# Patient Record
Sex: Male | Born: 1980
Health system: Southern US, Community
[De-identification: ages and names within clinical notes are randomized; demographics above are authoritative.]

## PROBLEM LIST (undated history)

## (undated) DIAGNOSIS — F1910 Other psychoactive substance abuse, uncomplicated: Secondary | ICD-10-CM

## (undated) DIAGNOSIS — F1020 Alcohol dependence, uncomplicated: Secondary | ICD-10-CM

## (undated) DIAGNOSIS — I10 Essential (primary) hypertension: Secondary | ICD-10-CM

## (undated) HISTORY — PX: DENTAL SURGERY: SHX609

---

## 2000-10-13 ENCOUNTER — Emergency Department (HOSPITAL_COMMUNITY): Admission: EM | Admit: 2000-10-13 | Discharge: 2000-10-13 | Payer: Self-pay | Admitting: Emergency Medicine

## 2008-11-02 ENCOUNTER — Emergency Department (HOSPITAL_COMMUNITY): Admission: EM | Admit: 2008-11-02 | Discharge: 2008-11-03 | Payer: Self-pay | Admitting: Emergency Medicine

## 2011-10-31 ENCOUNTER — Emergency Department (HOSPITAL_COMMUNITY)
Admission: EM | Admit: 2011-10-31 | Discharge: 2011-10-31 | Disposition: A | Discharge status: Home / Self Care | Payer: Self-pay | Attending: Emergency Medicine | Admitting: Emergency Medicine

## 2011-10-31 ENCOUNTER — Encounter (HOSPITAL_COMMUNITY): Payer: Self-pay | Admitting: Emergency Medicine

## 2011-10-31 DIAGNOSIS — Z23 Encounter for immunization: Secondary | ICD-10-CM | POA: Insufficient documentation

## 2011-10-31 DIAGNOSIS — Y93H2 Activity, gardening and landscaping: Secondary | ICD-10-CM | POA: Insufficient documentation

## 2011-10-31 DIAGNOSIS — S51809A Unspecified open wound of unspecified forearm, initial encounter: Secondary | ICD-10-CM | POA: Insufficient documentation

## 2011-10-31 DIAGNOSIS — W278XXA Contact with other nonpowered hand tool, initial encounter: Secondary | ICD-10-CM | POA: Insufficient documentation

## 2011-10-31 DIAGNOSIS — S51811A Laceration without foreign body of right forearm, initial encounter: Secondary | ICD-10-CM

## 2011-10-31 HISTORY — DX: Other psychoactive substance abuse, uncomplicated: F19.10

## 2011-10-31 MED ORDER — TETANUS-DIPHTH-ACELL PERTUSSIS 5-2.5-18.5 LF-MCG/0.5 IM SUSP
0.5000 mL | Freq: Once | INTRAMUSCULAR | Status: AC
  Administered 2011-10-31: 0.5 mL via INTRAMUSCULAR
  Filled 2011-10-31: qty 0.5

## 2011-10-31 NOTE — ED Provider Notes (Signed)
History     CSN: [id number]  Arrival date & time 10/31/11  1259   First MD Initiated Contact with Patient 10/31/11 1313      Chief Complaint  Patient presents with  . Extremity Laceration    (Consider location/radiation/quality/duration/timing/severity/associated sxs/prior treatment) HPI Comments: Patient presents with an extremity laceration of right forearm. 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 The sheers slipped out of is hand and cut his right forearm. The patient reports about 100cc of blood loss and mild pain to the area. He denies numbness/tingling, weakness or right arm. He denies lightheadedness, SOB, chest pain. The bleeding has stopped at this point and a pressure dressing was applied after the injury.    Past Medical History  Diagnosis Date  . Substance abuse     History reviewed. No pertinent past surgical history.  No family history on file.  History  Substance Use Topics  . Smoking status: Current Everyday Smoker  . Smokeless tobacco: Not on file  . Alcohol Use: No      Review of Systems  Skin: Positive for wound.  All other systems reviewed and are negative.    Allergies  Review of patient's allergies indicates no known allergies.  Home Medications  No current outpatient prescriptions on file.  BP 130/71  Pulse 81  Temp 99.4 F (37.4 C) (Oral)  Resp 16  Ht 5\' 10"  (1.778 m)  Wt 155 lb (70.308 kg)  BMI 22.24 kg/m2  SpO2 95%  Physical Exam  Nursing note and vitals reviewed. Constitutional: He is oriented to person, place, and time. He appears well-developed and well-nourished. No distress.  HENT:  Head: Normocephalic and atraumatic.  Eyes: Conjunctivae are normal. No scleral icterus.  Neck: Normal range of motion. Neck supple.  Cardiovascular: Normal rate and regular rhythm.   Pulmonary/Chest: Effort normal and breath sounds normal.  Musculoskeletal: Normal range of motion.  Neurological: He is alert and  oriented to person, place, and time. Coordination normal.       Upper extremity sensation and strength equal and intact bilaterally.   Skin: Skin is warm and dry. He is not diaphoretic.       1cm laceration of right volar aspect of forearm just distal to antecubital area.   Psychiatric: He has a normal mood and affect. His behavior is normal.    ED Course  Procedures (including critical care time)  LACERATION REPAIR Performed by: Emilia Beck Authorized by: Emilia Beck Consent: Verbal consent obtained. Risks and benefits: risks, benefits and alternatives were discussed Consent given by: patient Patient identity confirmed: provided demographic data Prepped and Draped in normal sterile fashion Wound explored  Laceration Location: right volar aspect of forearm, just distal to antecubital fossae  Laceration Length: 1.0 cm  No Foreign Bodies seen or palpated  Anesthesia: local infiltration  Local anesthetic: lidocaine 2% with epinephrine  Anesthetic total: 3.0 ml  Irrigation method: syringe Amount of cleaning: standard  Skin closure: 4.0 prolene  Number of sutures: 2  Technique: simple  Patient tolerance: Patient tolerated the procedure well with no immediate complications.   Labs Reviewed - No data to display No results found.   No diagnosis found.    MDM  3:17 PM Patient sutured without complications. He can be discharged after a tdap shot. He should return in 7 days for suture removal or sooner if signs of infection. No further evaluation needed at this time.         Emilia Beck, PA-C 11/02/11  2322 

## 2011-10-31 NOTE — ED Notes (Signed)
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  EMS reports a 2" lac on right forearm. Pt arrived with pressure dressing in place by EMS. EMS reports pt lost 100cc of blood.  Bleeding is controlled.  Pt denies numbness and tingling.  Pt reports pain 2/10.

## 2011-11-03 NOTE — ED Provider Notes (Signed)
Medical screening examination/treatment/procedure(s) were performed by non-physician practitioner and as supervising physician I was immediately available for consultation/collaboration. Devoria Albe, MD, Armando Gang   Ward Givens, MD 11/03/11 1501

## 2015-01-11 ENCOUNTER — Emergency Department (HOSPITAL_COMMUNITY)
Admission: EM | Admit: 2015-01-11 | Discharge: 2015-01-11 | Disposition: A | Discharge status: Home / Self Care | Payer: Self-pay | Attending: Emergency Medicine | Admitting: Emergency Medicine

## 2015-01-11 ENCOUNTER — Encounter (HOSPITAL_COMMUNITY): Payer: Self-pay

## 2015-01-11 DIAGNOSIS — F17210 Nicotine dependence, cigarettes, uncomplicated: Secondary | ICD-10-CM | POA: Insufficient documentation

## 2015-01-11 DIAGNOSIS — G5601 Carpal tunnel syndrome, right upper limb: Secondary | ICD-10-CM | POA: Insufficient documentation

## 2015-01-11 MED ORDER — IBUPROFEN 600 MG PO TABS: 600.0000 mg | ORAL_TABLET | Freq: Four times a day (QID) | ORAL | Status: DC | PRN

## 2015-01-11 NOTE — Discharge Instructions (Signed)

## 2015-01-11 NOTE — ED Provider Notes (Addendum)
CSN: [id number]     Arrival date & time 01/11/15  1735 History   First MD Initiated Contact with Patient 01/11/15 2037     Chief Complaint  Patient presents with  . Numbness     (Consider location/radiation/quality/duration/timing/severity/associated sxs/prior Treatment) The history is provided by the patient.   patient presents with pain and tingling in his right palm. 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 Over the last week she's been having more the numbness. Is on the palms hand and also some first and second finger. Feels like he slept on it. Worse in the morning. Has been getting worse last couple days. No other trauma. No neck pain. No weakness.  Past Medical History  Diagnosis Date  . Substance abuse    History reviewed. No pertinent past surgical history. History reviewed. No pertinent family history. Social History  Substance Use Topics  . Smoking status: Current Every Day Smoker -- 0.50 packs/day    Types: Cigarettes  . Smokeless tobacco: None  . Alcohol Use: Yes     Comment: occ    Review of Systems  Constitutional: Negative for fever.  Musculoskeletal: Negative for back pain and neck pain.  Neurological: Positive for numbness. Negative for weakness and light-headedness.      Allergies  Review of patient's allergies indicates no known allergies.  Home Medications   Prior to Admission medications   Medication Sig Start Date End Date Taking? Authorizing Provider  ibuprofen (ADVIL,MOTRIN) 600 MG tablet Take 1 tablet (600 mg total) by mouth every 6 (six) hours as needed. 01/11/15   Benjiman CoreNathan Zhyon Antenucci, MD   BP 180/110 mmHg  Pulse 92  Temp(Src) 98.7 F (37.1 C) (Oral)  Resp 16  SpO2 98% Physical Exam  Constitutional: He appears well-developed.  Musculoskeletal:   Thumb second and third finger. Worse with flexion at the wrist. Good strength. Otherwise strength intact on radial median and ulnar nerve distribution. No tenderness or elbow. No tenderness over neck.   Neurological: He is alert.  Skin: Skin is warm.    ED Course  Procedures (including critical care time) Labs Review Labs Reviewed - No data to display  Imaging Review No results found. I have personally reviewed and evaluated these images and lab results as part of my medical decision-making.   EKG Interpretation None      MDM   Final diagnoses:  Carpal tunnel syndrome on right    Patient with numbness in the hand. Likely carpal tunnel. We'll give splint and anti-inflammatory. Will have follow-up with Dr. Melvyn Novasrtmann or Dr. Asencion GowdaGramig  Lunna Vogelgesang, MD 01/11/15 2121  Patient now also has hypertension. No history of this. Will follow-up with primary care doctor. Doubt end organ damage.  Benjiman CoreNathan Sequoia Mincey, MD 01/11/15 2138

## 2015-01-11 NOTE — ED Notes (Signed)
Pt c/o R thumb, index, and middle finger numbness x 3 days.  Pain score 5/10.  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

## 2016-10-10 ENCOUNTER — Emergency Department (HOSPITAL_COMMUNITY): Payer: Self-pay

## 2016-10-10 ENCOUNTER — Encounter (HOSPITAL_COMMUNITY): Payer: Self-pay

## 2016-10-10 ENCOUNTER — Emergency Department (HOSPITAL_COMMUNITY)
Admission: EM | Admit: 2016-10-10 | Discharge: 2016-10-11 | Disposition: A | Discharge status: Home / Self Care | Payer: Self-pay | Attending: Emergency Medicine | Admitting: Emergency Medicine

## 2016-10-10 DIAGNOSIS — Z791 Long term (current) use of non-steroidal anti-inflammatories (NSAID): Secondary | ICD-10-CM | POA: Insufficient documentation

## 2016-10-10 DIAGNOSIS — Z79899 Other long term (current) drug therapy: Secondary | ICD-10-CM | POA: Insufficient documentation

## 2016-10-10 DIAGNOSIS — F17210 Nicotine dependence, cigarettes, uncomplicated: Secondary | ICD-10-CM | POA: Insufficient documentation

## 2016-10-10 DIAGNOSIS — I10 Essential (primary) hypertension: Secondary | ICD-10-CM | POA: Insufficient documentation

## 2016-10-10 DIAGNOSIS — R1013 Epigastric pain: Secondary | ICD-10-CM | POA: Insufficient documentation

## 2016-10-10 HISTORY — DX: Alcohol dependence, uncomplicated: F10.20

## 2016-10-10 LAB — BASIC METABOLIC PANEL
Anion gap: 14 (ref 5–15)
BUN: 5 mg/dL — ABNORMAL LOW (ref 6–20)
CO2: 25 mmol/L (ref 22–32)
Calcium: 9.1 mg/dL (ref 8.9–10.3)
Chloride: 97 mmol/L — ABNORMAL LOW (ref 101–111)
Creatinine, Ser: 0.93 mg/dL (ref 0.61–1.24)
GFR calc Af Amer: >60 mL/min (ref >60)
GFR calc non Af Amer: >60 mL/min (ref >60)
Glucose, Bld: 149 mg/dL — ABNORMAL HIGH (ref 65–99)
Potassium: 3.4 mmol/L — ABNORMAL LOW (ref 3.5–5.1)
Sodium: 136 mmol/L (ref 135–145)

## 2016-10-10 LAB — CBC
HCT: 45.2 % (ref 39.0–52.0)
Hemoglobin: 15.6 g/dL (ref 13.0–17.0)
MCH: 32.6 pg (ref 26.0–34.0)
MCHC: 34.5 g/dL (ref 30.0–36.0)
MCV: 94.4 fL (ref 78.0–100.0)
Platelets: 265 10*3/uL (ref 150–400)
RBC: 4.79 MIL/uL (ref 4.22–5.81)
RDW: 13.7 % (ref 11.5–15.5)
WBC: 14.8 10*3/uL — ABNORMAL HIGH (ref 4.0–10.5)

## 2016-10-10 LAB — LIPASE, BLOOD: Lipase: 46 U/L (ref 11–51)

## 2016-10-10 LAB — URINALYSIS, ROUTINE W REFLEX MICROSCOPIC
BACTERIA UA: NONE SEEN
BILIRUBIN URINE: NEGATIVE
Glucose, UA: NEGATIVE mg/dL
HGB URINE DIPSTICK: NEGATIVE
Ketones, ur: NEGATIVE mg/dL
NITRITE: NEGATIVE
Protein, ur: NEGATIVE mg/dL
SPECIFIC GRAVITY, URINE: 1.011 (ref 1.005–1.030)
pH: 6 (ref 5.0–8.0)

## 2016-10-10 LAB — I-STAT TROPONIN, ED: Troponin i, poc: 0 ng/mL (ref 0.00–0.08)

## 2016-10-10 LAB — HEPATIC FUNCTION PANEL
ALT: 89 U/L — ABNORMAL HIGH (ref 17–63)
AST: 124 U/L — ABNORMAL HIGH (ref 15–41)
Albumin: 4 g/dL (ref 3.5–5.0)
Alkaline Phosphatase: 74 U/L (ref 38–126)
BILIRUBIN DIRECT: 0.3 mg/dL (ref 0.1–0.5)
BILIRUBIN INDIRECT: 0.8 mg/dL (ref 0.3–0.9)
Total Bilirubin: 1.1 mg/dL (ref 0.3–1.2)
Total Protein: 7.8 g/dL (ref 6.5–8.1)

## 2016-10-10 LAB — ETHANOL

## 2016-10-10 LAB — RAPID URINE DRUG SCREEN, HOSP PERFORMED
Amphetamines: NOT DETECTED
Barbiturates: NOT DETECTED
Benzodiazepines: NOT DETECTED
COCAINE: NOT DETECTED
OPIATES: NOT DETECTED
TETRAHYDROCANNABINOL: NOT DETECTED

## 2016-10-10 IMAGING — DX DG CHEST 2V
2 series · 2 of 2 positions shown · non-contrast
Comparison: None.

CLINICAL DATA: Chest pain

EXAM:
CHEST  2 VIEW

[w chest pa]
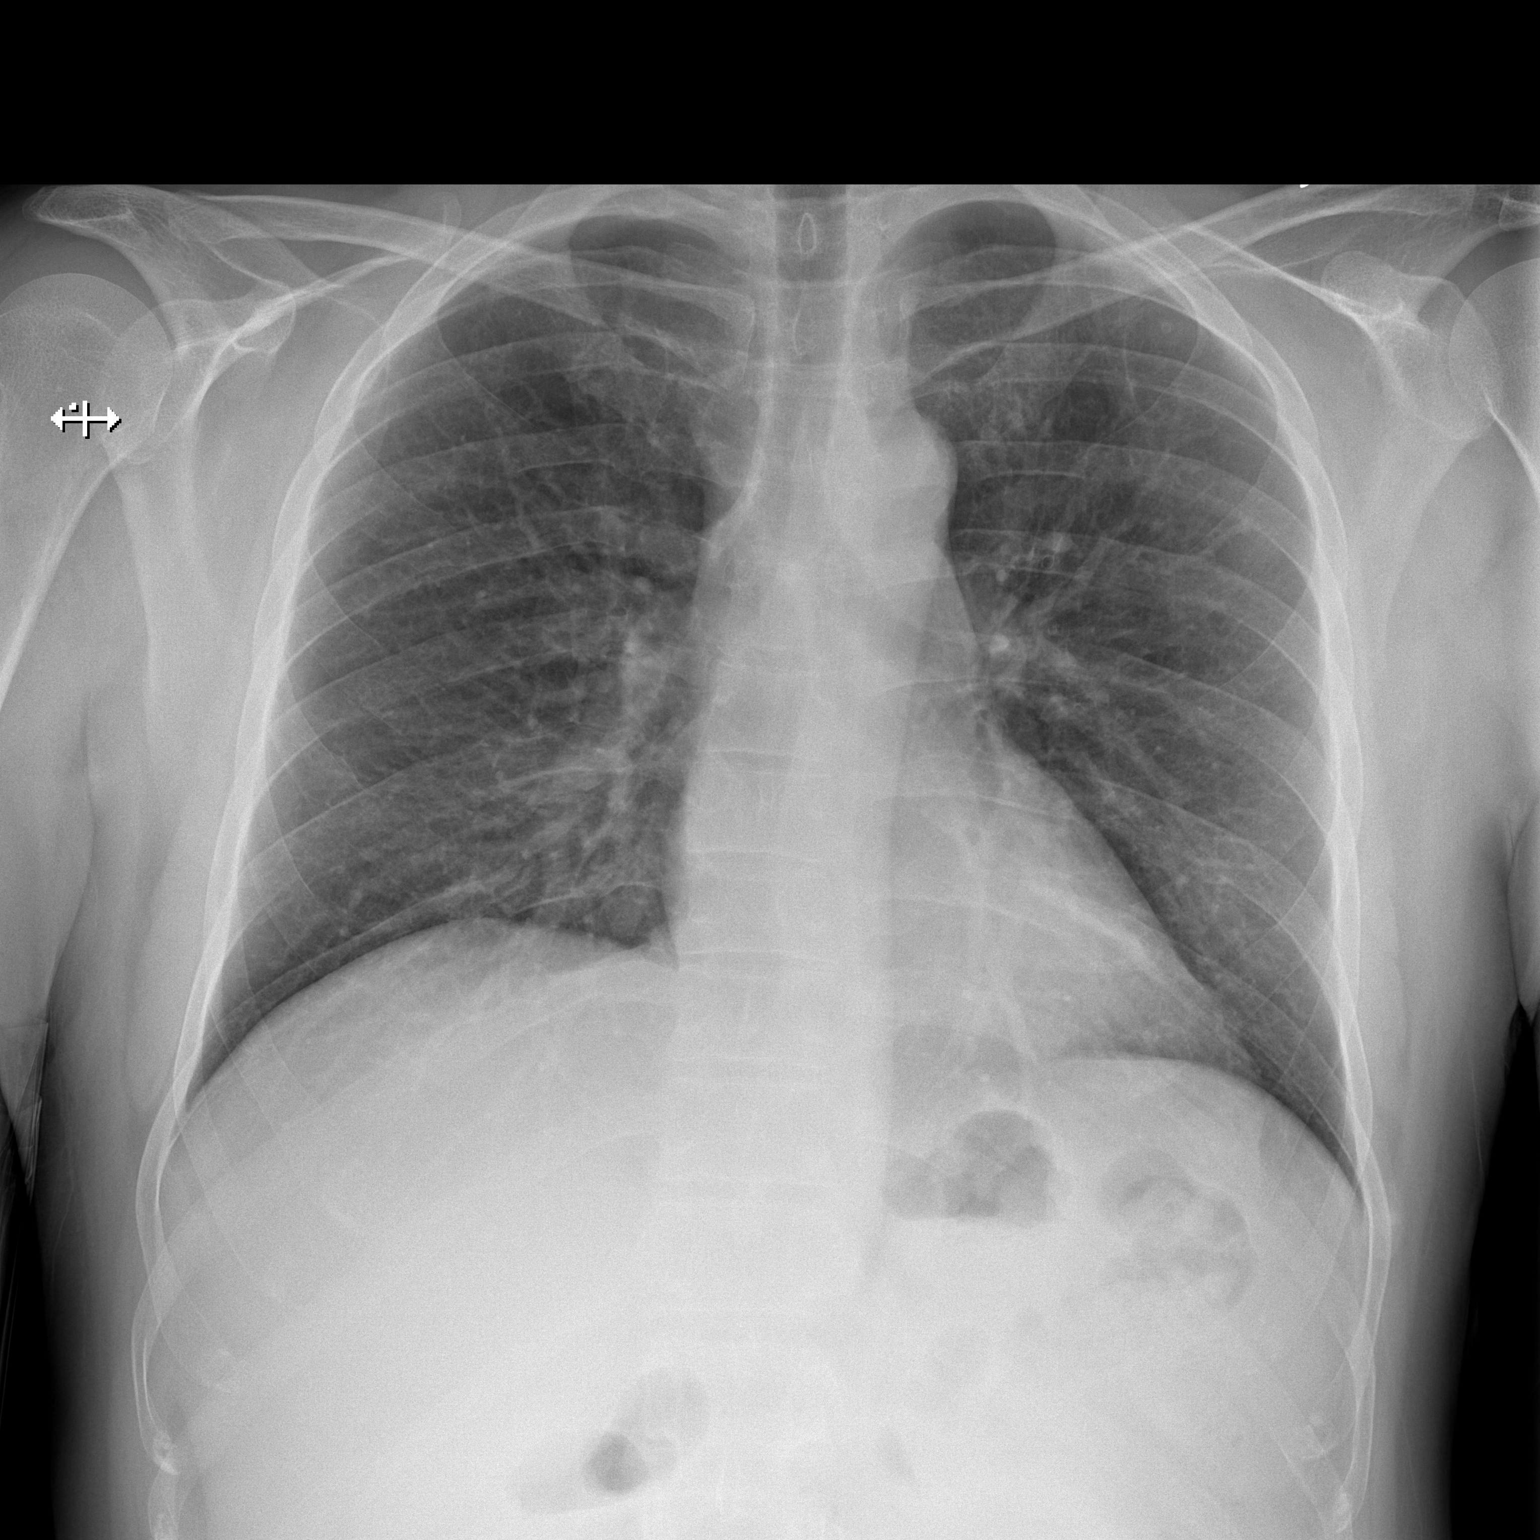

[w chest lat]
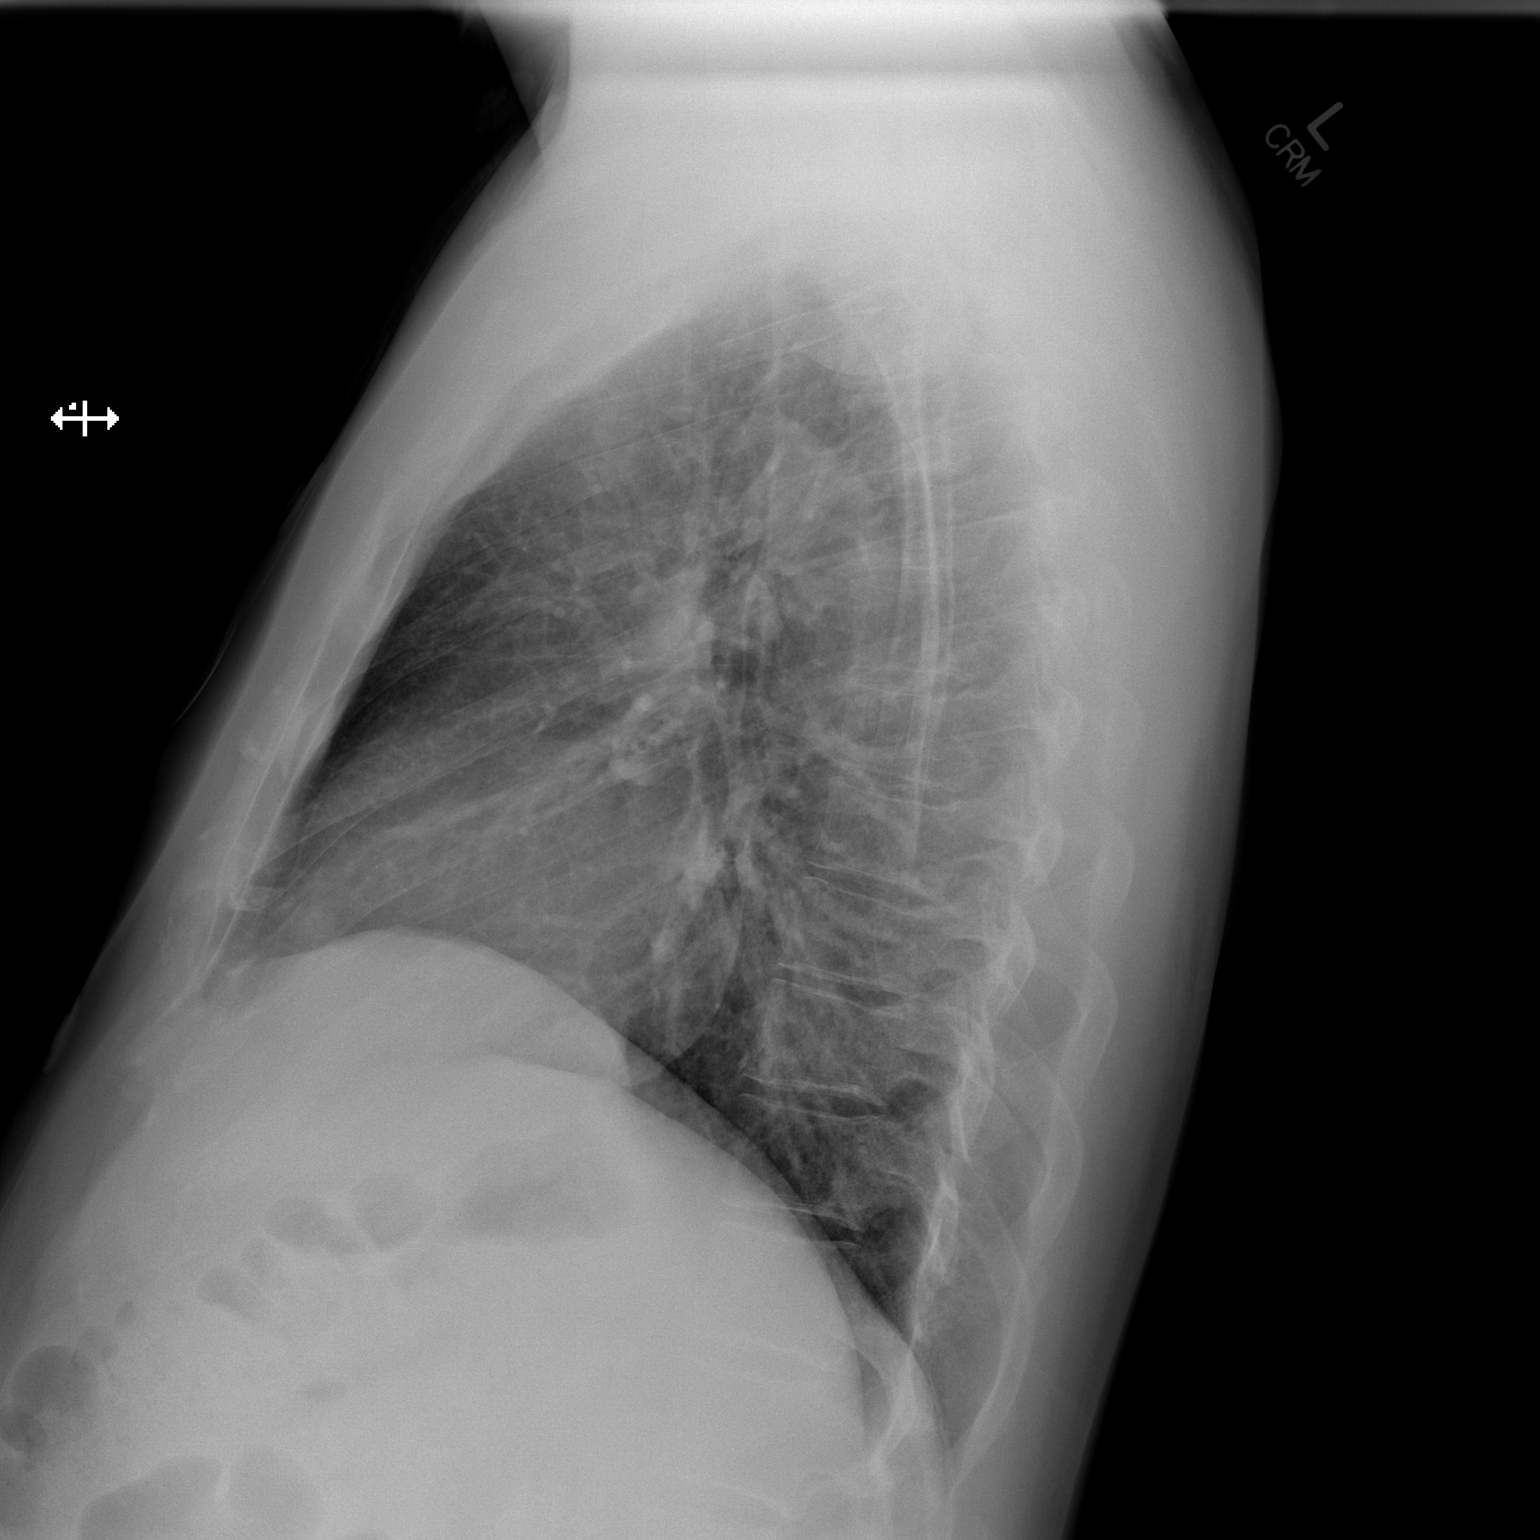

[2 of 2 positions shown; findings below may reference images not displayed]

FINDINGS: The heart size and mediastinal contours are within normal limits.
Both lungs are clear. The visualized skeletal structures are
unremarkable.
IMPRESSION: No active cardiopulmonary disease.

## 2016-10-10 MED ORDER — THIAMINE HCL 100 MG/ML IJ SOLN: 100.0000 mg | Freq: Every day | INTRAMUSCULAR | Status: DC

## 2016-10-10 MED ORDER — LORAZEPAM 1 MG PO TABS
0.0000 mg | ORAL_TABLET | Freq: Four times a day (QID) | ORAL | Status: DC
Start: 2016-10-10 — End: 2016-10-11
  Administered 2016-10-10: 1 mg via ORAL
  Filled 2016-10-10: qty 1

## 2016-10-10 MED ORDER — LORAZEPAM 2 MG/ML IJ SOLN: 0.0000 mg | Freq: Four times a day (QID) | INTRAMUSCULAR | Status: DC

## 2016-10-10 MED ORDER — HYDROCHLOROTHIAZIDE 25 MG PO TABS
25.0000 mg | ORAL_TABLET | Freq: Every day | ORAL | Status: DC
  Administered 2016-10-10: 25 mg via ORAL
  Filled 2016-10-10: qty 1

## 2016-10-10 MED ORDER — VITAMIN B-1 100 MG PO TABS
100.0000 mg | ORAL_TABLET | Freq: Every day | ORAL | Status: DC
  Administered 2016-10-11: 100 mg via ORAL

## 2016-10-10 MED ORDER — LORAZEPAM 2 MG/ML IJ SOLN: 0.0000 mg | Freq: Two times a day (BID) | INTRAMUSCULAR | Status: DC

## 2016-10-10 MED ORDER — LORAZEPAM 1 MG PO TABS: 0.0000 mg | ORAL_TABLET | Freq: Two times a day (BID) | ORAL | Status: DC

## 2016-10-10 NOTE — ED Triage Notes (Signed)
Onset yesterday chest pain, tingling, high blood pressure.  Pt drinks about fifth liquor a day, has not had anything to drink today.  On Methadone, last dose this morning.

## 2016-10-10 NOTE — ED Provider Notes (Signed)
[facility]-EMERGENCY DEPT Provider Note   CSN: [id number] Arrival date & time: 10/10/16  1409     History   Chief Complaint Chief Complaint  Patient presents with  . Chest Pain    HPI Peter [name] is a 36 y.o. male.  HPI   Peter [name] is a 36 y.o. male, with a history of Alcohol and substance abuse, presenting to the ED with an episode of lower chest pain/upper abdominal pain yesterday. Was driving yesterday and began to feel lightheaded, hot, and diaphoretic. Endorses upper abdominal pain that came on at the same time. Pain was dull and intermittent. Nausea and 2-3 episodes of vomiting. States this happens when he is withdrawing. Also took his blood pressure at home and found it to be high. Currently has no pain or complaints other than feeling "shaky." Usually drinks between a pint and a fifth of whiskey daily. Last alcohol was yesterday at 6 pm and this alleviated the above symptoms. Denies illicit drug use. Also takes 115mg  methadone daily with last dose this morning. Patient is accompanied by his father at the bedside.  Denies headache, fever/chills, shortness of breath, seizures, Hematemesis, hematochezia/melena, confusion, SI/HI, hallucinations, or any other complaints.   Past Medical History:  Diagnosis Date  . Alcoholism (HCC)   . Substance abuse     There are no active problems to display for this patient.   History reviewed. No pertinent surgical history.     Home Medications    Prior to Admission medications   Medication Sig Start Date End Date Taking? Authorizing Provider  DiphenhydrAMINE HCl, Sleep, (SLEEPING PO) Take 1 tablet by mouth at bedtime as needed (sleep).   Yes [provider]  ibuprofen (ADVIL,MOTRIN) 600 MG tablet Take 1 tablet (600 mg total) by mouth every 6 (six) hours as needed. 01/11/15  Yes Benjiman Core, MD  methadone (DOLOPHINE) 0.4 mg/mL SOLN Take 115 mg by mouth daily.   Yes [provider]  chlordiazePOXIDE  (LIBRIUM) 25 MG capsule 50mg  PO TID x 1D, then 25-50mg  PO BID X 1D, then 25-50mg  PO QD X 1D 10/11/16   Joy, Shawn C, PA-C  hydrochlorothiazide (HYDRODIURIL) 25 MG tablet Take 1 tablet (25 mg total) by mouth daily. 10/11/16   Joy, Shawn C, PA-C  ondansetron (ZOFRAN ODT) 4 MG disintegrating tablet Take 1 tablet (4 mg total) by mouth every 8 (eight) hours as needed for nausea or vomiting. 10/11/16   Joy, Hillard Danker, PA-C    Family History Family History  Problem Relation Age of Onset  . Hypertension Father   . Stroke Father   . CAD Father   . Cancer Paternal Grandfather        Lung  . Hyperlipidemia Paternal Grandfather   . Hypertension Paternal Grandfather     Social History Social History  Substance Use Topics  . Smoking status: Current Every Day Smoker    Packs/day: 0.50    Types: Cigarettes  . Smokeless tobacco: Never Used  . Alcohol use Yes     Comment: fifth a day whiskey     Allergies   Patient has no known allergies.   Review of Systems Review of Systems  Constitutional: Positive for diaphoresis (resolved). Negative for chills and fever.  Respiratory: Negative for shortness of breath.   Cardiovascular: Negative for chest pain.  Gastrointestinal: Positive for abdominal pain, nausea (resolved) and vomiting (resolved). Negative for blood in stool and diarrhea.  Neurological: Positive for light-headedness (resolved). Negative for seizures, syncope, weakness, numbness and headaches.  Psychiatric/Behavioral: Negative for confusion and hallucinations.  All other systems reviewed and are negative.    Physical Exam Updated Vital Signs BP (!) 199/141   Pulse (!) 102   Temp 97.7 F (36.5 C) (Oral)   Resp 20   SpO2 96%   Physical Exam  Constitutional: He appears well-developed and well-nourished. No distress.  HENT:  Head: Normocephalic and atraumatic.  Eyes: Conjunctivae are normal.  Neck: Neck supple.  Cardiovascular: Normal rate, regular rhythm, normal heart sounds  and intact distal pulses.   Pulmonary/Chest: Effort normal and breath sounds normal. No respiratory distress.  Abdominal: Soft. He exhibits no distension. There is no tenderness (Patient shows no reaction to palpation). There is no guarding and negative Murphy's sign.  Musculoskeletal: He exhibits no edema.  Lymphadenopathy:    He has no cervical adenopathy.  Neurological: He is alert.  Skin: Skin is warm and dry. He is not diaphoretic.  Psychiatric: He has a normal mood and affect. His behavior is normal.  Nursing note and vitals reviewed.    ED Treatments / Results  Labs (all labs ordered are listed, but only abnormal results are displayed) Labs Reviewed  BASIC METABOLIC PANEL - Abnormal; Notable for the following:       Result Value   Potassium 3.4 (*)    Chloride 97 (*)    Glucose, Bld 149 (*)    BUN 5 (*)    All other components within normal limits  CBC - Abnormal; Notable for the following:    WBC 14.8 (*)    All other components within normal limits  HEPATIC FUNCTION PANEL - Abnormal; Notable for the following:    AST 124 (*)    ALT 89 (*)    All other components within normal limits  URINALYSIS, ROUTINE W REFLEX MICROSCOPIC - Abnormal; Notable for the following:    Leukocytes, UA TRACE (*)    Squamous Epithelial / LPF 0-5 (*)    All other components within normal limits  ETHANOL  LIPASE, BLOOD  RAPID URINE DRUG SCREEN, HOSP PERFORMED  I-STAT TROPONIN, ED  I-STAT TROPONIN, ED    EKG  EKG Interpretation None       Radiology Dg Chest 2 View  Result Date: 10/10/2016 CLINICAL DATA:  Chest pain EXAM: CHEST  2 VIEW COMPARISON:  None. FINDINGS: The heart size and mediastinal contours are within normal limits. Both lungs are clear. The visualized skeletal structures are unremarkable. IMPRESSION: No active cardiopulmonary disease. Electronically Signed   By: Alcide Clever M.D.   On: 10/10/2016 14:55    Procedures Procedures (including critical care  time)  Medications Ordered in ED Medications  hydrochlorothiazide (HYDRODIURIL) tablet 25 mg (25 mg Oral Given 10/10/16 2254)  LORazepam (ATIVAN) injection 0-4 mg ( Intravenous See Alternative 10/10/16 2254)    Or  LORazepam (ATIVAN) tablet 0-4 mg (1 mg Oral Given 10/10/16 2254)  LORazepam (ATIVAN) injection 0-4 mg (not administered)    Or  LORazepam (ATIVAN) tablet 0-4 mg (not administered)  thiamine (VITAMIN B-1) tablet 100 mg (100 mg Oral Given 10/11/16 0005)    Or  thiamine (B-1) injection 100 mg ( Intravenous See Alternative 10/11/16 0005)  sodium chloride 0.9 % bolus 500 mL (500 mLs Intravenous New Bag/Given 10/11/16 0023)     Initial Impression / Assessment and Plan / ED Course  I have reviewed the triage vital signs and the nursing notes.  Pertinent labs & imaging results that were available during my care of the patient were reviewed by  me and considered in my medical decision making (see chart for details).  Clinical Course as of Oct 12 102  Tue Oct 10, 2016  2352 Patient states he feels much better. He denies additional symptoms or complaints. Discharge plan for librium taper, hydration, symptom management, and outpatient alcohol recovery was discussed. Patient voices understanding and is agreeable to the plan.  [SJ]    Clinical Course User Index [SJ] Joy, Shawn C, PA-C    Patient presents with epigastric pain beginning yesterday. Symptoms of mild alcohol withdrawal. Endorses an interest in alcoholism recovery. Patient was counseled on this matter. Patient does not present with signs of instability during his ED course. CIWA of 9 and was addressed.  Liver enzyme elevation noted. Patient states they have been elevated to a similar level in the past. We have no record of his previous values. He is not tender on exam. He is nontoxic appearing. He was informed he will need to have these values recheck in 2-3 days to assure they are not worsening.  The patient was given instructions  for home care as well as return precautions. Patient voices understanding of these instructions, accepts the plan, and is comfortable with discharge.  Findings and plan of care discussed with Raeford Razor, MD.   Vitals:   10/10/16 2100 10/10/16 2255 10/11/16 0000 10/11/16 0007  BP: (!) 194/129 (!) 169/133 (!) 171/121 (!) 168/98  Pulse: 84 98 87   Resp: 13 15 15    Temp:      TempSrc:      SpO2: 99% 97% 96%    Patient's hypertension was noted and addressed.    Final Clinical Impressions(s) / ED Diagnoses   Final diagnoses:  Epigastric pain  Hypertension, unspecified type    New Prescriptions New Prescriptions   CHLORDIAZEPOXIDE (LIBRIUM) 25 MG CAPSULE    50mg  PO TID x 1D, then 25-50mg  PO BID X 1D, then 25-50mg  PO QD X 1D   HYDROCHLOROTHIAZIDE (HYDRODIURIL) 25 MG TABLET    Take 1 tablet (25 mg total) by mouth daily.   ONDANSETRON (ZOFRAN ODT) 4 MG DISINTEGRATING TABLET    Take 1 tablet (4 mg total) by mouth every 8 (eight) hours as needed for nausea or vomiting.     Anselm Pancoast, PA-C 10/11/16 0118    Raeford Razor, MD 10/17/16 [phone]

## 2016-10-11 LAB — I-STAT TROPONIN, ED: TROPONIN I, POC: 0 ng/mL (ref 0.00–0.08)

## 2016-10-11 MED ORDER — SODIUM CHLORIDE 0.9 % IV BOLUS (SEPSIS)
500.0000 mL | Freq: Once | INTRAVENOUS | Status: AC
Start: 2016-10-11 — End: 2016-10-11
  Administered 2016-10-11: 500 mL via INTRAVENOUS

## 2016-10-11 MED ORDER — HYDROCHLOROTHIAZIDE 25 MG PO TABS: 25.0000 mg | ORAL_TABLET | Freq: Every day | ORAL | 0 refills | Status: DC

## 2016-10-11 MED ORDER — ONDANSETRON 4 MG PO TBDP: 4.0000 mg | ORAL_TABLET | Freq: Three times a day (TID) | ORAL | 0 refills | Status: DC | PRN

## 2016-10-11 MED ORDER — CHLORDIAZEPOXIDE HCL 25 MG PO CAPS: ORAL_CAPSULE | ORAL | 0 refills | Status: DC

## 2016-10-11 NOTE — ED Notes (Signed)
Gave pt sprite for po challenge

## 2016-10-11 NOTE — Discharge Instructions (Signed)
Please use the librium taper as prescribed and refrain from alcohol use. It is very important that you follow up with the outpatient alcohol counseling resources for assistance on your road to recovery. You should also follow up with a primary care provider for assistance with management of your recovering as well as you high blood pressure. Refer to the attached sheets for further information.  Your liver enzymes were noted to be higher than normal. Please have these rechecked in 2-3 days to assure they are not worsening. This can be done by returning to the ED or through a PCP.

## 2016-10-13 ENCOUNTER — Ambulatory Visit (INDEPENDENT_AMBULATORY_CARE_PROVIDER_SITE_OTHER): Payer: Self-pay | Admitting: Family Medicine

## 2016-10-13 ENCOUNTER — Encounter: Payer: Self-pay | Admitting: Family Medicine

## 2016-10-13 VITALS — BP 142/102 | HR 116 | Temp 98.5°F | Resp 16 | Ht 71.0 in | Wt 181.0 lb

## 2016-10-13 DIAGNOSIS — F10239 Alcohol dependence with withdrawal, unspecified: Secondary | ICD-10-CM

## 2016-10-13 DIAGNOSIS — I10 Essential (primary) hypertension: Secondary | ICD-10-CM

## 2016-10-13 DIAGNOSIS — F1110 Opioid abuse, uncomplicated: Secondary | ICD-10-CM

## 2016-10-13 DIAGNOSIS — Z114 Encounter for screening for human immunodeficiency virus [HIV]: Secondary | ICD-10-CM

## 2016-10-13 DIAGNOSIS — R748 Abnormal levels of other serum enzymes: Secondary | ICD-10-CM

## 2016-10-13 DIAGNOSIS — F419 Anxiety disorder, unspecified: Secondary | ICD-10-CM

## 2016-10-13 MED ORDER — VITAMIN B-1 100 MG PO TABS: 100.0000 mg | ORAL_TABLET | Freq: Every day | ORAL | 11 refills | Status: DC

## 2016-10-13 MED ORDER — FOLIC ACID 1 MG PO TABS: 1.0000 mg | ORAL_TABLET | Freq: Every day | ORAL | 11 refills | Status: DC

## 2016-10-13 MED ORDER — CLONIDINE HCL 0.1 MG PO TABS
0.1000 mg | ORAL_TABLET | Freq: Once | ORAL | Status: AC
  Administered 2016-10-13: 0.1 mg via ORAL

## 2016-10-13 MED ORDER — BUSPIRONE HCL 10 MG PO TABS: 10.0000 mg | ORAL_TABLET | Freq: Two times a day (BID) | ORAL | 5 refills | Status: DC

## 2016-10-13 MED ORDER — CLONIDINE HCL 0.1 MG PO TABS: 0.1000 mg | ORAL_TABLET | Freq: Every day | ORAL | 5 refills | Status: DC

## 2016-10-13 MED FILL — FOLIC ACID 1 MG TABLET: 1 | 30 days supply | Qty: 30 | Fill #0

## 2016-10-13 MED FILL — busPIRone HCL 10 MG TABS: 10 | 30 days supply | Qty: 60 | Fill #0

## 2016-10-13 MED FILL — cloNIDine HCL 0.1 MG TABS: 0.1 | 30 days supply | Qty: 30 | Fill #0

## 2016-10-13 NOTE — Progress Notes (Signed)
Subjective:    Patient ID: Peter [name], male    DOB: 04-Jul-1980, 36 y.o.   MRN: [id number]  HPI Peter [name], a 36 year old male that presents accompanied by father to establish care. He reports that he has not had a primary provider due to insurance constraints and has been using the emergency department. He was recently evaluated in the emergency department for elevated blood pressure and alcohol dependence. He was prescribed hydrochlorothiazide for hypertension and has been taking medication consistently. He does not follow a low sodium diet and does not exercise routinely.  Patient denies chest pain, dyspnea, irregular heart beat, orthopnea, palpitations, syncope and tachypnea.  Cardiovascular risk factors include: sedentary lifestyle and smoking/ tobacco exposure.  Patient complains of anxiety disorder.  He has the following symptoms: difficulty concentrating, feelings of losing control, irritable, palpitations, racing thoughts. Onset of symptoms was approximately several months ago, and has worsened since that time. He denies current suicidal and homicidal ideation. He typically drinks alcohol daily. He drinks a 5th of alcohol daily. He has been drinking heavily since he was a teenager. He drinks alcohol when he's sad, bored, or depressed. He often feels guilty about drinking, he gets angry when confronted by family pertaining to alcohol use, and often has an early morning eye opener. He has not had any alcohol over the past 3 days.   Patient has a 7 year history of methadone use. He is a patient at [facility] in [place], [place]. He is evaluated weekly at [facility]. He says that a counselor is available and he sees counselor regularly.   Past Medical History:  Diagnosis Date  . Alcoholism (HCC)   . Substance abuse    Social History   Social History  . Marital status: Single    Spouse name: N/A  . Number of children: N/A  . Years of education: N/A    Occupational History  . Not on file.   Social History Main Topics  . Smoking status: Current Every Day Smoker    Packs/day: 0.50    Types: Cigarettes  . Smokeless tobacco: Never Used  . Alcohol use Yes     Comment: fifth a day whiskey. nothing in 3 days (10/13/2016)  . Drug use: No  . Sexual activity: Not on file   Other Topics Concern  . Not on file   Social History Narrative  . No narrative on file   Immunization History  Administered Date(s) Administered  . Tdap 10/31/2011   No Known Allergies   Review of Systems  Constitutional: Negative for fatigue and fever.  Eyes: Negative for photophobia, pain and redness.  Respiratory: Negative.   Cardiovascular: Negative for chest pain, palpitations and leg swelling.  Gastrointestinal: Negative.   Endocrine: Negative for polydipsia, polyphagia and polyuria.  Genitourinary: Negative.   Musculoskeletal: Negative.   Skin: Negative.   Neurological: Negative.   Hematological: Negative.   Psychiatric/Behavioral: Positive for decreased concentration. Negative for agitation, behavioral problems, sleep disturbance and suicidal ideas. The patient is nervous/anxious.        Objective:   Physical Exam  Constitutional: He is oriented to person, place, and time.  HENT:  Head: Normocephalic and atraumatic.  Right Ear: External ear normal.  Left Ear: External ear normal.  Nose: Nose normal.  Mouth/Throat: Oropharynx is clear and moist.  Eyes: Pupils are equal, round, and reactive to light. Conjunctivae and EOM are normal.  Neck: Normal range of motion. Neck supple.  Pulmonary/Chest: Effort normal and breath sounds  normal.  Abdominal: Soft. Bowel sounds are normal.  Musculoskeletal: Normal range of motion.  Neurological: He is alert and oriented to person, place, and time. He displays tremor. He displays no atrophy. No cranial nerve deficit. He exhibits normal muscle tone. Coordination and gait normal.  Fine tremor to fingetips   Skin: Skin is warm and dry.  Psychiatric: His behavior is normal. Judgment and thought content normal. His mood appears not anxious. His speech is not rapid and/or pressured. He is not agitated. He does not exhibit a depressed mood.     Assessment & Plan:  1. Alcohol dependence with withdrawal with complication (HCC) Called and spoke with director at [facility] about alcohol detoxification.  Recommended avoiding benzodiazepines.  I will start a trial of Buspar 10 mg BID Patient is to follow up with Merri Brunette, patient's dedicated counselor - busPIRone (BUSPAR) 10 MG tablet; Take 1 tablet (10 mg total) by mouth 2 (two) times daily.  Dispense: 60 tablet; Refill: 5 - thiamine (VITAMIN B-1) 100 MG tablet; Take 1 tablet (100 mg total) by mouth daily.  Dispense: 30 tablet; Refill: 11 - folic acid (FOLVITE) 1 MG tablet; Take 1 tablet (1 mg total) by mouth daily.  Dispense: 30 tablet; Refill: 11  2. Essential hypertension Will discontinue hydrochlorothiazide and start a trial of Clonidine 0.1 mg daily for blood pressure.  - Continue medication, monitor blood pressure at home. Continue DASH diet. Reminder to go to the ER if any CP, SOB, nausea, dizziness, severe HA, changes vision/speech, left arm numbness and tingling and jaw pain. - cloNIDine (CATAPRES) tablet 0.1 mg; Take 1 tablet (0.1 mg total) by mouth once. - BASIC METABOLIC PANEL WITH GFR - cloNIDine (CATAPRES) 0.1 MG tablet; Take 1 tablet (0.1 mg total) by mouth daily.  Dispense: 30 tablet; Refill: 5 - Thyroid Panel With TSH  3. Elevated liver enzymes - Hepatitis panel, acute  4. Screening for HIV (human immunodeficiency virus) - HIV antibody (with reflex)  5. Methadone use disorder, mild, in controlled environment Continue to follow up with [facility] as scheduled  6. Anxiety GAD 7 : Generalized Anxiety Score 10/13/2016  Nervous, Anxious, on Edge 3  Control/stop worrying 3  Worry too much -  different things 2  Trouble relaxing 2  Restless 2  Easily annoyed or irritable 2  Afraid - awful might happen 0  Total GAD 7 Score 14  Anxiety Difficulty Somewhat difficult    - busPIRone (BUSPAR) 10 MG tablet; Take 1 tablet (10 mg total) by mouth 2 (two) times daily.  Dispense: 60 tablet; Refill: 5   RTC: 1 month for alcohol dependence and anxiety    [name]  MSN, FNP-C Patient [facility] [address] [place], [place] [address] [phone]

## 2016-10-13 NOTE — Patient Instructions (Signed)
Will start a trial of Clonidine 0.1 mg daily for hypertension. Blood pressure goal I is<140/90 Buspar 10 mg twice daily for anxiety. Please follow up with [facility] for alcohol dependence.  Thiamine 100 mg daily and folic acid 1 mg daily'   Will follow up by phone with any abnormal lab results   Clonidine tablets What is this medicine? CLONIDINE (KLOE ni deen) is used to treat high blood pressure. This medicine may be used for other purposes; ask your health care provider or pharmacist if you have questions. COMMON BRAND NAME(S): Catapres What should I tell my health care provider before I take this medicine? They need to know if you have any of these conditions: -kidney disease -an unusual or allergic reaction to clonidine, other medicines, foods, dyes, or preservatives -pregnant or trying to get pregnant -breast-feeding How should I use this medicine? Take this medicine by mouth with a glass of water. Follow the directions on the prescription label. Take your doses at regular intervals. Do not take your medicine more often than directed. Do not suddenly stop taking this medicine. You must gradually reduce the dose or you may get a dangerous increase in blood pressure. Ask your doctor or health care professional for advice. Talk to your pediatrician regarding the use of this medicine in children. Special care may be needed. Overdosage: If you think you have taken too much of this medicine contact a poison control center or emergency room at once. NOTE: This medicine is only for you. Do not share this medicine with others. What if I miss a dose? If you miss a dose, take it as soon as you can. If it is almost time for your next dose, take only that dose. Do not take double or extra doses. What may interact with this medicine? Do not take this medicine with any of the following medications: -MAOIs like Carbex, Eldepryl, Marplan, Nardil, and Parnate This medicine may also  interact with the following medications: -barbiturate medicines for inducing sleep or treating seizures like phenobarbital -certain medicines for blood pressure, heart disease, irregular heart beat -certain medicines for depression, anxiety, or psychotic disturbances -prescription pain medicines This list may not describe all possible interactions. Give your health care provider a list of all the medicines, herbs, non-prescription drugs, or dietary supplements you use. Also tell them if you smoke, drink alcohol, or use illegal drugs. Some items may interact with your medicine. What should I watch for while using this medicine? Visit your doctor or health care professional for regular checks on your progress. Check your heart rate and blood pressure regularly while you are taking this medicine. Ask your doctor or health care professional what your heart rate should be and when you should contact him or her. You may get drowsy or dizzy. Do not drive, use machinery, or do anything that needs mental alertness until you know how this medicine affects you. To avoid dizzy or fainting spells, do not stand or sit up quickly, especially if you are an older person. Alcohol can make you more drowsy and dizzy. Avoid alcoholic drinks. Your mouth may get dry. Chewing sugarless gum or sucking hard candy, and drinking plenty of water will help. Do not treat yourself for coughs, colds, or pain while you are taking this medicine without asking your doctor or health care professional for advice. Some ingredients may increase your blood pressure. If you are going to have surgery tell your doctor or health care professional that you are taking  this medicine. What side effects may I notice from receiving this medicine? Side effects that you should report to your doctor or health care professional as soon as possible: -allergic reactions like skin rash, itching or hives, swelling of the face, lips, or tongue -anxiety,  nervousness -chest pain -depression -fast, irregular heartbeat -swelling of feet or legs -unusually weak or tired Side effects that usually do not require medical attention (report to your doctor or health care professional if they continue or are bothersome): -change in sex drive or performance -constipation -headache This list may not describe all possible side effects. Call your doctor for medical advice about side effects. You may report side effects to FDA at 1-800-FDA-1088. Where should I keep my medicine? Keep out of the reach of children. Store at room temperature between 15 and 30 degrees C (59 and 86 degrees F). Protect from light. Keep container tightly closed. Throw away any unused medicine after the expiration date. NOTE: This sheet is a summary. It may not cover all possible information. If you have questions about this medicine, talk to your doctor, pharmacist, or health care provider.  2018 Elsevier/Gold Standard (2010-08-03 13:01:28)  DASH Eating Plan DASH stands for "Dietary Approaches to Stop Hypertension." The DASH eating plan is a healthy eating plan that has been shown to reduce high blood pressure (hypertension). It may also reduce your risk for type 2 diabetes, heart disease, and stroke. The DASH eating plan may also help with weight loss. What are tips for following this plan? General guidelines  Avoid eating more than 2,300 mg (milligrams) of salt (sodium) a day. If you have hypertension, you may need to reduce your sodium intake to 1,500 mg a day.  Limit alcohol intake to no more than 1 drink a day for nonpregnant women and 2 drinks a day for men. One drink equals 12 oz of beer, 5 oz of wine, or 1 oz of hard liquor.  Work with your health care provider to maintain a healthy body weight or to lose weight. Ask what an ideal weight is for you.  Get at least 30 minutes of exercise that causes your heart to beat faster (aerobic exercise) most days of the week.  Activities may include walking, swimming, or biking.  Work with your health care provider or diet and nutrition specialist (dietitian) to adjust your eating plan to your individual calorie needs. Reading food labels  Check food labels for the amount of sodium per serving. Choose foods with less than 5 percent of the Daily Value of sodium. Generally, foods with less than 300 mg of sodium per serving fit into this eating plan.  To find whole grains, look for the word "whole" as the first word in the ingredient list. Shopping  Buy products labeled as "low-sodium" or "no salt added."  Buy fresh foods. Avoid canned foods and premade or frozen meals. Cooking  Avoid adding salt when cooking. Use salt-free seasonings or herbs instead of table salt or sea salt. Check with your health care provider or pharmacist before using salt substitutes.  Do not fry foods. Cook foods using healthy methods such as baking, boiling, grilling, and broiling instead.  Cook with heart-healthy oils, such as olive, canola, soybean, or sunflower oil. Meal planning   Eat a balanced diet that includes: ? 5 or more servings of fruits and vegetables each day. At each meal, try to fill half of your plate with fruits and vegetables. ? Up to 6-8 servings of whole grains each  day. ? Less than 6 oz of lean meat, poultry, or fish each day. A 3-oz serving of meat is about the same size as a deck of cards. One egg equals 1 oz. ? 2 servings of low-fat dairy each day. ? A serving of nuts, seeds, or beans 5 times each week. ? Heart-healthy fats. Healthy fats called Omega-3 fatty acids are found in foods such as flaxseeds and coldwater fish, like sardines, salmon, and mackerel.  Limit how much you eat of the following: ? Canned or prepackaged foods. ? Food that is high in trans fat, such as fried foods. ? Food that is high in saturated fat, such as fatty meat. ? Sweets, desserts, sugary drinks, and other foods with added  sugar. ? Full-fat dairy products.  Do not salt foods before eating.  Try to eat at least 2 vegetarian meals each week.  Eat more home-cooked food and less restaurant, buffet, and fast food.  When eating at a restaurant, ask that your food be prepared with less salt or no salt, if possible. What foods are recommended? The items listed may not be a complete list. Talk with your dietitian about what dietary choices are best for you. Grains Whole-grain or whole-wheat bread. Whole-grain or whole-wheat pasta. Brown rice. Orpah Cobb. Bulgur. Whole-grain and low-sodium cereals. Pita bread. Low-fat, low-sodium crackers. Whole-wheat flour tortillas. Vegetables Fresh or frozen vegetables (raw, steamed, roasted, or grilled). Low-sodium or reduced-sodium tomato and vegetable juice. Low-sodium or reduced-sodium tomato sauce and tomato paste. Low-sodium or reduced-sodium canned vegetables. Fruits All fresh, dried, or frozen fruit. Canned fruit in natural juice (without added sugar). Meat and other protein foods Skinless chicken or [place]. Ground chicken or [place]. Pork with fat trimmed off. Fish and seafood. Egg whites. Dried beans, peas, or lentils. Unsalted nuts, nut butters, and seeds. Unsalted canned beans. Lean cuts of beef with fat trimmed off. Low-sodium, lean deli meat. Dairy Low-fat (1%) or fat-free (skim) milk. Fat-free, low-fat, or reduced-fat cheeses. Nonfat, low-sodium ricotta or cottage cheese. Low-fat or nonfat yogurt. Low-fat, low-sodium cheese. Fats and oils Soft margarine without trans fats. Vegetable oil. Low-fat, reduced-fat, or light mayonnaise and salad dressings (reduced-sodium). Canola, safflower, olive, soybean, and sunflower oils. Avocado. Seasoning and other foods Herbs. Spices. Seasoning mixes without salt. Unsalted popcorn and pretzels. Fat-free sweets. What foods are not recommended? The items listed may not be a complete list. Talk with your dietitian about what  dietary choices are best for you. Grains Baked goods made with fat, such as croissants, muffins, or some breads. Dry pasta or rice meal packs. Vegetables Creamed or fried vegetables. Vegetables in a cheese sauce. Regular canned vegetables (not low-sodium or reduced-sodium). Regular canned tomato sauce and paste (not low-sodium or reduced-sodium). Regular tomato and vegetable juice (not low-sodium or reduced-sodium). Rosita Fire. Olives. Fruits Canned fruit in a light or heavy syrup. Fried fruit. Fruit in cream or butter sauce. Meat and other protein foods Fatty cuts of meat. Ribs. Fried meat. Tomasa Blase. Sausage. Bologna and other processed lunch meats. Salami. Fatback. Hotdogs. Bratwurst. Salted nuts and seeds. Canned beans with added salt. Canned or smoked fish. Whole eggs or egg yolks. Chicken or [place] with skin. Dairy Whole or 2% milk, cream, and half-and-half. Whole or full-fat cream cheese. Whole-fat or sweetened yogurt. Full-fat cheese. Nondairy creamers. Whipped toppings. Processed cheese and cheese spreads. Fats and oils Butter. Stick margarine. Lard. Shortening. Ghee. Bacon fat. Tropical oils, such as coconut, palm kernel, or palm oil. Seasoning and other foods Salted popcorn and pretzels.  Onion salt, garlic salt, seasoned salt, table salt, and sea salt. Worcestershire sauce. Tartar sauce. Barbecue sauce. Teriyaki sauce. Soy sauce, including reduced-sodium. Steak sauce. Canned and packaged gravies. Fish sauce. Oyster sauce. Cocktail sauce. Horseradish that you find on the shelf. Ketchup. Mustard. Meat flavorings and tenderizers. Bouillon cubes. Hot sauce and Tabasco sauce. Premade or packaged marinades. Premade or packaged taco seasonings. Relishes. Regular salad dressings. Where to find more information:  National Heart, Lung, and Blood Institute: PopSteam.is  American Heart Association: www.heart.org Summary  The DASH eating plan is a healthy eating plan that has been shown to reduce  high blood pressure (hypertension). It may also reduce your risk for type 2 diabetes, heart disease, and stroke.  With the DASH eating plan, you should limit salt (sodium) intake to 2,300 mg a day. If you have hypertension, you may need to reduce your sodium intake to 1,500 mg a day.  When on the DASH eating plan, aim to eat more fresh fruits and vegetables, whole grains, lean proteins, low-fat dairy, and heart-healthy fats.  Work with your health care provider or diet and nutrition specialist (dietitian) to adjust your eating plan to your individual calorie needs. This information is not intended to replace advice given to you by your health care provider. Make sure you discuss any questions you have with your health care provider. Document Released: 01/26/2011 Document Revised: 01/31/2016 Document Reviewed: 01/31/2016 Elsevier Interactive Patient Education  2017 [employer].

## 2016-10-14 LAB — HIV ANTIBODY (ROUTINE TESTING W REFLEX): HIV 1&2 Ab, 4th Generation: NONREACTIVE

## 2016-10-14 LAB — BASIC METABOLIC PANEL WITH GFR
BUN: 12 mg/dL (ref 7–25)
CHLORIDE: 96 mmol/L — AB (ref 98–110)
CO2: 19 mmol/L — AB (ref 20–32)
CREATININE: 1.07 mg/dL (ref 0.60–1.35)
Calcium: 9.8 mg/dL (ref 8.6–10.3)
GFR, Est African American: >89 mL/min (ref >=60)
GFR, Est Non African American: 89 mL/min (ref >=60)
GLUCOSE: 96 mg/dL (ref 65–99)
POTASSIUM: 3.5 mmol/L (ref 3.5–5.3)
Sodium: 137 mmol/L (ref 135–146)

## 2016-10-14 LAB — HEPATITIS PANEL, ACUTE
HCV Ab: NONREACTIVE
HEP B S AG: NONREACTIVE
Hep A IgM: NONREACTIVE
Hep B C IgM: NONREACTIVE

## 2016-10-16 ENCOUNTER — Telehealth: Payer: Self-pay

## 2016-10-16 NOTE — Telephone Encounter (Signed)
-----   Message from Massie Maroon, [place] sent at 10/15/2016  2:27 PM EDT ----- Regarding: lab results Please inform Mr. Gasque that all laboratory values were within a normal range. Please return to office as previously scheduled.    Thanks ----- Message ----- From: Interface, Lab In [facility] Sent: 10/14/2016   5:15 AM To: Massie Maroon, FNP

## 2016-10-16 NOTE — Telephone Encounter (Signed)
Called, no answer, left a message advising of all labs within normal limits. Asked that patient keep next scheduled appointment and to call if any questions. Thanks!

## 2016-10-27 ENCOUNTER — Ambulatory Visit (INDEPENDENT_AMBULATORY_CARE_PROVIDER_SITE_OTHER): Payer: Self-pay | Admitting: Family Medicine

## 2016-10-27 VITALS — BP 136/88

## 2016-10-27 DIAGNOSIS — I10 Essential (primary) hypertension: Secondary | ICD-10-CM

## 2016-11-13 ENCOUNTER — Encounter: Payer: Self-pay | Admitting: Family Medicine

## 2016-11-13 ENCOUNTER — Ambulatory Visit (INDEPENDENT_AMBULATORY_CARE_PROVIDER_SITE_OTHER): Payer: Self-pay | Admitting: Family Medicine

## 2016-11-13 VITALS — BP 150/94 | HR 77 | Temp 97.8°F | Resp 16 | Ht 71.0 in | Wt 192.0 lb

## 2016-11-13 DIAGNOSIS — F1120 Opioid dependence, uncomplicated: Secondary | ICD-10-CM

## 2016-11-13 DIAGNOSIS — R8290 Unspecified abnormal findings in urine: Secondary | ICD-10-CM

## 2016-11-13 DIAGNOSIS — F10288 Alcohol dependence with other alcohol-induced disorder: Secondary | ICD-10-CM

## 2016-11-13 DIAGNOSIS — F1190 Opioid use, unspecified, uncomplicated: Secondary | ICD-10-CM

## 2016-11-13 DIAGNOSIS — I10 Essential (primary) hypertension: Secondary | ICD-10-CM

## 2016-11-13 LAB — COMPLETE METABOLIC PANEL WITH GFR
AG Ratio: 1.2 (calc) (ref 1.0–2.5)
ALBUMIN MSPROF: 4.6 g/dL (ref 3.6–5.1)
ALKALINE PHOSPHATASE (APISO): 81 U/L (ref 40–115)
ALT: 105 U/L — ABNORMAL HIGH (ref 9–46)
AST: 138 U/L — ABNORMAL HIGH (ref 10–40)
BUN: 13 mg/dL (ref 7–25)
CO2: 29 mmol/L (ref 20–32)
CREATININE: 1.16 mg/dL (ref 0.60–1.35)
Calcium: 9.9 mg/dL (ref 8.6–10.3)
Chloride: 94 mmol/L — ABNORMAL LOW (ref 98–110)
GFR, Est African American: 94 mL/min/{1.73_m2} (ref >=60)
GFR, Est Non African American: 81 mL/min/{1.73_m2} (ref >=60)
GLOBULIN: 3.7 g/dL (ref 1.9–3.7)
Glucose, Bld: 106 mg/dL — ABNORMAL HIGH (ref 65–99)
Potassium: 4 mmol/L (ref 3.5–5.3)
SODIUM: 137 mmol/L (ref 135–146)
Total Bilirubin: 0.8 mg/dL (ref 0.2–1.2)
Total Protein: 8.3 g/dL — ABNORMAL HIGH (ref 6.1–8.1)

## 2016-11-13 LAB — POCT URINALYSIS DIP (DEVICE)
Bilirubin Urine: NEGATIVE
GLUCOSE, UA: NEGATIVE mg/dL
HGB URINE DIPSTICK: NEGATIVE
Ketones, ur: NEGATIVE mg/dL
Leukocytes, UA: NEGATIVE
Nitrite: POSITIVE — AB
PROTEIN: NEGATIVE mg/dL
UROBILINOGEN UA: 0.2 mg/dL (ref 0.0–1.0)
pH: 5.5 (ref 5.0–8.0)

## 2016-11-13 MED ORDER — CLONIDINE HCL 0.1 MG PO TABS: 0.1000 mg | ORAL_TABLET | Freq: Every day | ORAL | 5 refills | Status: DC

## 2016-11-13 MED ORDER — LOSARTAN POTASSIUM 25 MG PO TABS: 25.0000 mg | ORAL_TABLET | Freq: Every day | ORAL | 5 refills | Status: DC

## 2016-11-13 MED FILL — FOLIC ACID 1 MG TABLET: 1 | 30 days supply | Qty: 30 | Fill #1

## 2016-11-13 NOTE — Patient Instructions (Addendum)
Called multiple residential facilities. Will call as information becomes available [facility]  [address] [place], [place] [phone]  Essential hypertension:  Will add Losartan 25 mg daily to medication regimen Will continue Clonidine 0.1 mg daily  Return in 1 week for blood pressure check     Addiction and the Family What is addiction? Addiction is a complex disease of the brain. It causes an uncontrollable (compulsive) need for a substance. You can be addicted to alcohol or illegal drugs or to prescription medicines, such as painkillers. Addiction can also be a behavior, like gambling or shopping. The need for the drug or activity can become so strong that you think about it all the time. You can also become physically dependent on a substance. Addiction can change the way your brain works. Because of these changes, getting more of whatever you are addicted to becomes the most important thing to you and feels better than other activities or relationships. Addiction can lead to changes in health, behavior, emotions, relationships, and choices that affect you and everyone around you. What are common signs of addiction? Addiction can cause behavioral and emotional signs, as well as physical signs. Behavioral signs of addiction may include:  Having an intense craving for whatever you are addicted to.  Always thinking about your addiction.  Planning your life around your addiction.  Being unable to stop using a substance or participating in a behavior.  Devoting more time to the addiction. This might mean you no longer go to school or work or spend time with people you enjoy.  Having an increasing need for money. An addiction might make you ask people for unusual loans or steal items to sell.  Having exaggerated emotional responses to difficult situations. These may include: ? Feeling anxious or stressed out. ? Extreme  irritability. ? Aggression. ? Lying.  Continuing with the addiction even after it has caused a bad outcome, such as: ? Poor health. ? Damaged relationships. ? Money loss. ? Job loss. ? Getting hurt or arrested.  Having trouble being realistic about the negative effects of addiction.  Physical signs of addiction may include:  Bloodshot eyes.  Nosebleeds.  Poor hygiene.  Changing sleep patterns.  Shakes and tremors.  Slurred speech.  Confusion.  Unconsciousness.  How can addiction affect family members? Addiction affects everyone in a family. It touches all aspects of family life, including finances, communication, work, school, and free time. Children of an addict might have:  Birth defects, if the mother was addicted during pregnancy.  Physical, emotional, and behavioral problems.  Trouble in school.  Injury from violence, abuse, or accidents.  Problems because of neglect.  A greater risk for addiction later in life.  Parents of an addict might experience:  Confusion.  Worry.  Guilt.  Fear.  Sadness.  A desire to make the addiction seem less of a problem than it is.  Financial strain.  Feeling isolated from family and friends.  Physical health changes from stress or from violent confrontations.  Brothers, sisters, and extended family members of an addict might experience:  Worry.  Anger.  Fear.  Resentment.  Confusion.  A desire to hide the addiction and its impact.  Financial strain.  How do I know if treatment for addiction is needed? Addiction is a progressive disease. Without treatment, addiction can get worse. Living with addiction puts you at higher risk for injury, poor health, lost employment, loss of money, and even death. You might need treatment  for addiction if:  You have tried to stop or cut down, and you cannot.  Your addiction is causing physical health problems.  You find it annoying that your friends and  family are concerned about your alcohol or substance use.  You feel guilty about substance abuse or a behavior.  You have lied or tried to hide your addiction.  You need a particular substance to start your day or calm down.  You are getting in trouble at school, work, home, or with the police.  You have done something illegal to support your addiction.  You are running out of money because of your addiction.  You have no time for anything other than your addiction.  What types of treatment options are available?  Treatment for the addict The treatment program that is right for you will depend on many factors, including the type of addiction you have. Treatment programs can be outpatient or inpatient. In an outpatient program, you live at home and go to work but also go to a clinic for treatment. With an inpatient program, you sleep and live at the program facility during treatment. After treatment, you might need a plan for support during recovery. Other treatment options include:  Medicine. ? Some addictions may be treated with prescription medicines. ? You might also need medicine to treat anxiety or depression.  Counseling and behavior therapy. Therapy can help individuals and families behave and relate more effectively.  Support groups. Confidential group therapy, such as twelve-step program, can help individuals and families during treatment and recovery.  Treatment for family members Addiction affects the entire family. Ask your health care provider or counselor to recommend resources for family members. You can call [employer] at ([phone] or visit their website at http://www.nar-anon.org/find-a-group/ Where else can I get help?  Ask your health care provider for help finding addiction treatment. These discussions are confidential.  The [employer] on Alcoholism and Drug Dependence (NCADD). This group has information on treatment centers and programs for people who  have an addiction and for family members. ? Their telephone number is 1-800-NCA-CALL. ? Their website is https://ncadd.org/affiliate-network/find-an-affiliate  The Substance Abuse and Mental Health Services Administration [facility]). This group will help you find publicly funded treatment centers, help hotlines, and counseling services near you. ? Their telephone number is 1-800-662-HELP (4357). ? Their website is www.findtreatment.RockToxic.pl This information is not intended to replace advice given to you by your health care provider. Make sure you discuss any questions you have with your health care provider. Document Released: 10/13/2003 Document Revised: 01/24/2016 Document Reviewed: 04/28/2013 Elsevier Interactive Patient Education  2017 [employer].

## 2016-11-13 NOTE — Progress Notes (Signed)
Subjective:    Patient ID: Peter [name], male    DOB: 1980/10/06, 36 y.o.   MRN: [id number]  HPI Peter [name], a 36 year old male that presents accompanied by father for a 1 month follow up. Patient continues to drink a 5th of vodka everyday. He says that he does not want to drink, but he cannot stop. He feels guilty about drinking and feels that he is hurting his family. He is currently a patient of [facility] daily and methadone use has decreased to 115 mg per day. He sees a Veterinary surgeon at [facility] in [place]. He is requesting a referral to an inpatient facility for alcohol dependence.  Patient complains of anxiety disorder.  He has the following symptoms: difficulty concentrating, feelings of losing control, irritable, palpitations, racing thoughts. Onset of symptoms was approximately several months ago, and has worsened since that time. He denies current suicidal and homicidal ideation. He typically drinks alcohol daily. He has been drinking heavily since he was a teenager. He drinks alcohol when he's sad, bored, or depressed. He often feels guilty about drinking, he gets angry when confronted by family pertaining to alcohol use, and often has an early morning eye opener. His last drink was on last night.  Patient is also follow up up for hypertension. He has been out of medication over the past 4 days. He  denies chest pain, dyspnea, irregular heart beat, orthopnea, palpitations, syncope and tachypnea.  Cardiovascular risk factors include: sedentary lifestyle and smoking/ tobacco exposure.     Past Medical History:  Diagnosis Date  . Alcoholism (HCC)   . Substance abuse    Social History   Social History  . Marital status: Single    Spouse name: N/A  . Number of children: N/A  . Years of education: N/A   Occupational History  . Not on file.   Social History Main Topics  . Smoking status: Current Every Day Smoker    Packs/day: 0.50   Types: Cigarettes  . Smokeless tobacco: Never Used  . Alcohol use Yes     Comment: fifth a day whiskey. nothing in 3 days (10/13/2016)  . Drug use: No  . Sexual activity: Not on file   Other Topics Concern  . Not on file   Social History Narrative  . No narrative on file   Immunization History  Administered Date(s) Administered  . Tdap 10/31/2011   No Known Allergies   Review of Systems  Constitutional: Negative for fatigue.  Eyes: Negative for photophobia, pain and redness.  Respiratory: Negative.   Cardiovascular: Negative for leg swelling.  Gastrointestinal: Negative.   Endocrine: Negative for polydipsia, polyphagia and polyuria.  Genitourinary: Negative.   Musculoskeletal: Negative.   Skin: Negative.   Neurological: Negative.   Hematological: Negative.   Psychiatric/Behavioral: Positive for agitation. Negative for behavioral problems and sleep disturbance. The patient is nervous/anxious.        Objective:   Physical Exam  Constitutional: He is oriented to person, place, and time.  HENT:  Head: Normocephalic and atraumatic.  Right Ear: External ear normal.  Left Ear: External ear normal.  Nose: Nose normal.  Mouth/Throat: Oropharynx is clear and moist.  Eyes: Pupils are equal, round, and reactive to light. Conjunctivae and EOM are normal.  Neck: Normal range of motion. Neck supple.  Pulmonary/Chest: Effort normal and breath sounds normal.  Abdominal: Soft. Bowel sounds are normal.  Musculoskeletal: Normal range of motion.  Neurological: He is alert and oriented to  person, place, and time. He displays tremor. He displays no atrophy. No cranial nerve deficit. He exhibits normal muscle tone. Coordination and gait normal.  Fine tremor to fingetips  Skin: Skin is warm and dry.  Psychiatric: His behavior is normal. Judgment and thought content normal. His mood appears anxious. His speech is not rapid and/or pressured. He is not agitated. He does not exhibit a  depressed mood. He expresses no homicidal and no suicidal ideation.    BP (!) 162/108 (BP Location: Left Arm, Patient Position: Sitting, Cuff Size: Large) Comment: manually  Pulse 77   Temp 97.8 F (36.6 C) (Oral)   Resp 16   Ht  (1.803 m)   Wt 192 lb (87.1 kg)   SpO2 99%   BMI 26.78 kg/m   Assessment & Plan:  1. Alcohol dependence with withdrawal with complication (HCC) Called and spoke with director at [facility] about alcohol detoxification.  Recommended avoiding benzodiazepines.  I will continue Buspar 10 mg BID I called multiple inpatient facilities for alcohol use. 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  Called [facility] in [place]. Counselor says that they are willing to accept patient. Unable to speak with program coordinator while the patient is here in the office. I left a message and will contact the patient at a later time.   Patient is to follow up with Merri Brunette on 11/14/2016.  Patient's father expressed his frustration about not being able to find a facility quickly. I explained that [facility] does not have a payer source and it is quite difficult to find facilities that can manage patient's daily  Methadone and alcohol abuse. Discussed treatment options at length.   I also recommend that [facility] start with [facility] [address] [place], [place] in clinic daily  Recommend that patient follow up in the emergency department if condition worsens.    2. Methadone use (HCC) Continue to follow up with [facility] daily as scheduled.   3. Essential hypertension  Continue medication, monitor blood pressure at home. Continue DASH diet. Reminder to go to the ER if any CP, SOB, nausea, dizziness, severe HA, changes vision/speech, left arm numbness and tingling and jaw pain.  - cloNIDine (CATAPRES) 0.1 MG tablet; Take 1 tablet (0.1 mg total) by mouth daily.  Dispense:  30 tablet; Refill: 5 - losartan (COZAAR) 25 MG tablet; Take 1 tablet (25 mg total) by mouth daily.  Dispense: 30 tablet; Refill: 5 - COMPLETE METABOLIC PANEL WITH GFR - POCT urinalysis dip (device)  4. Abnormal urinalysis Positive nitrites, will order urine culture - Urine Culture - POCT urinalysis dip (device)    50% of visit spent counseling and calling facilities for inpatient treatment centers.    RTC: 1 month for hypertension   [name]  MSN, FNP-C Patient Care [facility] [address] [place], [place] [address] [phone]

## 2016-11-14 ENCOUNTER — Other Ambulatory Visit: Payer: Self-pay | Admitting: Family Medicine

## 2016-11-14 DIAGNOSIS — R748 Abnormal levels of other serum enzymes: Secondary | ICD-10-CM | POA: Insufficient documentation

## 2016-11-14 LAB — URINE CULTURE
MICRO NUMBER: 81054106
SPECIMEN QUALITY: ADEQUATE

## 2016-11-14 NOTE — Progress Notes (Signed)
Peter [name], a 36 year old male with a history of alcohol dependence and methadone use. Liver enzymes are markedly elevated on reviewing lab results. Previous hepatitis panel was negative. Will order abdominal ultrasound for further evaluation of elevated liver enzymes.    [name]  MSN, FNP-C Patient Care [facility] [address] [place], [place] [address] [phone]

## 2016-11-14 NOTE — Progress Notes (Signed)
Called, no answer. Left a message for patient to call back.   I also need to advise him that Ultrasound has been scheduled for Friday 11/17/2016@10 :30am. He needs to arrive 15 minutes early and be NPO. Thanks!

## 2016-11-15 MED FILL — busPIRone HCL 10 MG TABS: 10 | 30 days supply | Qty: 60 | Fill #1

## 2016-11-15 NOTE — Progress Notes (Signed)
Called and left a message for patient to call back regarding lab results. Thanks!

## 2016-11-16 MED FILL — LOSARTAN POTASSIUM 25 MG TA: 25 | 30 days supply | Qty: 30 | Fill #0

## 2016-11-16 NOTE — Progress Notes (Signed)
Called, no answer. Left a message for patient to call back asap. Thanks!

## 2016-11-16 NOTE — Progress Notes (Signed)
Since I have tried multiple times to reach patient with no success, I will mail letter advising patient of labs and that ultrasound has been canceled at this time. Thanks!

## 2016-11-17 ENCOUNTER — Ambulatory Visit (HOSPITAL_COMMUNITY): Payer: Self-pay

## 2016-11-18 DIAGNOSIS — F10288 Alcohol dependence with other alcohol-induced disorder: Secondary | ICD-10-CM | POA: Insufficient documentation

## 2016-11-18 DIAGNOSIS — I10 Essential (primary) hypertension: Secondary | ICD-10-CM | POA: Insufficient documentation

## 2016-11-18 DIAGNOSIS — F1120 Opioid dependence, uncomplicated: Secondary | ICD-10-CM | POA: Insufficient documentation

## 2016-11-18 DIAGNOSIS — F1190 Opioid use, unspecified, uncomplicated: Secondary | ICD-10-CM | POA: Insufficient documentation

## 2016-12-02 ENCOUNTER — Inpatient Hospital Stay (HOSPITAL_COMMUNITY)
Admission: EM | Admit: 2016-12-02 | Discharge: 2016-12-07 | DRG: 897 | Disposition: A | Discharge status: Home / Self Care | Payer: Self-pay | Attending: Internal Medicine | Admitting: Internal Medicine

## 2016-12-02 ENCOUNTER — Encounter (HOSPITAL_COMMUNITY): Payer: Self-pay | Admitting: Emergency Medicine

## 2016-12-02 DIAGNOSIS — R451 Restlessness and agitation: Secondary | ICD-10-CM | POA: Diagnosis not present

## 2016-12-02 DIAGNOSIS — R000 Tachycardia, unspecified: Secondary | ICD-10-CM | POA: Diagnosis present

## 2016-12-02 DIAGNOSIS — F10930 Alcohol use, unspecified with withdrawal, uncomplicated: Secondary | ICD-10-CM

## 2016-12-02 DIAGNOSIS — F10230 Alcohol dependence with withdrawal, uncomplicated: Principal | ICD-10-CM | POA: Diagnosis present

## 2016-12-02 DIAGNOSIS — E860 Dehydration: Secondary | ICD-10-CM | POA: Diagnosis present

## 2016-12-02 DIAGNOSIS — Z79891 Long term (current) use of opiate analgesic: Secondary | ICD-10-CM

## 2016-12-02 DIAGNOSIS — I10 Essential (primary) hypertension: Secondary | ICD-10-CM | POA: Diagnosis present

## 2016-12-02 DIAGNOSIS — E871 Hypo-osmolality and hyponatremia: Secondary | ICD-10-CM | POA: Diagnosis present

## 2016-12-02 DIAGNOSIS — R748 Abnormal levels of other serum enzymes: Secondary | ICD-10-CM | POA: Diagnosis present

## 2016-12-02 DIAGNOSIS — Z8249 Family history of ischemic heart disease and other diseases of the circulatory system: Secondary | ICD-10-CM

## 2016-12-02 DIAGNOSIS — Z79899 Other long term (current) drug therapy: Secondary | ICD-10-CM

## 2016-12-02 DIAGNOSIS — F1120 Opioid dependence, uncomplicated: Secondary | ICD-10-CM

## 2016-12-02 DIAGNOSIS — Y906 Blood alcohol level of 120-199 mg/100 ml: Secondary | ICD-10-CM | POA: Diagnosis present

## 2016-12-02 DIAGNOSIS — F419 Anxiety disorder, unspecified: Secondary | ICD-10-CM | POA: Diagnosis present

## 2016-12-02 DIAGNOSIS — F17210 Nicotine dependence, cigarettes, uncomplicated: Secondary | ICD-10-CM | POA: Diagnosis present

## 2016-12-02 DIAGNOSIS — E876 Hypokalemia: Secondary | ICD-10-CM | POA: Diagnosis present

## 2016-12-02 DIAGNOSIS — I959 Hypotension, unspecified: Secondary | ICD-10-CM | POA: Diagnosis present

## 2016-12-02 DIAGNOSIS — F1190 Opioid use, unspecified, uncomplicated: Secondary | ICD-10-CM | POA: Diagnosis present

## 2016-12-02 DIAGNOSIS — F10239 Alcohol dependence with withdrawal, unspecified: Secondary | ICD-10-CM | POA: Diagnosis present

## 2016-12-02 DIAGNOSIS — F10229 Alcohol dependence with intoxication, unspecified: Secondary | ICD-10-CM | POA: Diagnosis present

## 2016-12-02 DIAGNOSIS — G8929 Other chronic pain: Secondary | ICD-10-CM | POA: Diagnosis present

## 2016-12-02 DIAGNOSIS — F1110 Opioid abuse, uncomplicated: Secondary | ICD-10-CM | POA: Diagnosis present

## 2016-12-02 DIAGNOSIS — D72829 Elevated white blood cell count, unspecified: Secondary | ICD-10-CM | POA: Diagnosis present

## 2016-12-02 DIAGNOSIS — F10939 Alcohol use, unspecified with withdrawal, unspecified: Secondary | ICD-10-CM | POA: Diagnosis present

## 2016-12-02 LAB — CBC
HCT: 50 % (ref 39.0–52.0)
Hemoglobin: 17.5 g/dL — ABNORMAL HIGH (ref 13.0–17.0)
MCH: 33.1 pg (ref 26.0–34.0)
MCHC: 35 g/dL (ref 30.0–36.0)
MCV: 94.5 fL (ref 78.0–100.0)
PLATELETS: 276 10*3/uL (ref 150–400)
RBC: 5.29 MIL/uL (ref 4.22–5.81)
RDW: 12.7 % (ref 11.5–15.5)
WBC: 12 10*3/uL — AB (ref 4.0–10.5)

## 2016-12-02 LAB — COMPREHENSIVE METABOLIC PANEL
ALK PHOS: 78 U/L (ref 38–126)
ALT: 117 U/L — AB (ref 17–63)
AST: 154 U/L — AB (ref 15–41)
Albumin: 4.3 g/dL (ref 3.5–5.0)
Anion gap: 14 (ref 5–15)
BILIRUBIN TOTAL: 0.9 mg/dL (ref 0.3–1.2)
BUN: 5 mg/dL — AB (ref 6–20)
CALCIUM: 9.4 mg/dL (ref 8.9–10.3)
CHLORIDE: 94 mmol/L — AB (ref 101–111)
CO2: 24 mmol/L (ref 22–32)
CREATININE: 0.96 mg/dL (ref 0.61–1.24)
Glucose, Bld: 112 mg/dL — ABNORMAL HIGH (ref 65–99)
Potassium: 3.3 mmol/L — ABNORMAL LOW (ref 3.5–5.1)
Sodium: 132 mmol/L — ABNORMAL LOW (ref 135–145)
TOTAL PROTEIN: 8.3 g/dL — AB (ref 6.5–8.1)

## 2016-12-02 LAB — ETHANOL: ALCOHOL ETHYL (B): 130 mg/dL — AB (ref <10)

## 2016-12-02 LAB — RAPID URINE DRUG SCREEN, HOSP PERFORMED
Amphetamines: NOT DETECTED
BARBITURATES: NOT DETECTED
BENZODIAZEPINES: NOT DETECTED
COCAINE: NOT DETECTED
OPIATES: NOT DETECTED
Tetrahydrocannabinol: NOT DETECTED

## 2016-12-02 MED ORDER — ACETAMINOPHEN 650 MG RE SUPP: 650.0000 mg | Freq: Four times a day (QID) | RECTAL | Status: DC | PRN

## 2016-12-02 MED ORDER — ONDANSETRON HCL 4 MG PO TABS: 4.0000 mg | ORAL_TABLET | Freq: Four times a day (QID) | ORAL | Status: DC | PRN

## 2016-12-02 MED ORDER — ONDANSETRON HCL 4 MG/2ML IJ SOLN: 4.0000 mg | Freq: Four times a day (QID) | INTRAMUSCULAR | Status: DC | PRN

## 2016-12-02 MED ORDER — SODIUM CHLORIDE 0.9 % IV BOLUS (SEPSIS)
1000.0000 mL | Freq: Once | INTRAVENOUS | Status: AC
  Administered 2016-12-02: 1000 mL via INTRAVENOUS

## 2016-12-02 MED ORDER — ACETAMINOPHEN 325 MG PO TABS: 650.0000 mg | ORAL_TABLET | Freq: Four times a day (QID) | ORAL | Status: DC | PRN

## 2016-12-02 MED ORDER — ADULT MULTIVITAMIN W/MINERALS CH
1.0000 | ORAL_TABLET | Freq: Every day | ORAL | Status: DC
  Administered 2016-12-03 – 2016-12-07 (×5): 1 via ORAL
  Filled 2016-12-02 (×5): qty 1

## 2016-12-02 MED ORDER — METHADONE HCL 10 MG PO TABS
40.0000 mg | ORAL_TABLET | Freq: Every day | ORAL | Status: DC
  Administered 2016-12-03: 40 mg via ORAL
  Filled 2016-12-02: qty 4

## 2016-12-02 MED ORDER — LORAZEPAM 2 MG/ML IJ SOLN
1.0000 mg | INTRAMUSCULAR | Status: AC
  Filled 2016-12-02: qty 1

## 2016-12-02 MED ORDER — LORAZEPAM 1 MG PO TABS: 1.0000 mg | ORAL_TABLET | Freq: Four times a day (QID) | ORAL | Status: DC | PRN

## 2016-12-02 MED ORDER — THIAMINE HCL 100 MG/ML IJ SOLN
Freq: Once | INTRAVENOUS | Status: AC
  Administered 2016-12-02: 22:00:00 via INTRAVENOUS
  Filled 2016-12-02: qty 1000

## 2016-12-02 MED ORDER — LORAZEPAM 2 MG/ML IJ SOLN
1.0000 mg | Freq: Once | INTRAMUSCULAR | Status: AC
  Administered 2016-12-02: 1 mg via INTRAVENOUS
  Filled 2016-12-02: qty 1

## 2016-12-02 MED ORDER — LORAZEPAM 2 MG/ML IJ SOLN
0.0000 mg | Freq: Two times a day (BID) | INTRAMUSCULAR | Status: DC
  Administered 2016-12-04: 1 mg via INTRAVENOUS

## 2016-12-02 MED ORDER — ENOXAPARIN SODIUM 40 MG/0.4ML ~~LOC~~ SOLN
40.0000 mg | Freq: Every day | SUBCUTANEOUS | Status: DC
  Administered 2016-12-03 – 2016-12-07 (×5): 40 mg via SUBCUTANEOUS
  Filled 2016-12-02 (×6): qty 0.4

## 2016-12-02 MED ORDER — LORAZEPAM 2 MG/ML IJ SOLN
1.0000 mg | Freq: Four times a day (QID) | INTRAMUSCULAR | Status: DC | PRN
  Administered 2016-12-02 – 2016-12-04 (×4): 1 mg via INTRAVENOUS
  Filled 2016-12-02 (×2): qty 1

## 2016-12-02 MED ORDER — PANTOPRAZOLE SODIUM 40 MG IV SOLR
40.0000 mg | Freq: Two times a day (BID) | INTRAVENOUS | Status: DC
  Administered 2016-12-03 (×2): 40 mg via INTRAVENOUS
  Filled 2016-12-02 (×2): qty 40

## 2016-12-02 MED ORDER — ONDANSETRON 4 MG PO TBDP
4.0000 mg | ORAL_TABLET | Freq: Once | ORAL | Status: AC
  Administered 2016-12-02: 4 mg via ORAL
  Filled 2016-12-02: qty 1

## 2016-12-02 MED ORDER — LORAZEPAM 2 MG/ML IJ SOLN
0.0000 mg | Freq: Four times a day (QID) | INTRAMUSCULAR | Status: AC
  Administered 2016-12-03: 2 mg via INTRAVENOUS
  Filled 2016-12-02 (×3): qty 1

## 2016-12-02 MED ORDER — LOSARTAN POTASSIUM 25 MG PO TABS
25.0000 mg | ORAL_TABLET | Freq: Every day | ORAL | Status: DC
  Administered 2016-12-03 – 2016-12-06 (×4): 25 mg via ORAL
  Filled 2016-12-02 (×5): qty 1

## 2016-12-02 MED ORDER — IPRATROPIUM-ALBUTEROL 0.5-2.5 (3) MG/3ML IN SOLN: 3.0000 mL | RESPIRATORY_TRACT | Status: DC | PRN

## 2016-12-02 MED ORDER — SODIUM CHLORIDE 0.9 % IV SOLN
INTRAVENOUS | Status: DC
  Administered 2016-12-02: 22:00:00 via INTRAVENOUS

## 2016-12-02 MED ORDER — POTASSIUM CHLORIDE 10 MEQ/100ML IV SOLN
10.0000 meq | INTRAVENOUS | Status: AC
  Administered 2016-12-02 (×2): 10 meq via INTRAVENOUS
  Filled 2016-12-02 (×2): qty 100

## 2016-12-02 MED ORDER — LORAZEPAM 1 MG PO TABS
1.0000 mg | ORAL_TABLET | Freq: Once | ORAL | Status: DC
Start: 2016-12-02 — End: 2016-12-02

## 2016-12-02 MED ORDER — CLONIDINE HCL 0.1 MG PO TABS
0.1000 mg | ORAL_TABLET | Freq: Two times a day (BID) | ORAL | Status: DC
  Administered 2016-12-02 – 2016-12-05 (×6): 0.1 mg via ORAL
  Filled 2016-12-02 (×6): qty 1

## 2016-12-02 MED ORDER — FOLIC ACID 1 MG PO TABS
1.0000 mg | ORAL_TABLET | Freq: Every day | ORAL | Status: DC
  Administered 2016-12-03 – 2016-12-07 (×5): 1 mg via ORAL
  Filled 2016-12-02 (×5): qty 1

## 2016-12-02 MED ORDER — VITAMIN B-1 100 MG PO TABS
100.0000 mg | ORAL_TABLET | Freq: Every day | ORAL | Status: DC
  Administered 2016-12-03 – 2016-12-07 (×5): 100 mg via ORAL
  Filled 2016-12-02 (×5): qty 1

## 2016-12-02 NOTE — H&P (Signed)
History and Physical    Peter [name] ZOX:[id number] DOB: [birth year] DOA: 12/02/2016  Referring MD/NP/PA: Levester Fresh, MD( resident) PCP: Massie Maroon, FNP  Patient coming from: home  Chief Complaint: Alcohol  detox  HPI: Peter [name] is a 36 y.o. male with medical history significant of opioid and alcohol abuse; who presents requesting alcohol detoxification. Patient reports drinking 1/5 of whiskey daily on average, but last drank 3 beers early this morning at around 6 AM. Associated symptoms of elevated blood pressure, rapid pulse, anxious, nausea, and intermittent vomiting. Vomitus is nonbloody and has been occurring off and on over the last few months. Denies any previous history of alcohol withdrawal seizures or delirium. He reports also being on methadone, but has not had it in over 3 days because he tested positive on a  breathalyzer test. He has not been able to get into a alcohol program because he is on to high-dose of methadone   ED Course: In the emergency department patient was found to be tremulous, tachycardic, and blood pressures elevated up to 160/111. Patient was started on CWIA protocol given Ativan. [facility] called to admit.  Review of Systems  Constitutional: Positive for malaise/fatigue. Negative for chills.  HENT: Negative for ear discharge and ear pain.   Eyes: Negative for photophobia and pain.  Respiratory: Negative for cough and shortness of breath.   Cardiovascular: Negative for chest pain and leg swelling.  Gastrointestinal: Positive for nausea and vomiting.  Genitourinary: Negative for frequency and urgency.  Musculoskeletal: Negative for falls and joint pain.  Skin: Negative for itching and rash.  Neurological: Negative for speech change and focal weakness.  Psychiatric/Behavioral: Positive for substance abuse. The patient is nervous/anxious.     Past Medical History:  Diagnosis Date  . Alcoholism (HCC)   . Substance abuse [facility])     Past  Surgical History:  Procedure Laterality Date  . DENTAL SURGERY       reports that he has been smoking Cigarettes.  He has been smoking about 0.50 packs per day. He has never used smokeless tobacco. He reports that he drinks alcohol. He reports that he does not use drugs.  No Known Allergies  Family History  Problem Relation Age of Onset  . Hypertension Father   . Stroke Father   . CAD Father   . Cancer Paternal Grandfather        Lung  . Hyperlipidemia Paternal Grandfather   . Hypertension Paternal Grandfather     Prior to Admission medications   Medication Sig Start Date End Date Taking? Authorizing Provider  cloNIDine (CATAPRES) 0.1 MG tablet Take 1 tablet (0.1 mg total) by mouth daily. 11/13/16  Yes Massie Maroon, FNP  folic acid (FOLVITE) 1 MG tablet Take 1 tablet (1 mg total) by mouth daily. 10/13/16  Yes Massie Maroon, FNP  losartan (COZAAR) 25 MG tablet Take 1 tablet (25 mg total) by mouth daily. 11/13/16  Yes Massie Maroon, FNP  METHADONE HCL PO Take 95 mg by mouth daily.   Yes [provider]  OVER THE COUNTER MEDICATION Take 4 capsules by mouth See admin instructions. Juice plus -  Take 2 juice capsules and 2 vegetable capsules every morning   Yes [provider]  thiamine (VITAMIN B-1) 100 MG tablet Take 1 tablet (100 mg total) by mouth daily. 10/13/16  Yes Massie Maroon, FNP  busPIRone (BUSPAR) 10 MG tablet Take 1 tablet (10 mg total) by mouth 2 (two) times daily. Patient not  taking: Reported on 12/02/2016 10/13/16   Massie Maroon, FNP  ondansetron (ZOFRAN ODT) 4 MG disintegrating tablet Take 1 tablet (4 mg total) by mouth every 8 (eight) hours as needed for nausea or vomiting. Patient not taking: Reported on 11/13/2016 10/11/16   Anselm Pancoast, PA-C    Physical Exam:  Constitutional: young male who appears very uncomfortable and fidgeting Vitals:   12/02/16 1815 12/02/16 1830 12/02/16 1845 12/02/16 1900  BP: (!) 145/98 (!) 144/97 (!)  143/99 (!) 149/100  Pulse: (!) 102 93 97 (!) 118  Resp:    18  Temp:      TempSrc:      SpO2: 98% 98% 96% 94%  Weight:      Height:       Eyes: PERRL, lids and conjunctivae normal ENMT: Mucous membranes are moist. Posterior pharynx clear of any exudate or lesions. Neck: normal, supple, no masses, no thyromegaly Respiratory: clear to auscultation bilaterally, no wheezing, no crackles. Normal respiratory effort. No accessory muscle use.  Cardiovascular: tachycardic, no murmurs / rubs / gallops. No extremity edema. 2+ pedal pulses. No carotid bruits.  Abdomen: no tenderness, no masses palpated. No hepatosplenomegaly. Bowel sounds positive.  Musculoskeletal: no clubbing / cyanosis. No joint deformity upper and lower extremities. Good ROM, no contractures. Normal muscle tone.  Skin: no rashes, lesions, ulcers. No induration Neurologic: CN 2-12 grossly intact. Sensation intact, DTR normal. Strength 5/5 in all 4.  Psychiatric: Normal judgment and insight. Alert and oriented x 3. Anxious mood.     Labs on Admission: I have personally reviewed following labs and imaging studies  CBC:  Recent Labs Lab 12/02/16 1252  WBC 12.0*  HGB 17.5*  HCT 50.0  MCV 94.5  PLT 276   Basic Metabolic Panel:  Recent Labs Lab 12/02/16 1252  NA 132*  K 3.3*  CL 94*  CO2 24  GLUCOSE 112*  BUN 5*  CREATININE 0.96  CALCIUM 9.4   GFR: Estimated Creatinine Clearance: 114.4 mL/min (by C-G formula based on SCr of 0.96 mg/dL). Liver Function Tests:  Recent Labs Lab 12/02/16 1252  AST 154*  ALT 117*  ALKPHOS 78  BILITOT 0.9  PROT 8.3*  ALBUMIN 4.3   No results for input(s): LIPASE, AMYLASE in the last 168 hours. No results for input(s): AMMONIA in the last 168 hours. Coagulation Profile: No results for input(s): INR, PROTIME in the last 168 hours. Cardiac Enzymes: No results for input(s): CKTOTAL, CKMB, CKMBINDEX, TROPONINI in the last 168 hours. BNP (last 3 results) No results for  input(s): PROBNP in the last 8760 hours. HbA1C: No results for input(s): HGBA1C in the last 72 hours. CBG: No results for input(s): GLUCAP in the last 168 hours. Lipid Profile: No results for input(s): CHOL, HDL, LDLCALC, TRIG, CHOLHDL, LDLDIRECT in the last 72 hours. Thyroid Function Tests: No results for input(s): TSH, T4TOTAL, FREET4, T3FREE, THYROIDAB in the last 72 hours. Anemia Panel: No results for input(s): VITAMINB12, FOLATE, FERRITIN, TIBC, IRON, RETICCTPCT in the last 72 hours. Urine analysis:    Component Value Date/Time   COLORURINE YELLOW 10/10/2016 2132   APPEARANCEUR CLEAR 10/10/2016 2132   LABSPEC <=1.005 11/13/2016 1234   PHURINE 5.5 11/13/2016 1234   GLUCOSEU NEGATIVE 11/13/2016 1234   HGBUR NEGATIVE 11/13/2016 1234   BILIRUBINUR NEGATIVE 11/13/2016 1234   KETONESUR NEGATIVE 11/13/2016 1234   PROTEINUR NEGATIVE 11/13/2016 1234   UROBILINOGEN 0.2 11/13/2016 1234   NITRITE POSITIVE (A) 11/13/2016 1234   LEUKOCYTESUR NEGATIVE 11/13/2016 1234  Sepsis Labs: No results found for this or any previous visit (from the past 240 hour(s)).   Radiological Exams on Admission: No results found.  EKG: Independently reviewed.sinus tachycardia  Assessment/Plan Alcohol intoxication with withdrawal, alcohol dependence: Acute. Patient requesting alcohol detox. He presents with hypertension and tachycardia. Normally drinks a fifth of liquor daily. Last reports drinking 3 beers at 6 a.m. - Admit to a stepdown bed  - CWIA protocols with scheduled Ativan - MVI, Folic acid, and Thiamine  - social work consult for alcohol abuse  Leukocytosis: acute. WBC elevated at 12.unclear of any signs of infection.Previous urinalysis showed signs of nitrites on 11/13/16. - Check urinalysis  Essential hypertension:blood pressure noted to be elevated up to 160/111 - Continue losartan - increase clonidine to twice daily  Nausea and vomiting - antiemetics as needed  Opioid abuse on  methadone: Patient reports being off methadone, but has not had his methadone and 3 days due to testing positive on breathalyzer for alcohol. Patient reports inability to get into rehabilitation because of dose of methadone being too high. - Decrease patient down to 40 mg of methadone daily  Hypokalemia: Acute. initial potassium 3.3 - give 20 mEq of potassium chloride IV - continue to monitor and replace as needed  Hyponatremia: acute. Sodium 132 on admission suspect secondary to alcohol abuse and/or dehydration  Transaminitis:chronic. AST 154 and ALT 117.  - recheck CMP in a.m.  DVT prophylaxis: lovenox  Code Status: Full Family Communication: discussed the care with the patient and his dad at bedside Disposition Plan: hopefully discharge to a rehabilitation facility Consults called: none Admission status: Inpatient   Clydie Braun MD [facility] Pager [phone]   If 7PM-7AM, please contact night-coverage [email or web address] Password [facility]  12/02/2016, 7:43 PM

## 2016-12-02 NOTE — ED Triage Notes (Signed)
Pt. Stated, I had 3 beers this am at 600am.

## 2016-12-02 NOTE — ED Provider Notes (Signed)
[facility]-EMERGENCY DEPT Provider Note   CSN: [id number] Arrival date & time: 12/02/16  1204 History   Chief Complaint Chief Complaint  Patient presents with  . Hypertension  . Addiction Problem  . Emesis   HPI Peter [name] is a 36 y.o. male.  The patient is a 36 year old male with a past medical history significant for hypertension, alcohol abuse, and methadone use (his of prior opiate misuse), who presents in the company of his father to the ED complaining of alcohol withdrawal symptoms.  The patient last drank alcohol this morning, ~2-3 beers.  He reports agitation, nausea, and multiple episodes of vomiting prior to ED arrival and in the waiting room. The patient has worked with his primary care provider to find a detox center, however due to his concurrent methadone use and no insurance, this has proven difficult.  The patient reports drinking a fifth of liquor daily for the past year, with heavy alcohol use for many years prior.  He is concerned about his blood pressure today and has been taking all anti-hypertensive medications as prescribed.  Records review reveals the patient's NP located a center in [place] several weeks ago that would accept the patient for detox, however the patient states he was never made aware of this.   The history is provided by the patient, a parent and medical records. No language interpreter was used.   Past Medical History:  Diagnosis Date  . Alcoholism (HCC)   . Substance abuse [facility])   is Patient Active Problem List   Diagnosis Date Noted  . Alcohol dependence with other alcohol-induced disorder (HCC) 11/18/2016  . 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 Essential hypertension 11/18/2016  . Elevated liver enzymes 11/14/2016   Past Surgical History:  Procedure Laterality Date  . DENTAL SURGERY      Home Medications    Prior to Admission medications   Medication Sig Start Date End Date Taking? Authorizing Provider  busPIRone (BUSPAR) 10 MG  tablet Take 1 tablet (10 mg total) by mouth 2 (two) times daily. 10/13/16   Massie Maroon, FNP  cloNIDine (CATAPRES) 0.1 MG tablet Take 1 tablet (0.1 mg total) by mouth daily. 11/13/16   Massie Maroon, FNP  folic acid (FOLVITE) 1 MG tablet Take 1 tablet (1 mg total) by mouth daily. 10/13/16   Massie Maroon, FNP  losartan (COZAAR) 25 MG tablet Take 1 tablet (25 mg total) by mouth daily. 11/13/16   Massie Maroon, FNP  methadone (DOLOPHINE) 0.4 mg/mL SOLN Take 115 mg by mouth daily.    [provider]  ondansetron (ZOFRAN ODT) 4 MG disintegrating tablet Take 1 tablet (4 mg total) by mouth every 8 (eight) hours as needed for nausea or vomiting. Patient not taking: Reported on 11/13/2016 10/11/16   Harolyn Rutherford C, PA-C  thiamine (VITAMIN B-1) 100 MG tablet Take 1 tablet (100 mg total) by mouth daily. 10/13/16   Massie Maroon, FNP    Family History Family History  Problem Relation Age of Onset  . Hypertension Father   . Stroke Father   . CAD Father   . Cancer Paternal Grandfather        Lung  . Hyperlipidemia Paternal Grandfather   . Hypertension Paternal Grandfather     Social History Social History  Substance Use Topics  . Smoking status: Current Every Day Smoker    Packs/day: 0.50    Types: Cigarettes  . Smokeless tobacco: Never Used  . Alcohol use Yes  Comment: fifth a day whiskey. nothing in 3 days (10/13/2016)     Allergies   Patient has no known allergies.   Review of Systems Review of Systems  Constitutional: Negative.   HENT: Negative.  Negative for ear pain and sore throat.   Eyes: Negative for visual disturbance.  Respiratory: Negative for cough and shortness of breath.   Cardiovascular: Positive for palpitations. Negative for chest pain.  Gastrointestinal: Positive for nausea and vomiting. Negative for abdominal pain and diarrhea.  Genitourinary: Negative for dysuria and hematuria.  Musculoskeletal: Negative for arthralgias and back pain.    Skin: Negative for color change and rash.  Allergic/Immunologic: Negative for immunocompromised state.  Neurological: Negative for seizures and syncope.  Hematological: Negative.   Psychiatric/Behavioral: Positive for agitation.  All other systems reviewed and are negative.  Physical Exam Updated Vital Signs BP (!) 160/111 (BP Location: Right Arm)   Pulse (!) 120   Temp 98.4 F (36.9 C) (Oral)   Resp 18   Ht  (1.803 m)   Wt 87.1 kg (192 lb)   SpO2 96%   BMI 26.78 kg/m   Physical Exam  Constitutional: He is oriented to person, place, and time. He appears well-developed and well-nourished.  Agitated, uncomfortable male  HENT:  Head: Normocephalic and atraumatic.  Dry mucous membranes  Eyes: Pupils are equal, round, and reactive to light. Conjunctivae and EOM are normal.  Neck: Neck supple.  Cardiovascular: Regular rhythm, normal heart sounds and intact distal pulses.   No murmur heard. Tachycardia, hypertension  Pulmonary/Chest: Effort normal and breath sounds normal. No respiratory distress. He has no wheezes.  Abdominal: Soft. He exhibits no mass. There is no tenderness. There is no guarding.  Musculoskeletal: He exhibits no edema or tenderness.  Lymphadenopathy:    He has no cervical adenopathy.  Neurological: He is alert and oriented to person, place, and time.  Skin: Skin is warm and dry. Capillary refill takes less than 2 seconds.  Psychiatric: Judgment and thought content normal.  Agitated, irritable, and interacting appropriately  Nursing note and vitals reviewed.  ED Treatments / Results  Labs (all labs ordered are listed, but only abnormal results are displayed) Labs Reviewed  COMPREHENSIVE METABOLIC PANEL - Abnormal; Notable for the following:       Result Value   Sodium 132 (*)    Potassium 3.3 (*)    Chloride 94 (*)    Glucose, Bld 112 (*)    BUN 5 (*)    Total Protein 8.3 (*)    AST 154 (*)    ALT 117 (*)    All other components within  normal limits  ETHANOL - Abnormal; Notable for the following:    Alcohol, Ethyl (B) 130 (*)    All other components within normal limits  CBC - Abnormal; Notable for the following:    WBC 12.0 (*)    Hemoglobin 17.5 (*)    All other components within normal limits  RAPID URINE DRUG SCREEN, HOSP PERFORMED   EKG  EKG Interpretation None      Radiology No results found.  Procedures Procedures (including critical care time)  Medications Ordered in ED Medications - No data to display   Initial Impression / Assessment and Plan / ED Course  I have reviewed the triage vital signs and the nursing notes.  Pertinent labs & imaging results that were available during my care of the patient were reviewed by me and considered in my medical decision making (see chart for  details).    Initial differential diagnosis included alcohol withdrawal, alcohol intoxication, opioid withdrawal, and anxiety.  Pertinent labs included CBC with leukocytosis and elevated hemoglobin.  CMP with hyponatremia, hypokalemia, and elevated liver enzymes.  Ethanol level 130.  UDS negative.  The patient was given IVF for tachycardia and dehydration, as evidenced by dry mucous membranes and hemoconcentration.  CIWA protocol initiated.  Upon reassessment, he had no new complaints or symptoms.  His agitation had improved with benzodiazepine administration.  Based on the above findings, I suspect the patient is withdrawing from alcohol at this time.  He has expressed his desire to quit drinking on several occasions and has been working with healthcare providers to ensure success in this process.  The patient was admitted to the hospitalist service for further evaluation and treatment.  The patient was in fair condition at the time of admission.  Final Clinical Impressions(s) / ED Diagnoses   Final diagnoses:  Alcohol withdrawal syndrome without complication (HCC)  Hypertension, unspecified type   New Prescriptions New  Prescriptions   No medications on file     Levester Fresh, MD 12/03/16 0109    Gerhard Munch, MD 12/03/16 1531

## 2016-12-02 NOTE — ED Notes (Signed)
Pharmacy called for methadone

## 2016-12-02 NOTE — ED Triage Notes (Signed)
Pt. Stated, Im here for my hypertension and to try to get detox. From alcohol. I go to a [facility] . I take 2 BP medications.

## 2016-12-03 DIAGNOSIS — D72829 Elevated white blood cell count, unspecified: Secondary | ICD-10-CM | POA: Diagnosis present

## 2016-12-03 DIAGNOSIS — E876 Hypokalemia: Secondary | ICD-10-CM | POA: Diagnosis present

## 2016-12-03 LAB — COMPREHENSIVE METABOLIC PANEL
ALBUMIN: 3.3 g/dL — AB (ref 3.5–5.0)
ALT: 73 U/L — ABNORMAL HIGH (ref 17–63)
ANION GAP: 9 (ref 5–15)
AST: 77 U/L — AB (ref 15–41)
Alkaline Phosphatase: 57 U/L (ref 38–126)
BILIRUBIN TOTAL: 1.8 mg/dL — AB (ref 0.3–1.2)
BUN: 5 mg/dL — AB (ref 6–20)
CHLORIDE: 100 mmol/L — AB (ref 101–111)
CO2: 26 mmol/L (ref 22–32)
Calcium: 8.3 mg/dL — ABNORMAL LOW (ref 8.9–10.3)
Creatinine, Ser: 0.92 mg/dL (ref 0.61–1.24)
GFR calc Af Amer: >60 mL/min (ref >60)
GFR calc non Af Amer: >60 mL/min (ref >60)
GLUCOSE: 109 mg/dL — AB (ref 65–99)
POTASSIUM: 4.1 mmol/L (ref 3.5–5.1)
SODIUM: 135 mmol/L (ref 135–145)
Total Protein: 6.2 g/dL — ABNORMAL LOW (ref 6.5–8.1)

## 2016-12-03 LAB — CBC
HCT: 41.6 % (ref 39.0–52.0)
Hemoglobin: 14 g/dL (ref 13.0–17.0)
MCH: 32.3 pg (ref 26.0–34.0)
MCHC: 33.7 g/dL (ref 30.0–36.0)
MCV: 96.1 fL (ref 78.0–100.0)
PLATELETS: 203 10*3/uL (ref 150–400)
RBC: 4.33 MIL/uL (ref 4.22–5.81)
RDW: 12.5 % (ref 11.5–15.5)
WBC: 9.6 10*3/uL (ref 4.0–10.5)

## 2016-12-03 LAB — URINALYSIS, ROUTINE W REFLEX MICROSCOPIC
BILIRUBIN URINE: NEGATIVE
Glucose, UA: NEGATIVE mg/dL
HGB URINE DIPSTICK: NEGATIVE
KETONES UR: NEGATIVE mg/dL
Nitrite: NEGATIVE
PROTEIN: NEGATIVE mg/dL
SPECIFIC GRAVITY, URINE: 1.01 (ref 1.005–1.030)
pH: 6 (ref 5.0–8.0)

## 2016-12-03 LAB — URINALYSIS, MICROSCOPIC (REFLEX): RBC / HPF: NONE SEEN RBC/hpf (ref 0–5)

## 2016-12-03 MED ORDER — PANTOPRAZOLE SODIUM 40 MG PO TBEC
40.0000 mg | DELAYED_RELEASE_TABLET | Freq: Two times a day (BID) | ORAL | Status: DC
  Administered 2016-12-03 – 2016-12-07 (×8): 40 mg via ORAL
  Filled 2016-12-03 (×8): qty 1

## 2016-12-03 MED ORDER — ENALAPRILAT 1.25 MG/ML IV SOLN
0.6250 mg | Freq: Four times a day (QID) | INTRAVENOUS | Status: AC
  Administered 2016-12-03 – 2016-12-04 (×2): 0.625 mg via INTRAVENOUS
  Filled 2016-12-03 (×2): qty 0.5

## 2016-12-03 NOTE — ED Notes (Signed)
Pt sleeping intermittently 

## 2016-12-03 NOTE — Progress Notes (Signed)
PROGRESS NOTE  Peter [name] ZOX:[id number] DOB: [birth year] DOA: 12/02/2016 PCP: Massie Maroon, FNP   LOS: 1 day   Brief Narrative / Interim history: 36 y.o. male with medical history significant of opioid and alcohol abuse; who presents requesting alcohol detoxification. Patient reports drinking 1/5 of whiskey daily on average, but last drank 3 beers early 10/13 morning at around 6 AM.   Assessment & Plan: Principal Problem:   Alcohol withdrawal (HCC) Active Problems:   Elevated liver enzymes   Methadone use (HCC)   Essential hypertension   Hypokalemia   Leukocytosis   Alcohol intoxication with withdrawal, alcohol dependence -Continue CIWA, he appears calm this morning  Hypertension -Continue clonidine, continue losartan  Elevated LFTs -AST and ALT are improving, bilirubin is increasing today, this usually lags behind  Chronic pain -On methadone, patient is trying to come off of it  Hyponatremia -Likely due to alcohol abuse/dehydration, normalized with fluids  Hypokalemia -Continue to monitor and replace as needed, normal this morning   DVT prophylaxis: Lovenox Code Status: Full code Family Communication: no family at bedside Disposition Plan: monitor in SDU today   Consultants:   None   Procedures:   None   Antimicrobials:  None    Subjective: - no chest pain, shortness of breath, no abdominal pain, nausea or vomiting.   Objective: Vitals:   12/03/16 0630 12/03/16 0715 12/03/16 0800 12/03/16 0918  BP: 126/86 (!) 139/94 (!) 138/93 (!) 157/108  Pulse: 72 82 80   Resp: 19  15   Temp:      TempSrc:      SpO2: 98% 97% 96%   Weight:      Height:        Intake/Output Summary (Last 24 hours) at 12/03/16 1127 Last data filed at 12/03/16 0028  Gross per 24 hour  Intake             1200 ml  Output                0 ml  Net             1200 ml   Filed Weights   12/02/16 1237  Weight: 87.1 kg (192 lb)    Examination:  Constitutional:  NAD Eyes: lids and conjunctivae normal ENMT: Mucous membranes are moist.  Respiratory: clear to auscultation bilaterally, no wheezing, no crackles. Normal respiratory effort. No accessory muscle use.  Cardiovascular: Regular rate and rhythm, no murmurs / rubs / gallops. No LE edema. 2+ pedal pulses. No carotid bruits.  Abdomen: no tenderness. Bowel sounds positive.  Skin: no rashes, lesions, ulcers. No induration Neurologic: non focal  Psychiatric: Normal judgment and insight. Alert and oriented x 3. Normal mood.    Data Reviewed: I have independently reviewed following labs and imaging studies  CBC:  Recent Labs Lab 12/02/16 1252 12/03/16 0406  WBC 12.0* 9.6  HGB 17.5* 14.0  HCT 50.0 41.6  MCV 94.5 96.1  PLT 276 203   Basic Metabolic Panel:  Recent Labs Lab 12/02/16 1252 12/03/16 0406  NA 132* 135  K 3.3* 4.1  CL 94* 100*  CO2 24 26  GLUCOSE 112* 109*  BUN 5* 5*  CREATININE 0.96 0.92  CALCIUM 9.4 8.3*   GFR: Estimated Creatinine Clearance: 119.4 mL/min (by C-G formula based on SCr of 0.92 mg/dL). Liver Function Tests:  Recent Labs Lab 12/02/16 1252 12/03/16 0406  AST 154* 77*  ALT 117* 73*  ALKPHOS 78 57  BILITOT 0.9 1.8*  PROT  8.3* 6.2*  ALBUMIN 4.3 3.3*   No results for input(s): LIPASE, AMYLASE in the last 168 hours. No results for input(s): AMMONIA in the last 168 hours. Coagulation Profile: No results for input(s): INR, PROTIME in the last 168 hours. Cardiac Enzymes: No results for input(s): CKTOTAL, CKMB, CKMBINDEX, TROPONINI in the last 168 hours. BNP (last 3 results) No results for input(s): PROBNP in the last 8760 hours. HbA1C: No results for input(s): HGBA1C in the last 72 hours. CBG: No results for input(s): GLUCAP in the last 168 hours. Lipid Profile: No results for input(s): CHOL, HDL, LDLCALC, TRIG, CHOLHDL, LDLDIRECT in the last 72 hours. Thyroid Function Tests: No results for input(s): TSH, T4TOTAL, FREET4, T3FREE, THYROIDAB in  the last 72 hours. Anemia Panel: No results for input(s): VITAMINB12, FOLATE, FERRITIN, TIBC, IRON, RETICCTPCT in the last 72 hours. Urine analysis:    Component Value Date/Time   COLORURINE YELLOW 12/02/2016 0550   APPEARANCEUR HAZY (A) 12/02/2016 0550   LABSPEC 1.010 12/02/2016 0550   PHURINE 6.0 12/02/2016 0550   GLUCOSEU NEGATIVE 12/02/2016 0550   HGBUR NEGATIVE 12/02/2016 0550   BILIRUBINUR NEGATIVE 12/02/2016 0550   KETONESUR NEGATIVE 12/02/2016 0550   PROTEINUR NEGATIVE 12/02/2016 0550   UROBILINOGEN 0.2 11/13/2016 1234   NITRITE NEGATIVE 12/02/2016 0550   LEUKOCYTESUR MODERATE (A) 12/02/2016 0550   Sepsis Labs: Invalid input(s): PROCALCITONIN, LACTICIDVEN  No results found for this or any previous visit (from the past 240 hour(s)).    Radiology Studies: No results found.   Scheduled Meds: . cloNIDine  0.1 mg Oral BID  . enoxaparin (LOVENOX) injection  40 mg Subcutaneous Daily  . folic acid  1 mg Oral Daily  . LORazepam  0-4 mg Intravenous Q6H   Followed by  . [START ON 12/04/2016] LORazepam  0-4 mg Intravenous Q12H  . LORazepam  1 mg Intravenous STAT  . losartan  25 mg Oral Daily  . methadone  40 mg Oral Daily  . multivitamin with minerals  1 tablet Oral Daily  . pantoprazole (PROTONIX) IV  40 mg Intravenous Q12H  . thiamine  100 mg Oral Daily   Continuous Infusions: . sodium chloride 75 mL/hr at 12/02/16 2208    Pamella Pert, MD, PhD [facility] Pager [phone]  If 7PM-7AM, please contact night-coverage [email or web address] Password [email or web address] 12/03/2016, 11:27 AM

## 2016-12-03 NOTE — Progress Notes (Signed)
Patient arrived from the ED. Received report from [facility] who stated that patient was admitted for Alcohol Detox and High Blood Pressure. Patient VS are 157/108, HR 102, RR14, O2  Patient is resting in bed, he is alert and oriented, calm, patient skin is warm and dry, patient denies any pain but states that he is feeling a bit anxious.

## 2016-12-03 NOTE — Progress Notes (Signed)
Patient B/P 171/110. Notified Provider. Patient is alert and oriented and sitting up in bed.

## 2016-12-03 NOTE — ED Notes (Signed)
The pt s order for methadone is not ordered until the am  According to pharmacist

## 2016-12-03 NOTE — ED Notes (Signed)
Pt asking for more ativan  He reports that he feels like withdrawal symptoms creepingup on him again  ciwa neg around   0030 when last given

## 2016-12-03 NOTE — ED Notes (Signed)
The pt is comfortable  No discomfort and no distress

## 2016-12-04 MED ORDER — LORAZEPAM 1 MG PO TABS
1.0000 mg | ORAL_TABLET | Freq: Four times a day (QID) | ORAL | Status: AC | PRN
  Administered 2016-12-05: 1 mg via ORAL
  Filled 2016-12-04: qty 1

## 2016-12-04 MED ORDER — LORAZEPAM 2 MG/ML IJ SOLN
1.0000 mg | Freq: Four times a day (QID) | INTRAMUSCULAR | Status: AC | PRN
  Administered 2016-12-04: 1 mg via INTRAVENOUS
  Filled 2016-12-04: qty 1

## 2016-12-04 MED ORDER — LORAZEPAM 2 MG/ML IJ SOLN: 0.0000 mg | Freq: Two times a day (BID) | INTRAMUSCULAR | Status: AC

## 2016-12-04 MED ORDER — LORAZEPAM 2 MG/ML IJ SOLN
1.0000 mg | Freq: Once | INTRAMUSCULAR | Status: AC
  Administered 2016-12-04: 1 mg via INTRAVENOUS
  Filled 2016-12-04: qty 1

## 2016-12-04 MED ORDER — VENLAFAXINE HCL ER 37.5 MG PO CP24
37.5000 mg | ORAL_CAPSULE | Freq: Every day | ORAL | Status: DC
  Administered 2016-12-04 – 2016-12-07 (×4): 37.5 mg via ORAL
  Filled 2016-12-04 (×4): qty 1

## 2016-12-04 MED ORDER — METHADONE HCL 10 MG PO TABS
60.0000 mg | ORAL_TABLET | Freq: Every day | ORAL | Status: DC
  Administered 2016-12-04 – 2016-12-05 (×2): 60 mg via ORAL
  Filled 2016-12-04 (×2): qty 6

## 2016-12-04 NOTE — Progress Notes (Signed)
PROGRESS NOTE  Peter [name] [id number] DOB: 1981-01-08 DOA: 12/02/2016 PCP: Massie Maroon, FNP   LOS: 2 days   Brief Narrative / Interim history: 36 y.o. male with medical history significant of opioid and alcohol abuse; who presents requesting alcohol detoxification. Patient reports drinking 1/5 of whiskey daily on average, but last drank 3 beers early 10/13 morning at around 6 AM.   Assessment & Plan: Principal Problem:   Alcohol withdrawal (HCC) Active Problems:   Elevated liver enzymes   Methadone use (HCC)   Essential hypertension   Hypokalemia   Leukocytosis   Alcohol intoxication with withdrawal, alcohol dependence -Continue CIWA, he appears calm this morning but still triggering overnight. Had a restless night and did not sleep that much. Denies visual/auditory/tactile hallucinations -SW to see  Hypertension -Continue clonidine, continue losartan, suspect still elevated in the setting of ongoing withdrawals  Elevated LFTs -LFTS overall improving, repeat in am   Chronic pain -On methadone, patient is trying to come off of it, he was on 95 mg prior to admission and was placed on 40 mg, patient feels like that was a a large decrease and appes to be withdrawing from that as well. Will increase to 60 today, if stable can probably remain on 60 until his outpatient appointment in methadone clinic  GAD -patient with anxiety disorder, asks for treatment. He was told to avoid benzodiazepines in the past, I agree as has high risk of misuse. Start Effexor  Hyponatremia -Likely due to alcohol abuse/dehydration, normalized with fluids  Hypokalemia -Continue to monitor and replace as needed, recheck in am    DVT prophylaxis: Lovenox Code Status: Full code Family Communication: father at bedside  Disposition Plan: transfer to telemetry today   Consultants:   None   Procedures:   None   Antimicrobials:  None    Subjective: - no chest pain, shortness  of breath, no abdominal pain, nausea or vomiting.   Objective: Vitals:   12/04/16 0000 12/04/16 0334 12/04/16 0600 12/04/16 0852  BP: (!) 139/109 (!) 142/93 (!) 147/110 (!) 142/102  Pulse: 72 88 97 77  Resp: Temp: 98.4 F (36.9 C) (!) 97.4 F (36.3 C)  98 F (36.7 C)  TempSrc: Oral Oral  Oral  SpO2: 95% 99%  96%  Weight:      Height:       No intake or output data in the 24 hours ending 12/04/16 0855 Filed Weights   12/02/16 1237  Weight: 87.1 kg (192 lb)    Examination:  Constitutional: NAD Eyes: no scleral icterus  ENMT: MMM Respiratory: CTA biL, no wheezing, no crackles  Cardiovascular: RRR, no MRG Abdomen: soft, non tender, non distended, BS + Skin: no rashes Neurologic: non focal, ambulatory  Psychiatric: AxOx3   Data Reviewed: I have independently reviewed following labs and imaging studies  CBC:  Recent Labs Lab 12/02/16 1252 12/03/16 0406  WBC 12.0* 9.6  HGB 17.5* 14.0  HCT 50.0 41.6  MCV 94.5 96.1  PLT 276 203   Basic Metabolic Panel:  Recent Labs Lab 12/02/16 1252 12/03/16 0406  NA 132* 135  K 3.3* 4.1  CL 94* 100*  CO2 24 26  GLUCOSE 112* 109*  BUN 5* 5*  CREATININE 0.96 0.92  CALCIUM 9.4 8.3*   GFR: Estimated Creatinine Clearance: 119.4 mL/min (by C-G formula based on SCr of 0.92 mg/dL). Liver Function Tests:  Recent Labs Lab 12/02/16 1252 12/03/16 0406  AST 154* 77*  ALT 117* 73*  ALKPHOS 78 57  BILITOT 0.9 1.8*  PROT 8.3* 6.2*  ALBUMIN 4.3 3.3*   No results for input(s): LIPASE, AMYLASE in the last 168 hours. No results for input(s): AMMONIA in the last 168 hours. Coagulation Profile: No results for input(s): INR, PROTIME in the last 168 hours. Cardiac Enzymes: No results for input(s): CKTOTAL, CKMB, CKMBINDEX, TROPONINI in the last 168 hours. BNP (last 3 results) No results for input(s): PROBNP in the last 8760 hours. HbA1C: No results for input(s): HGBA1C in the last 72 hours. CBG: No results for  input(s): GLUCAP in the last 168 hours. Lipid Profile: No results for input(s): CHOL, HDL, LDLCALC, TRIG, CHOLHDL, LDLDIRECT in the last 72 hours. Thyroid Function Tests: No results for input(s): TSH, T4TOTAL, FREET4, T3FREE, THYROIDAB in the last 72 hours. Anemia Panel: No results for input(s): VITAMINB12, FOLATE, FERRITIN, TIBC, IRON, RETICCTPCT in the last 72 hours. Urine analysis:    Component Value Date/Time   COLORURINE YELLOW 12/02/2016 0550   APPEARANCEUR HAZY (A) 12/02/2016 0550   LABSPEC 1.010 12/02/2016 0550   PHURINE 6.0 12/02/2016 0550   GLUCOSEU NEGATIVE 12/02/2016 0550   HGBUR NEGATIVE 12/02/2016 0550   BILIRUBINUR NEGATIVE 12/02/2016 0550   KETONESUR NEGATIVE 12/02/2016 0550   PROTEINUR NEGATIVE 12/02/2016 0550   UROBILINOGEN 0.2 11/13/2016 1234   NITRITE NEGATIVE 12/02/2016 0550   LEUKOCYTESUR MODERATE (A) 12/02/2016 0550   Sepsis Labs: Invalid input(s): PROCALCITONIN, LACTICIDVEN  No results found for this or any previous visit (from the past 240 hour(s)).    Radiology Studies: No results found.   Scheduled Meds: . cloNIDine  0.1 mg Oral BID  . enoxaparin (LOVENOX) injection  40 mg Subcutaneous Daily  . folic acid  1 mg Oral Daily  . LORazepam  0-4 mg Intravenous Q6H   Followed by  . LORazepam  0-4 mg Intravenous Q12H  . losartan  25 mg Oral Daily  . methadone  60 mg Oral Daily  . multivitamin with minerals  1 tablet Oral Daily  . pantoprazole  40 mg Oral BID  . thiamine  100 mg Oral Daily  . venlafaxine XR  37.5 mg Oral Q breakfast   Continuous Infusions: . sodium chloride 75 mL/hr at 12/02/16 2208    Pamella Pert, MD, PhD [facility] Pager [phone]  If 7PM-7AM, please contact night-coverage [email or web address] Password [email or web address] 12/04/2016, 8:55 AM

## 2016-12-04 NOTE — Clinical Social Work Note (Signed)
Clinical Social Work Assessment  Patient Details  Name: Peter [name] MRN: [id number] Date of Birth: 1980/10/18  Date of referral:  12/04/16               Reason for consult:  Substance Use/ETOH Abuse                Permission sought to share information with:  Family Supports Permission granted to share information::  Yes, Verbal Permission Granted  Name::     Peter [name]  Agency::     Relationship::  father  Contact Information:  [phone]  Housing/Transportation Living arrangements for the past 2 months:  [place] of Information:  Patient Patient Interpreter Needed:  None Criminal Activity/Legal Involvement Pertinent to Current Situation/Hospitalization:  No - Comment as needed Significant Relationships:  Parents Lives with:    Do you feel safe going back to the place where you live?  Yes Need for family participation in patient care:  No (Coment)  Care giving concerns: Patient from home. Father at bedside. Patient agreeable to discuss substance use with father present.  Social Worker assessment / plan: CSW met with patient and patient's father at bedside. Patient and father requesting substance use resources. Patient endorsed using alcohol; patient is on CIWA. Patient reports also being on methadone maintenance. CSW provided patient and father with list of inpatient and outpatient substance use treatment centers, and highlighted facilities that accept self-pay, as patient does not have insurance. CSW advised patient and father to explore options and costs and consider outpatient treatment if inpatient is cost-prohibitive.  Employment status:    Insurance information:  Self Pay (Medicaid Pending) PT Recommendations:  Not assessed at this time Information / Referral to community resources:  Outpatient Substance Abuse Treatment Options, Residential Substance Abuse Treatment Options  Patient/Family's Response to care: Patient and father appreciative of care  and resources.  Patient/Family's Understanding of and Emotional Response to Diagnosis, Current Treatment, and Prognosis: Patient and father understanding of patient's condition and hopeful to find inpatient substance use treatment.  Emotional Assessment Appearance:  Appears stated age Attitude/Demeanor/Rapport:  Other (appropriate) Affect (typically observed):  Calm Orientation:  Oriented to Self, Oriented to Place, Oriented to  Time, Oriented to Situation Alcohol / Substance use:  Illicit Drugs, Alcohol Use Psych involvement (Current and /or in the community):  No (Comment)  Discharge Needs  Concerns to be addressed:  Substance Abuse Concerns Readmission within the last 30 days:  No Current discharge risk:  Substance Abuse Barriers to Discharge:  Continued Medical Work up   Estanislado Emms, LCSW 12/04/2016, 11:07 AM

## 2016-12-05 DIAGNOSIS — F10230 Alcohol dependence with withdrawal, uncomplicated: Principal | ICD-10-CM

## 2016-12-05 DIAGNOSIS — R748 Abnormal levels of other serum enzymes: Secondary | ICD-10-CM

## 2016-12-05 DIAGNOSIS — F10239 Alcohol dependence with withdrawal, unspecified: Secondary | ICD-10-CM

## 2016-12-05 DIAGNOSIS — I10 Essential (primary) hypertension: Secondary | ICD-10-CM

## 2016-12-05 DIAGNOSIS — E876 Hypokalemia: Secondary | ICD-10-CM

## 2016-12-05 LAB — CBC
HEMATOCRIT: 41.9 % (ref 39.0–52.0)
Hemoglobin: 14.2 g/dL (ref 13.0–17.0)
MCH: 32.4 pg (ref 26.0–34.0)
MCHC: 33.9 g/dL (ref 30.0–36.0)
MCV: 95.7 fL (ref 78.0–100.0)
Platelets: 184 10*3/uL (ref 150–400)
RBC: 4.38 MIL/uL (ref 4.22–5.81)
RDW: 12.1 % (ref 11.5–15.5)
WBC: 10 10*3/uL (ref 4.0–10.5)

## 2016-12-05 LAB — COMPREHENSIVE METABOLIC PANEL
ALBUMIN: 3.4 g/dL — AB (ref 3.5–5.0)
ALT: 56 U/L (ref 17–63)
AST: 41 U/L (ref 15–41)
Alkaline Phosphatase: 67 U/L (ref 38–126)
Anion gap: 7 (ref 5–15)
CHLORIDE: 101 mmol/L (ref 101–111)
CO2: 29 mmol/L (ref 22–32)
Calcium: 9 mg/dL (ref 8.9–10.3)
Creatinine, Ser: 0.97 mg/dL (ref 0.61–1.24)
GFR calc Af Amer: >60 mL/min (ref >60)
GFR calc non Af Amer: >60 mL/min (ref >60)
GLUCOSE: 114 mg/dL — AB (ref 65–99)
POTASSIUM: 4 mmol/L (ref 3.5–5.1)
SODIUM: 137 mmol/L (ref 135–145)
Total Bilirubin: 0.5 mg/dL (ref 0.3–1.2)
Total Protein: 6.6 g/dL (ref 6.5–8.1)

## 2016-12-05 MED ORDER — CLONIDINE HCL 0.1 MG PO TABS
0.1000 mg | ORAL_TABLET | Freq: Three times a day (TID) | ORAL | Status: DC
  Administered 2016-12-05 – 2016-12-07 (×6): 0.1 mg via ORAL
  Filled 2016-12-05 (×6): qty 1

## 2016-12-05 MED ORDER — HYDRALAZINE HCL 20 MG/ML IJ SOLN: 10.0000 mg | Freq: Four times a day (QID) | INTRAMUSCULAR | Status: DC | PRN

## 2016-12-05 MED ORDER — METHADONE HCL 10 MG PO TABS
40.0000 mg | ORAL_TABLET | Freq: Every day | ORAL | Status: DC
  Administered 2016-12-06 – 2016-12-07 (×2): 40 mg via ORAL
  Filled 2016-12-05 (×2): qty 4

## 2016-12-05 NOTE — Progress Notes (Signed)
[facility] PROGRESS NOTE  Peter [name] RUE:[id number] DOB: 06-Mar-1980 DOA: 12/02/2016 PCP: Massie Maroon, FNP  Brief summary   36 y.o.malewith medical history significant of opioid and alcohol abuse; who presents requesting alcohol detoxification. Patient reports drinking 1/5 of whiskey daily on average, but last drank 3 beers early 10/13 morning at around 6 AM.   Assessment & Plan: Principal Problem:   Alcohol withdrawal (HCC) Active Problems:   Elevated liver enzymes   Methadone use (HCC)   Essential hypertension   Hypokalemia   Leukocytosis   Alcohol intoxication with withdrawal, alcohol dependence. Improving with  CIWA, no further visual/auditory/tactile hallucinations. Will likely d/c ciwa in 24 hrs -planned outpatient rehab admission at discharge   Hypertension. Uncontrolled. Will increase clonidine to tid, continue losartan. Titrate med as needed  Elevated LFTs. LFTS overall improving  Chronic pain. on methadone, patient is trying to come off of it, he was on 95 mg prior to admission and was placed on 40 mg, then increased to 60 on 11/15 -he is stable. D/w patient his father at the bedside. Will decrease to 40 mg again (outpatient rehab facility acceptance also requires methadone to be at 40 mg). He plans outpatient detox at discharge   GAD. patient with anxiety disorder, asks for treatment. He was told to avoid benzodiazepines in the past, I agree as has high risk of misuse. Started Effexor  Hyponatremia. Likely due to alcohol abuse/dehydration, normalized with fluids  Hypokalemia. Continue to monitor and replace as needed, recheck in am   Dispo: d/c iv fluids.  likely 24-48 hrs if no further withdrawals. Outpatient/detox rehab at discharge. He is discussing with [facility] at [facility]  DVT prophylaxis: Lovenox Code Status: Full code Family Communication: father at bedside  Disposition Plan: transfer to telemetry today    Consultants:   None   Procedures:   None   Antimicrobials:  None    HPI/Subjective: He reports feeling much better. No hallucinations. Mild tachycardia and hypertensive. Will likely d/c ciwa in 24 hrs   Objective: Vitals:   12/04/16 2346 12/05/16 0954  BP: (!) 156/116 (!) 168/118  Pulse:    Resp: 13 (!) 32  Temp:    SpO2:      Intake/Output Summary (Last 24 hours) at 12/05/16 1128 Last data filed at 12/05/16 0842  Gross per 24 hour  Intake              702 ml  Output                0 ml  Net              702 ml   Filed Weights   12/02/16 1237  Weight: 87.1 kg (192 lb)    Exam:   General:  No distress   Cardiovascular: s1,s2 rrr  Respiratory: CTA BL  Abdomen: soft, nt, nd   Musculoskeletal: no leg edema    Data Reviewed: Basic Metabolic Panel:  Recent Labs Lab 12/02/16 1252 12/03/16 0406 12/05/16 0336  NA 132* 135 137  K 3.3* 4.1 4.0  CL 94* 100* 101  CO2 GLUCOSE 112* 109* 114*  BUN 5* 5* <5*  CREATININE 0.96 0.92 0.97  CALCIUM 9.4 8.3* 9.0   Liver Function Tests:  Recent Labs Lab 12/02/16 1252 12/03/16 0406 12/05/16 0336  AST 154* 77* 41  ALT 117* 73* 56  ALKPHOS 78 57 67  BILITOT 0.9 1.8* 0.5  PROT 8.3* 6.2* 6.6  ALBUMIN 4.3  3.3* 3.4*   No results for input(s): LIPASE, AMYLASE in the last 168 hours. No results for input(s): AMMONIA in the last 168 hours. CBC:  Recent Labs Lab 12/02/16 1252 12/03/16 0406 12/05/16 0336  WBC 12.0* 9.6 10.0  HGB 17.5* 14.0 14.2  HCT 50.0 41.6 41.9  MCV 94.5 96.1 95.7  PLT 276 203 184   Cardiac Enzymes: No results for input(s): CKTOTAL, CKMB, CKMBINDEX, TROPONINI in the last 168 hours. BNP (last 3 results) No results for input(s): BNP in the last 8760 hours.  ProBNP (last 3 results) No results for input(s): PROBNP in the last 8760 hours.  CBG: No results for input(s): GLUCAP in the last 168 hours.  No results found for this or any previous visit (from the past 240  hour(s)).   Studies: No results found.  Scheduled Meds: . cloNIDine  0.1 mg Oral BID  . enoxaparin (LOVENOX) injection  40 mg Subcutaneous Daily  . folic acid  1 mg Oral Daily  . LORazepam  0-4 mg Intravenous Q12H  . losartan  25 mg Oral Daily  . methadone  60 mg Oral Daily  . multivitamin with minerals  1 tablet Oral Daily  . pantoprazole  40 mg Oral BID  . thiamine  100 mg Oral Daily  . venlafaxine XR  37.5 mg Oral Q breakfast   Continuous Infusions: . sodium chloride 75 mL/hr at 12/02/16 2208    Principal Problem:   Alcohol withdrawal (HCC) Active Problems:   Elevated liver enzymes   Methadone use (HCC)   Essential hypertension   Hypokalemia   Leukocytosis    Time spent: >35 minutes     Esperanza Sheets  [facility] Pager [phone]. If 7PM-7AM, please contact night-coverage at [email or web address], password [facility] 12/05/2016, 11:28 AM  LOS: 3 days

## 2016-12-06 MED ORDER — NICOTINE 14 MG/24HR TD PT24
14.0000 mg | MEDICATED_PATCH | Freq: Every day | TRANSDERMAL | Status: DC
  Administered 2016-12-06 – 2016-12-07 (×2): 14 mg via TRANSDERMAL
  Filled 2016-12-06 (×2): qty 1

## 2016-12-06 NOTE — Progress Notes (Signed)
PROGRESS NOTE  Peter [name] XLK:[id number] DOB: 05/02/1980 DOA: 12/02/2016 PCP: Massie Maroon, FNP  HPI/Recap of past 24 hours: Peter [name] is a 36 year old male with medical history significant for opioid and alcohol abuse who presented on 12/02/16 requesting alcohol detoxification.  He presented in active withdrawal associated with hypertension, tachycardia, nausea and vomiting.  During his admission his clonidine has been titrated to improve control of hypertension and alcohol withdrawal.  Today patient states he is feeling much better.  He reiterates that he and his father would like to attend a detox center.  Denies any chest pain shortness of breath visual or tactile hallucinations.  Assessment/Plan: Principal Problem:   Alcohol withdrawal (HCC) Active Problems:   Elevated liver enzymes   Methadone use (HCC)   Essential hypertension   Hypokalemia   Leukocytosis  Alcohol withdrawal, resolved -Improved on CWIA protocol. Plan - Continue scheduled folate and thiamine, and multivitamin supplementation  Hypertension, uncontrolled, asymptomatic -Patient only taking clonidine 0.1 twice a day at home that has since been titrated to 3 times a day -Significant diastolic hypertension could consider hypothyroidism though patient not having any symptoms that would suggest that Plan -Check TSH - Continue clonidine and losartan  Chronic pain Plan -Continue decreased dosage of methadone 40 mg. -Patient will continue to plan for outpatient detox on discharge   Elevated liver enzymes, resolved -AST and ALT transiently peaked on day of admission.  Previous hepatitis labs from August 2018 were negative -Could be related to alcohol withdrawal though would expect alcoholic hepatitis to have ALT be twice the value of AST    Code Status: Full code  Family Communication: No family at bedside discussed with patient  Disposition Plan: Plan for discharge tomorrow if blood  pressure is fairly controlled.  Patient will need PCP follow-up for further blood pressure titration   Consultants:  None  Procedures:  None  Antimicrobials:  None  DVT prophylaxis: Lovenox   Objective: Vitals:   12/06/16 1431 12/06/16 1628 12/06/16 1630 12/06/16 2020  BP: (!) 142/100  (!) 169/104 (!) 171/119  Pulse: 84 89 85 94  Resp: 20  18 (!) 22  Temp:   (!) 97.4 F (36.3 C) 98.2 F (36.8 C)  TempSrc:  Oral Oral Oral  SpO2: 100%  100% 98%  Weight:      Height:       No intake or output data in the 24 hours ending 12/06/16 2057 Filed Weights   12/02/16 1237  Weight: 87.1 kg (192 lb)    Exam:   General: Lying in bed comfortably, in no apparent distress, pleasant in conversation  Cardiovascular: Regular rate and rhythm, no murmurs rubs or gallops  Respiratory: Lungs clear to auscultation bilaterally  Abdomen: Soft nondistended nontender  Neurologic: No tremors, alert and oriented x4  Psychiatry: Normal affect and mood   Data Reviewed: CBC:  Recent Labs Lab 12/02/16 1252 12/03/16 0406 12/05/16 0336  WBC 12.0* 9.6 10.0  HGB 17.5* 14.0 14.2  HCT 50.0 41.6 41.9  MCV 94.5 96.1 95.7  PLT 276 203 184   Basic Metabolic Panel:  Recent Labs Lab 12/02/16 1252 12/03/16 0406 12/05/16 0336  NA 132* 135 137  K 3.3* 4.1 4.0  CL 94* 100* 101  CO2 24 26 29   GLUCOSE 112* 109* 114*  BUN 5* 5* <5*  CREATININE 0.96 0.92 0.97  CALCIUM 9.4 8.3* 9.0   GFR: Estimated Creatinine Clearance: 113.2 mL/min (by C-G formula based on SCr of 0.97 mg/dL). Liver Function Tests:  Recent Labs Lab 12/02/16 1252 12/03/16 0406 12/05/16 0336  AST 154* 77* 41  ALT 117* 73* 56  ALKPHOS 78 57 67  BILITOT 0.9 1.8* 0.5  PROT 8.3* 6.2* 6.6  ALBUMIN 4.3 3.3* 3.4*   No results for input(s): LIPASE, AMYLASE in the last 168 hours. No results for input(s): AMMONIA in the last 168 hours. Coagulation Profile: No results for input(s): INR, PROTIME in the last 168  hours. Cardiac Enzymes: No results for input(s): CKTOTAL, CKMB, CKMBINDEX, TROPONINI in the last 168 hours. BNP (last 3 results) No results for input(s): PROBNP in the last 8760 hours. HbA1C: No results for input(s): HGBA1C in the last 72 hours. CBG: No results for input(s): GLUCAP in the last 168 hours. Lipid Profile: No results for input(s): CHOL, HDL, LDLCALC, TRIG, CHOLHDL, LDLDIRECT in the last 72 hours. Thyroid Function Tests: No results for input(s): TSH, T4TOTAL, FREET4, T3FREE, THYROIDAB in the last 72 hours. Anemia Panel: No results for input(s): VITAMINB12, FOLATE, FERRITIN, TIBC, IRON, RETICCTPCT in the last 72 hours. Urine analysis:    Component Value Date/Time   COLORURINE YELLOW 12/02/2016 0550   APPEARANCEUR HAZY (A) 12/02/2016 0550   LABSPEC 1.010 12/02/2016 0550   PHURINE 6.0 12/02/2016 0550   GLUCOSEU NEGATIVE 12/02/2016 0550   HGBUR NEGATIVE 12/02/2016 0550   BILIRUBINUR NEGATIVE 12/02/2016 0550   KETONESUR NEGATIVE 12/02/2016 0550   PROTEINUR NEGATIVE 12/02/2016 0550   UROBILINOGEN 0.2 11/13/2016 1234   NITRITE NEGATIVE 12/02/2016 0550   LEUKOCYTESUR MODERATE (A) 12/02/2016 0550   Sepsis Labs: @LABRCNTIP (procalcitonin:4,lacticidven:4)  )No results found for this or any previous visit (from the past 240 hour(s)).    Studies: No results found.  Scheduled Meds: . cloNIDine  0.1 mg Oral TID  . enoxaparin (LOVENOX) injection  40 mg Subcutaneous Daily  . folic acid  1 mg Oral Daily  . losartan  25 mg Oral Daily  . methadone  40 mg Oral Daily  . multivitamin with minerals  1 tablet Oral Daily  . nicotine  14 mg Transdermal Daily  . pantoprazole  40 mg Oral BID  . thiamine  100 mg Oral Daily  . venlafaxine XR  37.5 mg Oral Q breakfast    Continuous Infusions:   LOS: 4 days     Laverna PeaceShayla D Jaken Fregia, MD [facility] Pager [phone]  If 7PM-7AM, please contact night-coverage [email or web address] Password [email or web address] 12/06/2016, 8:57 PM

## 2016-12-07 LAB — TSH: TSH: 2.824 u[IU]/mL (ref 0.350–4.500)

## 2016-12-07 MED ORDER — METHADONE HCL 10 MG PO TABS: 40.0000 mg | ORAL_TABLET | Freq: Every day | ORAL | 0 refills | Status: DC

## 2016-12-07 MED ORDER — CLONIDINE HCL 0.1 MG PO TABS: 0.1000 mg | ORAL_TABLET | Freq: Three times a day (TID) | ORAL | 2 refills | Status: DC

## 2016-12-07 MED ORDER — ADULT MULTIVITAMIN W/MINERALS CH: 1.0000 | ORAL_TABLET | Freq: Every day | ORAL | 0 refills | Status: AC

## 2016-12-07 MED ORDER — CLONIDINE HCL 0.1 MG PO TABS: 0.1000 mg | ORAL_TABLET | Freq: Every day | ORAL | 2 refills | Status: DC

## 2016-12-07 MED ORDER — VENLAFAXINE HCL ER 37.5 MG PO CP24: 37.5000 mg | ORAL_CAPSULE | Freq: Every day | ORAL | 1 refills | Status: DC

## 2016-12-07 MED ORDER — LOSARTAN POTASSIUM 50 MG PO TABS
50.0000 mg | ORAL_TABLET | Freq: Every day | ORAL | Status: DC
  Administered 2016-12-07: 50 mg via ORAL
  Filled 2016-12-07: qty 1

## 2016-12-07 MED ORDER — LOSARTAN POTASSIUM 50 MG PO TABS: 50.0000 mg | ORAL_TABLET | Freq: Every day | ORAL | 1 refills | Status: DC

## 2016-12-07 MED ORDER — NICOTINE 14 MG/24HR TD PT24: 14.0000 mg | MEDICATED_PATCH | Freq: Every day | TRANSDERMAL | 0 refills | Status: DC

## 2016-12-07 MED FILL — LOSARTAN POTASSIUM 50 MG TA: 50 | 30 days supply | Qty: 30 | Fill #0

## 2016-12-07 MED FILL — ?CLONIDINE HCL 0.1 MG TABL: 0.1 | 10 days supply | Qty: 30 | Fill #0

## 2016-12-07 MED FILL — VENLAFAXINE HCL ER 37.5 MG: 37.5 | 30 days supply | Qty: 30 | Fill #0

## 2016-12-07 NOTE — Discharge Summary (Signed)
Discharge Summary  Lucy Woolever OZH:[id number] DOB: [birth year]  PCP: Massie Maroon, FNP  Admit date: 12/02/2016 Discharge date: 12/07/2016  Time spent: 25 minutes  Recommendations for Outpatient Follow-up:  1. Losartan increased to 50 mg , clonidine to 0.1 three times daily for better blood pressure control 2. Although methadone dose was reduced to 40 mg during hospitalization patient will need to discuss resumption with previous provider   Discharge Diagnoses:  Active Hospital Problems   Diagnosis Date Noted  . Alcohol withdrawal (HCC) 12/02/2016  . Hypokalemia 12/03/2016  . Leukocytosis 12/03/2016  . 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 Essential hypertension 11/18/2016  . Elevated liver enzymes 11/14/2016    Resolved Hospital Problems   Diagnosis Date Noted Date Resolved  No resolved problems to display.    Discharge Condition: Stable  Diet recommendation: Regular  Vitals:   12/06/16 2142 12/07/16 0338  BP: (!) 160/112 (!) 152/113  Pulse:  (!) 105  Resp:  20  Temp:  97.6 F (36.4 C)  SpO2:      History of present illness:  Mr. Asato is a 36 year old male with medical history significant for opioid and alcohol abuse who presented on 12/02/16 requesting alcohol detoxification.  He presented in active withdrawal associated with hypertension, tachycardia, nausea and vomiting.     Hospital Course:  Principal Problem:   Alcohol withdrawal (HCC) Active Problems:   Elevated liver enzymes   Methadone use (HCC)   Essential hypertension   Hypokalemia   Leukocytosis  Alcohol intoxication with withdrawal  Patient reported drinking 1/5 of vodka daily.  He was accompanied by his father for admission, as he requested alcohol detox.  During hospital course, tachycardia, anxiety, and agitation resolved while monitoring on CIWA protocol (ativan).  Patient was initially admitted to stepdown unit while monitored on CIWA protocol.  Treated with supplementation with  multivitamin, folic acid, and thiamine.  Withdrawal symptoms resolved with supportive care and was successfully able to wean off Ativan within 24 hours prior to discharge.  Hypertension, stage II Initially hypotension attributed to alcohol withdrawal.  After symptoms of alcohol withdrawal subsided patient blood pressure regimen was optimized clonidine 0.13 times a day and losartan 50 mg daily.  Discussed with patient and father importance of following up with primary care physician for further blood pressure titration.  Patient remained asymptomatic with no signs of endorgan damage.  Note TSH was normal so not likely etiology of combined systolic/diastolic hypertension.  No hypokalemia or other clues to signify other etiology instead of essential hypertension  Chronic pain During admission methadone decreased to 40 mg.  However patient will not be prescribed methadone, he will need to continue optimization of medication with prescribing provider as an outpatient.  No prescription provided on discharge.   Elevated liver enzymes, resolved AST and ALT transiently peaked on day of admission.  Previous hepatitis labs from August 2018 were negative. Could be related to alcohol withdrawal though would expect alcoholic hepatitis to have ALT be twice the value of AST.  Patient remained without abdominal complaints.  No other abnormalities in hepatic labs.  Procedures:  None  Consultations:  None  Discharge Exam: BP (!) 152/113 (BP Location: Left Arm)   Pulse (!) 105   Temp 97.6 F (36.4 C) (Oral)   Resp 20   Ht 5\' 11"  (1.803 m)   Wt 87.1 kg (192 lb)   SpO2 98%   BMI 26.78 kg/m   General: Lying in bed comfortably, in no apparent distress, pleasant conversation Cardiovascular: Regular  rate and rhythm, no murmurs rubs or gallops, no JVD, no peripheral edema Respiratory: Lungs clear to auscultation bilaterally on anterior chest field, no rales rhonchi or wheezes Neurologic: No tremors   Psychologic: Normal affect not anxious  Discharge Instructions You were cared for by a hospitalist during your hospital stay. If you have any questions about your discharge medications or the care you received while you were in the hospital after you are discharged, you can call the unit and asked to speak with the hospitalist on call if the hospitalist that took care of you is not available. Once you are discharged, your primary care physician will handle any further medical issues. Please note that NO REFILLS for any discharge medications will be authorized once you are discharged, as it is imperative that you return to your primary care physician (or establish a relationship with a primary care physician if you do not have one) for your aftercare needs so that they can reassess your need for medications and monitor your lab values.  Discharge Instructions    Diet - low sodium heart healthy    Complete by:  As directed    Increase activity slowly    Complete by:  As directed      Allergies as of 12/07/2016   No Known Allergies     Medication List    TAKE these medications   cloNIDine 0.1 MG tablet Commonly known as:  CATAPRES Take 1 tablet (0.1 mg total) by mouth 3 (three) times daily. What changed:  when to take this   folic acid 1 MG tablet Commonly known as:  FOLVITE Take 1 tablet (1 mg total) by mouth daily.   losartan 50 MG tablet Commonly known as:  COZAAR Take 1 tablet (50 mg total) by mouth daily. What changed:  medication strength  how much to take   methadone 10 MG tablet Commonly known as:  DOLOPHINE Take 4 tablets (40 mg total) by mouth daily. Patient to follow up with PCP to continue original prescription. No script given What changed:  medication strength  how much to take  additional instructions   multivitamin with minerals Tabs tablet Take 1 tablet by mouth daily.   nicotine 14 mg/24hr patch Commonly known as:  NICODERM CQ - dosed in mg/24  hours Place 1 patch (14 mg total) onto the skin daily.   OVER THE COUNTER MEDICATION Take 4 capsules by mouth See admin instructions. Juice plus -  Take 2 juice capsules and 2 vegetable capsules every morning   thiamine 100 MG tablet Commonly known as:  VITAMIN B-1 Take 1 tablet (100 mg total) by mouth daily.   venlafaxine XR 37.5 MG 24 hr capsule Commonly known as:  EFFEXOR-XR Take 1 capsule (37.5 mg total) by mouth daily with breakfast.      No Known Allergies    The results of significant diagnostics from this hospitalization (including imaging, microbiology, ancillary and laboratory) are listed below for reference.    Significant Diagnostic Studies: No results found.  Microbiology: No results found for this or any previous visit (from the past 240 hour(s)).   Labs: Basic Metabolic Panel:  Recent Labs Lab 12/02/16 1252 12/03/16 0406 12/05/16 0336  NA 132* 135 137  K 3.3* 4.1 4.0  CL 94* 100* 101  CO2 24 26 29   GLUCOSE 112* 109* 114*  BUN 5* 5* <5*  CREATININE 0.96 0.92 0.97  CALCIUM 9.4 8.3* 9.0   Liver Function Tests:  Recent Labs Lab 12/02/16 1252  12/03/16 0406 12/05/16 0336  AST 154* 77* 41  ALT 117* 73* 56  ALKPHOS 78 57 67  BILITOT 0.9 1.8* 0.5  PROT 8.3* 6.2* 6.6  ALBUMIN 4.3 3.3* 3.4*   No results for input(s): LIPASE, AMYLASE in the last 168 hours. No results for input(s): AMMONIA in the last 168 hours. CBC:  Recent Labs Lab 12/02/16 1252 12/03/16 0406 12/05/16 0336  WBC 12.0* 9.6 10.0  HGB 17.5* 14.0 14.2  HCT 50.0 41.6 41.9  MCV 94.5 96.1 95.7  PLT 276 203 184   Cardiac Enzymes: No results for input(s): CKTOTAL, CKMB, CKMBINDEX, TROPONINI in the last 168 hours. BNP: BNP (last 3 results) No results for input(s): BNP in the last 8760 hours.  ProBNP (last 3 results) No results for input(s): PROBNP in the last 8760 hours.  CBG: No results for input(s): GLUCAP in the last 168 hours.     Signed:  Laverna PeaceShayla D Nettey,  MD [facility] 12/07/2016, 2:16 PM

## 2016-12-07 NOTE — Care Management Note (Addendum)
Case Management Note Donn PieriniKristi Andree Heeg RN, BSN Unit 4E-Case Manager [phone]  Patient Details  Name: Peter StairsBrandon Showman MRN: [id number] Date of Birth: 1981/01/27  Subjective/Objective:  Pt admitted with ETOH/opiod withdrawal- HTN and tachycardia                  Action/Plan: PTA pt lived at home with dad- pt was being following by a Methadone clinic- CSW assisted with rehab options for detox-- pt for d/c home today-  Expected Discharge Date:  12/07/16               Expected Discharge Plan:  Home/Self Care  In-House Referral:  Clinical Social Work  Discharge planning Services  CM Consult  Post Acute Care Choice:  NA Choice offered to:  NA  DME Arranged:  N/A DME Agency:  NA  HH Arranged:  NA HH Agency:  NA  Status of Service:  Completed, signed off  If discussed at Long Length of Stay Meetings, dates discussed:    Discharge Disposition: home/self care  Additional Comments:  Darrold SpanWebster, Kelvin Burpee Hall, RN 12/07/2016, 12:36 PM

## 2016-12-14 ENCOUNTER — Encounter: Payer: Self-pay | Admitting: Family Medicine

## 2016-12-14 ENCOUNTER — Ambulatory Visit (INDEPENDENT_AMBULATORY_CARE_PROVIDER_SITE_OTHER): Payer: Self-pay | Admitting: Family Medicine

## 2016-12-14 VITALS — BP 158/98 | HR 96 | Temp 98.8°F | Resp 14 | Ht 71.0 in | Wt 191.0 lb

## 2016-12-14 DIAGNOSIS — I10 Essential (primary) hypertension: Secondary | ICD-10-CM

## 2016-12-14 DIAGNOSIS — F10288 Alcohol dependence with other alcohol-induced disorder: Secondary | ICD-10-CM

## 2016-12-14 MED ORDER — CLONIDINE HCL 0.1 MG PO TABS: 0.1000 mg | ORAL_TABLET | Freq: Three times a day (TID) | ORAL | 0 refills | Status: DC

## 2016-12-14 MED ORDER — LOSARTAN POTASSIUM 100 MG PO TABS: 50.0000 mg | ORAL_TABLET | Freq: Every day | ORAL | 0 refills | Status: DC

## 2016-12-14 MED FILL — ?CLONIDINE HCL 0.1 MG TABL: 0.1 | 30 days supply | Qty: 90 | Fill #0

## 2016-12-14 NOTE — Progress Notes (Signed)
Subjective:    Patient ID: Peter [name], male    DOB: 08-01-1980, 36 y.o.   MRN: [id number]  HPI Peter [name], a 36 year old male that presents for a follow up of hypertension. Patient has a history of alcohol dependence and Methadone use. He was recently admitted to inpatient services for alcohol dependence. He says that he has not had a drink in 14 days.  He is currently a patient of [facility] daily and methadone use has decreased to 115 mg per day. He sees a Veterinary surgeon at [facility] in [place].   Patient is here for a follow up of hypertension. He has been taking Losartan and Clonidine consistently. Clonidine was increased during inpatient admission. Marland Kitchen He  denies chest pain, dyspnea, irregular heart beat, orthopnea, palpitations, syncope and tachypnea.  Cardiovascular risk factors include: sedentary lifestyle and smoking/ tobacco exposure.     Past Medical History:  Diagnosis Date  . Alcoholism (HCC)   . Substance abuse [facility])    Social History   Social History  . Marital status: Single    Spouse name: N/A  . Number of children: N/A  . Years of education: N/A   Occupational History  . Not on file.   Social History Main Topics  . Smoking status: Current Every Day Smoker    Packs/day: 0.50    Types: Cigarettes  . Smokeless tobacco: Never Used  . Alcohol use Yes     Comment: fifth a day whiskey. nothing in 3 days (10/13/2016)  . Drug use: No  . Sexual activity: Not on file   Other Topics Concern  . Not on file   Social History Narrative  . No narrative on file   Immunization History  Administered Date(s) Administered  . Tdap 10/31/2011   No Known Allergies   Review of Systems  Constitutional: Negative for fatigue.  Eyes: Negative for photophobia, pain and redness.  Respiratory: Negative.   Cardiovascular: Negative for leg swelling.  Gastrointestinal: Negative.   Endocrine: Negative for polydipsia, polyphagia and  polyuria.  Genitourinary: Negative.   Musculoskeletal: Negative.   Skin: Negative.   Neurological: Negative.   Hematological: Negative.   Psychiatric/Behavioral: Negative for behavioral problems and sleep disturbance.       Objective:   Physical Exam  Constitutional: He is oriented to person, place, and time.  HENT:  Head: Normocephalic and atraumatic.  Right Ear: External ear normal.  Left Ear: External ear normal.  Nose: Nose normal.  Mouth/Throat: Oropharynx is clear and moist.  Eyes: Pupils are equal, round, and reactive to light. Conjunctivae and EOM are normal.  Neck: Normal range of motion. Neck supple.  Pulmonary/Chest: Effort normal and breath sounds normal.  Abdominal: Soft. Bowel sounds are normal.  Musculoskeletal: Normal range of motion.  Neurological: He is alert and oriented to person, place, and time. He displays tremor. He displays no atrophy. No cranial nerve deficit. He exhibits normal muscle tone. Coordination and gait normal.  Fine tremor   Skin: Skin is warm and dry.  Psychiatric: His behavior is normal. Judgment and thought content normal. His speech is not rapid and/or pressured. He is not agitated. He does not exhibit a depressed mood. He expresses no homicidal and no suicidal ideation.    BP (!) 160/104 Comment: manually  Pulse 96   Temp 98.8 F (37.1 C) (Oral)   Resp 14   Ht 5\' 11"  (1.803 m)   Wt 191 lb (86.6 kg)   SpO2 99%  BMI 26.64 kg/m   Assessment & Plan:  1. Essential hypertension Blood pressure is above goal on current medication regimne Will increase Losartan to 100 mg and continue Clonidine as prescribed Continue to check blood pressures at home and maintain diary - losartan (COZAAR) 100 MG tablet; Take 0.5 tablets (50 mg total) by mouth daily.  Dispense: 30 tablet; Refill: 0 - cloNIDine (CATAPRES) 0.1 MG tablet; Take 1 tablet (0.1 mg total) by mouth 3 (three) times daily.  Dispense: 90 tablet; Refill: 0  2. Alcohol dependence with  other alcohol-induced disorder [facility](HCC) Continue treatment program.  Discussed the importance of attending meetings.  Discussed the importance of decreasing Methadone use per clinic.  Was unable to find an inpatient treatment facility due to daily methadone.      RTC: Will follow up for hypertension in 1 month   Ardyth Kelso Rennis PettyMoore Fusaye Wachtel  MSN, FNP-C Patient [facility] [address]  [place], [place] [id number] [phone]

## 2017-01-17 ENCOUNTER — Ambulatory Visit: Payer: Self-pay | Admitting: Family Medicine

## 2017-01-20 ENCOUNTER — Other Ambulatory Visit: Payer: Self-pay

## 2017-01-20 ENCOUNTER — Emergency Department (HOSPITAL_COMMUNITY)
Admission: EM | Admit: 2017-01-20 | Discharge: 2017-01-20 | Disposition: A | Discharge status: Home / Self Care | Payer: Self-pay | Attending: Emergency Medicine | Admitting: Emergency Medicine

## 2017-01-20 ENCOUNTER — Emergency Department (HOSPITAL_COMMUNITY): Payer: Self-pay

## 2017-01-20 ENCOUNTER — Encounter (HOSPITAL_COMMUNITY): Payer: Self-pay | Admitting: Nurse Practitioner

## 2017-01-20 DIAGNOSIS — Z79899 Other long term (current) drug therapy: Secondary | ICD-10-CM | POA: Insufficient documentation

## 2017-01-20 DIAGNOSIS — F10239 Alcohol dependence with withdrawal, unspecified: Secondary | ICD-10-CM | POA: Insufficient documentation

## 2017-01-20 DIAGNOSIS — F17210 Nicotine dependence, cigarettes, uncomplicated: Secondary | ICD-10-CM | POA: Insufficient documentation

## 2017-01-20 DIAGNOSIS — F1193 Opioid use, unspecified with withdrawal: Secondary | ICD-10-CM

## 2017-01-20 DIAGNOSIS — K2920 Alcoholic gastritis without bleeding: Secondary | ICD-10-CM | POA: Insufficient documentation

## 2017-01-20 DIAGNOSIS — F1123 Opioid dependence with withdrawal: Secondary | ICD-10-CM

## 2017-01-20 DIAGNOSIS — R079 Chest pain, unspecified: Secondary | ICD-10-CM | POA: Insufficient documentation

## 2017-01-20 DIAGNOSIS — I10 Essential (primary) hypertension: Secondary | ICD-10-CM | POA: Insufficient documentation

## 2017-01-20 DIAGNOSIS — F1120 Opioid dependence, uncomplicated: Secondary | ICD-10-CM | POA: Insufficient documentation

## 2017-01-20 DIAGNOSIS — F10939 Alcohol use, unspecified with withdrawal, unspecified: Secondary | ICD-10-CM

## 2017-01-20 LAB — COMPREHENSIVE METABOLIC PANEL
ALT: 41 U/L (ref 17–63)
ANION GAP: 15 (ref 5–15)
AST: 55 U/L — ABNORMAL HIGH (ref 15–41)
Albumin: 4.7 g/dL (ref 3.5–5.0)
Alkaline Phosphatase: 70 U/L (ref 38–126)
BUN: 9 mg/dL (ref 6–20)
CHLORIDE: 94 mmol/L — AB (ref 101–111)
CO2: 25 mmol/L (ref 22–32)
Calcium: 9.6 mg/dL (ref 8.9–10.3)
Creatinine, Ser: 0.9 mg/dL (ref 0.61–1.24)
Glucose, Bld: 109 mg/dL — ABNORMAL HIGH (ref 65–99)
Potassium: 3.9 mmol/L (ref 3.5–5.1)
Sodium: 134 mmol/L — ABNORMAL LOW (ref 135–145)
Total Bilirubin: 1.2 mg/dL (ref 0.3–1.2)
Total Protein: 8.8 g/dL — ABNORMAL HIGH (ref 6.5–8.1)

## 2017-01-20 LAB — CBC
HEMATOCRIT: 46.3 % (ref 39.0–52.0)
Hemoglobin: 16.9 g/dL (ref 13.0–17.0)
MCH: 33.3 pg (ref 26.0–34.0)
MCHC: 36.5 g/dL — ABNORMAL HIGH (ref 30.0–36.0)
MCV: 91.3 fL (ref 78.0–100.0)
Platelets: 239 10*3/uL (ref 150–400)
RBC: 5.07 MIL/uL (ref 4.22–5.81)
RDW: 12.4 % (ref 11.5–15.5)
WBC: 17 10*3/uL — AB (ref 4.0–10.5)

## 2017-01-20 LAB — I-STAT TROPONIN, ED: Troponin i, poc: 0 ng/mL (ref 0.00–0.08)

## 2017-01-20 IMAGING — CR DG CHEST 2V
2 series · 2 of 2 positions shown · non-contrast
Comparison: [DATE]

CLINICAL DATA: Mid chest pain

EXAM:
CHEST  2 VIEW

[w chest pa]
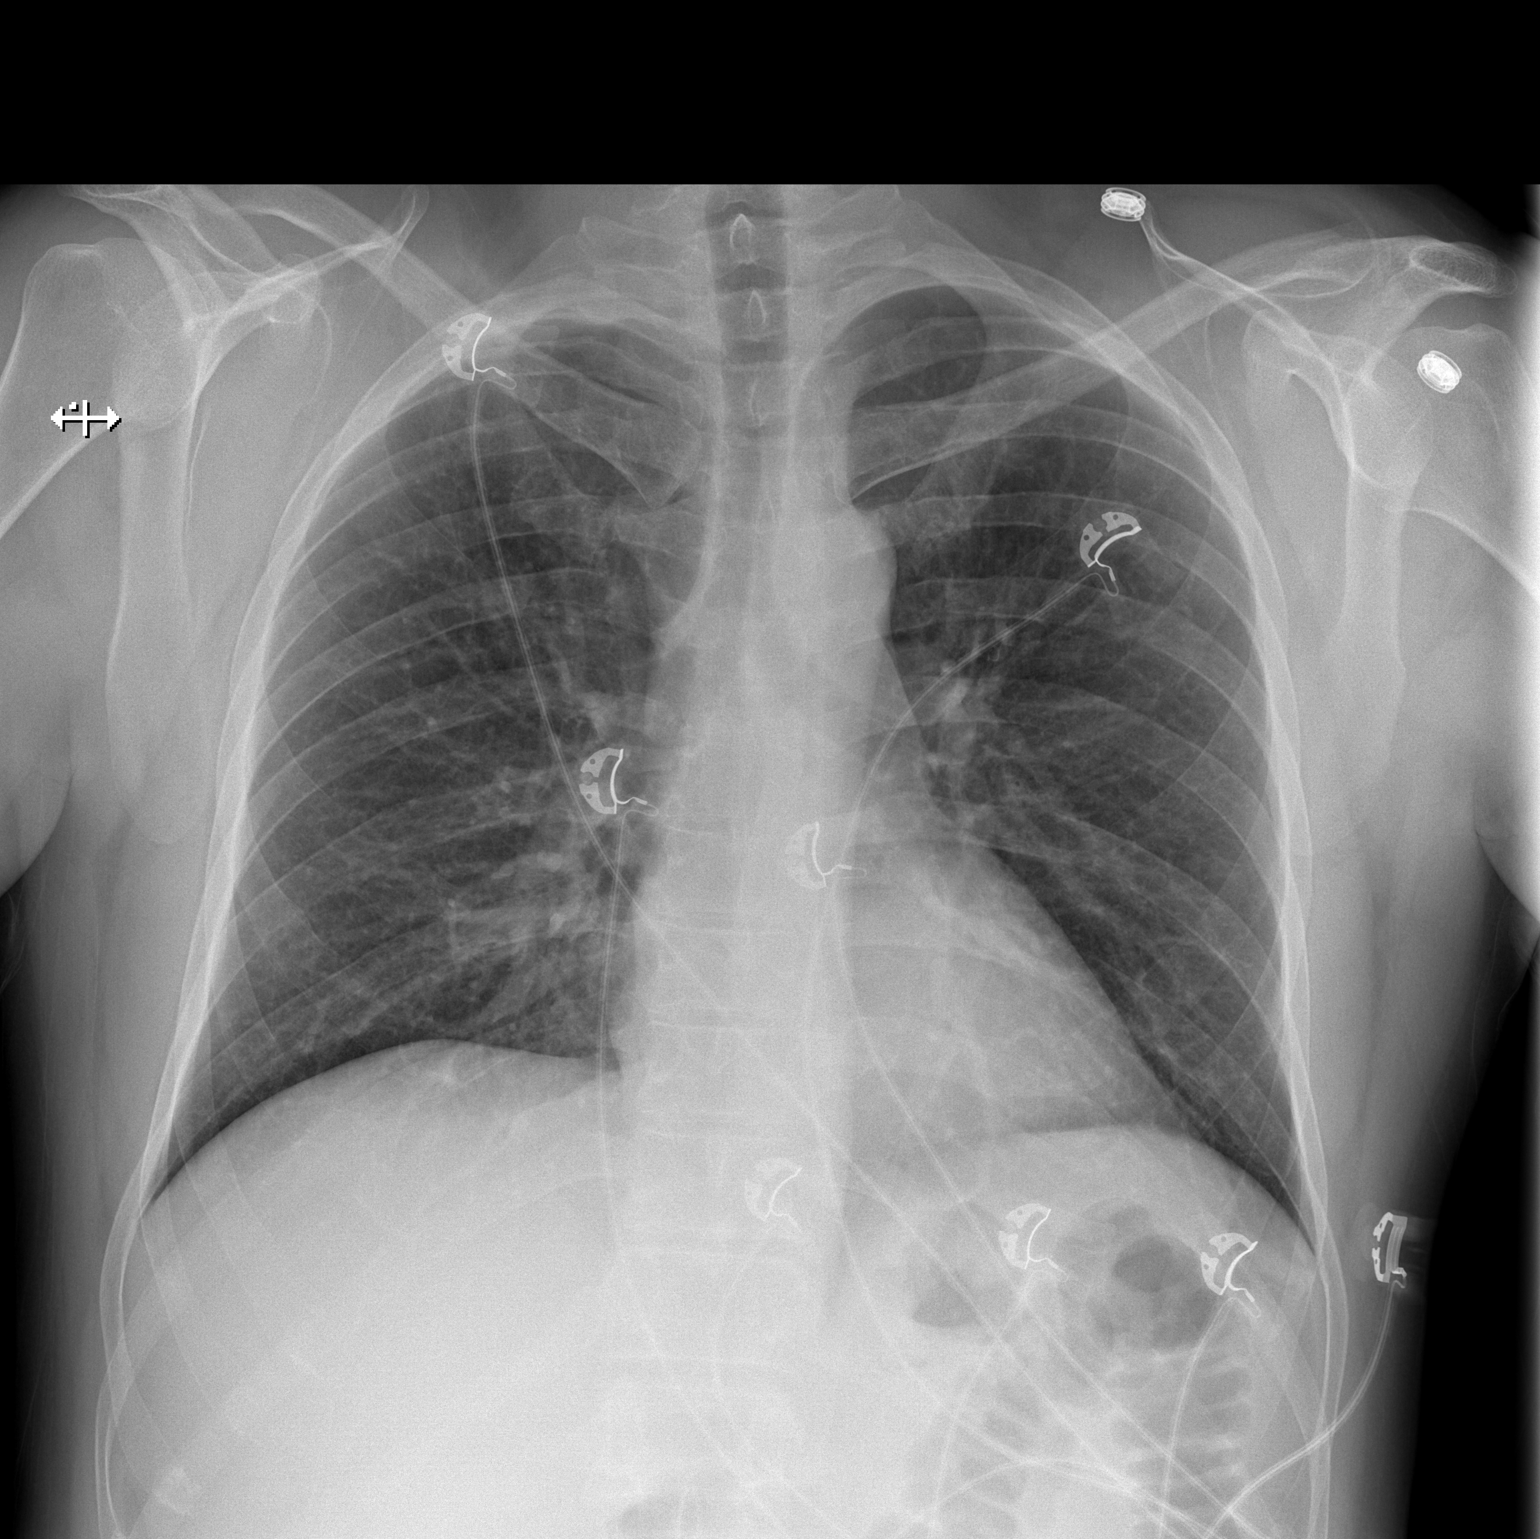

[w chest lat]
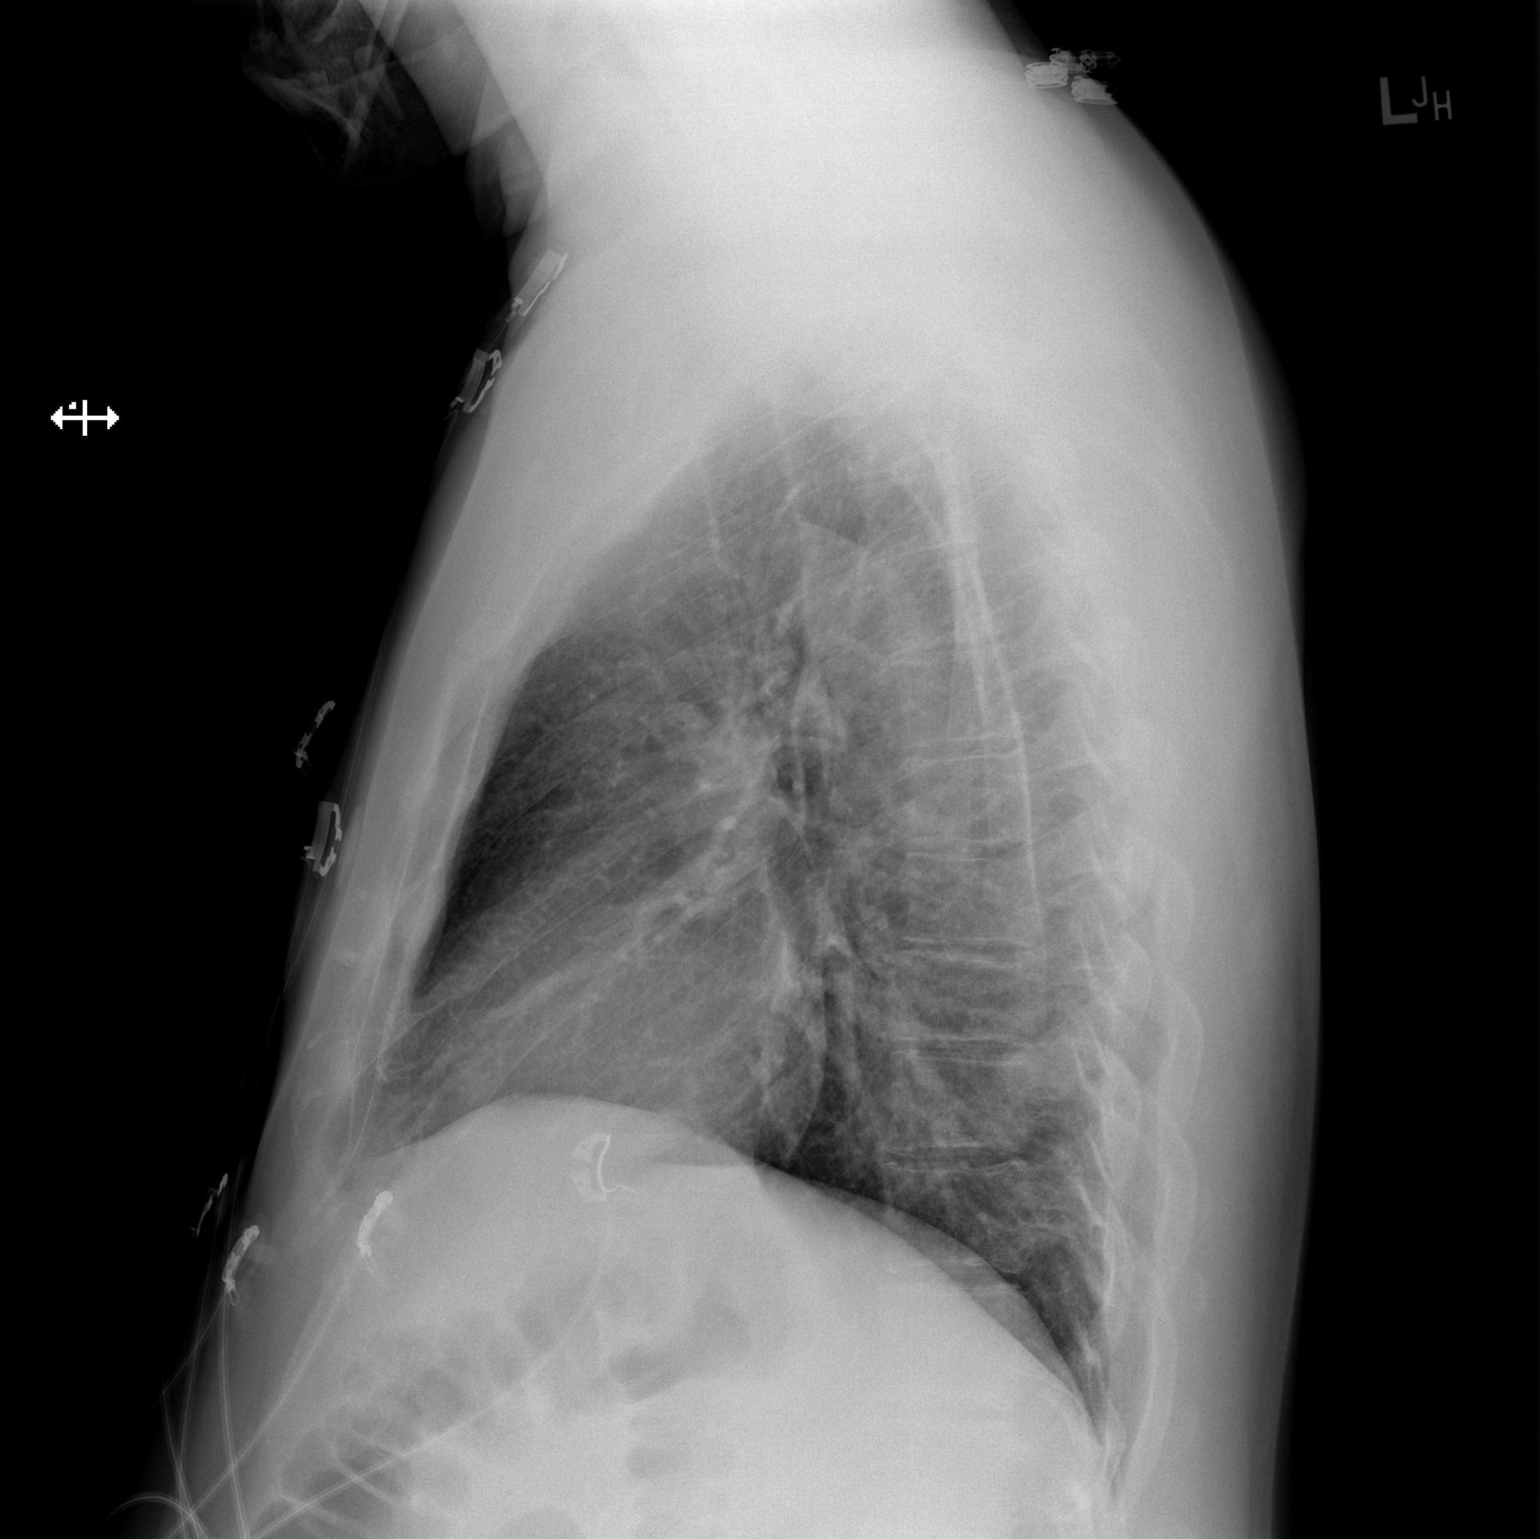

[2 of 2 positions shown; findings below may reference images not displayed]

FINDINGS: Heart and mediastinal contours are within normal limits. No focal
opacities or effusions. No acute bony abnormality.
IMPRESSION: No active cardiopulmonary disease.

## 2017-01-20 MED ORDER — ONDANSETRON 4 MG PO TBDP: 4.0000 mg | ORAL_TABLET | Freq: Three times a day (TID) | ORAL | 0 refills | Status: DC | PRN

## 2017-01-20 MED ORDER — METHOCARBAMOL 500 MG PO TABS: 500.0000 mg | ORAL_TABLET | Freq: Four times a day (QID) | ORAL | 0 refills | Status: DC | PRN

## 2017-01-20 MED ORDER — CLONIDINE HCL 0.1 MG PO TABS
0.1000 mg | ORAL_TABLET | Freq: Once | ORAL | Status: AC
  Administered 2017-01-20: 0.1 mg via ORAL
  Filled 2017-01-20: qty 1

## 2017-01-20 MED ORDER — RANITIDINE HCL 150 MG PO CAPS: 150.0000 mg | ORAL_CAPSULE | Freq: Two times a day (BID) | ORAL | 0 refills | Status: DC

## 2017-01-20 MED ORDER — KETOROLAC TROMETHAMINE 15 MG/ML IJ SOLN
15.0000 mg | Freq: Once | INTRAMUSCULAR | Status: AC
  Administered 2017-01-20: 15 mg via INTRAVENOUS
  Filled 2017-01-20: qty 1

## 2017-01-20 MED ORDER — HYDROXYZINE HCL 25 MG PO TABS: 25.0000 mg | ORAL_TABLET | Freq: Four times a day (QID) | ORAL | 0 refills | Status: DC | PRN

## 2017-01-20 MED ORDER — SODIUM CHLORIDE 0.9 % IV BOLUS (SEPSIS)
1000.0000 mL | Freq: Once | INTRAVENOUS | Status: AC
  Administered 2017-01-20: 1000 mL via INTRAVENOUS

## 2017-01-20 MED ORDER — VENLAFAXINE HCL ER 37.5 MG PO CP24: 37.5000 mg | ORAL_CAPSULE | Freq: Every day | ORAL | 0 refills | Status: DC

## 2017-01-20 MED ORDER — CHLORDIAZEPOXIDE HCL 25 MG PO CAPS: ORAL_CAPSULE | ORAL | 0 refills | Status: DC

## 2017-01-20 MED ORDER — HYDROXYZINE HCL 25 MG PO TABS
25.0000 mg | ORAL_TABLET | Freq: Once | ORAL | Status: AC
  Administered 2017-01-20: 25 mg via ORAL
  Filled 2017-01-20: qty 1

## 2017-01-20 MED ORDER — LOSARTAN POTASSIUM 50 MG PO TABS
50.0000 mg | ORAL_TABLET | Freq: Once | ORAL | Status: AC
  Administered 2017-01-20: 50 mg via ORAL
  Filled 2017-01-20: qty 1

## 2017-01-20 MED ORDER — GI COCKTAIL ~~LOC~~
30.0000 mL | Freq: Once | ORAL | Status: AC
  Administered 2017-01-20: 30 mL via ORAL
  Filled 2017-01-20: qty 30

## 2017-01-20 MED ORDER — CHLORDIAZEPOXIDE HCL 25 MG PO CAPS
50.0000 mg | ORAL_CAPSULE | Freq: Once | ORAL | Status: AC
  Administered 2017-01-20: 50 mg via ORAL
  Filled 2017-01-20: qty 2

## 2017-01-20 MED ORDER — ONDANSETRON HCL 4 MG/2ML IJ SOLN
4.0000 mg | Freq: Once | INTRAMUSCULAR | Status: AC
  Administered 2017-01-20: 4 mg via INTRAVENOUS
  Filled 2017-01-20: qty 2

## 2017-01-20 MED ORDER — LOSARTAN POTASSIUM 50 MG PO TABS: 50.0000 mg | ORAL_TABLET | Freq: Every day | ORAL | 0 refills | Status: DC

## 2017-01-20 NOTE — ED Notes (Signed)
Bed: WA05 Expected date:  Expected time:  Means of arrival:  Comments: 

## 2017-01-20 NOTE — ED Provider Notes (Signed)
[facility] Note   CSN: [id number] Arrival date & time: 01/20/17  0906     History   Chief Complaint Chief Complaint  Patient presents with  . Alcohol Intoxication  . Detox    Methadone     HPI Peter [name] is a 36 y.o. male.  HPI   Presents with concern for detox from methadone and etoh Reports chest pain beginning this morning, mild, dull pain, upper chest. Nothing seems to make it better or worse.  Every once in a while feels tingling in left hand.  No shortness of breath.  Had nausea and vomiting last night, nausea improved now.  Drinking etoh history of withdrawal. Last drink was 2AM. Feels anxious, figity, haven't slept in 30 hours. Hasn't had regular blood pressure or anxiety medication either. Has not had losartan.  Hasn't had effexor in about 2 days as well.  No tremors. No history of seizures from etoh withdrawal. Last used methadone 4 days ago.   Has not had diarrhea.  No pain other than chest pain.   No fevers, no urinary problems.  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 Some depression. No HI or hallucinations.      Past Medical History:  Diagnosis Date  . Alcoholism (HCC)   . Substance abuse [facility])     Patient Active Problem List   Diagnosis Date Noted  . Hypokalemia 12/03/2016  . Leukocytosis 12/03/2016  . Alcohol withdrawal (HCC) 12/02/2016  . Alcohol dependence with other alcohol-induced disorder (HCC) 11/18/2016  . 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 Essential hypertension 11/18/2016  . Elevated liver enzymes 11/14/2016    Past Surgical History:  Procedure Laterality Date  . DENTAL SURGERY         Home Medications    Prior to Admission medications   Medication Sig Start Date End Date Taking? Authorizing Provider  cloNIDine (CATAPRES) 0.1 MG tablet Take 1 tablet (0.1 mg total) by mouth 3 (three) times daily. 12/14/16   Massie Maroon,  FNP  folic acid (FOLVITE) 1 MG tablet Take 1 tablet (1 mg total) by mouth daily. 10/13/16   Massie Maroon, FNP  losartan (COZAAR) 100 MG tablet Take 0.5 tablets (50 mg total) by mouth daily. 12/14/16   Massie Maroon, FNP  methadone (DOLOPHINE) 10 MG tablet Take 4 tablets (40 mg total) by mouth daily. Patient to follow up with PCP to continue original prescription. No script given 12/07/16   Roberto Scales D, MD  Multiple Vitamin (MULTIVITAMIN WITH MINERALS) TABS tablet Take 1 tablet by mouth daily. 12/08/16 02/06/17  Roberto Scales D, MD  nicotine (NICODERM CQ - DOSED IN MG/24 HOURS) 14 mg/24hr patch Place 1 patch (14 mg total) onto the skin daily. 12/08/16   Laverna Peace, MD  OVER THE COUNTER MEDICATION Take 4 capsules by mouth See admin instructions. Juice plus -  Take 2 juice capsules and 2 vegetable capsules every morning    [provider]  thiamine (VITAMIN B-1) 100 MG tablet Take 1 tablet (100 mg total) by mouth daily. 10/13/16   Massie Maroon, FNP  venlafaxine XR (EFFEXOR-XR) 37.5 MG 24 hr capsule Take 1 capsule (37.5 mg total) by mouth daily with breakfast. 12/08/16 02/06/17  Laverna Peace, MD    Family History Family History  Problem Relation Age of Onset  . Hypertension Father   . Stroke Father   . CAD  Father   . Cancer Paternal Grandfather        Lung  . Hyperlipidemia Paternal Grandfather   . Hypertension Paternal Grandfather     Social History Social History   Tobacco Use  . Smoking status: Current Every Day Smoker    Packs/day: 0.50    Types: Cigarettes  . Smokeless tobacco: Never Used  Substance Use Topics  . Alcohol use: Yes    Comment: fifth a day whiskey. nothing in 3 days (10/13/2016)  . Drug use: No     Allergies   Patient has no known allergies.   Review of Systems Review of Systems  Constitutional: Negative for fever.  HENT: Negative for sore throat.   Eyes: Negative for visual disturbance.  Respiratory: Negative for  shortness of breath.   Cardiovascular: Negative for chest pain.  Gastrointestinal: Negative for abdominal pain.  Genitourinary: Negative for difficulty urinating.  Musculoskeletal: Negative for back pain and neck stiffness.  Skin: Negative for rash.  Neurological: Negative for syncope and headaches.     Physical Exam Updated Vital Signs BP (!) 172/117   Pulse 100   Temp 98.3 F (36.8 C) (Oral)   Resp 20   Ht 5\' 11"  (1.803 m)   Wt 88.5 kg (195 lb)   SpO2 95%   BMI 27.20 kg/m   Physical Exam  Constitutional: He is oriented to person, place, and time. He appears well-developed and well-nourished. No distress.  HENT:  Head: Normocephalic and atraumatic.  Eyes: Conjunctivae and EOM are normal.  Neck: Normal range of motion.  Cardiovascular: Regular rhythm, normal heart sounds and intact distal pulses. Tachycardia present. Exam reveals no gallop and no friction rub.  No murmur heard. Pulmonary/Chest: Effort normal and breath sounds normal. No respiratory distress. He has no wheezes. He has no rales.  Abdominal: Soft. He exhibits no distension. There is no tenderness. There is no guarding.  Musculoskeletal: He exhibits no edema.  Neurological: He is alert and oriented to person, place, and time.  Skin: Skin is warm and dry. He is not diaphoretic.  Nursing note and vitals reviewed.    ED Treatments / Results  Labs (all labs ordered are listed, but only abnormal results are displayed) Labs Reviewed  COMPREHENSIVE METABOLIC PANEL - Abnormal; Notable for the following components:      Result Value   Sodium 134 (*)    Chloride 94 (*)    Glucose, Bld 109 (*)    Total Protein 8.8 (*)    AST 55 (*)    All other components within normal limits  CBC - Abnormal; Notable for the following components:   WBC 17.0 (*)    MCHC 36.5 (*)    All other components within normal limits  ETHANOL  RAPID URINE DRUG SCREEN, HOSP PERFORMED  I-STAT TROPONIN, ED    EKG  EKG  Interpretation  Date/Time:  Saturday January 20 2017 10:05:49 EST Ventricular Rate:  114 PR Interval:    QRS Duration: 86 QT Interval:  316 QTC Calculation: 436 R Axis:   76 Text Interpretation:  Sinus tachycardia Consider left ventricular hypertrophy Borderline T abnormalities, diffuse leads Since prior ECG, rate has increased, no other significant abnormalities Confirmed by Alvira MondaySchlossman, Chavon Lucarelli ([id number]) on 01/20/2017 10:11:22 AM       Radiology Dg Chest 2 View  Result Date: 01/20/2017 CLINICAL DATA:  Mid chest pain EXAM: CHEST  2 VIEW COMPARISON:  10/10/2016 FINDINGS: Heart and mediastinal contours are within normal limits. No focal opacities or effusions. No  acute bony abnormality. IMPRESSION: No active cardiopulmonary disease. Electronically Signed   By: Kevin  Dover M.Charlett Nose.   On: 01/20/2017 11:18    Procedures Procedures (including critical care time)  Medications Ordered in ED Medications  losartan (COZAAR) tablet 50 mg (not administered)  sodium chloride 0.9 % bolus 1,000 mL (1,000 mLs Intravenous New Bag/Given 01/20/17 1127)  cloNIDine (CATAPRES) tablet 0.1 mg (0.1 mg Oral Given 01/20/17 1128)  chlordiazePOXIDE (LIBRIUM) capsule 50 mg (50 mg Oral Given 01/20/17 1128)  ondansetron (ZOFRAN) injection 4 mg (4 mg Intravenous Given 01/20/17 1127)  ketorolac (TORADOL) 15 MG/ML injection 15 mg (15 mg Intravenous Given 01/20/17 1128)  hydrOXYzine (ATARAX/VISTARIL) tablet 25 mg (25 mg Oral Given 01/20/17 1128)  sodium chloride 0.9 % bolus 1,000 mL (1,000 mLs Intravenous New Bag/Given 01/20/17 1128)  gi cocktail (Maalox,Lidocaine,Donnatal) (30 mLs Oral Given 01/20/17 1128)     Initial Impression / Assessment and Plan / ED Course  I have reviewed the triage vital signs and the nursing notes.  Pertinent labs & imaging results that were available during my care of the patient were reviewed by me and considered in my medical decision making (see chart for details).     36 year old male presents  with desire for detox from methadone and alcohol.  He also reports he had nausea vomiting and chest pain through the night.  EKG done showed sinus tachycardia without acute findings.  Patient with tachycardia which may be secondary to withdrawal or dehydration from emesis, as well as elevated blood pressure which also may be secondary to withdrawal or patient not taking his blood pressure medications.  Reports anxiety. No sign of tremor while at rest.  CIWA score appropriate for outpatient treatment of etoh withdrawal.  Regarding chest pain, doubt PE, dissection, esophageal rupture by hx, physical.  Low risk HEAR score with with negative troponin and doubt ACS. Possible etoh gastritis/esophagitis.   Given GI cocktail, IV fluids, home dose of clonidine, losartan and dose of librium, toradol, and hydroxazine in the ED.  Vital signs improved.  Findings consistent with likely both methadone and etoh withdrawal. Reports chest pain improved following gi cocktail.   Provided resources for outpatient and inpatient rehab as well as librium taper, hydroxyzine, Zofran, ranitidine.  Final Clinical Impressions(s) / ED Diagnoses   Final diagnoses:  Alcohol withdrawal syndrome with complication (HCC)  Methadone withdrawal (HCC)  Chest pain, unspecified type  Acute alcoholic gastritis without hemorrhage    ED Discharge Orders    None       Alvira MondaySchlossman, Renita Brocks, MD 01/20/17 1925

## 2017-01-20 NOTE — ED Notes (Signed)
Pt tried to urinate but was unsuccessful.

## 2017-01-20 NOTE — ED Notes (Signed)
Patient transported to X-ray 

## 2017-01-20 NOTE — ED Triage Notes (Signed)
Patient BIB by father seeking assistance with detoxing from alcohol and methadone. Patient reports drinking daily of unknown amounts, last drink was at 2am this morning and he drank 2 shots of whiskey and 4-6 beers. Patient reports going to a Methadone clinic however he hasn't had methadone in several days due to drinking. He is A&O x4, ambulatory. Denies pain. Does report intermittent chest discomfort at times and the worst is rated at a 3 when it occurs.

## 2017-01-20 NOTE — ED Notes (Signed)
Bed: WA02 Expected date:  Expected time:  Means of arrival:  Comments: 

## 2017-01-20 NOTE — ED Notes (Signed)
Pt stated that he is giong through methadone and ETOH detox. Pt used methadone this past wenesday, and ETOH 0200 this morning.

## 2017-01-22 MED FILL — ?CLONIDINE HCL 0.1 MG TABL: 0.1 | 10 days supply | Qty: 30 | Fill #1

## 2017-01-22 MED FILL — FOLIC ACID 1 MG TABLET: 1 | 30 days supply | Qty: 30 | Fill #2

## 2017-01-22 MED FILL — LOSARTAN POTASSIUM 50 MG TA: 50 | 30 days supply | Qty: 30 | Fill #0

## 2017-01-22 MED FILL — VENLAFAXINE HCL ER 37.5 MG: 37.5 | 10 days supply | Qty: 10 | Fill #0

## 2017-01-22 MED FILL — METHOCARBAMOL 500 MG TABS: 500 | 3 days supply | Qty: 20 | Fill #0

## 2017-01-22 MED FILL — hydrOXYzine HCL 25 MG TABS: 25 | 3 days supply | Qty: 20 | Fill #0

## 2017-02-14 ENCOUNTER — Encounter (HOSPITAL_COMMUNITY): Payer: Self-pay | Admitting: *Deleted

## 2017-02-14 ENCOUNTER — Emergency Department (HOSPITAL_COMMUNITY): Payer: Self-pay

## 2017-02-14 ENCOUNTER — Other Ambulatory Visit: Payer: Self-pay

## 2017-02-14 ENCOUNTER — Inpatient Hospital Stay (HOSPITAL_COMMUNITY)
Admission: EM | Admit: 2017-02-14 | Discharge: 2017-02-16 | DRG: 439 | Disposition: A | Discharge status: Home / Self Care | Payer: Self-pay | Attending: Internal Medicine | Admitting: Internal Medicine

## 2017-02-14 DIAGNOSIS — K852 Alcohol induced acute pancreatitis without necrosis or infection: Principal | ICD-10-CM | POA: Diagnosis present

## 2017-02-14 DIAGNOSIS — F1110 Opioid abuse, uncomplicated: Secondary | ICD-10-CM | POA: Diagnosis present

## 2017-02-14 DIAGNOSIS — E876 Hypokalemia: Secondary | ICD-10-CM | POA: Diagnosis present

## 2017-02-14 DIAGNOSIS — F10288 Alcohol dependence with other alcohol-induced disorder: Secondary | ICD-10-CM | POA: Diagnosis present

## 2017-02-14 DIAGNOSIS — R748 Abnormal levels of other serum enzymes: Secondary | ICD-10-CM | POA: Diagnosis present

## 2017-02-14 DIAGNOSIS — D72829 Elevated white blood cell count, unspecified: Secondary | ICD-10-CM | POA: Diagnosis present

## 2017-02-14 DIAGNOSIS — Z823 Family history of stroke: Secondary | ICD-10-CM

## 2017-02-14 DIAGNOSIS — R1084 Generalized abdominal pain: Secondary | ICD-10-CM

## 2017-02-14 DIAGNOSIS — Z8249 Family history of ischemic heart disease and other diseases of the circulatory system: Secondary | ICD-10-CM

## 2017-02-14 DIAGNOSIS — F10230 Alcohol dependence with withdrawal, uncomplicated: Secondary | ICD-10-CM | POA: Diagnosis present

## 2017-02-14 DIAGNOSIS — Z23 Encounter for immunization: Secondary | ICD-10-CM

## 2017-02-14 DIAGNOSIS — K7010 Alcoholic hepatitis without ascites: Secondary | ICD-10-CM | POA: Diagnosis present

## 2017-02-14 DIAGNOSIS — F10939 Alcohol use, unspecified with withdrawal, unspecified: Secondary | ICD-10-CM | POA: Diagnosis present

## 2017-02-14 DIAGNOSIS — R109 Unspecified abdominal pain: Secondary | ICD-10-CM | POA: Diagnosis present

## 2017-02-14 DIAGNOSIS — K859 Acute pancreatitis without necrosis or infection, unspecified: Secondary | ICD-10-CM | POA: Diagnosis present

## 2017-02-14 DIAGNOSIS — K8500 Idiopathic acute pancreatitis without necrosis or infection: Secondary | ICD-10-CM

## 2017-02-14 DIAGNOSIS — I10 Essential (primary) hypertension: Secondary | ICD-10-CM | POA: Diagnosis present

## 2017-02-14 DIAGNOSIS — F10239 Alcohol dependence with withdrawal, unspecified: Secondary | ICD-10-CM | POA: Diagnosis present

## 2017-02-14 DIAGNOSIS — Y908 Blood alcohol level of 240 mg/100 ml or more: Secondary | ICD-10-CM | POA: Diagnosis present

## 2017-02-14 HISTORY — DX: Essential (primary) hypertension: I10

## 2017-02-14 LAB — BASIC METABOLIC PANEL
ANION GAP: 14 (ref 5–15)
BUN: 10 mg/dL (ref 6–20)
CHLORIDE: 99 mmol/L — AB (ref 101–111)
CO2: 24 mmol/L (ref 22–32)
Calcium: 8.5 mg/dL — ABNORMAL LOW (ref 8.9–10.3)
Creatinine, Ser: 0.84 mg/dL (ref 0.61–1.24)
GFR calc non Af Amer: >60 mL/min (ref >60)
Glucose, Bld: 192 mg/dL — ABNORMAL HIGH (ref 65–99)
POTASSIUM: 3.7 mmol/L (ref 3.5–5.1)
Sodium: 137 mmol/L (ref 135–145)

## 2017-02-14 LAB — HEPATIC FUNCTION PANEL
ALBUMIN: 3.4 g/dL — AB (ref 3.5–5.0)
ALT: 65 U/L — ABNORMAL HIGH (ref 17–63)
AST: 81 U/L — ABNORMAL HIGH (ref 15–41)
Alkaline Phosphatase: 76 U/L (ref 38–126)
BILIRUBIN INDIRECT: 0.9 mg/dL (ref 0.3–0.9)
BILIRUBIN TOTAL: 1.2 mg/dL (ref 0.3–1.2)
Bilirubin, Direct: 0.3 mg/dL (ref 0.1–0.5)
Total Protein: 7.1 g/dL (ref 6.5–8.1)

## 2017-02-14 LAB — URINALYSIS, ROUTINE W REFLEX MICROSCOPIC
BILIRUBIN URINE: NEGATIVE
GLUCOSE, UA: 50 mg/dL — AB
KETONES UR: 5 mg/dL — AB
LEUKOCYTES UA: NEGATIVE
NITRITE: NEGATIVE
PH: 5 (ref 5.0–8.0)
Protein, ur: 100 mg/dL — AB
SPECIFIC GRAVITY, URINE: 1.021 (ref 1.005–1.030)

## 2017-02-14 LAB — ETHANOL: ALCOHOL ETHYL (B): 305 mg/dL — AB (ref <10)

## 2017-02-14 LAB — CBC
HEMATOCRIT: 50.3 % (ref 39.0–52.0)
HEMOGLOBIN: 18.3 g/dL — AB (ref 13.0–17.0)
MCH: 32.6 pg (ref 26.0–34.0)
MCHC: 36.4 g/dL — ABNORMAL HIGH (ref 30.0–36.0)
MCV: 89.5 fL (ref 78.0–100.0)
Platelets: 214 10*3/uL (ref 150–400)
RBC: 5.62 MIL/uL (ref 4.22–5.81)
RDW: 12.9 % (ref 11.5–15.5)
WBC: 20 10*3/uL — ABNORMAL HIGH (ref 4.0–10.5)

## 2017-02-14 LAB — LIPASE, BLOOD: Lipase: 298 U/L — ABNORMAL HIGH (ref 11–51)

## 2017-02-14 LAB — RAPID URINE DRUG SCREEN, HOSP PERFORMED
Amphetamines: NOT DETECTED
BARBITURATES: NOT DETECTED
BENZODIAZEPINES: POSITIVE — AB
Cocaine: NOT DETECTED
Opiates: NOT DETECTED
TETRAHYDROCANNABINOL: NOT DETECTED

## 2017-02-14 LAB — I-STAT TROPONIN, ED: Troponin i, poc: 0 ng/mL (ref 0.00–0.08)

## 2017-02-14 IMAGING — DX DG CHEST 2V
2 series · 2 of 2 positions shown · non-contrast
Comparison: Chest x-ray of [DATE]

CLINICAL DATA: Episode of burning sensation in the chest 2 days
ago. Found but a hypertensive today.

EXAM:
CHEST  2 VIEW

[chest pa]
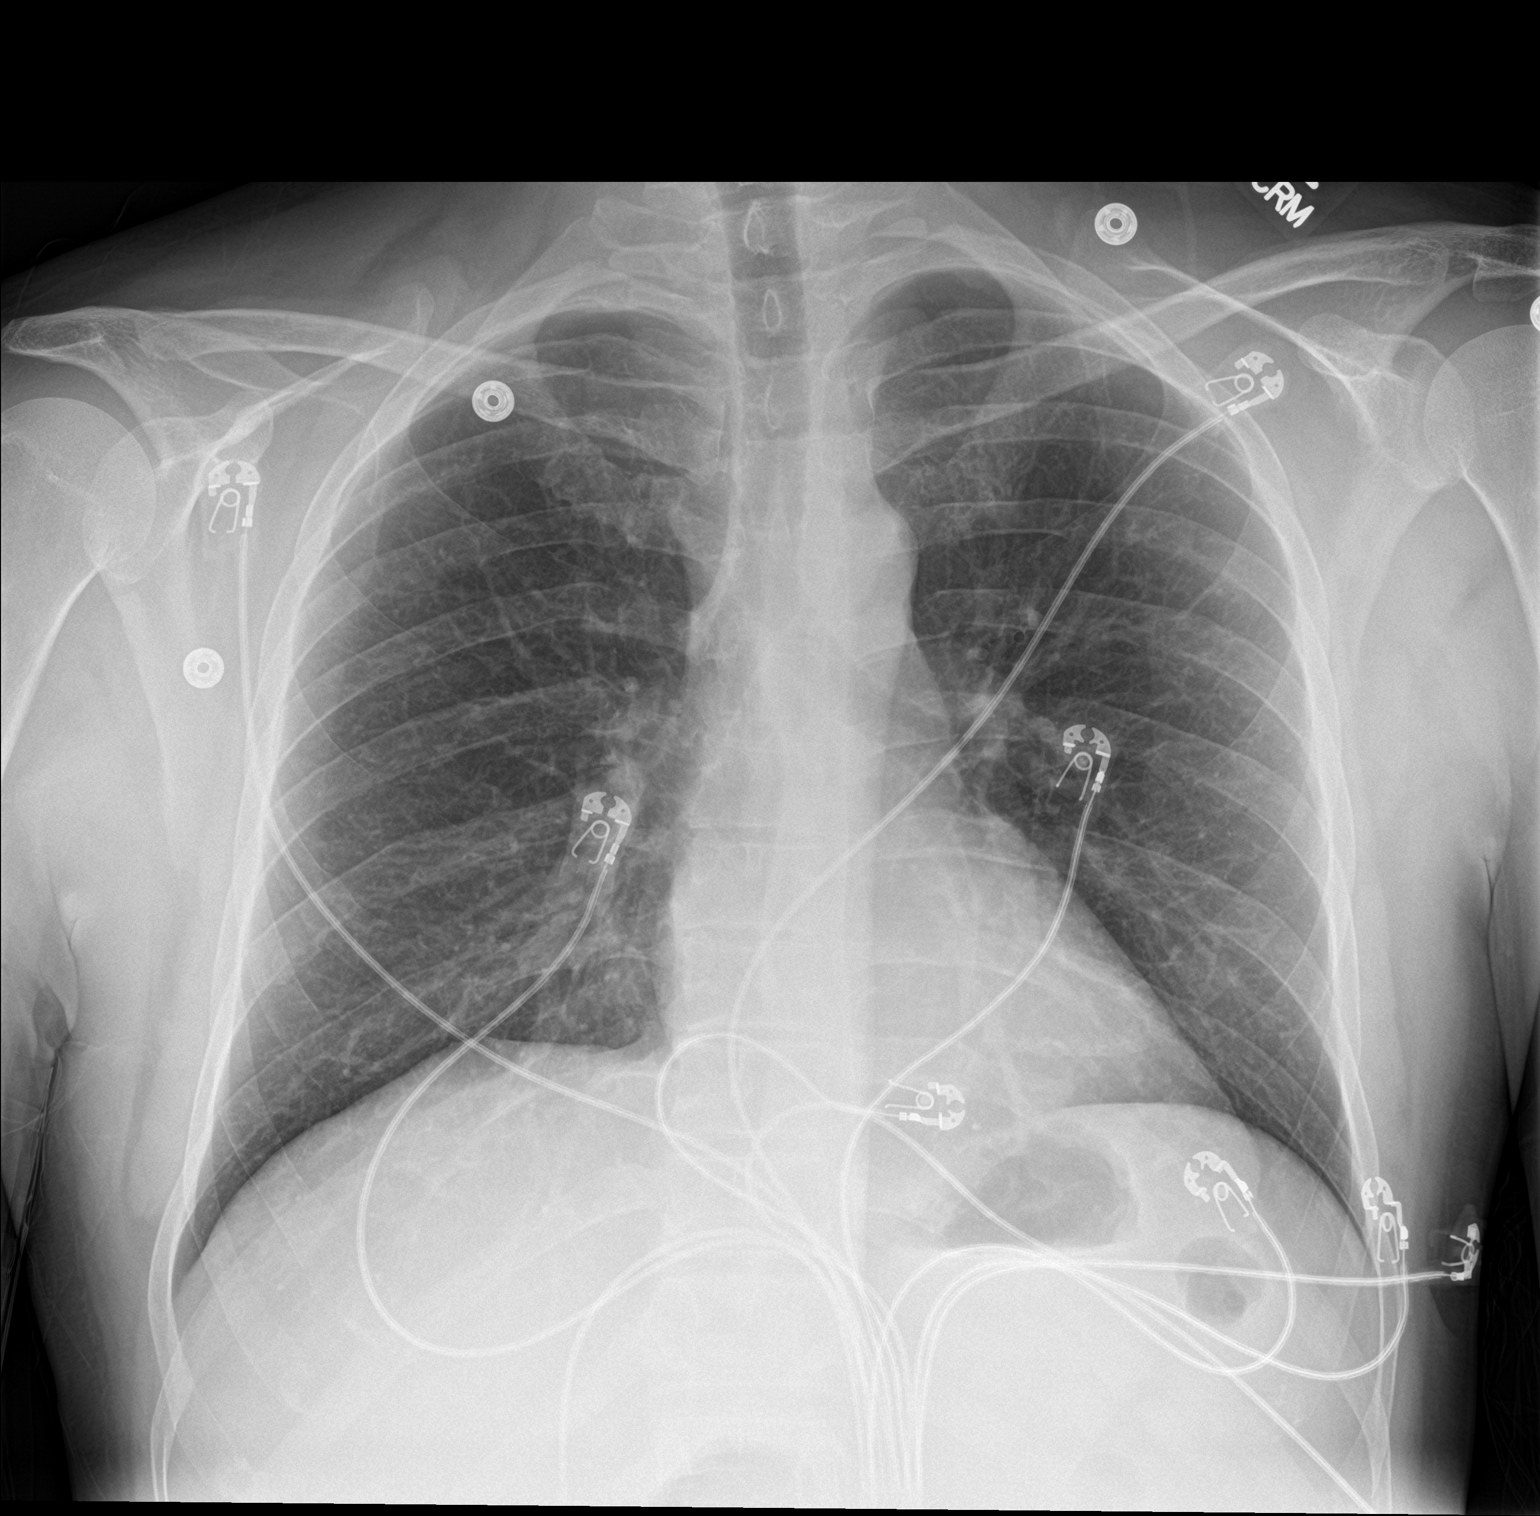

[chest lat]
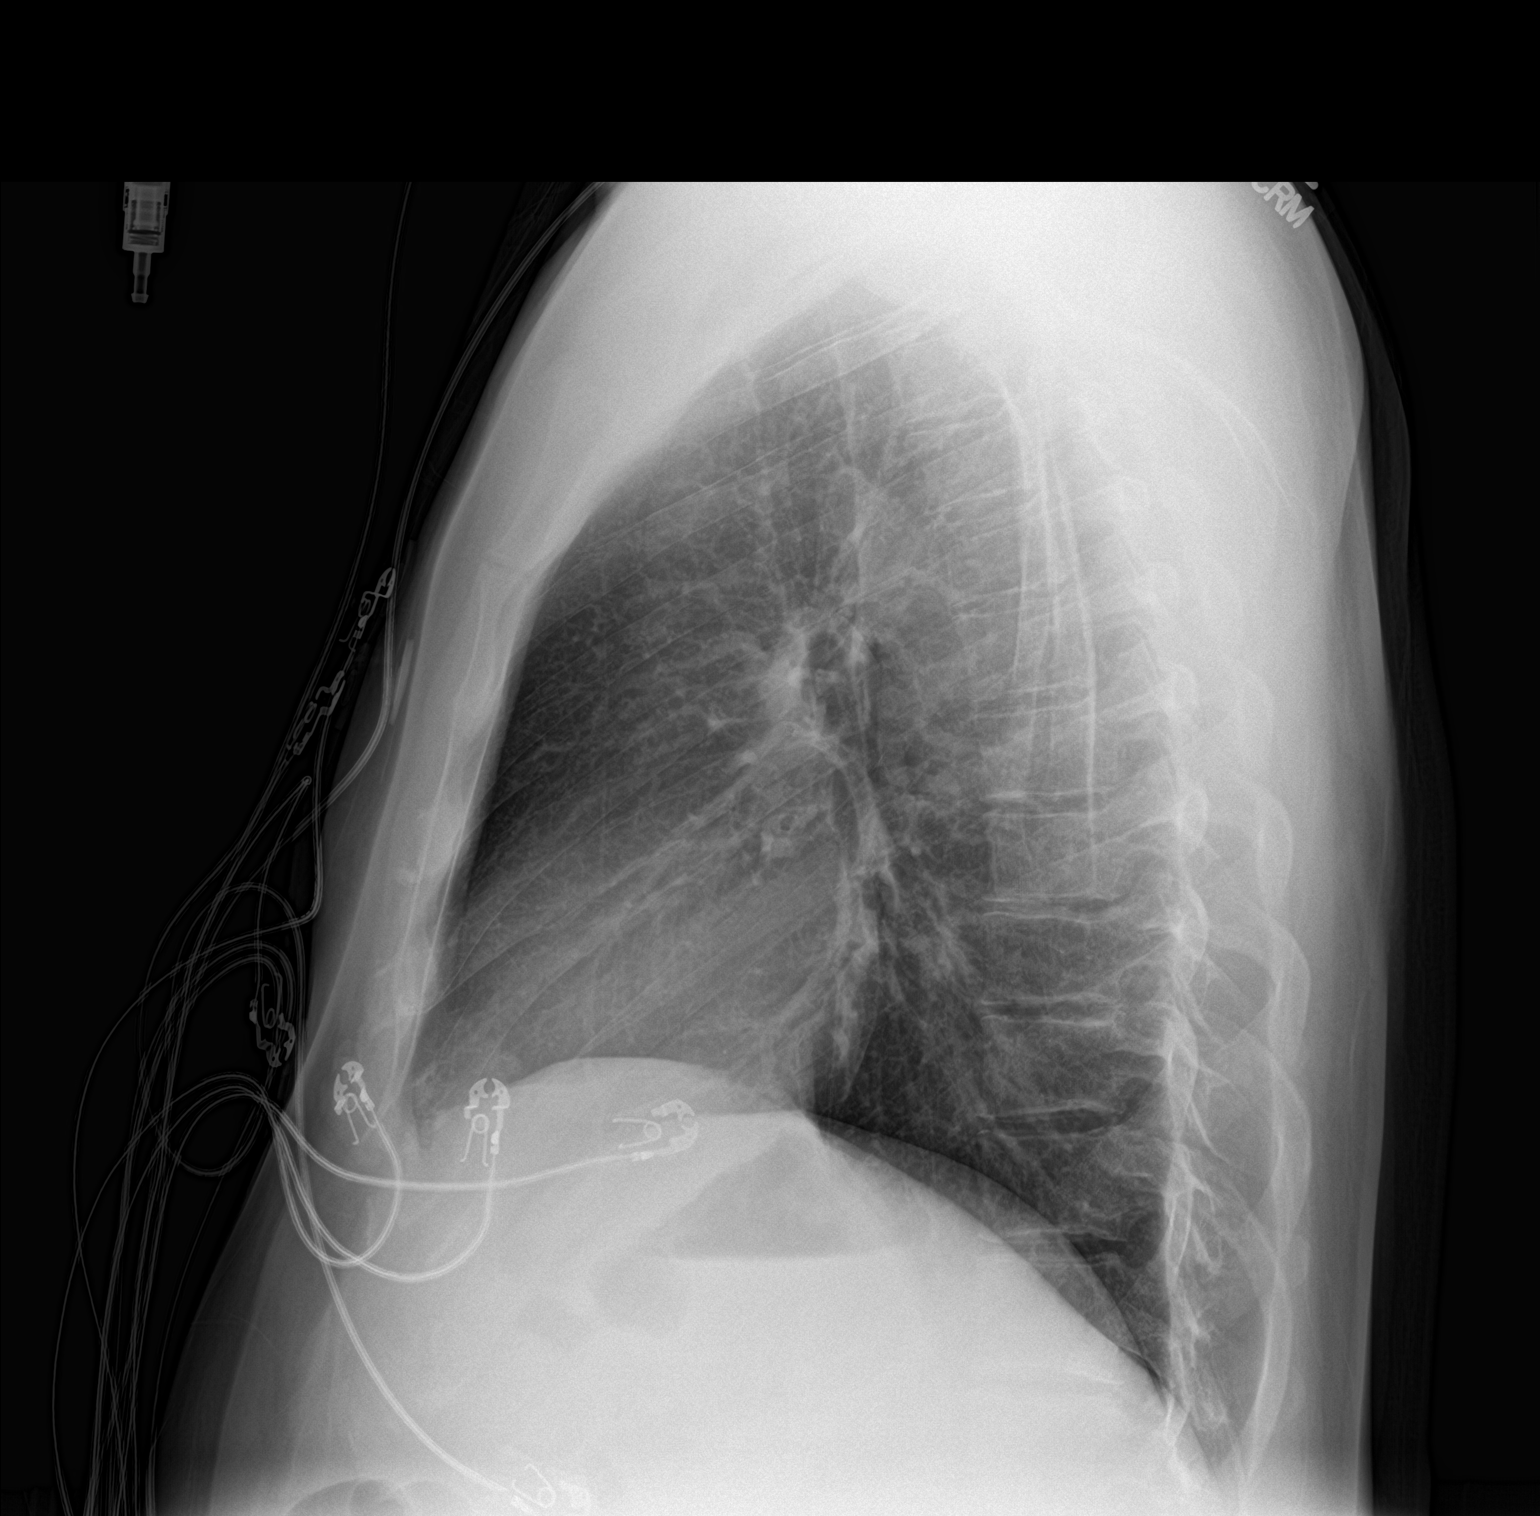

[2 of 2 positions shown; findings below may reference images not displayed]

FINDINGS: The lungs are adequately inflated and clear. The heart and pulmonary
vascularity are normal. The mediastinum is normal in width. There is
no pleural effusion. The trachea is midline. The bony thorax
exhibits no acute abnormality.
IMPRESSION: There is no pneumonia nor other acute cardiopulmonary abnormality.

## 2017-02-14 MED ORDER — CLONIDINE HCL 0.1 MG PO TABS
0.1000 mg | ORAL_TABLET | Freq: Once | ORAL | Status: AC
  Administered 2017-02-14: 0.1 mg via ORAL
  Filled 2017-02-14: qty 1

## 2017-02-14 MED ORDER — HYDROMORPHONE HCL 1 MG/ML IJ SOLN
1.0000 mg | Freq: Once | INTRAMUSCULAR | Status: AC
  Administered 2017-02-14: 1 mg via INTRAVENOUS
  Filled 2017-02-14: qty 1

## 2017-02-14 MED ORDER — MORPHINE SULFATE (PF) 4 MG/ML IV SOLN
4.0000 mg | Freq: Once | INTRAVENOUS | Status: AC
  Administered 2017-02-14: 4 mg via INTRAVENOUS
  Filled 2017-02-14: qty 1

## 2017-02-14 MED ORDER — GI COCKTAIL ~~LOC~~
30.0000 mL | Freq: Once | ORAL | Status: AC
  Administered 2017-02-14: 30 mL via ORAL
  Filled 2017-02-14: qty 30

## 2017-02-14 MED ORDER — SODIUM CHLORIDE 0.9 % IV BOLUS (SEPSIS)
1000.0000 mL | Freq: Once | INTRAVENOUS | Status: AC
  Administered 2017-02-14: 1000 mL via INTRAVENOUS

## 2017-02-14 NOTE — ED Provider Notes (Signed)
[name] Provider Note   CSN: [id number] Arrival date & time: 02/14/17  1204     History   Chief Complaint Chief Complaint  Patient presents with  . Hypertension  . Chest Pain    HPI Nicholes StairsBrandon Appleman is a 36 y.o. male with history significant for hypertension and substance abuse presenting with concern of elevated blood pressure and stating that he is in withdrawal from alcohol.  Patient reports last drink was this morning and consisting of a glass of vodka. Denies any alcohol use yesterday. He has been compliant with antihypertensive medications. Patient did not complain of any chest pain on my assessment, he reported chronic abdominal discomfort that is relieved by etoh. He reports that he has had extreme high blood pressure and has been recording it 4 times daily. He reports being seen 3 times in the last 6 months for alcohol withdrawal. He has not been eating well for the past 4 days.  Patient's main concerns today are hypertension and withdrawal, but patient denies any new symptoms and describes withdrawal as feeling restless. He also explained that he has been on methadone and has not used opioids for the past 6 years. He discontinued methadone 4 weeks ago and has been turning to alcohol. He reports wanting a change and will be moving with his mother when he leaves the hospital. His current environment encourages alcohol use. He is asking for assistance with alcohol abuse rehab.  HPI  Past Medical History:  Diagnosis Date  . Alcoholism (HCC)   . Hypertension   . Substance abuse [facility](HCC)     Patient Active Problem List   Diagnosis Date Noted  . Hypokalemia 12/03/2016  . Leukocytosis 12/03/2016  . Alcohol withdrawal (HCC) 12/02/2016  . Alcohol dependence with other alcohol-induced disorder (HCC) 11/18/2016  . 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 Essential hypertension 11/18/2016  . Elevated liver enzymes 11/14/2016    Past Surgical  History:  Procedure Laterality Date  . DENTAL SURGERY         Home Medications    Prior to Admission medications   Medication Sig Start Date End Date Taking? Authorizing Provider  cloNIDine (CATAPRES) 0.1 MG tablet Take 1 tablet (0.1 mg total) by mouth 3 (three) times daily. 12/14/16  Yes Massie MaroonHollis, Lachina M, FNP  folic acid (FOLVITE) 1 MG tablet Take 1 tablet (1 mg total) by mouth daily. 10/13/16  Yes Massie MaroonHollis, Lachina M, FNP  hydrOXYzine (ATARAX/VISTARIL) 25 MG tablet Take 1-2 tablets (25-50 mg total) by mouth every 6 (six) hours as needed for anxiety. 01/20/17  Yes Alvira MondaySchlossman, Erin, MD  losartan (COZAAR) 50 MG tablet Take 1 tablet (50 mg total) by mouth daily. 01/20/17 02/19/17 Yes Alvira MondaySchlossman, Erin, MD  methocarbamol (ROBAXIN) 500 MG tablet Take 1-2 tablets (500-1,000 mg total) by mouth every 6 (six) hours as needed for muscle spasms. 01/20/17  Yes Alvira MondaySchlossman, Erin, MD  ondansetron (ZOFRAN ODT) 4 MG disintegrating tablet Take 1 tablet (4 mg total) by mouth every 8 (eight) hours as needed for nausea or vomiting. 01/20/17  Yes Alvira MondaySchlossman, Erin, MD  OVER THE COUNTER MEDICATION Take 4 capsules by mouth See admin instructions. Juice plus -  Take 2 juice capsules and 2 vegetable capsules every morning   Yes [provider]  ranitidine (ZANTAC) 150 MG capsule Take 1 capsule (150 mg total) by mouth 2 (two) times daily. Patient taking differently: Take 150 mg by mouth daily as needed for heartburn.  01/20/17  Yes Alvira MondaySchlossman, Erin, MD  thiamine (VITAMIN B-1) 100 MG tablet Take 1 tablet (100 mg total) by mouth daily. 10/13/16  Yes Massie MaroonHollis, Lachina M, FNP  venlafaxine XR (EFFEXOR XR) 37.5 MG 24 hr capsule Take 1 capsule (37.5 mg total) by mouth daily with breakfast. 01/20/17  Yes Alvira MondaySchlossman, Erin, MD  chlordiazePOXIDE (LIBRIUM) 25 MG capsule 50mg  PO TID x 1D, then 25-50mg  PO BID X 1D, then 25-50mg  PO QD X 1D Patient not taking: Reported on 02/14/2017 01/20/17   Alvira MondaySchlossman, Erin, MD  methadone (DOLOPHINE)  10 MG tablet Take 4 tablets (40 mg total) by mouth daily. Patient to follow up with PCP to continue original prescription. No script given Patient not taking: Reported on 02/14/2017 12/07/16   Roberto ScalesNettey, Shayla D, MD  nicotine (NICODERM CQ - DOSED IN MG/24 HOURS) 14 mg/24hr patch Place 1 patch (14 mg total) onto the skin daily. Patient not taking: Reported on 01/20/2017 12/08/16   Roberto ScalesNettey, Shayla D, MD  venlafaxine XR (EFFEXOR-XR) 37.5 MG 24 hr capsule Take 1 capsule (37.5 mg total) by mouth daily with breakfast. 12/08/16 02/06/17  Laverna PeaceNettey, Shayla D, MD    Family History Family History  Problem Relation Age of Onset  . Hypertension Father   . Stroke Father   . CAD Father   . Cancer Paternal Grandfather        Lung  . Hyperlipidemia Paternal Grandfather   . Hypertension Paternal Grandfather     Social History Social History   Tobacco Use  . Smoking status: Current Every Day Smoker    Packs/day: 0.50    Types: Cigarettes  . Smokeless tobacco: Never Used  Substance Use Topics  . Alcohol use: Yes    Comment: fifth a day whiskey. nothing in 3 days (10/13/2016)  . Drug use: No     Allergies   Patient has no known allergies.   Review of Systems Review of Systems  Constitutional: Positive for appetite change and fatigue. Negative for chills and fever.  Eyes: Negative for photophobia, redness and visual disturbance.  Respiratory: Negative for cough, choking, chest tightness, wheezing and stridor.   Cardiovascular: Negative for chest pain and palpitations.  Gastrointestinal: Positive for abdominal pain. Negative for abdominal distention, diarrhea, nausea and vomiting.  Genitourinary: Negative for difficulty urinating, dysuria, flank pain and frequency.  Musculoskeletal: Negative for back pain, gait problem, myalgias, neck pain and neck stiffness.  Skin: Negative for color change, pallor and rash.  Neurological: Negative for dizziness, tremors, seizures, syncope, facial asymmetry,  speech difficulty, weakness, light-headedness, numbness and headaches.  Psychiatric/Behavioral: Negative for agitation and confusion.     Physical Exam Updated Vital Signs BP (!) 163/95   Pulse (!) 110   Temp 98.7 F (37.1 C) (Oral)   Resp (!) 22   Ht 5\' 11"  (1.803 m)   Wt 88.5 kg (195 lb)   SpO2 95%   BMI 27.20 kg/m   Physical Exam  Constitutional: He is oriented to person, place, and time. He appears well-developed and well-nourished.  Non-toxic appearance. He does not appear ill. No distress.  Patient is well-appearing, nontoxic, afebrile, lying comfortably in bed no acute distress. Blood pressure was normal on my assessment while having patient lie still and distracted while taking deep breaths.  HENT:  Head: Atraumatic.  Eyes: EOM are normal. Pupils are equal, round, and reactive to light.  Neck: Normal range of motion.  Cardiovascular: Regular rhythm and intact distal pulses. Tachycardia present.  No murmur heard. Pulmonary/Chest: Effort normal and breath sounds normal. No accessory muscle usage  or stridor. No tachypnea. No respiratory distress. He has no decreased breath sounds. He has no wheezes. He has no rhonchi. He has no rales.  Abdominal: Soft. He exhibits no distension and no mass. There is tenderness. There is no rebound and no guarding.  Discomfort with palpation of the epigastric region on reassessment  Musculoskeletal: Normal range of motion.       Right lower leg: He exhibits no edema.       Left lower leg: He exhibits no edema.  Neurological: He is alert and oriented to person, place, and time. He is not disoriented.  No tremors  Skin: Skin is warm. No rash noted. He is not diaphoretic. No erythema. No pallor.  Psychiatric: He has a normal mood and affect. His behavior is normal. He is not agitated.  Nursing note and vitals reviewed.    ED Treatments / Results  Labs (all labs ordered are listed, but only abnormal results are displayed) Labs Reviewed    BASIC METABOLIC PANEL - Abnormal; Notable for the following components:      Result Value   Chloride 99 (*)    Glucose, Bld 192 (*)    Calcium 8.5 (*)    All other components within normal limits  CBC - Abnormal; Notable for the following components:   WBC 20.0 (*)    Hemoglobin 18.3 (*)    MCHC 36.4 (*)    All other components within normal limits  URINALYSIS, ROUTINE W REFLEX MICROSCOPIC - Abnormal; Notable for the following components:   Color, Urine AMBER (*)    APPearance CLOUDY (*)    Glucose, UA 50 (*)    Hgb urine dipstick MODERATE (*)    Ketones, ur 5 (*)    Protein, ur 100 (*)    Bacteria, UA RARE (*)    Squamous Epithelial / LPF 0-5 (*)    All other components within normal limits  RAPID URINE DRUG SCREEN, HOSP PERFORMED - Abnormal; Notable for the following components:   Benzodiazepines POSITIVE (*)    All other components within normal limits  ETHANOL - Abnormal; Notable for the following components:   Alcohol, Ethyl (B) 305 (*)    All other components within normal limits  LIPASE, BLOOD - Abnormal; Notable for the following components:   Lipase 298 (*)    All other components within normal limits  HEPATIC FUNCTION PANEL - Abnormal; Notable for the following components:   Albumin 3.4 (*)    AST 81 (*)    ALT 65 (*)    All other components within normal limits  I-STAT TROPONIN, ED    EKG  EKG Interpretation  Date/Time:  Wednesday February 14 2017 12:25:21 EST Ventricular Rate:  76 PR Interval:  126 QRS Duration: 102 QT Interval:  370 QTC Calculation: 416 R Axis:   74 Text Interpretation:  Normal sinus rhythm Normal ECG no acute changes  Confirmed by Crista Curb [phone]) on 02/14/2017 3:59:49 PM       Radiology Dg Chest 2 View  Result Date: 02/14/2017 CLINICAL DATA:  Episode of burning sensation in the chest 2 days ago. Found but a hypertensive today. EXAM: CHEST  2 VIEW COMPARISON:  Chest x-ray of January 20, 2017 FINDINGS: The lungs are adequately  inflated and clear. The heart and pulmonary vascularity are normal. The mediastinum is normal in width. There is no pleural effusion. The trachea is midline. The bony thorax exhibits no acute abnormality. IMPRESSION: There is no pneumonia nor other acute cardiopulmonary abnormality.  Electronically Signed   By: David  [place] M.D.   On: 02/14/2017 13:25    Procedures Procedures (including critical care time)  Medications Ordered in ED Medications  sodium chloride 0.9 % bolus 1,000 mL (0 mLs Intravenous Stopped 02/14/17 1740)  cloNIDine (CATAPRES) tablet 0.1 mg (0.1 mg Oral Given 02/14/17 1418)  gi cocktail (Maalox,Lidocaine,Donnatal) (30 mLs Oral Given 02/14/17 1739)  sodium chloride 0.9 % bolus 1,000 mL (1,000 mLs Intravenous New Bag/Given 02/14/17 1954)  morphine 4 MG/ML injection 4 mg (4 mg Intravenous Given 02/14/17 1954)     Initial Impression / Assessment and Plan / ED Course  I have reviewed the triage vital signs and the nursing notes.  Pertinent labs & imaging results that were available during my care of the patient were reviewed by me and considered in my medical decision making (see chart for details).   Presenting with concerns for hypertension and withdrawal symptoms.  Patient's EtOH in the 300s with last drink this morning.  No tremors and patient is well-appearing and calm on my assessment. Persistent tachycardia throughout his stay in ED. Given IV fluids  Patient was given his home dose of clonidine with improvement. Patient's blood pressure was improving.  He was resting comfortably.  Consulted social work to assist and substance abuse program registration.  Arrangements were made to have patient treated inpatient and placement was confirmed with available space in a few days.  On reassessment, patient was questioning the epigastric pain he has been experiencing intermittently. He explained that it would resolve when drinking alcohol, but was causing him discomfort at  this time. Stating that his PCP had ordered an outpatient RUQ [place] to evaluate whether "this was from withdrawal or something else".  Added lipase and GI cocktail. Transferred patient care at end of shift to [place] Russo, PA-C pending lipase, observation, improvement in symptoms and vital signs.  Initially anticipated possible discharge home if vitals stabilizing after fluids and antihypertensive. Admission if persistent abnormal vitals and findings of acute pancreatitis.  Final Clinical Impressions(s) / ED Diagnoses   Final diagnoses:  None    ED Discharge Orders    None       Gregary Cromer 02/14/17 2030    Lavera Guise, MD 02/15/17 [phone]

## 2017-02-14 NOTE — ED Notes (Signed)
This RN has reminded pt we need a urine specimen.

## 2017-02-14 NOTE — Progress Notes (Signed)
CSW spoke with pt and mother at bedside. [facility] [place]. [facility] and spoke with Elnita Maxwellheryl and confirmed that as of today they do not have bed, but may have one in the morning. CSW provided pt and mother with contact information for [facility] and suggested that they call every day at 9am until bed becomes available. At this time there are no further CSW needs. CSW signing off.      Claude MangesKierra S. Christian Borgerding, MSW, LCSW-A Emergency Department Clinical Social Worker [phone]

## 2017-02-14 NOTE — ED Notes (Signed)
Pt aware we need a urine specimen. 

## 2017-02-14 NOTE — ED Notes (Signed)
Pt understands we need a urine specimen

## 2017-02-14 NOTE — H&P (Signed)
[facility] History and Physical  Peter StairsBrandon Lemarr [id number] DOB: [birth year] DOA: 02/14/2017  Referring physician: Dr Verdie MosherLiu PCP: Massie MaroonHollis, Lachina M, FNP   Chief Complaint: ETOH withdrawal, abd pain/ pancreatitis  HPI: Peter [name] is a 36 y.o. male with hx of HTN and etoh/ narcotic abuse, presented to ED with mid abd pain, anorexia, nausea no vomiting.  Burning in chest 2 days ago.  Drinks up to a pint of liquor per day.  Went through detox 2 mos ago and was sober for 3 weeks. He has been taking his BP meds.  Has been seen 3 times in the last 6 mos for etoh withdrawal according to the patient.  Also pt just recently stopped his methadone about 4 wks ago, it was at 40 mg.  Per ED notes pt reported he wanted to change , and will be moving in w/ his mother when he leaves the hospital.  Asked for assistance with ETOH abuse rehab.  IN the ED lipase was up at 298, no hx pancreatitis.  AST/ ALT were also up 2-3x normal.  Asked to see for admission.   Pt gives long term hx of chronic abd pain that he was attributing to etoh withdrawal.  Had one episode emesis today.  Has been having some sweats and shakiness.  No diarrhea.  No fevers or chills, no prod cough, no SOB or CP.    ROS  denies CP  no joint pain   no HA  no blurry vision  no rash  no diarrhea  no nausea/ vomiting  no dysuria  no difficulty voiding  no change in urine color    Past Medical History  Past Medical History:  Diagnosis Date  . Alcoholism (HCC)   . Hypertension   . Substance abuse [facility](HCC)    Past Surgical History  Past Surgical History:  Procedure Laterality Date  . DENTAL SURGERY     Family History  Family History  Problem Relation Age of Onset  . Hypertension Father   . Stroke Father   . CAD Father   . Cancer Paternal Grandfather        Lung  . Hyperlipidemia Paternal Grandfather   . Hypertension Paternal Grandfather    Social History  reports that he has been smoking cigarettes.  He has  been smoking about 0.50 packs per day. he has never used smokeless tobacco. He reports that he drinks alcohol. He reports that he does not use drugs. Allergies No Known Allergies Home medications Prior to Admission medications   Medication Sig Start Date End Date Taking? Authorizing Provider  cloNIDine (CATAPRES) 0.1 MG tablet Take 1 tablet (0.1 mg total) by mouth 3 (three) times daily. 12/14/16  Yes Massie MaroonHollis, Lachina M, FNP  folic acid (FOLVITE) 1 MG tablet Take 1 tablet (1 mg total) by mouth daily. 10/13/16  Yes Massie MaroonHollis, Lachina M, FNP  hydrOXYzine (ATARAX/VISTARIL) 25 MG tablet Take 1-2 tablets (25-50 mg total) by mouth every 6 (six) hours as needed for anxiety. 01/20/17  Yes Alvira MondaySchlossman, Erin, MD  losartan (COZAAR) 50 MG tablet Take 1 tablet (50 mg total) by mouth daily. 01/20/17 02/19/17 Yes Alvira MondaySchlossman, Erin, MD  methocarbamol (ROBAXIN) 500 MG tablet Take 1-2 tablets (500-1,000 mg total) by mouth every 6 (six) hours as needed for muscle spasms. 01/20/17  Yes Alvira MondaySchlossman, Erin, MD  ondansetron (ZOFRAN ODT) 4 MG disintegrating tablet Take 1 tablet (4 mg total) by mouth every 8 (eight) hours as needed for nausea or vomiting. 01/20/17  Yes Schlossman,  Denny PeonErin, MD  OVER THE COUNTER MEDICATION Take 4 capsules by mouth See admin instructions. Juice plus -  Take 2 juice capsules and 2 vegetable capsules every morning   Yes [provider]  ranitidine (ZANTAC) 150 MG capsule Take 1 capsule (150 mg total) by mouth 2 (two) times daily. Patient taking differently: Take 150 mg by mouth daily as needed for heartburn.  01/20/17  Yes Alvira MondaySchlossman, Erin, MD  thiamine (VITAMIN B-1) 100 MG tablet Take 1 tablet (100 mg total) by mouth daily. 10/13/16  Yes Massie MaroonHollis, Lachina M, FNP  venlafaxine XR (EFFEXOR XR) 37.5 MG 24 hr capsule Take 1 capsule (37.5 mg total) by mouth daily with breakfast. 01/20/17  Yes Alvira MondaySchlossman, Erin, MD  chlordiazePOXIDE (LIBRIUM) 25 MG capsule 50mg  PO TID x 1D, then 25-50mg  PO BID X 1D, then 25-50mg  PO  QD X 1D Patient not taking: Reported on 02/14/2017 01/20/17   Alvira MondaySchlossman, Erin, MD  methadone (DOLOPHINE) 10 MG tablet Take 4 tablets (40 mg total) by mouth daily. Patient to follow up with PCP to continue original prescription. No script given Patient not taking: Reported on 02/14/2017 12/07/16   Roberto ScalesNettey, Shayla D, MD  nicotine (NICODERM CQ - DOSED IN MG/24 HOURS) 14 mg/24hr patch Place 1 patch (14 mg total) onto the skin daily. Patient not taking: Reported on 01/20/2017 12/08/16   Roberto ScalesNettey, Shayla D, MD  venlafaxine XR (EFFEXOR-XR) 37.5 MG 24 hr capsule Take 1 capsule (37.5 mg total) by mouth daily with breakfast. 12/08/16 02/06/17  Roberto ScalesNettey, Shayla D, MD   Liver Function Tests Recent Labs  Lab 02/14/17 1734  AST 81*  ALT 65*  ALKPHOS 76  BILITOT 1.2  PROT 7.1  ALBUMIN 3.4*   Recent Labs  Lab 02/14/17 1706  LIPASE 298*   CBC Recent Labs  Lab 02/14/17 1221  WBC 20.0*  HGB 18.3*  HCT 50.3  MCV 89.5  PLT 214   Basic Metabolic Panel Recent Labs  Lab 02/14/17 1221  NA 137  K 3.7  CL 99*  CO2 24  GLUCOSE 192*  BUN 10  CREATININE 0.84  CALCIUM 8.5*     Vitals:   02/14/17 2000 02/14/17 2030 02/14/17 2100 02/14/17 2130  BP: (!) 163/95 (!) 151/79 (!) 164/92 (!) 148/91  Pulse: (!) 110 (!) 117 99 (!) 113  Resp: (!) 22 (!) 21 (!) 24 (!) 24  Temp:      TempSrc:      SpO2: 95% 94% 94% 95%  Weight:      Height:       Exam: Gen: alert, a bit tremulous, no distress, l ying flat No rash, cyanosis or gangrene Sclera anicteric, throat clear  No jvd or bruits Chest clear bilat RRR no MRG Abd diffuse abd tendernes, worst in mid abd, no mass or ascites +bs GU normal male MS no joint effusions or deformity Ext no LE or UE edema / no wounds or ulcers Neuro is alert, Ox 3 , nf, no asterixis    Home meds: -losartan 50 hs/ clonidine 0.1mg  tid -venlafaxine 37.5mg  bid/ methadone - not taking anymore -folvite/ atarax prn/ robaxin prn/ zofran prn/ zantac 150/ thiamine/ librium  prn/ nicotine patch  Na 137 K 3.7 BN 10 Cr 0.84  Alb 3.4  AST 81  ALT 65  Lipase 298   Tbili 1.2 WBC 20k  Hb 18  plt 214 UA - negative    UDS + BZD's  EKG (independ reviewed) > NSR 76 bpm, no acute changes CXR (independ reviewed) >  no active disease   Assessment: 1. Abdominal pain/ mid abd , epigastric/ Christell Faith - probable acute pancreatitis, vs PUD, vs other.  ^WBC and hemoconcentration noted.  Admit, NPO, pain meds IV, CT abdomen ordered for tomorrow am.  Aggressive IVF resuscitation per UTD rec's.     2. ^LFT"s - do to alcohol abuse 3. ^WBC - prob due to pancreatitis. Doesn't appear septic, CXR clear, afebrile, UA benign.  Observe. NO abx for now.  4. HTN - poorly controlled.  CIWA protocol, ordered prn IV BP meds and ^clon to 0.2 tid, sub norvasc for losartan for now.    5. ETOH abuse/ withdrawal - CIWA protocol.  Pt asking for help with etoh rehab.   6. VOlume - no vol excess or ascites    Plan - as above.        Delano Metz D [facility] Pager [phone]   If 7PM-7AM, please contact night-coverage [email or web address] Password [facility] 02/14/2017, 10:56 PM

## 2017-02-14 NOTE — ED Provider Notes (Signed)
Care [facility] shift change [facility], pending observation and reevaluation.  Per PA Clovis RileyMitchell, patient presenting with concern for high blood pressure, and checking blood pressure multiple times per day.  Patient also with alcohol abuse, and complaining of chronic epigastric abdominal pain for which he outpatient ultrasound scheduled by his PCP.   On my evaluation, patient denies any known history of pancreatitis.  States he stopped taking methadone about 3 weeks ago as well.  He drinks alcohol daily, with last drink prior to arrival. States he has chronically had epigastric abdominal discomfort, however is unaware of any worsening pain since he has been constantly drinking.  Reports an episode of emesis today, however states he is not currently nauseous.  States abdominal pain is 7/10 severity, not relieved with GI cocktail administered previously.  Physical Exam  BP (!) 163/95   Pulse (!) 110   Temp 98.7 F (37.1 C) (Oral)   Resp (!) 22   Ht 5\' 11"  (1.803 m)   Wt 88.5 kg (195 lb)   SpO2 95%   BMI 27.20 kg/m   Physical Exam  Constitutional: He is oriented to person, place, and time. He appears well-developed and well-nourished.  Non-toxic appearance. He does not appear ill. No distress.  HENT:  Head: Normocephalic and atraumatic.  Eyes: Conjunctivae are normal.  Cardiovascular: Regular rhythm, normal heart sounds and intact distal pulses.  tachycardic  Pulmonary/Chest: Effort normal and breath sounds normal.  Abdominal: Soft. Bowel sounds are normal. He exhibits no distension. There is generalized tenderness. There is no guarding.  Neurological: He is alert and oriented to person, place, and time.  Skin: Skin is warm.  Psychiatric: He has a normal mood and affect. His behavior is normal.  Nursing note and vitals reviewed.   ED Course/Procedures   Clinical Course as of Feb 14 2300  Wed Feb 14, 2017  1636 Care handoff: SW set up outpt substance abuse, BP elev w  anxiety, plan to monitor and anticipated discharge.  [JR]  1939 Lipase 298. WBC 20.0. Likely acute pancreatitis. Will administer pain mediation, fluids, and re-evaluate for disposition, possible admission.  [JR]  2256 Dr. Arlean HoppingSchertz w [facility] accepting admission.  [JR]    Clinical Course User Index [JR] Zoejane Gaulin, [place] N, PA-C    Procedures  MDM  Care assumed at shift change, pending reevaluation with anticipated discharge, however labs resulting indicating acute pancreatitis.  Lipase 298, white count 20.0.  No known history of pancreatitis.  Will treat with IV fluids and pain medicine and re-evaluate symptom control.  EtOH level 300, therefore patient not withdrawing in the ED.  Pain without significant improvement after morphine and dilaudid. Pt admitted to [facility] for further management.       Axelle Szwed, [place] N, PA-C 02/14/17 2304    Lavera GuiseLiu, Dana Duo, MD 02/15/17 [phone]

## 2017-02-14 NOTE — ED Notes (Signed)
Pt requesting Ativan, states he is unable to sit still.  However, pt is cooperative and calm.   Pt appears to be in no distress.   PA aware.

## 2017-02-14 NOTE — ED Triage Notes (Addendum)
Pt reports his BP is elevated today.  Takes his BP med daily as prescribed.  He states he is possibly having withdrawal.  His last drink was today at 1000.  He endorses abd pain-hasn't eaten in the last 3 days.  He also endorses burning sensation in his chest x 2 days ago.  Denies h/a or dizziness.  Pt reports he drinks a pint of liquor daily and have been drinking for 4-5 years.  Went through detox 2 months ago and was sober for 3 weeks.  He denies any nausea/tremors/pins and needles sensation.

## 2017-02-15 ENCOUNTER — Other Ambulatory Visit: Payer: Self-pay

## 2017-02-15 DIAGNOSIS — K859 Acute pancreatitis without necrosis or infection, unspecified: Secondary | ICD-10-CM

## 2017-02-15 DIAGNOSIS — D72829 Elevated white blood cell count, unspecified: Secondary | ICD-10-CM

## 2017-02-15 DIAGNOSIS — E876 Hypokalemia: Secondary | ICD-10-CM

## 2017-02-15 LAB — CBC
HEMATOCRIT: 41.4 % (ref 39.0–52.0)
HEMOGLOBIN: 15 g/dL (ref 13.0–17.0)
MCH: 32.5 pg (ref 26.0–34.0)
MCHC: 36.2 g/dL — ABNORMAL HIGH (ref 30.0–36.0)
MCV: 89.8 fL (ref 78.0–100.0)
Platelets: 145 10*3/uL — ABNORMAL LOW (ref 150–400)
RBC: 4.61 MIL/uL (ref 4.22–5.81)
RDW: 12.8 % (ref 11.5–15.5)
WBC: 16.4 10*3/uL — ABNORMAL HIGH (ref 4.0–10.5)

## 2017-02-15 LAB — BASIC METABOLIC PANEL
Anion gap: 12 (ref 5–15)
BUN: 7 mg/dL (ref 6–20)
CHLORIDE: 99 mmol/L — AB (ref 101–111)
CO2: 22 mmol/L (ref 22–32)
Calcium: 8 mg/dL — ABNORMAL LOW (ref 8.9–10.3)
Creatinine, Ser: 0.66 mg/dL (ref 0.61–1.24)
GFR calc non Af Amer: >60 mL/min (ref >60)
Glucose, Bld: 138 mg/dL — ABNORMAL HIGH (ref 65–99)
POTASSIUM: 3.1 mmol/L — AB (ref 3.5–5.1)
SODIUM: 133 mmol/L — AB (ref 135–145)

## 2017-02-15 LAB — MAGNESIUM: MAGNESIUM: 1.1 mg/dL — AB (ref 1.7–2.4)

## 2017-02-15 MED ORDER — GI COCKTAIL ~~LOC~~: 30.0000 mL | Freq: Three times a day (TID) | ORAL | Status: DC | PRN

## 2017-02-15 MED ORDER — LACTATED RINGERS IV SOLN
INTRAVENOUS | Status: AC
  Administered 2017-02-15: 01:00:00 via INTRAVENOUS

## 2017-02-15 MED ORDER — METOPROLOL TARTRATE 5 MG/5ML IV SOLN
5.0000 mg | Freq: Once | INTRAVENOUS | Status: AC
  Administered 2017-02-15: 5 mg via INTRAVENOUS
  Filled 2017-02-15: qty 5

## 2017-02-15 MED ORDER — LACTATED RINGERS IV SOLN: INTRAVENOUS | Status: DC

## 2017-02-15 MED ORDER — HYDROMORPHONE HCL 1 MG/ML IJ SOLN
1.0000 mg | INTRAMUSCULAR | Status: DC | PRN
  Administered 2017-02-15: 1 mg via INTRAVENOUS
  Administered 2017-02-15: 2 mg via INTRAVENOUS
  Administered 2017-02-15 – 2017-02-16 (×6): 1 mg via INTRAVENOUS
  Filled 2017-02-15: qty 1
  Filled 2017-02-15: qty 2
  Filled 2017-02-15 (×2): qty 1
  Filled 2017-02-15: qty 2
  Filled 2017-02-15 (×3): qty 1

## 2017-02-15 MED ORDER — THIAMINE HCL 100 MG/ML IJ SOLN
INTRAVENOUS | Status: AC
  Administered 2017-02-15: 02:00:00 via INTRAVENOUS
  Filled 2017-02-15: qty 1000

## 2017-02-15 MED ORDER — VITAMIN B-1 100 MG PO TABS
100.0000 mg | ORAL_TABLET | Freq: Every day | ORAL | Status: DC
  Administered 2017-02-15 – 2017-02-16 (×2): 100 mg via ORAL
  Filled 2017-02-15 (×2): qty 1

## 2017-02-15 MED ORDER — LORAZEPAM 2 MG/ML IJ SOLN: 0.0000 mg | Freq: Two times a day (BID) | INTRAMUSCULAR | Status: DC

## 2017-02-15 MED ORDER — ONDANSETRON HCL 4 MG PO TABS: 4.0000 mg | ORAL_TABLET | Freq: Four times a day (QID) | ORAL | Status: DC | PRN

## 2017-02-15 MED ORDER — POLYETHYLENE GLYCOL 3350 17 G PO PACK: 17.0000 g | PACK | Freq: Every day | ORAL | Status: DC | PRN

## 2017-02-15 MED ORDER — LOSARTAN POTASSIUM 50 MG PO TABS
50.0000 mg | ORAL_TABLET | Freq: Every day | ORAL | Status: DC
Start: 2017-02-15 — End: 2017-02-16
  Administered 2017-02-15 – 2017-02-16 (×2): 50 mg via ORAL
  Filled 2017-02-15 (×2): qty 1

## 2017-02-15 MED ORDER — HYDRALAZINE HCL 20 MG/ML IJ SOLN
10.0000 mg | Freq: Four times a day (QID) | INTRAMUSCULAR | Status: DC | PRN
  Administered 2017-02-15 (×3): 10 mg via INTRAVENOUS
  Filled 2017-02-15 (×3): qty 1

## 2017-02-15 MED ORDER — PANTOPRAZOLE SODIUM 40 MG PO TBEC
40.0000 mg | DELAYED_RELEASE_TABLET | Freq: Every day | ORAL | Status: DC
  Administered 2017-02-15 – 2017-02-16 (×2): 40 mg via ORAL
  Filled 2017-02-15 (×2): qty 1

## 2017-02-15 MED ORDER — THIAMINE HCL 100 MG/ML IJ SOLN: 100.0000 mg | Freq: Every day | INTRAMUSCULAR | Status: DC

## 2017-02-15 MED ORDER — LORAZEPAM 2 MG/ML IJ SOLN
1.0000 mg | Freq: Four times a day (QID) | INTRAMUSCULAR | Status: DC | PRN
Start: 2017-02-15 — End: 2017-02-16

## 2017-02-15 MED ORDER — CLONIDINE HCL 0.2 MG PO TABS
0.2000 mg | ORAL_TABLET | Freq: Three times a day (TID) | ORAL | Status: DC
  Administered 2017-02-15 – 2017-02-16 (×5): 0.2 mg via ORAL
  Filled 2017-02-15 (×5): qty 1

## 2017-02-15 MED ORDER — FOLIC ACID 1 MG PO TABS
1.0000 mg | ORAL_TABLET | Freq: Every day | ORAL | Status: DC
  Administered 2017-02-15 – 2017-02-16 (×2): 1 mg via ORAL
  Filled 2017-02-15 (×2): qty 1

## 2017-02-15 MED ORDER — VENLAFAXINE HCL ER 37.5 MG PO CP24
37.5000 mg | ORAL_CAPSULE | Freq: Every day | ORAL | Status: DC
  Administered 2017-02-15 – 2017-02-16 (×2): 37.5 mg via ORAL
  Filled 2017-02-15 (×3): qty 1

## 2017-02-15 MED ORDER — INFLUENZA VAC SPLIT QUAD 0.5 ML IM SUSY
0.5000 mL | PREFILLED_SYRINGE | INTRAMUSCULAR | Status: AC
  Administered 2017-02-16: 0.5 mL via INTRAMUSCULAR
  Filled 2017-02-15: qty 0.5

## 2017-02-15 MED ORDER — ENOXAPARIN SODIUM 40 MG/0.4ML ~~LOC~~ SOLN
40.0000 mg | Freq: Every day | SUBCUTANEOUS | Status: DC
  Administered 2017-02-15 – 2017-02-16 (×2): 40 mg via SUBCUTANEOUS
  Filled 2017-02-15 (×2): qty 0.4

## 2017-02-15 MED ORDER — MAGNESIUM SULFATE 4 GM/100ML IV SOLN
4.0000 g | Freq: Once | INTRAVENOUS | Status: AC
  Administered 2017-02-15: 4 g via INTRAVENOUS
  Filled 2017-02-15: qty 100

## 2017-02-15 MED ORDER — BISACODYL 10 MG RE SUPP: 10.0000 mg | Freq: Every day | RECTAL | Status: DC | PRN

## 2017-02-15 MED ORDER — ACETAMINOPHEN 325 MG PO TABS: 650.0000 mg | ORAL_TABLET | Freq: Four times a day (QID) | ORAL | Status: DC | PRN

## 2017-02-15 MED ORDER — AMLODIPINE BESYLATE 5 MG PO TABS
5.0000 mg | ORAL_TABLET | Freq: Two times a day (BID) | ORAL | Status: DC
  Administered 2017-02-15 – 2017-02-16 (×4): 5 mg via ORAL
  Filled 2017-02-15 (×4): qty 1

## 2017-02-15 MED ORDER — ONDANSETRON HCL 4 MG/2ML IJ SOLN: 4.0000 mg | Freq: Four times a day (QID) | INTRAMUSCULAR | Status: DC | PRN

## 2017-02-15 MED ORDER — PNEUMOCOCCAL VAC POLYVALENT 25 MCG/0.5ML IJ INJ
0.5000 mL | INJECTION | INTRAMUSCULAR | Status: AC
  Administered 2017-02-16: 0.5 mL via INTRAMUSCULAR
  Filled 2017-02-15: qty 0.5

## 2017-02-15 MED ORDER — SODIUM CHLORIDE 0.9 % IV BOLUS (SEPSIS)
500.0000 mL | Freq: Once | INTRAVENOUS | Status: AC
  Administered 2017-02-15: 500 mL via INTRAVENOUS

## 2017-02-15 MED ORDER — POTASSIUM CHLORIDE CRYS ER 20 MEQ PO TBCR
40.0000 meq | EXTENDED_RELEASE_TABLET | ORAL | Status: AC
  Administered 2017-02-15 (×2): 40 meq via ORAL
  Filled 2017-02-15 (×2): qty 2

## 2017-02-15 MED ORDER — ACETAMINOPHEN 650 MG RE SUPP: 650.0000 mg | Freq: Four times a day (QID) | RECTAL | Status: DC | PRN

## 2017-02-15 MED ORDER — LORAZEPAM 2 MG/ML IJ SOLN: 0.0000 mg | Freq: Four times a day (QID) | INTRAMUSCULAR | Status: DC

## 2017-02-15 MED ORDER — LORAZEPAM 1 MG PO TABS
1.0000 mg | ORAL_TABLET | Freq: Four times a day (QID) | ORAL | Status: DC | PRN
  Administered 2017-02-15 – 2017-02-16 (×6): 1 mg via ORAL
  Filled 2017-02-15 (×6): qty 1

## 2017-02-15 MED ORDER — LACTATED RINGERS IV SOLN
INTRAVENOUS | Status: AC
  Administered 2017-02-15 (×2): via INTRAVENOUS

## 2017-02-15 NOTE — Progress Notes (Addendum)
PROGRESS NOTE    Peter [name]  XBJ:[id number] DOB: 10/25/1980 DOA: 02/14/2017 PCP: Massie MaroonHollis, Lachina M, FNP    Brief Narrative:  Peter [name] is a 36 y.o. male with hx of HTN and etoh/ narcotic abuse, presented to ED with mid abd pain, anorexia, nausea no vomiting.  Burning in chest 2 days ago.  Drinks up to a pint of liquor per day.  Went through detox 2 mos ago and was sober for 3 weeks. He has been taking his BP meds.  Has been seen 3 times in the last 6 mos for etoh withdrawal according to the patient.  Also pt just recently stopped his methadone about 4 wks ago, it was at 40 mg.  Per ED notes pt reported he wanted to change , and will be moving in w/ his mother when he leaves the hospital.  Asked for assistance with ETOH abuse rehab.  IN the ED lipase was up at 298, no hx pancreatitis.  AST/ ALT were also up 2-3x normal.  Asked to see for admission.   Pt gives long term hx of chronic abd pain that he was attributing to etoh withdrawal.  Had one episode emesis today.  Has been having some sweats and shakiness.  No diarrhea.  No fevers or chills, no prod cough, no SOB or CP.       Assessment & Plan:   Principal Problem:   Acute pancreatitis Active Problems:   Elevated liver enzymes   Alcohol dependence with other alcohol-induced disorder (HCC)   Essential hypertension   Alcohol withdrawal (HCC)   Hypokalemia   Leukocytosis   Abdominal pain   Alcoholic hepatitis without ascites   Hypomagnesemia  #1 acute pancreatitis Likely alcohol induced.  Patient noted to have elevated transaminitis.  Patient with complaints of epigastric abdominal pain which seemed to be intermittent with some pain control on current pain regimen.  CT abdomen and pelvis pending.  Check a fasting lipid panel.  Continue bowel rest, aggressive IV fluid hydration, pain management, supportive care.  Follow.  2.  Alcohol withdrawal/alcohol dependence Patient interested in quitting alcohol use.  Patient  noted to have some tremors on examination.  Continue the Ativan CIWA protocol.  IV fluids.  Supportive care.  Continue thiamine, folic acid, multivitamin.  3.  Hypokalemia/hypomagnesemia Replete.  4.  Hypertension Continue Norvasc and clonidine.  Add Cozaar.  5.  Leukocytosis Likely secondary to problem #1.  Improving.   DVT prophylaxis: Lovenox Code Status: Full Family Communication: Updated patient.  No family at bedside. Disposition Plan: Remain in stepdown unit.   Consultants:   None  Procedures:   Chest x-ray 02/14/2017  CT abdomen and pelvis pending  Antimicrobials:   None   Subjective: Sitting up in bed.  States epigastric abdominal pain coming back.  Waiting for pain medications.  No nausea or emesis.  No shortness of breath.  No chest pain.  Feeling better.  Objective: Vitals:   02/15/17 0554 02/15/17 0642 02/15/17 0823 02/15/17 1106  BP: (!) 170/122 (!) 184/130 (!) 175/121 (!) 164/119  Pulse:  (!) 128 (!) 119 (!) 114  Resp: (!) 31 (!) 24 (!) 23   Temp:  98.6 F (37 C)    TempSrc:  Oral    SpO2: 96% 96%    Weight:  88.7 kg (195 lb 8 oz)    Height:  5\' 11"  (1.803 m)      Intake/Output Summary (Last 24 hours) at 02/15/2017 1135 Last data filed at 02/15/2017 1106 Gross per  24 hour  Intake 3530 ml  Output 1000 ml  Net 2530 ml   Filed Weights   02/14/17 1217 02/14/17 1309 02/15/17 0642  Weight: 88.5 kg (195 lb) 88.5 kg (195 lb) 88.7 kg (195 lb 8 oz)    Examination:  General exam: Some tremors noted. Respiratory system: Clear to auscultation. Respiratory effort normal. Cardiovascular system: Tachycardia. No JVD, murmurs, rubs, gallops or clicks. No pedal edema. Gastrointestinal system: Abdomen is nondistended, soft and tender to palpation in the epigastrium.  No organomegaly or masses felt. Normal bowel sounds heard. Central nervous system: Alert and oriented. No focal neurological deficits. Extremities: Symmetric 5 x 5 power. Skin: No rashes,  lesions or ulcers Psychiatry: Judgement and insight appear normal. Mood & affect appropriate.     Data Reviewed: I have personally reviewed following labs and imaging studies  CBC: Recent Labs  Lab 02/14/17 1221 02/15/17 0502  WBC 20.0* 16.4*  HGB 18.3* 15.0  HCT 50.3 41.4  MCV 89.5 89.8  PLT 214 145*   Basic Metabolic Panel: Recent Labs  Lab 02/14/17 1221 02/15/17 0502  NA 137 133*  K 3.7 3.1*  CL 99* 99*  CO2 24 22  GLUCOSE 192* 138*  BUN 10 7  CREATININE 0.84 0.66  CALCIUM 8.5* 8.0*  MG  --  1.1*   GFR: Estimated Creatinine Clearance: 136 mL/min (by C-G formula based on SCr of 0.66 mg/dL). Liver Function Tests: Recent Labs  Lab 02/14/17 1734  AST 81*  ALT 65*  ALKPHOS 76  BILITOT 1.2  PROT 7.1  ALBUMIN 3.4*   Recent Labs  Lab 02/14/17 1706  LIPASE 298*   No results for input(s): AMMONIA in the last 168 hours. Coagulation Profile: No results for input(s): INR, PROTIME in the last 168 hours. Cardiac Enzymes: No results for input(s): CKTOTAL, CKMB, CKMBINDEX, TROPONINI in the last 168 hours. BNP (last 3 results) No results for input(s): PROBNP in the last 8760 hours. HbA1C: No results for input(s): HGBA1C in the last 72 hours. CBG: No results for input(s): GLUCAP in the last 168 hours. Lipid Profile: No results for input(s): CHOL, HDL, LDLCALC, TRIG, CHOLHDL, LDLDIRECT in the last 72 hours. Thyroid Function Tests: No results for input(s): TSH, T4TOTAL, FREET4, T3FREE, THYROIDAB in the last 72 hours. Anemia Panel: No results for input(s): VITAMINB12, FOLATE, FERRITIN, TIBC, IRON, RETICCTPCT in the last 72 hours. Sepsis Labs: No results for input(s): PROCALCITON, LATICACIDVEN in the last 168 hours.  No results found for this or any previous visit (from the past 240 hour(s)).       Radiology Studies: Dg Chest 2 View  Result Date: 02/14/2017 CLINICAL DATA:  Episode of burning sensation in the chest 2 days ago. Found but a hypertensive  today. EXAM: CHEST  2 VIEW COMPARISON:  Chest x-ray of January 20, 2017 FINDINGS: The lungs are adequately inflated and clear. The heart and pulmonary vascularity are normal. The mediastinum is normal in width. There is no pleural effusion. The trachea is midline. The bony thorax exhibits no acute abnormality. IMPRESSION: There is no pneumonia nor other acute cardiopulmonary abnormality. Electronically Signed   By: David  [place] M.D.   On: 02/14/2017 13:25        Scheduled Meds: . amLODipine  5 mg Oral BID  . cloNIDine  0.2 mg Oral TID  . enoxaparin (LOVENOX) injection  40 mg Subcutaneous Daily  . folic acid  1 mg Oral Daily  . LORazepam  0-4 mg Intravenous Q6H   Followed by  . [  START ON 02/17/2017] LORazepam  0-4 mg Intravenous Q12H  . losartan  50 mg Oral Daily  . pantoprazole  40 mg Oral Q0600  . thiamine  100 mg Oral Daily   Or  . thiamine  100 mg Intravenous Daily  . venlafaxine XR  37.5 mg Oral Q breakfast   Continuous Infusions: . lactated ringers 250 mL/hr at 02/15/17 1100  . [START ON 02/16/2017] lactated ringers    . magnesium sulfate 1 - 4 g bolus IVPB 4 g (02/15/17 1112)     LOS: 0 days    Time spent: 35 mins    Ramiro Harvestaniel Maryela Tapper, MD [facility] Pager [phone]  If 7PM-7AM, please contact night-coverage [email or web address] Password [email or web address] 02/15/2017, 11:35 AM

## 2017-02-16 ENCOUNTER — Encounter (HOSPITAL_COMMUNITY): Payer: Self-pay

## 2017-02-16 ENCOUNTER — Inpatient Hospital Stay (HOSPITAL_COMMUNITY): Payer: Self-pay

## 2017-02-16 LAB — COMPREHENSIVE METABOLIC PANEL
ALBUMIN: 3 g/dL — AB (ref 3.5–5.0)
ALT: 39 U/L (ref 17–63)
AST: 30 U/L (ref 15–41)
Alkaline Phosphatase: 66 U/L (ref 38–126)
Anion gap: 9 (ref 5–15)
BUN: 9 mg/dL (ref 6–20)
CHLORIDE: 100 mmol/L — AB (ref 101–111)
CO2: 24 mmol/L (ref 22–32)
Calcium: 8.5 mg/dL — ABNORMAL LOW (ref 8.9–10.3)
Creatinine, Ser: 0.74 mg/dL (ref 0.61–1.24)
GFR calc Af Amer: >60 mL/min (ref >60)
GFR calc non Af Amer: >60 mL/min (ref >60)
GLUCOSE: 95 mg/dL (ref 65–99)
POTASSIUM: 3.8 mmol/L (ref 3.5–5.1)
SODIUM: 133 mmol/L — AB (ref 135–145)
Total Bilirubin: 1.9 mg/dL — ABNORMAL HIGH (ref 0.3–1.2)
Total Protein: 6.6 g/dL (ref 6.5–8.1)

## 2017-02-16 LAB — CBC
HEMATOCRIT: 40.9 % (ref 39.0–52.0)
Hemoglobin: 14.2 g/dL (ref 13.0–17.0)
MCH: 32.3 pg (ref 26.0–34.0)
MCHC: 34.7 g/dL (ref 30.0–36.0)
MCV: 93 fL (ref 78.0–100.0)
Platelets: 131 10*3/uL — ABNORMAL LOW (ref 150–400)
RBC: 4.4 MIL/uL (ref 4.22–5.81)
RDW: 13.4 % (ref 11.5–15.5)
WBC: 18.7 10*3/uL — AB (ref 4.0–10.5)

## 2017-02-16 LAB — MAGNESIUM: MAGNESIUM: 2 mg/dL (ref 1.7–2.4)

## 2017-02-16 LAB — LIPASE, BLOOD: Lipase: 53 U/L — ABNORMAL HIGH (ref 11–51)

## 2017-02-16 IMAGING — CT CT ABD-PELV W/ CM
2 of 5 series · 17 of 46 positions shown, 19 images · IV contrast (APPLIED)
Comparison: None.

CLINICAL DATA: Upper abdomen pain couple months history alcohol
abuse 100mL isovue 300^100mL [HP] IOPAMIDOL ([HP])
INJECTION 61%

EXAM:
CT ABDOMEN AND PELVIS WITH CONTRAST
TECHNIQUE: Multidetector CT imaging of the abdomen and pelvis was performed
using the standard protocol following bolus administration of
intravenous contrast.
CONTRAST:  100mL [HP] IOPAMIDOL ([HP]) INJECTION 61%

[Series 3: abd/ pelvis 5.0 i30f 2 · axial · 0.81mm/px · z∈[+590,+1065]mm · 14 of 109 slices shown, 16 images]
[im 7/109  soft-tissue]
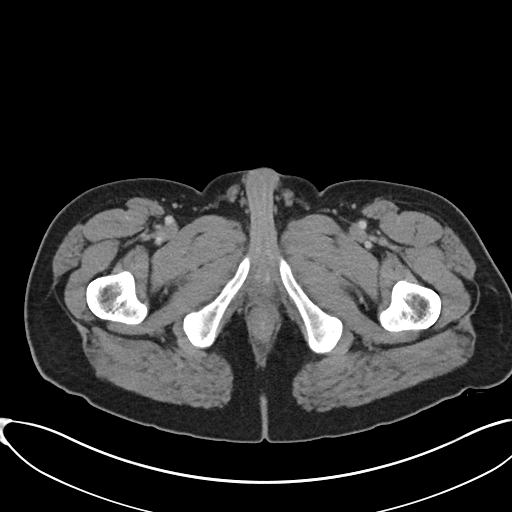
[im 7/109  bone]
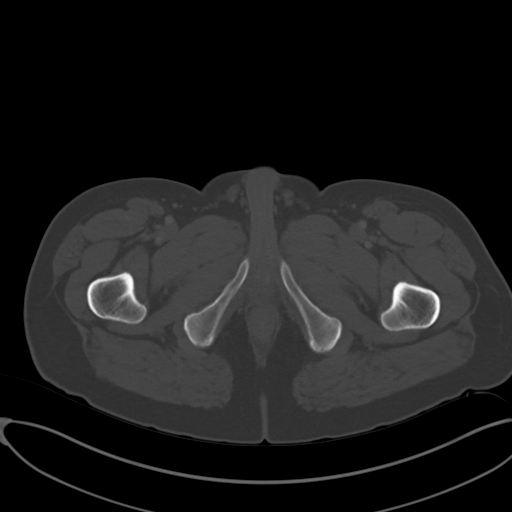
[im 13/109  soft-tissue]
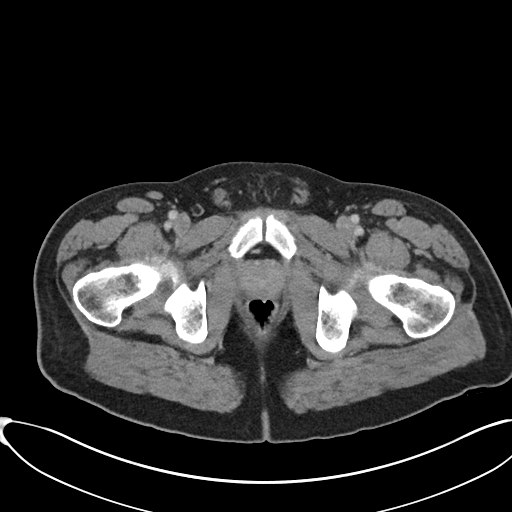
[im 20/109  soft-tissue]
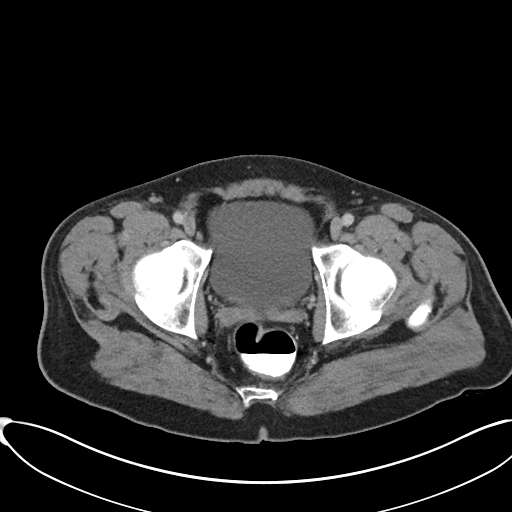
[im 32/109  soft-tissue]
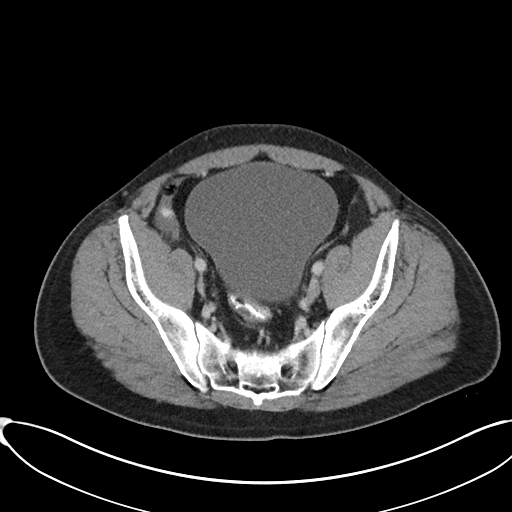
[im 39/109  soft-tissue]
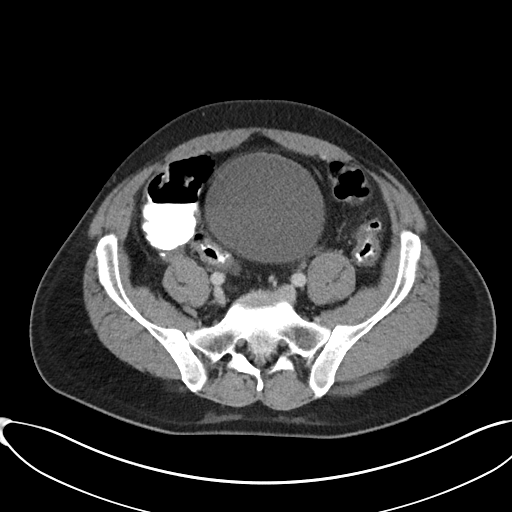
[im 45/109  soft-tissue]
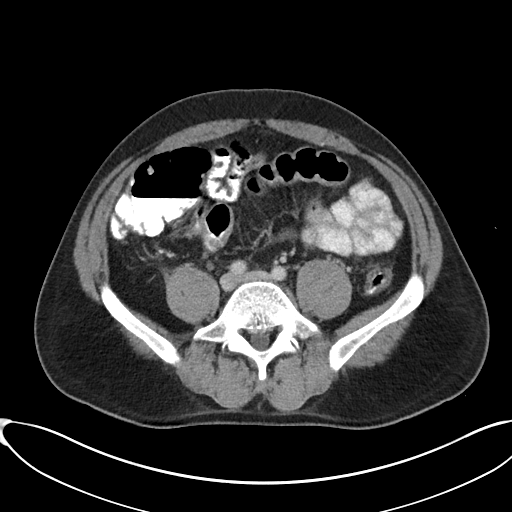
[im 51/109  soft-tissue]
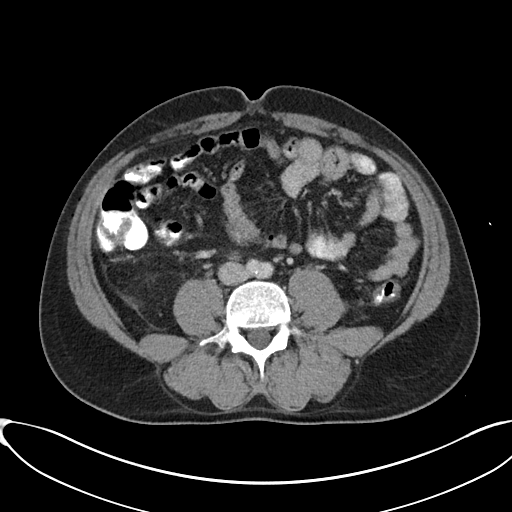
[im 58/109  soft-tissue]
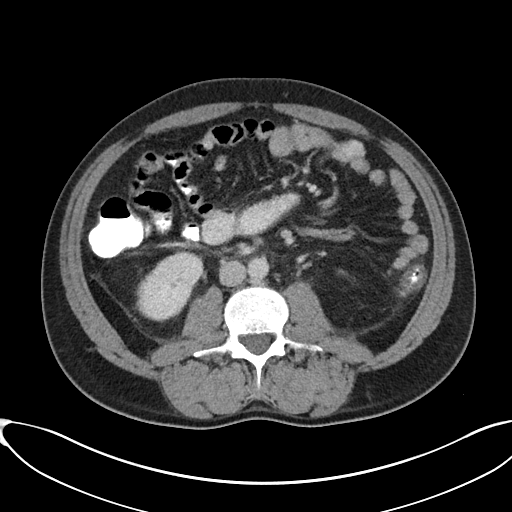
[im 64/109  soft-tissue]
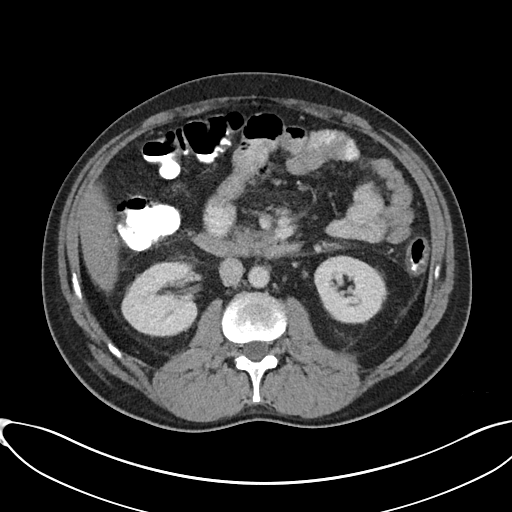
[im 64/109  bone]
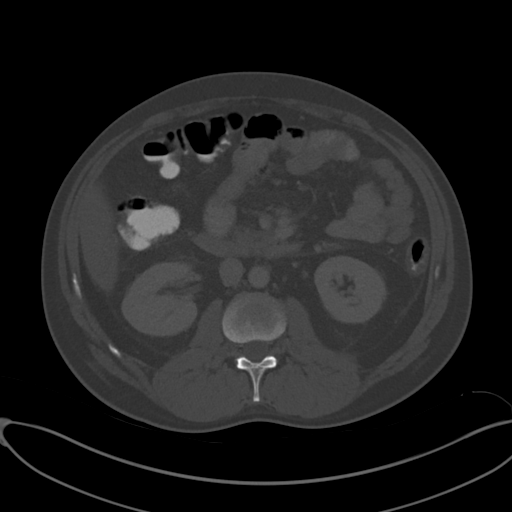
[im 70/109  soft-tissue]
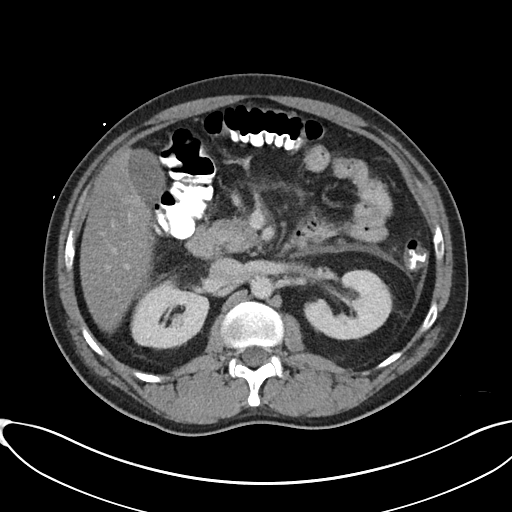
[im 83/109  soft-tissue]
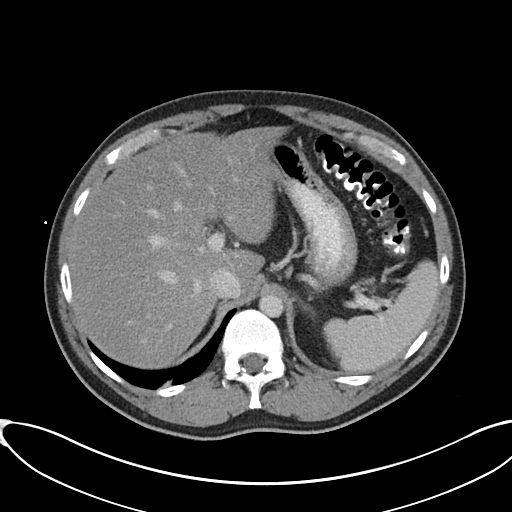
[im 89/109  soft-tissue]
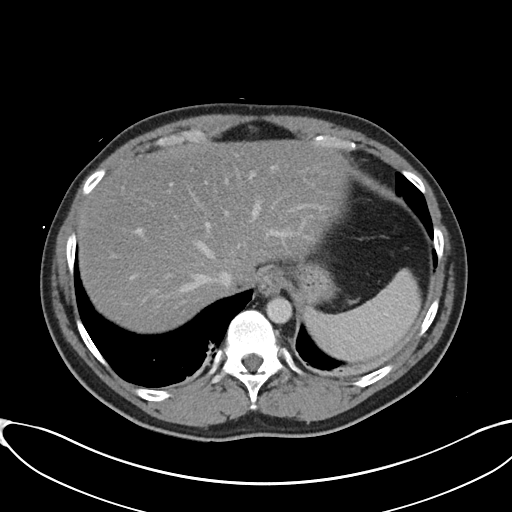
[im 96/109  soft-tissue]
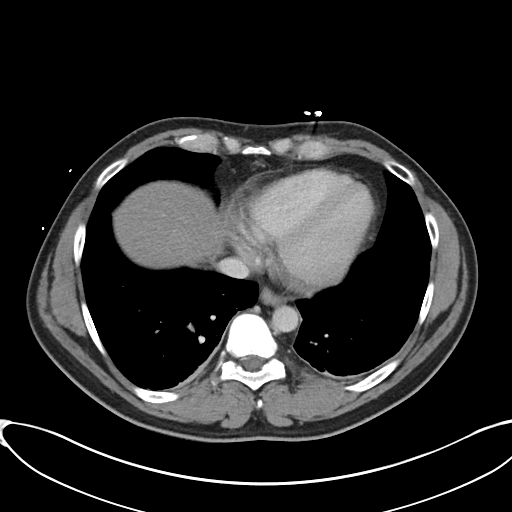
[im 102/109  soft-tissue]
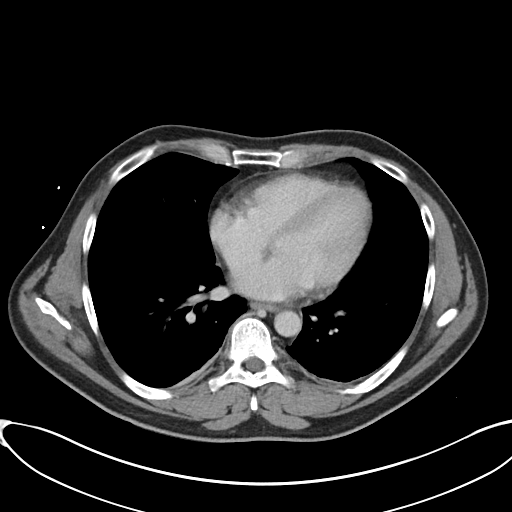

[Series 6: coronal soft tissue · coronal · 1.02mm/px · 3 of 113 slices shown]
[im 38/113  soft-tissue]
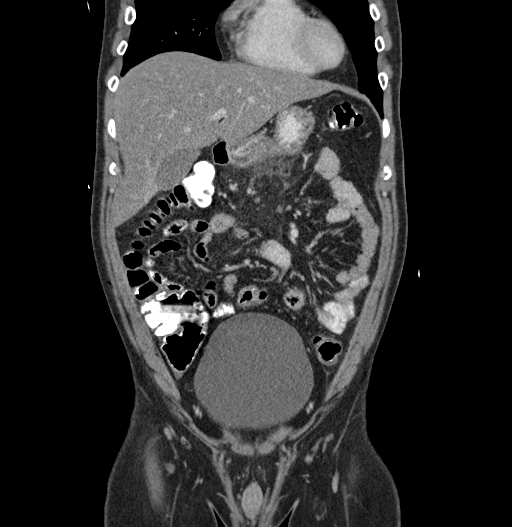
[im 50/113  soft-tissue]
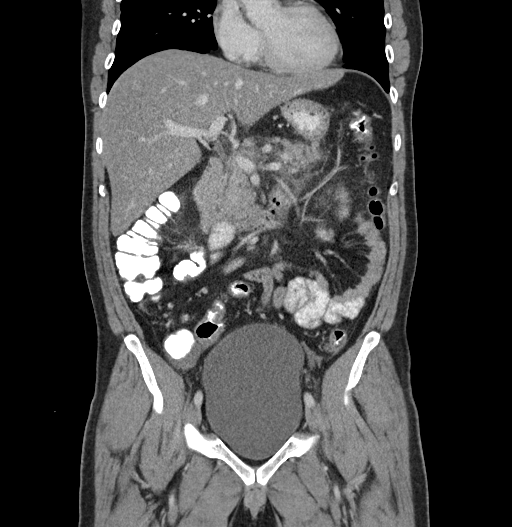
[im 63/113  soft-tissue]
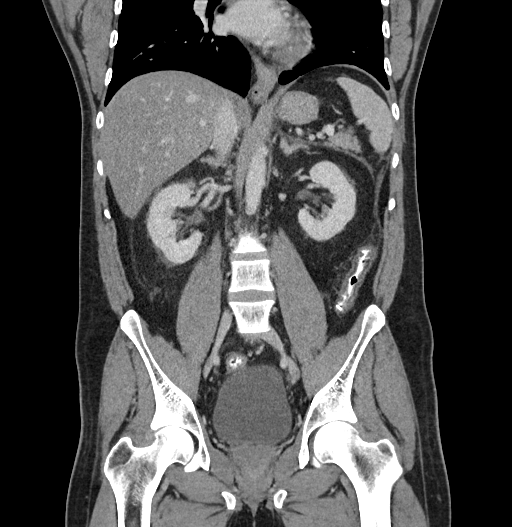

[17 of 46 positions shown; findings below may reference images not displayed]

FINDINGS: Lower chest: Lung bases are clear.

Hepatobiliary: Diffuse low-attenuation within liver suggests hepatic
steatosis. No biliary duct dilatation. Gallbladder normal. No common
bile duct dilatation

Pancreas: There is edema involving the body and tail the pancreas.
Small amount a peri pancreatic fluid ventral to the pancreas as well
as along the LEFT anterior pararenal fascial plane (image 39, series
3 for example). No organized fluid collection. The pancreatic
parenchyma enhances. No evidence of ductal interruption.

No evidence of vascular complication associated with pancreatitis.
Splenic vein is patent.

Spleen: Normal spleen with patent splenic vein

Adrenals/urinary tract: Adrenal glands and kidneys are normal. The
ureters and bladder normal.

Stomach/Bowel: Stomach, small-bowel cecum normal. Appendix is
normal. The colon and rectosigmoid colon are normal.

Vascular/Lymphatic: Abdominal aorta is normal caliber. There is no
retroperitoneal or periportal lymphadenopathy. No pelvic
lymphadenopathy.

Reproductive: Prostate normal

Other: No free fluid.

Musculoskeletal: No aggressive osseous lesion.
IMPRESSION: 1. Acute pancreatitis.
2. No organized fluid collections. No evidence of pancreatic
necrosis or vascular complication.
3. No gallstones by CT imaging.  No Common bile duct dilatation.
4. Suspected hepatic steatosis.

## 2017-02-16 MED ORDER — IOPAMIDOL (ISOVUE-300) INJECTION 61%
INTRAVENOUS | Status: AC
  Administered 2017-02-16: 07:00:00
  Filled 2017-02-16: qty 30

## 2017-02-16 MED ORDER — CLONIDINE HCL 0.2 MG PO TABS: 0.2000 mg | ORAL_TABLET | Freq: Three times a day (TID) | ORAL | 0 refills | Status: DC

## 2017-02-16 MED ORDER — AMLODIPINE BESYLATE 5 MG PO TABS
5.0000 mg | ORAL_TABLET | Freq: Every day | ORAL | 0 refills | Status: DC
Start: 2017-02-16 — End: 2017-05-21

## 2017-02-16 MED ORDER — IOPAMIDOL (ISOVUE-300) INJECTION 61%
INTRAVENOUS | Status: AC
  Filled 2017-02-16: qty 100

## 2017-02-16 MED ORDER — IOPAMIDOL (ISOVUE-300) INJECTION 61%
INTRAVENOUS | Status: AC
  Administered 2017-02-16: 100 mL
  Filled 2017-02-16: qty 100

## 2017-02-16 NOTE — Care Management Note (Signed)
Case Management Note Donn PieriniKristi Idabell Picking RN, BSN Unit 4E-Case Manager [phone]  Patient Details  Name: Peter [name] MRN: [id number] Date of Birth: 09-Feb-1981  Subjective/Objective:    Pt admitted with acute pancreatitis and ETOH (CIWA protocol)                 Action/Plan: PTA pt lived at home- plan to go to moms house at discharge- [facility]-  Hx of methadone for chronic pain- CSW to see pt for resources on etoh/narcotic abuse.   Expected Discharge Date:                  Expected Discharge Plan:  Home/Self Care  In-House Referral:  Clinical Social Work  Discharge planning Services  CM Consult  Post Acute Care Choice:    Choice offered to:     DME Arranged:    DME Agency:     HH Arranged:    HH Agency:     Status of Service:  In process, will continue to follow  If discussed at Long Length of Stay Meetings, dates discussed:    Discharge Disposition:   Additional Comments:  Darrold SpanWebster, Elisa Kutner Hall, RN 02/16/2017, 10:54 AM

## 2017-02-16 NOTE — Discharge Summary (Signed)
Physician Discharge Summary  Peter [name] [id number] [id number] DOB: [birth year] DOA: 02/14/2017  PCP: Massie MaroonHollis, Lachina M, FNP  Admit date: 02/14/2017 Discharge date: 02/16/2017  Admitted From: Home Disposition:  Home  Recommendations for Outpatient Follow-up:  1. Follow up with PCP in 2-3 weeks 2. Please monitor blood pressure and titrate blood pressure medications accordingly   Discharge Condition:Improved CODE STATUS:Full Diet recommendation: Soft, advance as tolerated   Brief/Interim Summary: 36 y.o.malewith hx of HTN and etoh/ narcotic abuse, presented to ED with mid abd pain, anorexia, nausea no vomiting. Burning in chest 2 days ago. Drinks up to a pint of liquor per day. Went through detox 2 mos ago and was sober for 3 weeks. He has been taking his BP meds. Has been seen 3 times in the last 6 mos for etoh withdrawal according to the patient. Also pt just recently stopped his methadone about 4 wks ago, it was at 40 mg. Per ED notes pt reported he wanted to change , and will be moving in w/ his mother when he leaves the hospital. Asked for assistance with ETOH abuse rehab. IN the ED lipase was up at 298, no hx pancreatitis. AST/ ALT were also up 2-3x normal. Asked to see for admission  #1 acute pancreatitis Likely alcohol induced.  Patient noted to have elevated transaminitis.  Patient with complaints of epigastric abdominal pain which seemed to be intermittent with some pain control on current pain regimen.  CT abdomen and pelvis consistent with pancreatitis without evidence of stones.  Lipase trended down and patient tolerated a regular diet without difficulty  2.  Alcohol withdrawal/alcohol dependence Patient interested in quitting alcohol use.  Patient noted to have some tremors on examination initially.  Continued the Ativan CIWA protocol. CIWA score on day of discharge noted to be zero.   3.  Hypokalemia/hypomagnesemia Replaced.  4.  Hypertension Continued Norvasc  and clonidine.  Added home Cozaar.  5.  Leukocytosis Likely secondary to problem #1.  Improving.   Discharge Diagnoses:  Principal Problem:   Acute pancreatitis Active Problems:   Elevated liver enzymes   Alcohol dependence with other alcohol-induced disorder (HCC)   Essential hypertension   Alcohol withdrawal (HCC)   Hypokalemia   Leukocytosis   Abdominal pain   Alcoholic hepatitis without ascites   Hypomagnesemia    Discharge Instructions   Allergies as of 02/16/2017   No Known Allergies     Medication List    TAKE these medications   amLODipine 5 MG tablet Commonly known as:  NORVASC Take 1 tablet (5 mg total) by mouth daily.   cloNIDine 0.2 MG tablet Commonly known as:  CATAPRES Take 1 tablet (0.2 mg total) by mouth 3 (three) times daily. What changed:    medication strength  how much to take   folic acid 1 MG tablet Commonly known as:  FOLVITE Take 1 tablet (1 mg total) by mouth daily.   hydrOXYzine 25 MG tablet Commonly known as:  ATARAX/VISTARIL Take 1-2 tablets (25-50 mg total) by mouth every 6 (six) hours as needed for anxiety.   losartan 50 MG tablet Commonly known as:  COZAAR Take 1 tablet (50 mg total) by mouth daily.   methocarbamol 500 MG tablet Commonly known as:  ROBAXIN Take 1-2 tablets (500-1,000 mg total) by mouth every 6 (six) hours as needed for muscle spasms.   ondansetron 4 MG disintegrating tablet Commonly known as:  ZOFRAN ODT Take 1 tablet (4 mg total) by mouth every 8 (eight) hours as needed  for nausea or vomiting.   OVER THE COUNTER MEDICATION Take 4 capsules by mouth See admin instructions. Juice plus -  Take 2 juice capsules and 2 vegetable capsules every morning   ranitidine 150 MG capsule Commonly known as:  ZANTAC Take 1 capsule (150 mg total) by mouth 2 (two) times daily. What changed:    when to take this  reasons to take this   thiamine 100 MG tablet Commonly known as:  VITAMIN B-1 Take 1 tablet (100  mg total) by mouth daily.   venlafaxine XR 37.5 MG 24 hr capsule Commonly known as:  EFFEXOR-XR Take 1 capsule (37.5 mg total) by mouth daily with breakfast.      Follow-up Information    Massie MaroonHollis, Lachina M, FNP. Schedule an appointment as soon as possible for a visit in 2 week(s).   Specialty:  Family Medicine Contact information: [address] [place] [address] [phone]          No Known Allergies  Procedures/Studies: Dg Chest 2 View  Result Date: 02/14/2017 CLINICAL DATA:  Episode of burning sensation in the chest 2 days ago. Found but a hypertensive today. EXAM: CHEST  2 VIEW COMPARISON:  Chest x-ray of January 20, 2017 FINDINGS: The lungs are adequately inflated and clear. The heart and pulmonary vascularity are normal. The mediastinum is normal in width. There is no pleural effusion. The trachea is midline. The bony thorax exhibits no acute abnormality. IMPRESSION: There is no pneumonia nor other acute cardiopulmonary abnormality. Electronically Signed   By: David  SwazilandJordan M.D.   On: 02/14/2017 13:25   Dg Chest 2 View  Result Date: 01/20/2017 CLINICAL DATA:  Mid chest pain EXAM: CHEST  2 VIEW COMPARISON:  10/10/2016 FINDINGS: Heart and mediastinal contours are within normal limits. No focal opacities or effusions. No acute bony abnormality. IMPRESSION: No active cardiopulmonary disease. Electronically Signed   By: Charlett NoseKevin  Dover M.D.   On: 01/20/2017 11:18   Ct Abdomen Pelvis W Contrast  Result Date: 02/16/2017 CLINICAL DATA:  Upper abdomen pain couple months history alcohol abuse 100mL isovue 300^11100mL ISOVUE-300 IOPAMIDOL (ISOVUE-300) INJECTION 61% EXAM: CT ABDOMEN AND PELVIS WITH CONTRAST TECHNIQUE: Multidetector CT imaging of the abdomen and pelvis was performed using the standard protocol following bolus administration of intravenous contrast. CONTRAST:  100mL ISOVUE-300 IOPAMIDOL (ISOVUE-300) INJECTION 61% COMPARISON:  None. FINDINGS: Lower chest: Lung  bases are clear. Hepatobiliary: Diffuse low-attenuation within liver suggests hepatic steatosis. No biliary duct dilatation. Gallbladder normal. No common bile duct dilatation Pancreas: There is edema involving the body and tail the pancreas. Small amount a peri pancreatic fluid ventral to the pancreas as well as along the LEFT anterior pararenal fascial plane (image 39, series 3 for example). No organized fluid collection. The pancreatic parenchyma enhances. No evidence of ductal interruption. No evidence of vascular complication associated with pancreatitis. Splenic vein is patent. Spleen: Normal spleen with patent splenic vein Adrenals/urinary tract: Adrenal glands and kidneys are normal. The ureters and bladder normal. Stomach/Bowel: Stomach, small-bowel cecum normal. Appendix is normal. The colon and rectosigmoid colon are normal. Vascular/Lymphatic: Abdominal aorta is normal caliber. There is no retroperitoneal or periportal lymphadenopathy. No pelvic lymphadenopathy. Reproductive: Prostate normal Other: No free fluid. Musculoskeletal: No aggressive osseous lesion. IMPRESSION: 1. Acute pancreatitis. 2. No organized fluid collections. No evidence of pancreatic necrosis or vascular complication. 3. No gallstones by CT imaging.  No Common bile duct dilatation. 4. Suspected hepatic steatosis. Electronically Signed   By: Loura HaltStewart  Edmunds M.D.  On: 02/16/2017 10:13    Subjective: Without complaints at this time  Discharge Exam: Vitals:   02/16/17 1208 02/16/17 1512  BP: (!) 138/91 119/74  Pulse:  87  Resp:  18  Temp:  98.4 F (36.9 C)  SpO2:  98%   Vitals:   02/16/17 0435 02/16/17 0615 02/16/17 1208 02/16/17 1512  BP: 115/71 115/68 (!) 138/91 119/74  Pulse:    87  Resp: (!) 22   18  Temp: 98.9 F (37.2 C)   98.4 F (36.9 C)  TempSrc: Oral   Oral  SpO2: 98%   98%  Weight: 86.8 kg (191 lb 6.4 oz)     Height:        General: Pt is alert, awake, not in acute distress Cardiovascular: RRR,  S1/S2 +, no rubs, no gallops Respiratory: CTA bilaterally, no wheezing, no rhonchi Abdominal: Soft, NT, ND, bowel sounds + Extremities: no edema, no cyanosis   The results of significant diagnostics from this hospitalization (including imaging, microbiology, ancillary and laboratory) are listed below for reference.     Microbiology: No results found for this or any previous visit (from the past 240 hour(s)).   Labs: BNP (last 3 results) No results for input(s): BNP in the last 8760 hours. Basic Metabolic Panel: Recent Labs  Lab 02/14/17 1221 02/15/17 0502 02/16/17 0219  NA 137 133* 133*  K 3.7 3.1* 3.8  CL 99* 99* 100*  CO2 24 22 24   GLUCOSE 192* 138* 95  BUN 10 7 9   CREATININE 0.84 0.66 0.74  CALCIUM 8.5* 8.0* 8.5*  MG  --  1.1* 2.0   Liver Function Tests: Recent Labs  Lab 02/14/17 1734 02/16/17 0219  AST 81* 30  ALT 65* 39  ALKPHOS 76 66  BILITOT 1.2 1.9*  PROT 7.1 6.6  ALBUMIN 3.4* 3.0*   Recent Labs  Lab 02/14/17 1706 02/16/17 0219  LIPASE 298* 53*   No results for input(s): AMMONIA in the last 168 hours. CBC: Recent Labs  Lab 02/14/17 1221 02/15/17 0502 02/16/17 0219  WBC 20.0* 16.4* 18.7*  HGB 18.3* 15.0 14.2  HCT 50.3 41.4 40.9  MCV 89.5 89.8 93.0  PLT 214 145* 131*   Cardiac Enzymes: No results for input(s): CKTOTAL, CKMB, CKMBINDEX, TROPONINI in the last 168 hours. BNP: Invalid input(s): POCBNP CBG: No results for input(s): GLUCAP in the last 168 hours. D-Dimer No results for input(s): DDIMER in the last 72 hours. Hgb A1c No results for input(s): HGBA1C in the last 72 hours. Lipid Profile No results for input(s): CHOL, HDL, LDLCALC, TRIG, CHOLHDL, LDLDIRECT in the last 72 hours. Thyroid function studies No results for input(s): TSH, T4TOTAL, T3FREE, THYROIDAB in the last 72 hours.  Invalid input(s): FREET3 Anemia work up No results for input(s): VITAMINB12, FOLATE, FERRITIN, TIBC, IRON, RETICCTPCT in the last 72  hours. Urinalysis    Component Value Date/Time   COLORURINE AMBER (A) 02/14/2017 1829   APPEARANCEUR CLOUDY (A) 02/14/2017 1829   LABSPEC 1.021 02/14/2017 1829   PHURINE 5.0 02/14/2017 1829   GLUCOSEU 50 (A) 02/14/2017 1829   HGBUR MODERATE (A) 02/14/2017 1829   BILIRUBINUR NEGATIVE 02/14/2017 1829   KETONESUR 5 (A) 02/14/2017 1829   PROTEINUR 100 (A) 02/14/2017 1829   UROBILINOGEN 0.2 11/13/2016 1234   NITRITE NEGATIVE 02/14/2017 1829   LEUKOCYTESUR NEGATIVE 02/14/2017 1829   Sepsis Labs Invalid input(s): PROCALCITONIN,  WBC,  LACTICIDVEN Microbiology No results found for this or any previous visit (from the past 240 hour(s)).  SIGNED:   Rickey Barbara, MD  [facility] 02/16/2017, 5:13 PM  If 7PM-7AM, please contact night-coverage [email or web address] Password [email or web address]

## 2017-02-26 MED FILL — FOLIC ACID 1 MG TABLET: 1 | 30 days supply | Qty: 30 | Fill #3

## 2017-04-11 DIAGNOSIS — F320 Major depressive disorder, single episode, mild: Secondary | ICD-10-CM | POA: Insufficient documentation

## 2017-04-11 DIAGNOSIS — F419 Anxiety disorder, unspecified: Secondary | ICD-10-CM | POA: Insufficient documentation

## 2017-05-20 ENCOUNTER — Encounter (HOSPITAL_COMMUNITY): Payer: Self-pay | Admitting: Emergency Medicine

## 2017-05-20 ENCOUNTER — Emergency Department (HOSPITAL_COMMUNITY): Payer: Self-pay

## 2017-05-20 ENCOUNTER — Inpatient Hospital Stay (HOSPITAL_COMMUNITY)
Admission: EM | Admit: 2017-05-20 | Discharge: 2017-05-21 | DRG: 313 | Disposition: A | Discharge status: Home / Self Care | Payer: Self-pay | Attending: Internal Medicine | Admitting: Internal Medicine

## 2017-05-20 DIAGNOSIS — Z823 Family history of stroke: Secondary | ICD-10-CM

## 2017-05-20 DIAGNOSIS — F17210 Nicotine dependence, cigarettes, uncomplicated: Secondary | ICD-10-CM | POA: Diagnosis present

## 2017-05-20 DIAGNOSIS — F419 Anxiety disorder, unspecified: Secondary | ICD-10-CM | POA: Diagnosis present

## 2017-05-20 DIAGNOSIS — F329 Major depressive disorder, single episode, unspecified: Secondary | ICD-10-CM | POA: Diagnosis present

## 2017-05-20 DIAGNOSIS — F10930 Alcohol use, unspecified with withdrawal, uncomplicated: Secondary | ICD-10-CM

## 2017-05-20 DIAGNOSIS — R0789 Other chest pain: Principal | ICD-10-CM | POA: Diagnosis present

## 2017-05-20 DIAGNOSIS — I10 Essential (primary) hypertension: Secondary | ICD-10-CM | POA: Diagnosis present

## 2017-05-20 DIAGNOSIS — F10230 Alcohol dependence with withdrawal, uncomplicated: Secondary | ICD-10-CM

## 2017-05-20 DIAGNOSIS — R9431 Abnormal electrocardiogram [ECG] [EKG]: Secondary | ICD-10-CM

## 2017-05-20 DIAGNOSIS — R079 Chest pain, unspecified: Secondary | ICD-10-CM

## 2017-05-20 DIAGNOSIS — Z801 Family history of malignant neoplasm of trachea, bronchus and lung: Secondary | ICD-10-CM

## 2017-05-20 DIAGNOSIS — F10239 Alcohol dependence with withdrawal, unspecified: Secondary | ICD-10-CM

## 2017-05-20 DIAGNOSIS — Z8249 Family history of ischemic heart disease and other diseases of the circulatory system: Secondary | ICD-10-CM

## 2017-05-20 LAB — HEPATIC FUNCTION PANEL
ALK PHOS: 57 U/L (ref 38–126)
ALT: 43 U/L (ref 17–63)
AST: 57 U/L — ABNORMAL HIGH (ref 15–41)
Albumin: 4.1 g/dL (ref 3.5–5.0)
BILIRUBIN INDIRECT: 0.6 mg/dL (ref 0.3–0.9)
BILIRUBIN TOTAL: 0.7 mg/dL (ref 0.3–1.2)
Bilirubin, Direct: 0.1 mg/dL (ref 0.1–0.5)
TOTAL PROTEIN: 8.1 g/dL (ref 6.5–8.1)

## 2017-05-20 LAB — CBC
HCT: 49.6 % (ref 39.0–52.0)
HEMOGLOBIN: 17.7 g/dL — AB (ref 13.0–17.0)
MCH: 32.8 pg (ref 26.0–34.0)
MCHC: 35.7 g/dL (ref 30.0–36.0)
MCV: 92 fL (ref 78.0–100.0)
PLATELETS: 244 10*3/uL (ref 150–400)
RBC: 5.39 MIL/uL (ref 4.22–5.81)
RDW: 12.8 % (ref 11.5–15.5)
WBC: 13.1 10*3/uL — ABNORMAL HIGH (ref 4.0–10.5)

## 2017-05-20 LAB — PROTIME-INR
INR: 1.07
Prothrombin Time: 13.8 seconds (ref 11.4–15.2)

## 2017-05-20 LAB — LIPASE, BLOOD: LIPASE: 42 U/L (ref 11–51)

## 2017-05-20 LAB — I-STAT TROPONIN, ED: TROPONIN I, POC: 0 ng/mL (ref 0.00–0.08)

## 2017-05-20 LAB — BASIC METABOLIC PANEL
ANION GAP: 15 (ref 5–15)
BUN: 10 mg/dL (ref 6–20)
CALCIUM: 9.4 mg/dL (ref 8.9–10.3)
CO2: 20 mmol/L — AB (ref 22–32)
CREATININE: 1.09 mg/dL (ref 0.61–1.24)
Chloride: 97 mmol/L — ABNORMAL LOW (ref 101–111)
GFR calc non Af Amer: >60 mL/min (ref >60)
Glucose, Bld: 115 mg/dL — ABNORMAL HIGH (ref 65–99)
Potassium: 3.5 mmol/L (ref 3.5–5.1)
Sodium: 132 mmol/L — ABNORMAL LOW (ref 135–145)

## 2017-05-20 LAB — MAGNESIUM: MAGNESIUM: 1.5 mg/dL — AB (ref 1.7–2.4)

## 2017-05-20 LAB — APTT: APTT: 27 s (ref 24–36)

## 2017-05-20 LAB — TROPONIN I: Troponin I: <0.03 ng/mL (ref <0.03)

## 2017-05-20 LAB — ETHANOL: Alcohol, Ethyl (B): 12 mg/dL — ABNORMAL HIGH (ref <10)

## 2017-05-20 IMAGING — DX DG CHEST 2V
2 series · 2 of 2 positions shown · non-contrast
Comparison: [DATE] chest radiograph.

CLINICAL DATA: Chest pain

EXAM:
CHEST - 2 VIEW

[chest pa]
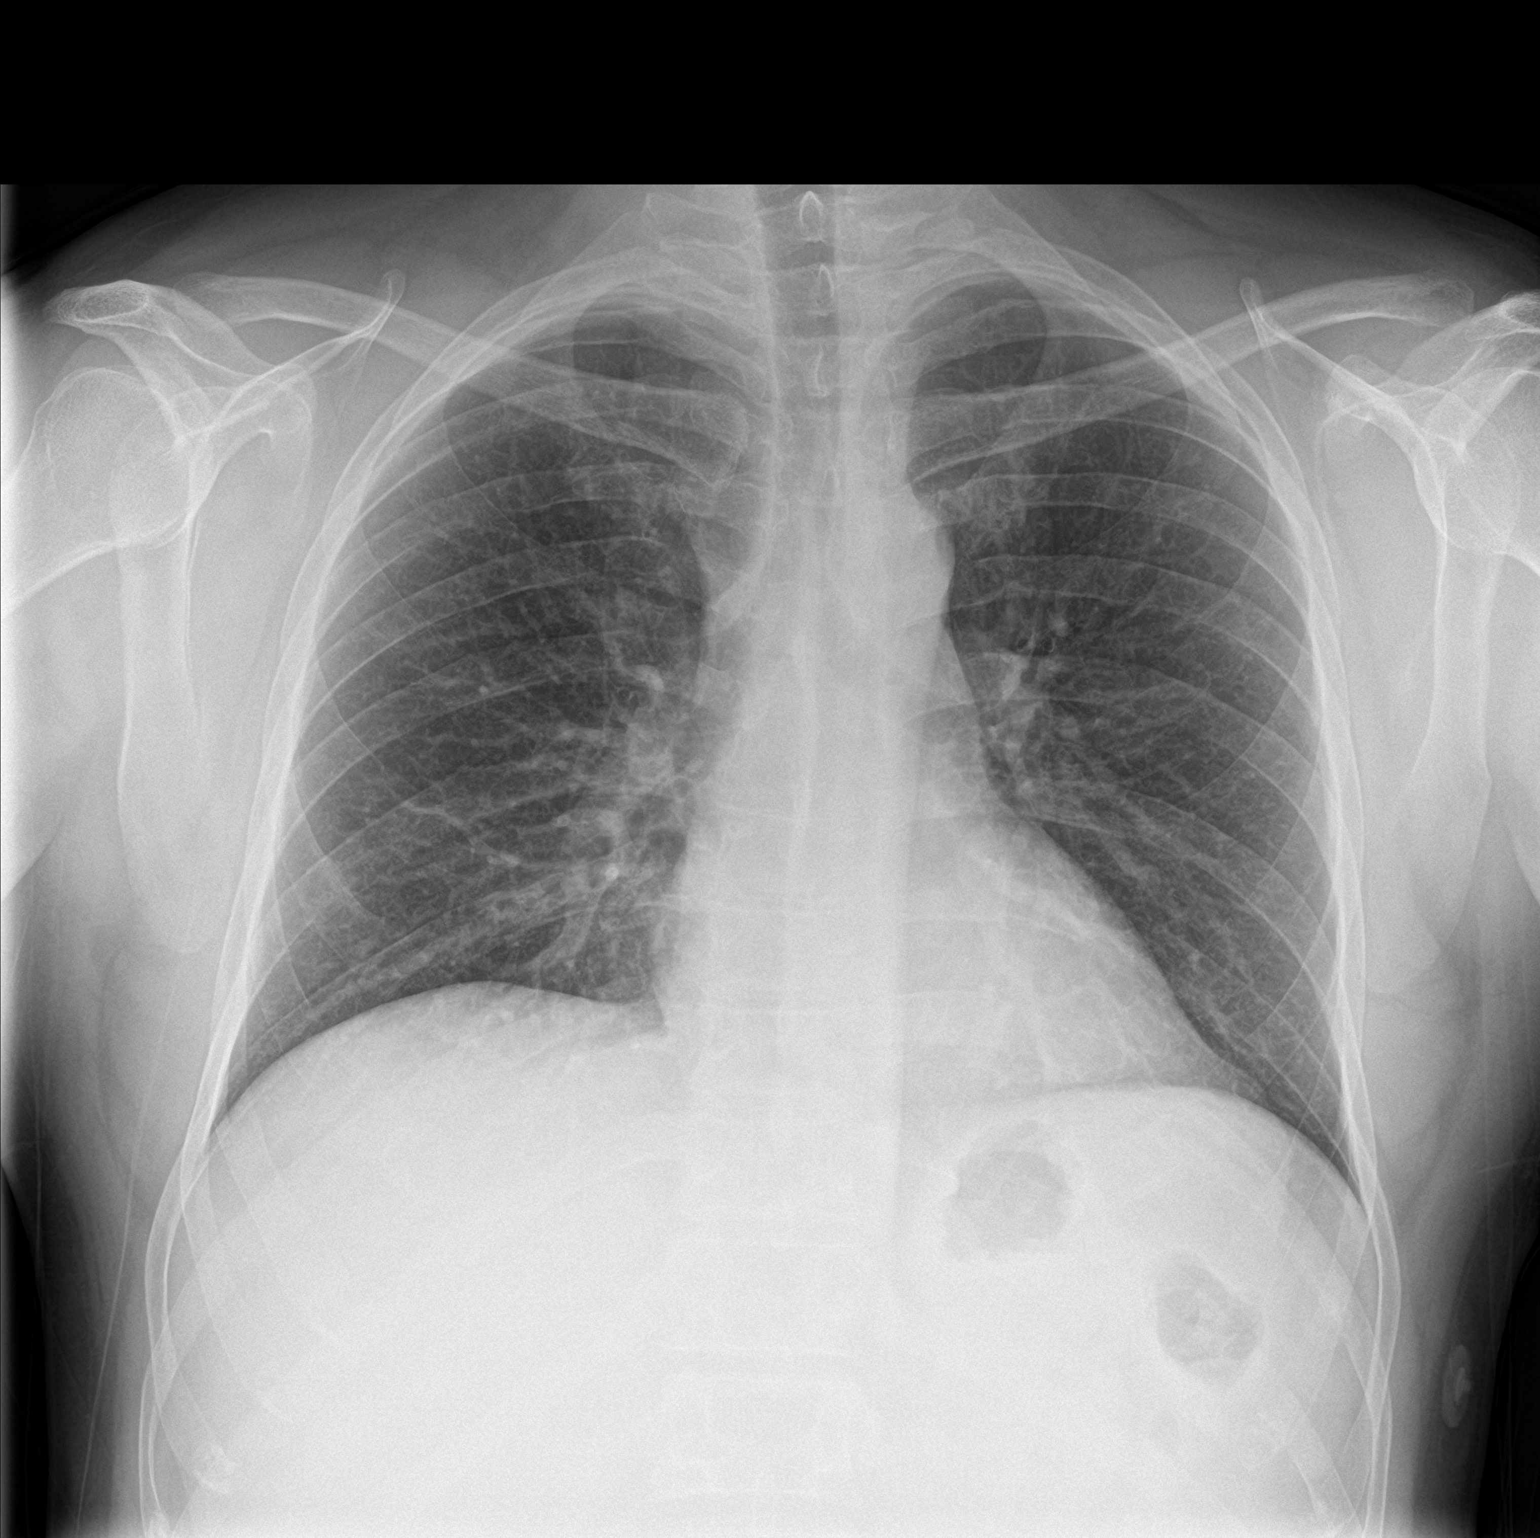

[chest lat]
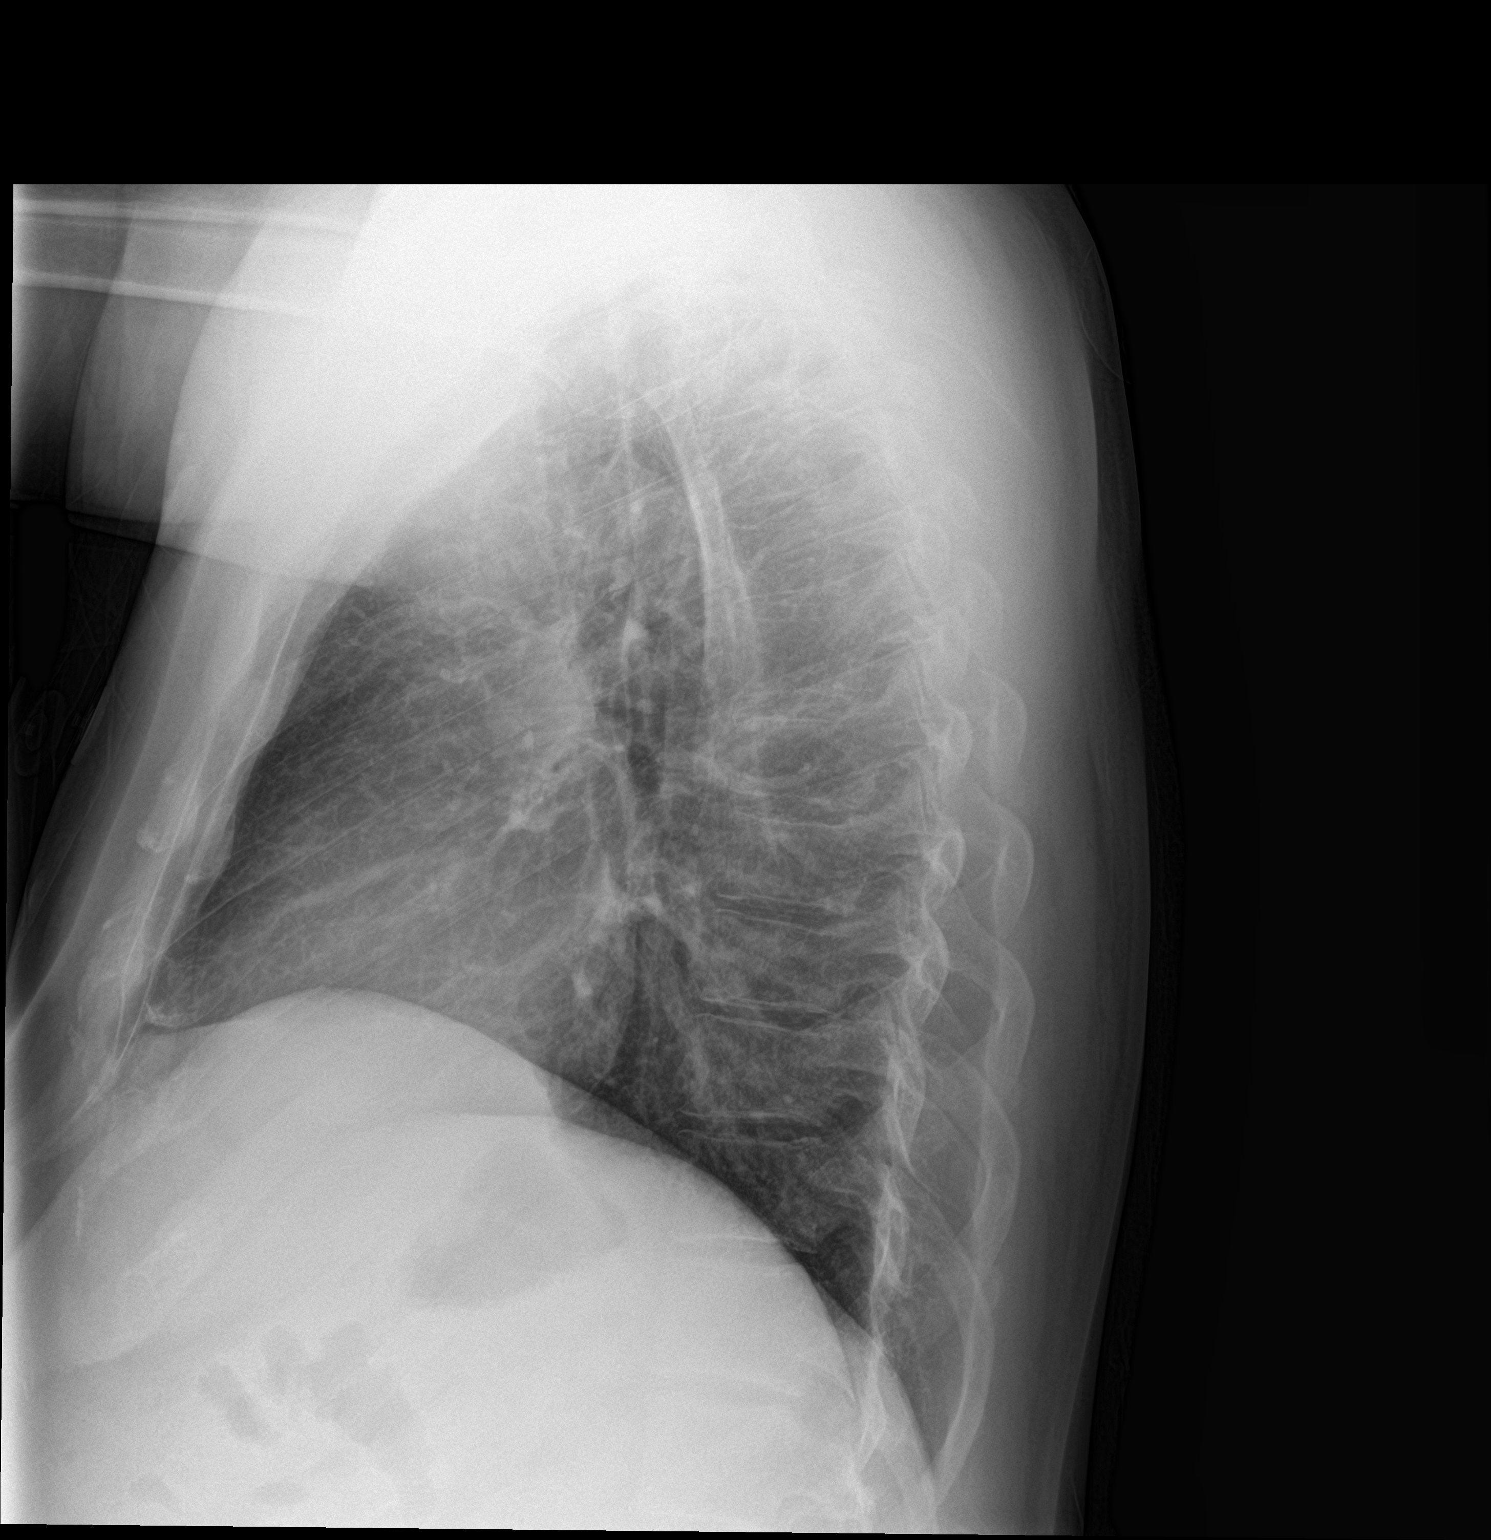

[2 of 2 positions shown; findings below may reference images not displayed]

FINDINGS: Stable cardiomediastinal silhouette with normal heart size. No
pneumothorax. No pleural effusion. Lungs appear clear, with no acute
consolidative airspace disease and no pulmonary edema.
IMPRESSION: No active cardiopulmonary disease.

## 2017-05-20 MED ORDER — PANTOPRAZOLE SODIUM 40 MG PO TBEC
40.0000 mg | DELAYED_RELEASE_TABLET | Freq: Every day | ORAL | Status: DC
  Administered 2017-05-21: 40 mg via ORAL
  Filled 2017-05-20: qty 1

## 2017-05-20 MED ORDER — LORAZEPAM 2 MG/ML IJ SOLN: 1.0000 mg | Freq: Four times a day (QID) | INTRAMUSCULAR | Status: DC | PRN

## 2017-05-20 MED ORDER — VITAMIN B-1 100 MG PO TABS
100.0000 mg | ORAL_TABLET | Freq: Every day | ORAL | Status: DC
  Administered 2017-05-21: 100 mg via ORAL
  Filled 2017-05-20: qty 1

## 2017-05-20 MED ORDER — THIAMINE HCL 100 MG/ML IJ SOLN: 100.0000 mg | Freq: Every day | INTRAMUSCULAR | Status: DC

## 2017-05-20 MED ORDER — LORAZEPAM 2 MG/ML IJ SOLN
2.0000 mg | Freq: Once | INTRAMUSCULAR | Status: AC
  Administered 2017-05-20: 2 mg via INTRAVENOUS
  Filled 2017-05-20: qty 1

## 2017-05-20 MED ORDER — SODIUM CHLORIDE 0.9 % IV BOLUS
1000.0000 mL | Freq: Once | INTRAVENOUS | Status: AC
  Administered 2017-05-20: 1000 mL via INTRAVENOUS

## 2017-05-20 MED ORDER — CLONIDINE HCL 0.2 MG PO TABS
0.2000 mg | ORAL_TABLET | Freq: Three times a day (TID) | ORAL | Status: DC
  Administered 2017-05-20 – 2017-05-21 (×2): 0.2 mg via ORAL
  Filled 2017-05-20 (×2): qty 1

## 2017-05-20 MED ORDER — LORAZEPAM 2 MG/ML IJ SOLN: 0.0000 mg | Freq: Four times a day (QID) | INTRAMUSCULAR | Status: DC

## 2017-05-20 MED ORDER — ADULT MULTIVITAMIN W/MINERALS CH
1.0000 | ORAL_TABLET | Freq: Every day | ORAL | Status: DC
  Administered 2017-05-21: 1 via ORAL
  Filled 2017-05-20: qty 1

## 2017-05-20 MED ORDER — LORAZEPAM 1 MG PO TABS
1.0000 mg | ORAL_TABLET | Freq: Once | ORAL | Status: AC
  Administered 2017-05-20: 1 mg via ORAL
  Filled 2017-05-20: qty 1

## 2017-05-20 MED ORDER — LORAZEPAM 1 MG PO TABS: 0.0000 mg | ORAL_TABLET | Freq: Two times a day (BID) | ORAL | Status: DC

## 2017-05-20 MED ORDER — HEPARIN SODIUM (PORCINE) 5000 UNIT/ML IJ SOLN
5000.0000 [IU] | Freq: Three times a day (TID) | INTRAMUSCULAR | Status: DC
  Administered 2017-05-20 – 2017-05-21 (×2): 5000 [IU] via SUBCUTANEOUS

## 2017-05-20 MED ORDER — AMLODIPINE BESYLATE 5 MG PO TABS
5.0000 mg | ORAL_TABLET | Freq: Every day | ORAL | Status: DC
  Administered 2017-05-21: 5 mg via ORAL
  Filled 2017-05-20: qty 1

## 2017-05-20 MED ORDER — MAGNESIUM SULFATE 2 GM/50ML IV SOLN
2.0000 g | Freq: Once | INTRAVENOUS | Status: AC
  Administered 2017-05-21: 2 g via INTRAVENOUS
  Filled 2017-05-20: qty 50

## 2017-05-20 MED ORDER — LORAZEPAM 2 MG/ML IJ SOLN: 0.0000 mg | Freq: Two times a day (BID) | INTRAMUSCULAR | Status: DC

## 2017-05-20 MED ORDER — SODIUM CHLORIDE 0.9% FLUSH
3.0000 mL | Freq: Two times a day (BID) | INTRAVENOUS | Status: DC
  Administered 2017-05-21 (×2): 3 mL via INTRAVENOUS

## 2017-05-20 MED ORDER — LORAZEPAM 1 MG PO TABS: 0.0000 mg | ORAL_TABLET | Freq: Four times a day (QID) | ORAL | Status: DC

## 2017-05-20 MED ORDER — VITAMIN B-1 100 MG PO TABS: 100.0000 mg | ORAL_TABLET | Freq: Every day | ORAL | Status: DC

## 2017-05-20 MED ORDER — VENLAFAXINE HCL ER 37.5 MG PO CP24
37.5000 mg | ORAL_CAPSULE | Freq: Every day | ORAL | Status: DC
  Administered 2017-05-21: 37.5 mg via ORAL
  Filled 2017-05-20: qty 1

## 2017-05-20 MED ORDER — LORAZEPAM 1 MG PO TABS: 1.0000 mg | ORAL_TABLET | Freq: Four times a day (QID) | ORAL | Status: DC | PRN

## 2017-05-20 MED ORDER — FOLIC ACID 1 MG PO TABS
1.0000 mg | ORAL_TABLET | Freq: Every day | ORAL | Status: DC
  Administered 2017-05-21: 1 mg via ORAL
  Filled 2017-05-20: qty 1

## 2017-05-20 NOTE — ED Triage Notes (Signed)
Pt reports centralized chest pain radiates to the left that comes and goes that started around noon while driving. Pt hypertensive, states he took his daily meds for this already. Pt also requesting detox from ETOH, states his last drink was at 0800 and it was 4 shots of liquor. He normally drinks a fifth a day.

## 2017-05-20 NOTE — ED Provider Notes (Signed)
[name] Provider Note   CSN: [id number] Arrival date & time: 05/20/17  1539     History   Chief Complaint Chief Complaint  Patient presents with  . Chest Pain    HPI Peter [name] is a 37 y.o. male.  Patient presents with anterior chest pain, central radiating to the left arm intermittent central new today. Patient has history of high blood pressure and cigarette smoking. Patient is been drinking alcohol heavily intermittently for years. Last drink was at 8:00 this morning and include 4 shots of liquor. Patient has history of drug abuse. Patient denies fevers or chills has nonproductive cough. No blood clot history and denies blood clot risk factors. No shortness of breath. No chest pain currently. Patient does want help for his alcohol abuse. Patient denies cardiac history     Past Medical History:  Diagnosis Date  . Alcoholism (HCC)   . Hypertension   . Substance abuse [facility](HCC)     Patient Active Problem List   Diagnosis Date Noted  . Hypomagnesemia 02/15/2017  . Abdominal pain 02/14/2017  . Acute pancreatitis 02/14/2017  . Alcoholic hepatitis without ascites 02/14/2017  . Hypokalemia 12/03/2016  . Leukocytosis 12/03/2016  . Alcohol withdrawal (HCC) 12/02/2016  . Alcohol dependence with other alcohol-induced disorder (HCC) 11/18/2016  . 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 Essential hypertension 11/18/2016  . Elevated liver enzymes 11/14/2016    Past Surgical History:  Procedure Laterality Date  . DENTAL SURGERY          Home Medications    Prior to Admission medications   Medication Sig Start Date End Date Taking? Authorizing Provider  amLODipine (NORVASC) 5 MG tablet Take 1 tablet (5 mg total) by mouth daily. 02/16/17  Yes Jerald Kiefhiu, Stephen K, MD  cloNIDine (CATAPRES) 0.2 MG tablet Take 1 tablet (0.2 mg total) by mouth 3 (three) times daily. 02/16/17  Yes Jerald Kiefhiu, Stephen K, MD  folic acid (FOLVITE) 1 MG tablet Take 1  tablet (1 mg total) by mouth daily. 10/13/16  Yes Massie MaroonHollis, Lachina M, FNP  hydrOXYzine (ATARAX/VISTARIL) 25 MG tablet Take 1-2 tablets (25-50 mg total) by mouth every 6 (six) hours as needed for anxiety. 01/20/17  Yes Alvira MondaySchlossman, Erin, MD  losartan (COZAAR) 50 MG tablet Take 1 tablet (50 mg total) by mouth daily. Patient taking differently: Take 100 mg by mouth daily.  01/20/17 05/20/17 Yes Alvira MondaySchlossman, Erin, MD  methocarbamol (ROBAXIN) 500 MG tablet Take 1-2 tablets (500-1,000 mg total) by mouth every 6 (six) hours as needed for muscle spasms. 01/20/17  Yes Alvira MondaySchlossman, Erin, MD  OVER THE COUNTER MEDICATION Take 4 capsules by mouth See admin instructions. Juice plus -  Take 2 juice capsules and 2 vegetable capsules every morning   Yes [provider]  ranitidine (ZANTAC) 150 MG capsule Take 1 capsule (150 mg total) by mouth 2 (two) times daily. Patient taking differently: Take 150 mg by mouth daily as needed for heartburn.  01/20/17  Yes Alvira MondaySchlossman, Erin, MD  thiamine (VITAMIN B-1) 100 MG tablet Take 1 tablet (100 mg total) by mouth daily. 10/13/16  Yes Massie MaroonHollis, Lachina M, FNP  venlafaxine XR (EFFEXOR-XR) 37.5 MG 24 hr capsule Take 1 capsule (37.5 mg total) by mouth daily with breakfast. 12/08/16 05/20/17 Yes Roberto ScalesNettey, Shayla D, MD  ondansetron (ZOFRAN ODT) 4 MG disintegrating tablet Take 1 tablet (4 mg total) by mouth every 8 (eight) hours as needed for nausea or vomiting. Patient not taking: Reported on 05/20/2017 01/20/17   Schlossman,  Denny Peon, MD    Family History Family History  Problem Relation Age of Onset  . Hypertension Father   . Stroke Father   . CAD Father   . Cancer Paternal Grandfather        Lung  . Hyperlipidemia Paternal Grandfather   . Hypertension Paternal Grandfather     Social History Social History   Tobacco Use  . Smoking status: Current Every Day Smoker    Packs/day: 0.50    Types: Cigarettes  . Smokeless tobacco: Never Used  Substance Use Topics  . Alcohol use: Yes      Comment: fifth a day whiskey. nothing in 3 days (10/13/2016)  . Drug use: No     Allergies   Patient has no known allergies.   Review of Systems Review of Systems  Constitutional: Negative for chills and fever.  HENT: Negative for congestion.   Eyes: Negative for visual disturbance.  Respiratory: Negative for shortness of breath.   Cardiovascular: Positive for chest pain.  Gastrointestinal: Negative for abdominal pain and vomiting.  Genitourinary: Negative for dysuria and flank pain.  Musculoskeletal: Negative for back pain, neck pain and neck stiffness.  Skin: Negative for rash.  Neurological: Positive for light-headedness. Negative for headaches.     Physical Exam Updated Vital Signs BP (!) 151/111   Pulse (!) 115   Temp 98.4 F (36.9 C)   Resp 18   Ht 5\' 10"  (1.778 m)   Wt 95.3 kg (210 lb)   SpO2 100%   BMI 30.13 kg/m   Physical Exam  Constitutional: He is oriented to person, place, and time. He appears well-developed and well-nourished.  HENT:  Head: Normocephalic and atraumatic.  Eyes: Conjunctivae are normal. Right eye exhibits no discharge. Left eye exhibits no discharge.  Neck: Normal range of motion. Neck supple. No tracheal deviation present.  Cardiovascular: Regular rhythm and normal pulses. Tachycardia present.  Pulmonary/Chest: Effort normal and breath sounds normal.  Abdominal: Soft. He exhibits no distension. There is no tenderness. There is no guarding.  Musculoskeletal: He exhibits no edema.  Neurological: He is alert and oriented to person, place, and time.  Skin: Skin is warm. No rash noted.  Psychiatric: He has a normal mood and affect.  Nursing note and vitals reviewed.    ED Treatments / Results  Labs (all labs ordered are listed, but only abnormal results are displayed) Labs Reviewed  BASIC METABOLIC PANEL - Abnormal; Notable for the following components:      Result Value   Sodium 132 (*)    Chloride 97 (*)    CO2 20 (*)     Glucose, Bld 115 (*)    All other components within normal limits  CBC - Abnormal; Notable for the following components:   WBC 13.1 (*)    Hemoglobin 17.7 (*)    All other components within normal limits  ETHANOL - Abnormal; Notable for the following components:   Alcohol, Ethyl (B) 12 (*)    All other components within normal limits  RAPID URINE DRUG SCREEN, HOSP PERFORMED  HEPATIC FUNCTION PANEL  MAGNESIUM  LIPASE, BLOOD  I-STAT TROPONIN, ED    EKG EKG Interpretation  Date/Time:  Sunday May 20 2017 16:25:43 EDT Ventricular Rate:  123 PR Interval:  132 QRS Duration: 84 QT Interval:  278 QTC Calculation: 398 R Axis:   86 Text Interpretation:  Sinus tachycardia T wave abnormality, consider inferior ischemia Abnormal ECG Confirmed by Blane Ohara [phone]) on 05/20/2017 5:57:42 PM   Radiology  Dg Chest 2 View  Result Date: 05/20/2017 CLINICAL DATA:  Chest pain EXAM: CHEST - 2 VIEW COMPARISON:  02/14/2017 chest radiograph. FINDINGS: Stable cardiomediastinal silhouette with normal heart size. No pneumothorax. No pleural effusion. Lungs appear clear, with no acute consolidative airspace disease and no pulmonary edema. IMPRESSION: No active cardiopulmonary disease. Electronically Signed   By: Delbert Phenix M.D.   On: 05/20/2017 17:01    Procedures Procedures (including critical care time)  Medications Ordered in ED Medications  sodium chloride 0.9 % bolus 1,000 mL (1,000 mLs Intravenous New Bag/Given 05/20/17 1850)  LORazepam (ATIVAN) injection 0-4 mg (has no administration in time range)    Or  LORazepam (ATIVAN) tablet 0-4 mg (has no administration in time range)  LORazepam (ATIVAN) injection 0-4 mg (has no administration in time range)    Or  LORazepam (ATIVAN) tablet 0-4 mg (has no administration in time range)  thiamine (VITAMIN B-1) tablet 100 mg (has no administration in time range)    Or  thiamine (B-1) injection 100 mg (has no administration in time range)    LORazepam (ATIVAN) tablet 1 mg (1 mg Oral Given 05/20/17 1637)  LORazepam (ATIVAN) injection 2 mg (2 mg Intravenous Given 05/20/17 1900)     Initial Impression / Assessment and Plan / ED Course  I have reviewed the triage vital signs and the nursing notes.  Pertinent labs & imaging results that were available during my care of the patient were reviewed by me and considered in my medical decision making (see chart for details).    Patient presents with transient chest pain and EKG changes including T-wave inversions reviewed on EKG. Patient currently chest pain-free. Patient clinically alcohol withdrawal without significant tremor or seizure activity. Patient does have sinus tachycardia. Plan for admission for combination of chest pain/EKG changes and alcohol withdrawal. Patient does want help for his alcohol abuse. Patient improved with Ativan in the ER. Pt denies blood clot risk factors.    The patients results and plan were reviewed and discussed.   Any x-rays performed were independently reviewed by myself.   Differential diagnosis were considered with the presenting HPI.  Medications  amLODipine (NORVASC) tablet 5 mg (has no administration in time range)  cloNIDine (CATAPRES) tablet 0.2 mg (0.2 mg Oral Given 05/20/17 2255)  folic acid (FOLVITE) tablet 1 mg (has no administration in time range)  thiamine (VITAMIN B-1) tablet 100 mg (has no administration in time range)  venlafaxine XR (EFFEXOR-XR) 24 hr capsule 37.5 mg (has no administration in time range)  heparin injection 5,000 Units (5,000 Units Subcutaneous Given 05/20/17 2359)  sodium chloride flush (NS) 0.9 % injection 3 mL (3 mLs Intravenous Given 05/21/17 0001)  LORazepam (ATIVAN) tablet 1 mg (has no administration in time range)    Or  LORazepam (ATIVAN) injection 1 mg (has no administration in time range)  multivitamin with minerals tablet 1 tablet (has no administration in time range)  magnesium sulfate IVPB 2 g 50 mL (2 g  Intravenous New Bag/Given 05/21/17 0032)  pantoprazole (PROTONIX) EC tablet 40 mg (has no administration in time range)  LORazepam (ATIVAN) tablet 1 mg (1 mg Oral Given 05/20/17 1637)  sodium chloride 0.9 % bolus 1,000 mL (0 mLs Intravenous Stopped 05/20/17 2134)  LORazepam (ATIVAN) injection 2 mg (2 mg Intravenous Given 05/20/17 1900)    Vitals:   05/20/17 2100 05/20/17 2115 05/20/17 2130 05/20/17 2213  BP: (!) 153/108 (!) 153/113 (!) 161/119 (!) 155/111  Pulse: (!) 127 (!) 119 (!) 129 [place])  115  Resp: (!) 21 (!) 21 14 17   Temp:    99 F (37.2 C)  TempSrc:    Oral  SpO2: 97% 96% 96% 97%  Weight:      Height:    5\' 10"  (1.778 m)    Final diagnoses:  Acute chest pain  Alcohol withdrawal syndrome without complication (HCC)  EKG abnormalities    Admission/ observation were discussed with the admitting physician, patient and/or family and they are comfortable with the plan.    Final Clinical Impressions(s) / ED Diagnoses   Final diagnoses:  Acute chest pain  Alcohol withdrawal syndrome without complication [facility])    ED Discharge Orders    None       Blane Ohara, MD 05/21/17 0106

## 2017-05-20 NOTE — H&P (Signed)
History and Physical   Dannon Nguyenthi ZOX:[id number] DOB: 1981-01-09 DOA: 05/20/2017  PCP: Massie Maroon, FNP  Chief Complaint: chest pain  HPI: this is a 37 year old man with medical problems including prior opioid dependence, ongoing alcohol abuse, and hypertension presenting with chest pain and alcohol withdrawal/cessation support.  He reports acute onset of left-sided chest pain, occurred at noon watching television, sharp, nonradiating. Reports it is intermittent, lasts for seconds. Associated with shortness of breath at times epigastric pain. He reports drinking alcohol on a daily basis, reports checking fifth of whiskey daily. No history of seizures.  He reports formerly being seen at the methadone clinic, however he reports no longer being on methadone. 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 He reports having some abdominal pain currently that is consistent without pain.  Current other associated symptoms include a sense of restlessness.  ED Course: in the emergency department vital signs are normal for tachycardia with heart rate of 129, increased history rate to 26, systolic blood pressure the 150s, EKG revealed T-wave inversions in inferior leads, potassium 3.5, crit 1.09. White count of 13.1, hemoglobin 17.7. Troponin undetectable.Chest x-ray without acute findings.  Lipase within normal limits. PTT and INR within normal limits.  Hepatic function panel with AST of 55, ALT 41.  Hospital medicine consulted for further management.  Review of Systems: A complete ROS was obtained; pertinent positives negatives are denoted in the HPI. Otherwise, all systems are negative.   Past Medical History:  Diagnosis Date  . Alcoholism (HCC)   . Hypertension   . Substance abuse (HCC)    Social History   Socioeconomic History  . Marital status: Single    Spouse name: Not on file  . Number of children: Not on file  . Years of education: Not on file    . Highest education level: Not on file  Occupational History  . Not on file  Social Needs  . Financial resource strain: Not on file  . Food insecurity:    Worry: Not on file    Inability: Not on file  . Transportation needs:    Medical: Not on file    Non-medical: Not on file  Tobacco Use  . Smoking status: Current Every Day Smoker    Packs/day: 0.50    Types: Cigarettes  . Smokeless tobacco: Never Used  Substance and Sexual Activity  . Alcohol use: Yes    Comment: fifth a day whiskey. nothing in 3 days (10/13/2016)  . Drug use: No  . Sexual activity: Yes    Birth control/protection: Condom  Lifestyle  . Physical activity:    Days per week: Not on file    Minutes per session: Not on file  . Stress: Not on file  Relationships  . Social connections:    Talks on phone: Not on file    Gets together: Not on file    Attends religious service: Not on file    Active member of club or organization: Not on file    Attends meetings of clubs or organizations: Not on file    Relationship status: Not on file  . Intimate partner violence:    Fear of current or ex partner: Not on file    Emotionally abused: Not on file    Physically abused: Not on file    Forced sexual activity: Not on file  Other Topics Concern  . Not on file  Social History Narrative  . Not on file   Family  History  Problem Relation Age of Onset  . Hypertension Father   . Stroke Father   . CAD Father   . Cancer Paternal Grandfather        Lung  . Hyperlipidemia Paternal Grandfather   . Hypertension Paternal Grandfather     Physical Exam: Vitals:   05/20/17 1623 05/20/17 1624 05/20/17 1626 05/20/17 1900  BP: (!) 147/125  (!) 148/112 (!) 151/111  Pulse: (!) 129  (!) 128 (!) 115  Resp: 18     Temp: 98.4 F (36.9 C)     SpO2: 100%     Weight:  95.3 kg (210 lb)    Height:  5\' 10"  (1.778 m)     General: Appears mildly anxious, obese white man ENT: Grossly normal hearing, MMM. Cardiovascular: HR  ~120. No M/R/G. No LE edema.  Respiratory: CTA bilaterally. No wheezes or crackles. Normal respiratory effort. Breathing room air. Abdomen: Soft, mildly tender in epigastrium without rebound or guarding.  Mild distension noted. Skin: No rash or induration seen on limited exam. Mild flushing of head and neck region. Musculoskeletal: Grossly normal tone BUE/BLE. Appropriate ROM.  Psychiatric: Reports anxious. Neurologic: Moves all extremities in coordinated fashion.  I have personally reviewed the following labs, culture data, and imaging studies.  Assessment/Plan:  #Chest pain Uncertain etiology, reports he has had similar experiences in the past associated with ETOH withdrawal.  Troponin now negative x 2. EKG with non-specific changes.  Suspect somehow relate to anxiety associated with ETOH withdrawal. However, other etiologies (gastritis, VTE, musculoskeletal, etc) not completely excluded. Not having CP at this time. Plan:  Continue to monitor on tele.  If recurs, consider further diagnostics.  #ETOH withdrawal Longstanding hx of ETOH abuse, last ETOH intake was 8a on 05/20/2017 per his report.   Plan: Monitor for ETOH symptoms with CIWA protocol; benzodiazepines per protocol. Will need ongoing counseling and setting up with community resources to ensure optimization. Continue folic acid +thiamine.  #Other problems: -HTN: continue CCB and clonidine; hold ARB for now; also reports he it taking HCTZ 25 mg po daily - hold for now; continue to monitor and consider re-start in AM -Hypomagnesemia: 1.5 on admission, IV mg ordered -Anxiety / depression: continue venlafaxine -Hx of substance abuse: f/u UDS -Epigastric pain: normal lipase argues against pancreatitis, more likely ETOH related gastritis, start PPI  DVT prophylaxis: [place] hep Code Status: full code Disposition Plan: Anticipate D/C home in 1-2 days Consults called: none Admission status: admit to hospital medicine, telemetry given  report of prior CP   Laurell RoofPatrick Lorann Tani, MD [facility] [phone]  If 7PM-7AM, please contact night-coverage [email or web address] Password [email or web address]

## 2017-05-20 NOTE — ED Notes (Signed)
Per Dr. Jodi MourningZavitz pt does not need TTS.

## 2017-05-20 NOTE — ED Notes (Signed)
Admitting at bedside 

## 2017-05-21 ENCOUNTER — Other Ambulatory Visit: Payer: Self-pay

## 2017-05-21 DIAGNOSIS — R0789 Other chest pain: Principal | ICD-10-CM

## 2017-05-21 LAB — COMPREHENSIVE METABOLIC PANEL
ALK PHOS: 56 U/L (ref 38–126)
ALT: 39 U/L (ref 17–63)
AST: 50 U/L — ABNORMAL HIGH (ref 15–41)
Albumin: 3.3 g/dL — ABNORMAL LOW (ref 3.5–5.0)
Anion gap: 12 (ref 5–15)
BUN: 9 mg/dL (ref 6–20)
CALCIUM: 8.7 mg/dL — AB (ref 8.9–10.3)
CO2: 24 mmol/L (ref 22–32)
CREATININE: 1.16 mg/dL (ref 0.61–1.24)
Chloride: 99 mmol/L — ABNORMAL LOW (ref 101–111)
GFR calc Af Amer: >60 mL/min (ref >60)
Glucose, Bld: 137 mg/dL — ABNORMAL HIGH (ref 65–99)
Potassium: 3.5 mmol/L (ref 3.5–5.1)
Sodium: 135 mmol/L (ref 135–145)
Total Bilirubin: 1.1 mg/dL (ref 0.3–1.2)
Total Protein: 6.6 g/dL (ref 6.5–8.1)

## 2017-05-21 LAB — CBC
HCT: 45.8 % (ref 39.0–52.0)
Hemoglobin: 15.9 g/dL (ref 13.0–17.0)
MCH: 32.5 pg (ref 26.0–34.0)
MCHC: 34.7 g/dL (ref 30.0–36.0)
MCV: 93.7 fL (ref 78.0–100.0)
PLATELETS: 223 10*3/uL (ref 150–400)
RBC: 4.89 MIL/uL (ref 4.22–5.81)
RDW: 13 % (ref 11.5–15.5)
WBC: 11.4 10*3/uL — AB (ref 4.0–10.5)

## 2017-05-21 LAB — TROPONIN I: Troponin I: <0.03 ng/mL (ref <0.03)

## 2017-05-21 MED ORDER — VITAMIN B-1 100 MG PO TABS: 100.0000 mg | ORAL_TABLET | Freq: Every day | ORAL | 0 refills | Status: DC

## 2017-05-21 MED ORDER — PANTOPRAZOLE SODIUM 40 MG PO TBEC: 40.0000 mg | DELAYED_RELEASE_TABLET | Freq: Two times a day (BID) | ORAL | 0 refills | Status: DC

## 2017-05-21 MED ORDER — CLONIDINE HCL 0.2 MG PO TABS: 0.2000 mg | ORAL_TABLET | Freq: Three times a day (TID) | ORAL | 0 refills | Status: DC

## 2017-05-21 MED ORDER — AMLODIPINE BESYLATE 5 MG PO TABS: 5.0000 mg | ORAL_TABLET | Freq: Every day | ORAL | 0 refills | Status: DC

## 2017-05-21 MED ORDER — NICOTINE 14 MG/24HR TD PT24: 14.0000 mg | MEDICATED_PATCH | TRANSDERMAL | 0 refills | Status: DC

## 2017-05-21 MED ORDER — FOLIC ACID 1 MG PO TABS: 1.0000 mg | ORAL_TABLET | Freq: Every day | ORAL | 0 refills | Status: DC

## 2017-05-21 MED ORDER — LOSARTAN POTASSIUM 50 MG PO TABS: 100.0000 mg | ORAL_TABLET | Freq: Every day | ORAL | 0 refills | Status: DC

## 2017-05-21 NOTE — Clinical Social Work Note (Signed)
Clinical Social Work Assessment  Patient Details  Name: Peter [name] MRN: [id number] Date of Birth: 08/10/1980  Date of referral:  05/21/17               Reason for consult:  Substance Use/ETOH Abuse                Permission sought to share information with:    Permission granted to share information::     Name::        Agency::     Relationship::     Contact Information:     Housing/Transportation Living arrangements for the past 2 months:  Single Family Home Source of Information:  Patient Patient Interpreter Needed:  None Criminal Activity/Legal Involvement Pertinent to Current Situation/Hospitalization:  No - Comment as needed Significant Relationships:  Parents Lives with:  Parents Do you feel safe going back to the place where you live?  Yes Need for family participation in patient care:  No (Coment)  Care giving concerns: Patient from home. Consult for current substance use.   Social Worker assessment / plan: CSW met with patient at bedside for assessment. Patient reports living in [place] with his mother. Patient endorsed alcohol use and stated that he has had a hard time coping lately due to the loss of his child's mother and another close relative. CSW expressed empathy for patient's losses. CSW assessed patient's engagement in substance use treatment. Patient reported he was previously engaged at a methadone clinic and still sometimes sees his counselor at that clinic. Patient indicated he plans to go meet with his old counselor as well as get involved with his AA group again. Patient also accepted list of outpatient and inpatient substance use treatment resources from CSW. CSW signing off, as no additional needs identified and patient discharging today.  Employment status:  Other (Comment)(not discussed) Insurance information:  Self Pay (Medicaid Pending) PT Recommendations:  Not assessed at this time Information / Referral to community resources:  Residential  Substance Abuse Treatment Options, Outpatient Substance Abuse Treatment Options  Patient/Family's Response to care: Patient appreciative of care and resources.  Patient/Family's Understanding of and Emotional Response to Diagnosis, Current Treatment, and Prognosis: Patient with understanding of his condition. Patient articulated plans to re-engage with substance use treatment and other resources in the community.  Emotional Assessment Appearance:  Appears stated age Attitude/Demeanor/Rapport:  Engaged Affect (typically observed):  Calm, Pleasant Orientation:  Oriented to Self, Oriented to Place, Oriented to  Time, Oriented to Situation Alcohol / Substance use:  Alcohol Use, Illicit Drugs Psych involvement (Current and /or in the community):  No (Comment)  Discharge Needs  Concerns to be addressed:  Substance Abuse Concerns Readmission within the last 30 days:  No Current discharge risk:  Substance Abuse Barriers to Discharge:  No Barriers Identified   Estanislado Emms, LCSW 05/21/2017, 2:39 PM

## 2017-05-21 NOTE — Plan of Care (Signed)
  Problem: Clinical Measurements: Goal: Ability to maintain clinical measurements within normal limits will improve Outcome: Progressing   Problem: Clinical Measurements: Goal: Diagnostic test results will improve Outcome: Progressing   Problem: Clinical Measurements: Goal: Cardiovascular complication will be avoided Outcome: Progressing   Problem: Pain Managment: Goal: General experience of comfort will improve Outcome: Progressing   

## 2017-05-21 NOTE — Progress Notes (Signed)
Discharge to home. Patient no complaints of any pain or discomfort upon discharge. D/c instructions and follow  Up appointments discussed with patient, verbalized understanding.

## 2017-05-25 NOTE — Discharge Summary (Signed)
[facility] Discharge Summary   Patient: Peter [name] ZOX:[id number]   PCP: Massie MaroonHollis, Lachina M, FNP DOB: 22-Jan-1981   Date of admission: 05/20/2017   Date of discharge: 05/21/2017     Discharge Diagnoses:  Active Problems:   Chest pain   Admitted From: home Disposition:  home  Recommendations for Outpatient Follow-up:  1. Please follow up with PCP in 1 week   Follow-up Information    Massie MaroonHollis, Lachina M, FNP. Go on 05/31/2017.   Specialty:  Family Medicine Why:  @3 :40pm Contact information: [address] [place] [address] [phone]          Diet recommendation: cardiac diet  Activity: The patient is advised to gradually reintroduce usual activities.  Discharge Condition: good  Code Status: full code  History of present illness: As per the H and P dictated on admission, "this is a 37 year old man with medical problems including prior opioid dependence, ongoing alcohol abuse, and hypertension presenting with chest pain and alcohol withdrawal/cessation support.  He reports acute onset of left-sided chest pain, occurred at noon watching television, sharp, nonradiating. Reports it is intermittent, lasts for seconds. Associated with shortness of breath at times epigastric pain. He reports drinking alcohol on a daily basis, reports checking fifth of whiskey daily. No history of seizures.  He reports formerly being seen at the methadone clinic, however he reports no longer being on methadone. 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 He reports having some abdominal pain currently that is consistent without pain.  Current other associated symptoms include a sense of restlessness."  Hospital Course:  Summary of his active problems in the hospital is as following. Chest pain Non cardiac Likely GI from gastrits Troponin now negative x 3. EKG with non-specific changes. Suspect somehow relate to anxiety associated with  ETOH withdrawal. PPI twice a day  ETOH withdrawal Longstanding hx of ETOH abuse, last ETOH intake was 8a on 05/20/2017 per his report.   counseling was provided with community resources to ensure optimization. Continue folic acid +thiamine.  -HTN: continue CCB and clonidine; hold ARB for now; also reports he it taking HCTZ 25 mg po daily - hold for now; -Hypomagnesemia: 1.5 on admission, IV mg given -Anxiety / depression: continue venlafaxine -Hx of substance abuse: f/u UDS -Epigastric pain: normal lipase argues against pancreatitis, more likely ETOH related gastritis, start PPI  All other chronic medical condition were stable during the hospitalization.  Patient was ambulatory without any assistance. On the day of the discharge the patient's vitals were stable, and no other acute medical condition were reported by patient. the patient was felt safe to be discharge at home with family.  Procedures and Results:  none   Consultations:  none  DISCHARGE MEDICATION: Allergies as of 05/21/2017   No Known Allergies     Medication List    STOP taking these medications   methocarbamol 500 MG tablet Commonly known as:  ROBAXIN     TAKE these medications   amLODipine 5 MG tablet Commonly known as:  NORVASC Take 1 tablet (5 mg total) by mouth daily.   cloNIDine 0.2 MG tablet Commonly known as:  CATAPRES Take 1 tablet (0.2 mg total) by mouth 3 (three) times daily.   folic acid 1 MG tablet Commonly known as:  FOLVITE Take 1 tablet (1 mg total) by mouth daily.   hydrOXYzine 25 MG tablet Commonly known as:  ATARAX/VISTARIL Take 1-2 tablets (25-50 mg total) by mouth every 6 (  six) hours as needed for anxiety.   losartan 50 MG tablet Commonly known as:  COZAAR Take 2 tablets (100 mg total) by mouth daily.   nicotine 14 mg/24hr patch Commonly known as:  NICODERM CQ - dosed in mg/24 hours Place 1 patch (14 mg total) onto the skin daily.   ondansetron 4 MG disintegrating  tablet Commonly known as:  ZOFRAN ODT Take 1 tablet (4 mg total) by mouth every 8 (eight) hours as needed for nausea or vomiting.   OVER THE COUNTER MEDICATION Take 4 capsules by mouth See admin instructions. Juice plus -  Take 2 juice capsules and 2 vegetable capsules every morning   pantoprazole 40 MG tablet Commonly known as:  PROTONIX Take 1 tablet (40 mg total) by mouth 2 (two) times daily before a meal.   ranitidine 150 MG capsule Commonly known as:  ZANTAC Take 1 capsule (150 mg total) by mouth 2 (two) times daily. What changed:    when to take this  reasons to take this   thiamine 100 MG tablet Commonly known as:  VITAMIN B-1 Take 1 tablet (100 mg total) by mouth daily.   venlafaxine XR 37.5 MG 24 hr capsule Commonly known as:  EFFEXOR-XR Take 1 capsule (37.5 mg total) by mouth daily with breakfast.      No Known Allergies Discharge Instructions    Diet - low sodium heart healthy   Complete by:  As directed    Discharge instructions   Complete by:  As directed    It is important that you read following instructions as well as go over your medication list with RN to help you understand your care after this hospitalization.  Discharge Instructions: Please follow-up with PCP in one week  Please request your primary care physician to go over all Hospital Tests and Procedure/Radiological results at the follow up,  Please get all Hospital records sent to your PCP by signing hospital release before you go home.   Do not take more than prescribed Pain, Sleep and Anxiety Medications. You were cared for by a hospitalist during your hospital stay. If you have any questions about your discharge medications or the care you received while you were in the hospital after you are discharged, you can call the unit and ask to speak with the hospitalist on call if the hospitalist that took care of you is not available.  Once you are discharged, your primary care physician will  handle any further medical issues. Please note that NO REFILLS for any discharge medications will be authorized once you are discharged, as it is imperative that you return to your primary care physician (or establish a relationship with a primary care physician if you do not have one) for your aftercare needs so that they can reassess your need for medications and monitor your lab values. You Must read complete instructions/literature along with all the possible adverse reactions/side effects for all the Medicines you take and that have been prescribed to you. Take any new Medicines after you have completely understood and accept all the possible adverse reactions/side effects. Wear Seat belts while driving. If you have smoked or chewed Tobacco in the last 2 yrs please stop smoking and/or stop any Recreational drug use.   Increase activity slowly   Complete by:  As directed      Discharge Exam: Filed Weights   05/20/17 1624 05/21/17 0602  Weight: 95.3 kg (210 lb) 93.4 kg (205 lb 14.4 oz)   Vitals:  05/21/17 0602 05/21/17 0946  BP: (!) 138/98 (!) 151/95  Pulse: 81 100  Resp: 16   Temp: 97.7 F (36.5 C)   SpO2: 96%    General: Appear in no distress, no Rash; Oral Mucosa moist. Cardiovascular: S1 and S2 Present, no Murmur, no JVD Respiratory: Bilateral Air entry present and Clear to Auscultation, no Crackles, no wheezes Abdomen: Bowel Sound present, Soft and no tenderness Extremities: no Pedal edema, no calf tenderness Neurology: Grossly no focal neuro deficit.  The results of significant diagnostics from this hospitalization (including imaging, microbiology, ancillary and laboratory) are listed below for reference.    Significant Diagnostic Studies: Dg Chest 2 View  Result Date: 05/20/2017 CLINICAL DATA:  Chest pain EXAM: CHEST - 2 VIEW COMPARISON:  02/14/2017 chest radiograph. FINDINGS: Stable cardiomediastinal silhouette with normal heart size. No pneumothorax. No pleural  effusion. Lungs appear clear, with no acute consolidative airspace disease and no pulmonary edema. IMPRESSION: No active cardiopulmonary disease. Electronically Signed   By: Delbert Phenix M.D.   On: 05/20/2017 17:01    Microbiology: No results found for this or any previous visit (from the past 240 hour(s)).   Labs: CBC: Recent Labs  Lab 05/20/17 1647 05/21/17 0202  WBC 13.1* 11.4*  HGB 17.7* 15.9  HCT 49.6 45.8  MCV 92.0 93.7  PLT 244 223   Basic Metabolic Panel: Recent Labs  Lab 05/20/17 1647 05/20/17 2019 05/21/17 0202  NA 132*  --  135  K 3.5  --  3.5  CL 97*  --  99*  CO2 20*  --  24  GLUCOSE 115*  --  137*  BUN 10  --  9  CREATININE 1.09  --  1.16  CALCIUM 9.4  --  8.7*  MG  --  1.5*  --    Liver Function Tests: Recent Labs  Lab 05/20/17 2019 05/21/17 0202  AST 57* 50*  ALT 43 39  ALKPHOS 57 56  BILITOT 0.7 1.1  PROT 8.1 6.6  ALBUMIN 4.1 3.3*   Recent Labs  Lab 05/20/17 2019  LIPASE 42   No results for input(s): AMMONIA in the last 168 hours. Cardiac Enzymes: Recent Labs  Lab 05/20/17 2212 05/21/17 0757 05/21/17 1427  TROPONINI <0.03 <0.03 <0.03   BNP (last 3 results) No results for input(s): BNP in the last 8760 hours. CBG: No results for input(s): GLUCAP in the last 168 hours. Time spent: 35 minutes  Signed:  Lynden Oxford  [facility] 05/21/2017 , 2:25 PM

## 2017-05-31 ENCOUNTER — Ambulatory Visit: Payer: Self-pay | Admitting: Family Medicine

## 2017-06-01 ENCOUNTER — Encounter: Payer: Self-pay | Admitting: Family Medicine

## 2017-06-01 ENCOUNTER — Ambulatory Visit (INDEPENDENT_AMBULATORY_CARE_PROVIDER_SITE_OTHER): Payer: Self-pay | Admitting: Family Medicine

## 2017-06-01 VITALS — BP 138/92 | HR 97 | Temp 98.7°F | Resp 16 | Ht 71.0 in | Wt 215.0 lb

## 2017-06-01 DIAGNOSIS — F32A Depression, unspecified: Secondary | ICD-10-CM

## 2017-06-01 DIAGNOSIS — F17200 Nicotine dependence, unspecified, uncomplicated: Secondary | ICD-10-CM

## 2017-06-01 DIAGNOSIS — F1021 Alcohol dependence, in remission: Secondary | ICD-10-CM

## 2017-06-01 DIAGNOSIS — F411 Generalized anxiety disorder: Secondary | ICD-10-CM

## 2017-06-01 DIAGNOSIS — F329 Major depressive disorder, single episode, unspecified: Secondary | ICD-10-CM

## 2017-06-01 DIAGNOSIS — I10 Essential (primary) hypertension: Secondary | ICD-10-CM

## 2017-06-01 LAB — POCT URINALYSIS DIPSTICK
Bilirubin, UA: NEGATIVE
Blood, UA: NEGATIVE
Glucose, UA: NEGATIVE
Ketones, UA: NEGATIVE
Leukocytes, UA: NEGATIVE
Nitrite, UA: NEGATIVE
PH UA: 7 (ref 5.0–8.0)
PROTEIN UA: NEGATIVE
Spec Grav, UA: 1.02 (ref 1.010–1.025)
UROBILINOGEN UA: 0.2 U/dL

## 2017-06-01 MED ORDER — LOSARTAN POTASSIUM 50 MG PO TABS: 50.0000 mg | ORAL_TABLET | Freq: Every day | ORAL | 0 refills | Status: DC

## 2017-06-01 MED ORDER — CLONIDINE HCL 0.2 MG PO TABS: 0.2000 mg | ORAL_TABLET | Freq: Three times a day (TID) | ORAL | 5 refills | Status: DC

## 2017-06-01 MED ORDER — VENLAFAXINE HCL ER 37.5 MG PO CP24: 37.5000 mg | ORAL_CAPSULE | Freq: Every day | ORAL | 5 refills | Status: DC

## 2017-06-01 MED ORDER — HYDROXYZINE HCL 25 MG PO TABS: 25.0000 mg | ORAL_TABLET | Freq: Four times a day (QID) | ORAL | 1 refills | Status: DC | PRN

## 2017-06-01 MED ORDER — LOSARTAN POTASSIUM 50 MG PO TABS
100.0000 mg | ORAL_TABLET | Freq: Every day | ORAL | 0 refills | Status: DC
Start: 2017-06-01 — End: 2017-06-01

## 2017-06-01 NOTE — Progress Notes (Signed)
Subjective:    Patient ID: Peter [name], male    DOB: 10-30-1980, 37 y.o.   MRN: [id number]  HPI Tyreke Kaeser, a 37 year old male that presents for a follow up of hypertension. Patient also has a history of alcohol abuse and methadone dependence. He says that he no longer takes methadone. It has been 2 weeks since last drink. Patient was recently admitted to inpatient services for alcoholic hepatitis and withdrawal.   Patient is here for a follow up of hypertension. He has been taking Losartan and Clonidine consistently. Amlodipine was added to medication regimen. Patient says that he has been unable to pick up medication.   He  denies chest pain, dyspnea, irregular heart beat, orthopnea, palpitations, syncope and tachypnea.  Cardiovascular risk factors include: sedentary lifestyle and smoking/ tobacco exposure.     Past Medical History:  Diagnosis Date  . Alcoholism (HCC)   . Hypertension   . Substance abuse (HCC)    Social History   Socioeconomic History  . Marital status: Single    Spouse name: Not on file  . Number of children: Not on file  . Years of education: Not on file  . Highest education level: Not on file  Occupational History  . Not on file  Social Needs  . Financial resource strain: Not on file  . Food insecurity:    Worry: Not on file    Inability: Not on file  . Transportation needs:    Medical: Not on file    Non-medical: Not on file  Tobacco Use  . Smoking status: Current Every Day Smoker    Packs/day: 0.50    Types: Cigarettes  . Smokeless tobacco: Never Used  Substance and Sexual Activity  . Alcohol use: Yes    Comment: fifth a day whiskey. nothing in 3 days (10/13/2016)  . Drug use: No  . Sexual activity: Yes    Birth control/protection: Condom  Lifestyle  . Physical activity:    Days per week: Not on file    Minutes per session: Not on file  . Stress: Not on file  Relationships  . Social connections:    Talks on phone: Not on file    Gets together: Not on file    Attends religious service: Not on file    Active member of club or organization: Not on file    Attends meetings of clubs or organizations: Not on file    Relationship status: Not on file  . Intimate partner violence:    Fear of current or ex partner: Not on file    Emotionally abused: Not on file    Physically abused: Not on file    Forced sexual activity: Not on file  Other Topics Concern  . Not on file  Social History Narrative  . Not on file   Immunization History  Administered Date(s) Administered  . Influenza,inj,Quad PF,6+ Mos 02/16/2017  . Pneumococcal Polysaccharide-23 02/16/2017  . Tdap 10/31/2011   No Known Allergies   Review of Systems  Constitutional: Negative for fatigue.  Eyes: Negative for photophobia, pain and redness.  Respiratory: Negative.   Cardiovascular: Negative for leg swelling.  Gastrointestinal: Negative.   Endocrine: Negative for polydipsia, polyphagia and polyuria.  Genitourinary: Negative.   Musculoskeletal: Negative.   Skin: Negative.   Neurological: Negative.   Hematological: Negative.   Psychiatric/Behavioral: Negative for behavioral problems and sleep disturbance.       Objective:   Physical Exam  Constitutional: He is oriented to person,  place, and time.  HENT:  Head: Normocephalic and atraumatic.  Right Ear: External ear normal.  Left Ear: External ear normal.  Nose: Nose normal.  Mouth/Throat: Oropharynx is clear and moist.  Eyes: Pupils are equal, round, and reactive to light. Conjunctivae and EOM are normal.  Neck: Normal range of motion. Neck supple.  Pulmonary/Chest: Effort normal and breath sounds normal.  Abdominal: Soft. Bowel sounds are normal.  Musculoskeletal: Normal range of motion.  Neurological: He is alert and oriented to person, place, and time. He displays tremor. He displays no atrophy. No cranial nerve deficit. He exhibits normal muscle tone. Coordination and gait normal.  Fine  tremor   Skin: Skin is warm and dry.  Psychiatric: His behavior is normal. Judgment and thought content normal. His speech is not rapid and/or pressured. He is not agitated. He does not exhibit a depressed mood. He expresses no homicidal and no suicidal ideation.    BP (!) 138/92 (BP Location: Right Arm, Patient Position: Sitting, Cuff Size: Large) Comment: manually  Pulse 97   Temp 98.7 F (37.1 C) (Oral)   Resp 16   Ht 5\' 11"  (1.803 m)   Wt 215 lb (97.5 kg)   SpO2 97%   BMI 29.99 kg/m   Assessment & Plan:  1. Essential hypertension Blood pressure is at goal on current medication regimen.  Continue medication, monitor blood pressure at home. Continue DASH diet. Reminded to go to the ER if any CP, SOB, nausea, dizziness, severe HA, changes vision/speech, left arm numbness and tingling and jaw pain.  - Urinalysis Dipstick - cloNIDine (CATAPRES) 0.2 MG tablet; Take 1 tablet (0.2 mg total) by mouth 3 (three) times daily.  Dispense: 90 tablet; Refill: 5 - Comprehensive metabolic panel - hydrOXYzine (ATARAX/VISTARIL) 25 MG tablet; Take 1-2 tablets (25-50 mg total) by mouth every 6 (six) hours as needed for anxiety.  Dispense: 30 tablet; Refill: 1 - losartan (COZAAR) 50 MG tablet; Take 1 tablet (50 mg total) by mouth daily.  Dispense: 30 tablet; Refill: 0  2. Depression, unspecified depression type - venlafaxine XR (EFFEXOR-XR) 37.5 MG 24 hr capsule; Take 1 capsule (37.5 mg total) by mouth daily with breakfast.  Dispense: 30 capsule; Refill: 5  3. Alcohol dependence in remission [facility](HCC) Discussed the importance of refraining from alcohol use. Patient says that he is consistently attending meetings.   4. Generalized anxiety disorder - venlafaxine XR (EFFEXOR-XR) 37.5 MG 24 hr capsule; Take 1 capsule (37.5 mg total) by mouth daily with breakfast.  Dispense: 30 capsule; Refill: 5  5. Tobacco dependence Smoking cessation instruction/counseling given:  counseled patient on the dangers of  tobacco use, advised patient to stop smoking, and reviewed strategies to maximize success    RTC: 3 months for hypertension  Nolon NationsLachina Moore Shashwat Cleary  MSN, FNP-C Patient Care [facility] [address]  [place], [place] [address] [phone]

## 2017-06-02 LAB — COMPREHENSIVE METABOLIC PANEL
ALT: 39 IU/L (ref 0–44)
AST: 31 IU/L (ref 0–40)
Albumin/Globulin Ratio: 1.6 (ref 1.2–2.2)
Albumin: 4.7 g/dL (ref 3.5–5.5)
Alkaline Phosphatase: 59 IU/L (ref 39–117)
BUN/Creatinine Ratio: 12 (ref 9–20)
BUN: 13 mg/dL (ref 6–20)
Bilirubin Total: <0.2 mg/dL (ref 0.0–1.2)
CALCIUM: 10.2 mg/dL (ref 8.7–10.2)
CO2: 26 mmol/L (ref 20–29)
CREATININE: 1.08 mg/dL (ref 0.76–1.27)
Chloride: 99 mmol/L (ref 96–106)
GFR calc Af Amer: 102 mL/min/{1.73_m2} (ref >59)
GFR, EST NON AFRICAN AMERICAN: 88 mL/min/{1.73_m2} (ref >59)
GLOBULIN, TOTAL: 2.9 g/dL (ref 1.5–4.5)
Glucose: 89 mg/dL (ref 65–99)
Potassium: 4.5 mmol/L (ref 3.5–5.2)
Sodium: 140 mmol/L (ref 134–144)
Total Protein: 7.6 g/dL (ref 6.0–8.5)

## 2017-06-23 ENCOUNTER — Other Ambulatory Visit: Payer: Self-pay | Admitting: Family Medicine

## 2017-06-23 DIAGNOSIS — I10 Essential (primary) hypertension: Secondary | ICD-10-CM

## 2017-07-10 ENCOUNTER — Other Ambulatory Visit: Payer: Self-pay

## 2017-07-10 MED ORDER — AMLODIPINE BESYLATE 5 MG PO TABS: 5.0000 mg | ORAL_TABLET | Freq: Every day | ORAL | 0 refills | Status: DC

## 2017-07-10 NOTE — Telephone Encounter (Signed)
Medication refilled

## 2017-07-12 IMAGING — US US ABDOMEN LIMITED
1 series · 14 of 25 positions shown · non-contrast
Comparison: None.

CLINICAL DATA: Right upper quadrant pain.  History of alcoholism.

EXAM:
ULTRASOUND ABDOMEN LIMITED RIGHT UPPER QUADRANT

[Series 1: us abdomen limited · 0.24mm/px · 14 of 62 slices shown]
[im 1/62]
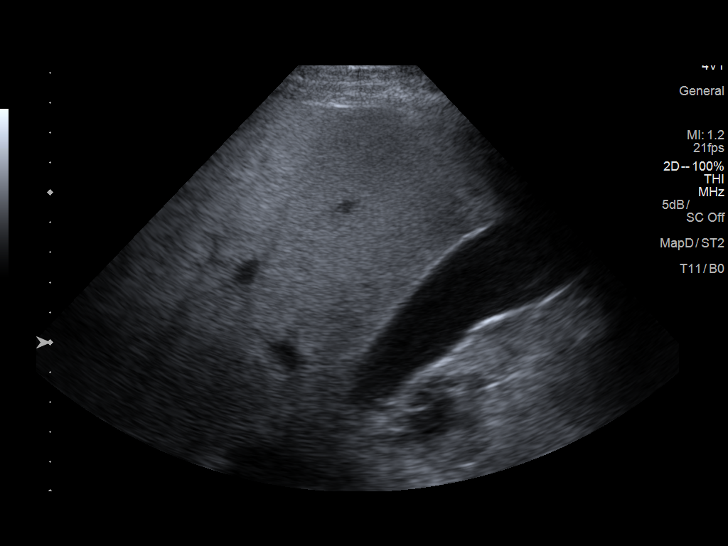
[im 6/62]
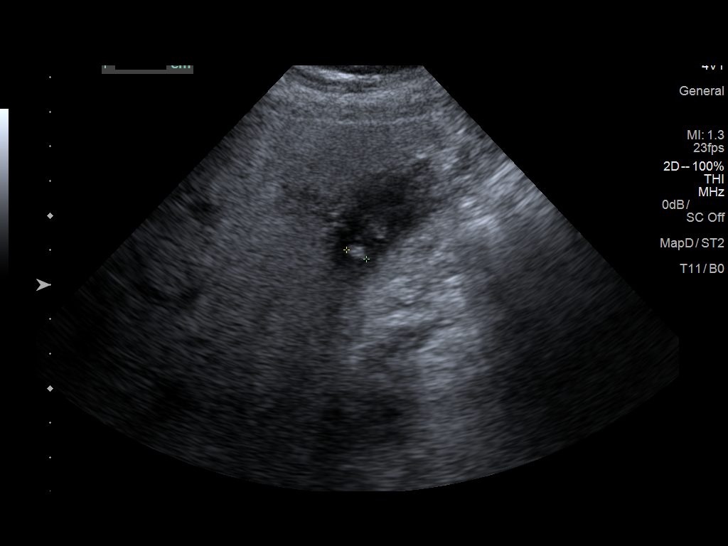
[im 11/62]
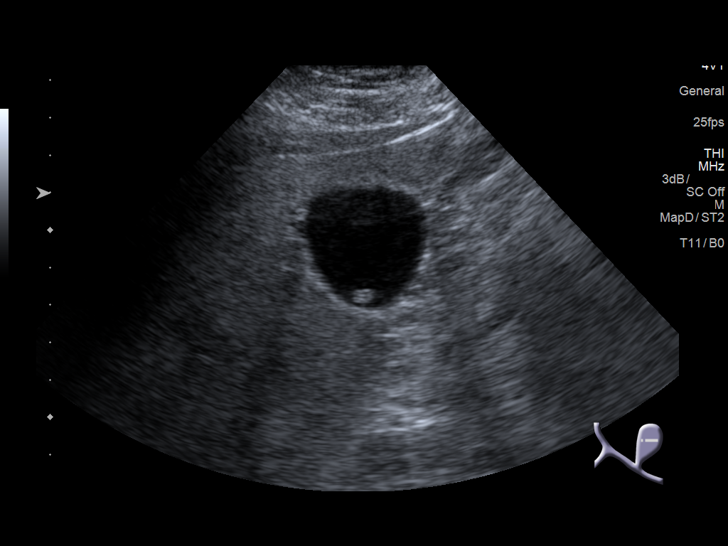
[im 16/62]
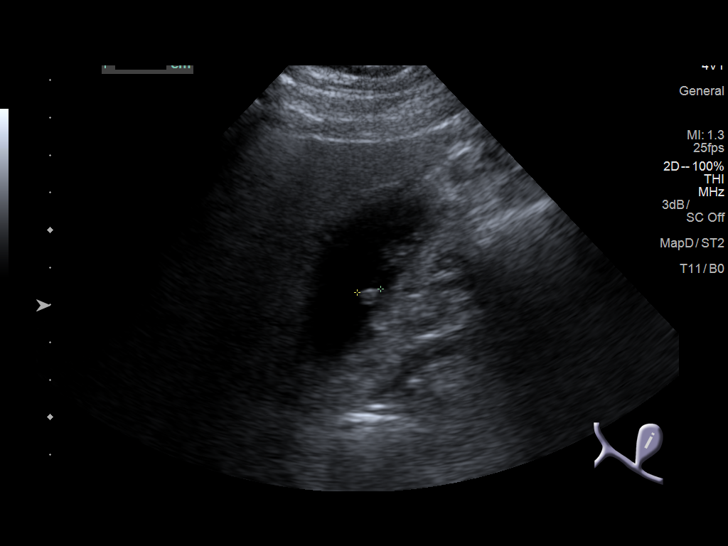
[im 21/62]
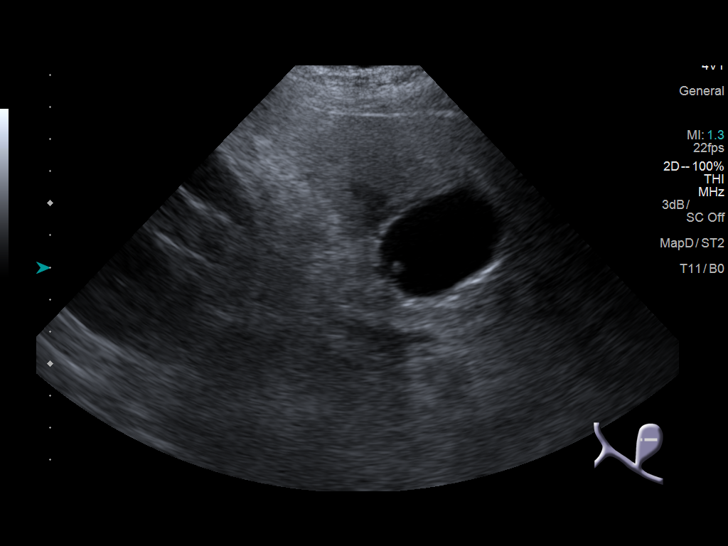
[im 23/62]
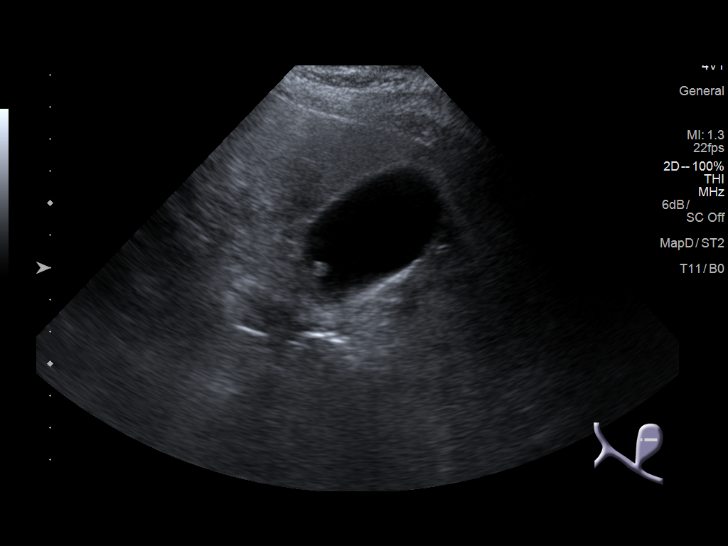
[im 28/62]
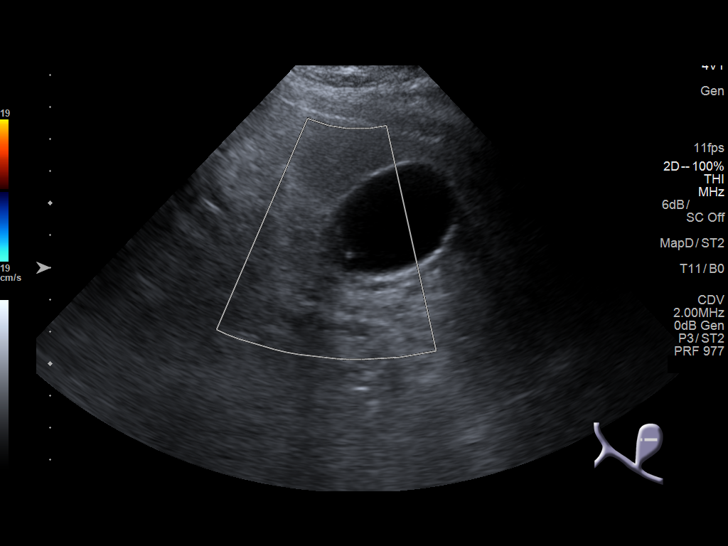
[im 34/62]
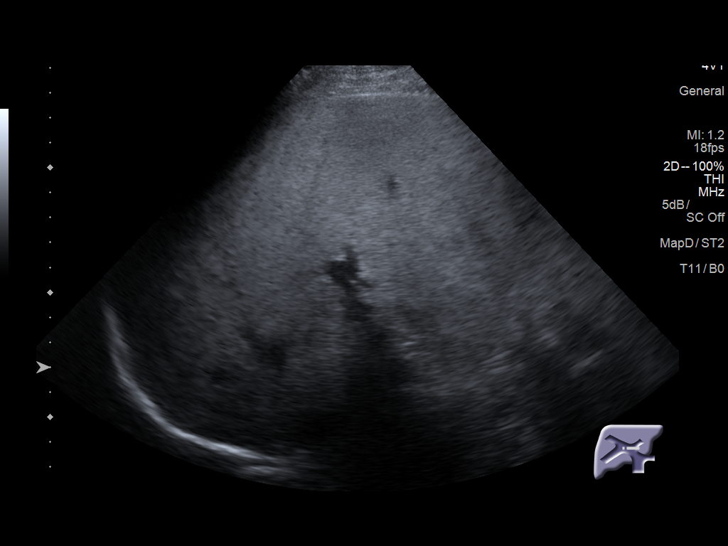
[im 39/62]
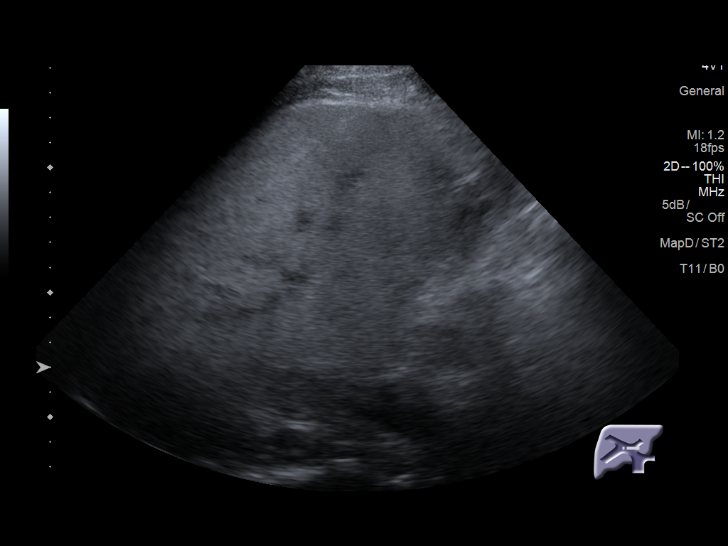
[im 41/62]
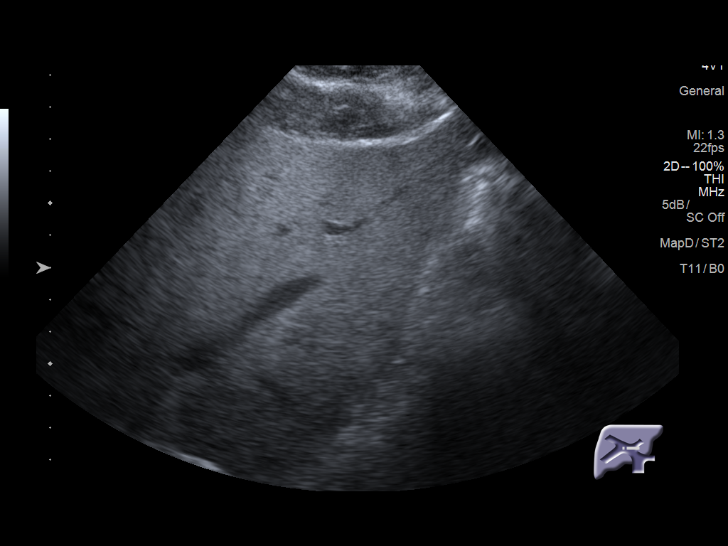
[im 46/62]
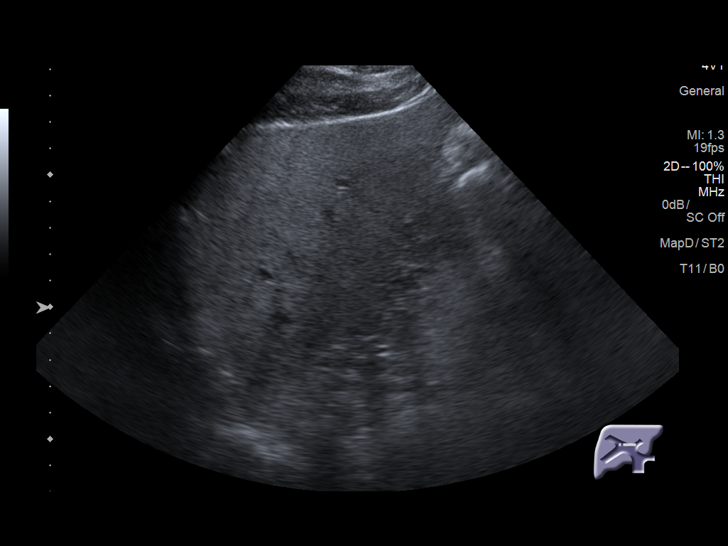
[im 51/62]
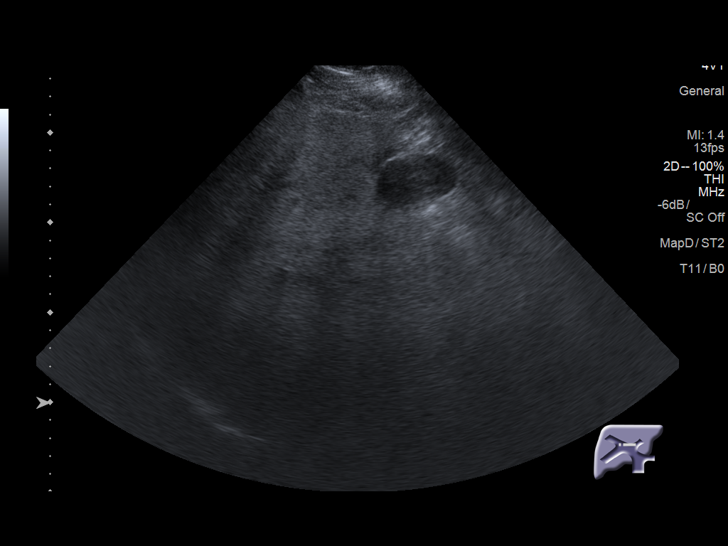
[im 56/62]
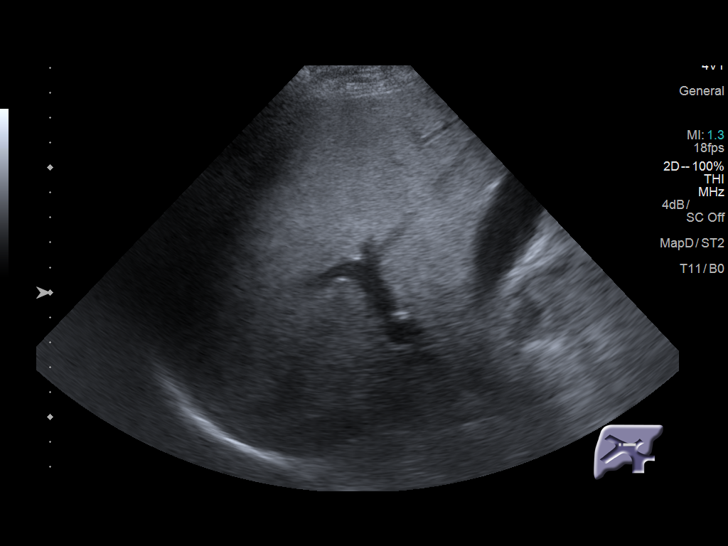
[im 62/62]
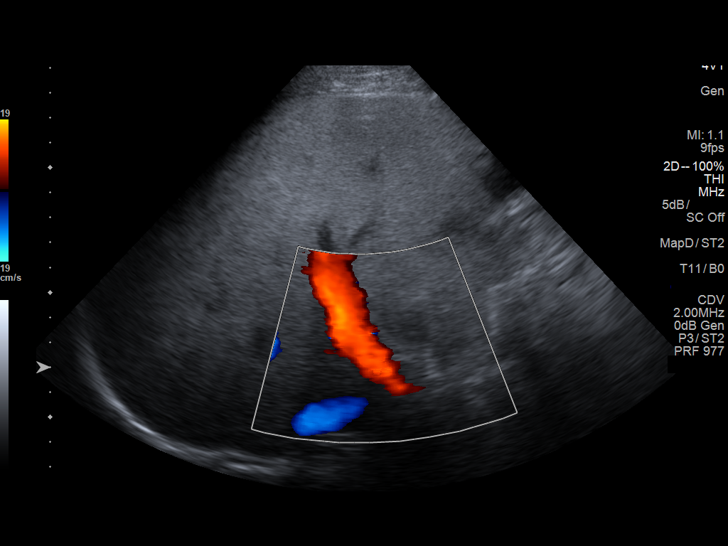

[14 of 25 positions shown; findings below may reference images not displayed]

FINDINGS: Gallbladder:

No gallstones or wall thickening visualized. Gallbladder polyps are
noted, largest measures 0.6 cm. No sonographic Murphy sign noted by
sonographer.

Common bile duct:

Diameter: 2.8 mm

Liver:

No focal lesion identified. Diffuse increased echotexture of the
liver is noted. Portal vein is patent on color Doppler imaging with
normal direction of blood flow towards the liver.
IMPRESSION: Diffuse increased echotexture of the liver is identified,
nonspecific but can be seen in fatty infiltration of liver.

Gallbladder polyps.  No sonographic evidence of acute cholecystitis.

## 2017-08-03 ENCOUNTER — Other Ambulatory Visit: Payer: Self-pay | Admitting: Family Medicine

## 2017-08-03 DIAGNOSIS — I10 Essential (primary) hypertension: Secondary | ICD-10-CM

## 2017-08-20 ENCOUNTER — Other Ambulatory Visit: Payer: Self-pay | Admitting: Family Medicine

## 2017-08-31 ENCOUNTER — Ambulatory Visit (INDEPENDENT_AMBULATORY_CARE_PROVIDER_SITE_OTHER): Payer: Self-pay | Admitting: Family Medicine

## 2017-08-31 ENCOUNTER — Encounter: Payer: Self-pay | Admitting: Family Medicine

## 2017-08-31 VITALS — BP 142/94 | HR 124 | Temp 98.6°F | Resp 16 | Ht 71.0 in | Wt 219.0 lb

## 2017-08-31 DIAGNOSIS — F32A Depression, unspecified: Secondary | ICD-10-CM

## 2017-08-31 DIAGNOSIS — N6459 Other signs and symptoms in breast: Secondary | ICD-10-CM

## 2017-08-31 DIAGNOSIS — R635 Abnormal weight gain: Secondary | ICD-10-CM

## 2017-08-31 DIAGNOSIS — I10 Essential (primary) hypertension: Secondary | ICD-10-CM

## 2017-08-31 DIAGNOSIS — F329 Major depressive disorder, single episode, unspecified: Secondary | ICD-10-CM

## 2017-08-31 DIAGNOSIS — E569 Vitamin deficiency, unspecified: Secondary | ICD-10-CM

## 2017-08-31 DIAGNOSIS — F411 Generalized anxiety disorder: Secondary | ICD-10-CM

## 2017-08-31 MED ORDER — HYDROXYZINE HCL 25 MG PO TABS: 25.0000 mg | ORAL_TABLET | Freq: Four times a day (QID) | ORAL | 1 refills | Status: DC | PRN

## 2017-08-31 MED ORDER — CLONIDINE HCL 0.2 MG PO TABS: 0.2000 mg | ORAL_TABLET | Freq: Three times a day (TID) | ORAL | 1 refills | Status: DC

## 2017-08-31 MED ORDER — BUSPIRONE HCL 7.5 MG PO TABS: 7.5000 mg | ORAL_TABLET | Freq: Two times a day (BID) | ORAL | 2 refills | Status: DC

## 2017-08-31 MED ORDER — VENLAFAXINE HCL ER 37.5 MG PO CP24: 37.5000 mg | ORAL_CAPSULE | Freq: Every day | ORAL | 1 refills | Status: DC

## 2017-08-31 MED ORDER — LOSARTAN POTASSIUM 50 MG PO TABS: 50.0000 mg | ORAL_TABLET | Freq: Every day | ORAL | 1 refills | Status: DC

## 2017-08-31 MED ORDER — AMLODIPINE BESYLATE 5 MG PO TABS: 5.0000 mg | ORAL_TABLET | Freq: Every day | ORAL | 1 refills | Status: DC

## 2017-08-31 MED ORDER — VENLAFAXINE HCL ER 75 MG PO CP24: 75.0000 mg | ORAL_CAPSULE | Freq: Every day | ORAL | 1 refills | Status: DC

## 2017-08-31 NOTE — Progress Notes (Signed)
Subjective   Peter [name] 37 y.o. male  [id number]  [id number]  06-Jul-1980    Chief Complaint  Patient presents with  . Hypertension    Patient presents for follow up on chronic conditions. Patient with a hx of HTN, MDD, GAD,  And weight gain. Patient states that he is doing well on his medications, needs refills. Patient states that he has noted swelling in the left breast. Pt states that there is hardened tissue surrounding the nipple. He states that he feels that there is fluid in the center. Patient denies nipple discharge or pain. States that other people comment about the uneven size of his breast. Patient states that the effexor has mildly improved his depression.  Patient was previously on BuSpar.  States that he was taken off because he was on methadone.  Patient now off methadone and would like to return to BuSpar.  He thinks that he was on 10 mg twice a day.  Patient continues to smoke about a pack a day.  Not interested in quitting at the present time.  Patient denies low-sodium diet and exercise.  Patient states that he has gained weight in the past few months.   Review of Systems  Constitutional: Negative.   HENT: Negative.   Respiratory: Negative.   Cardiovascular: Negative.   Gastrointestinal: Negative.   Musculoskeletal: Negative.   Skin: Negative.   Neurological: Negative.   Psychiatric/Behavioral: Positive for depression. Negative for suicidal ideas. The patient is nervous/anxious and has insomnia.     Objective   Physical Exam  Constitutional: He is oriented to person, place, and time. He appears well-developed and well-nourished. No distress.  HENT:  Head: Normocephalic and atraumatic.  Right Ear: External ear normal.  Left Ear: External ear normal.  Mouth/Throat: Oropharynx is clear and moist.  Eyes: Pupils are equal, round, and reactive to light. Conjunctivae and EOM are normal.  Neck: Normal range of motion. Neck supple.  Cardiovascular:  Regular rhythm, S1 normal, S2 normal and normal heart sounds. Tachycardia present.  Pulmonary/Chest: Effort normal and breath sounds normal. No respiratory distress. He has no wheezes. He has no rales.    Abdominal: Soft. Bowel sounds are normal.  Musculoskeletal: Normal range of motion.  Neurological: He is alert and oriented to person, place, and time.  Psychiatric: His speech is normal and behavior is normal. Judgment and thought content normal. His mood appears anxious. He is not actively hallucinating. Cognition and memory are normal. He is attentive.    BP (!) 142/94 (BP Location: Left Arm, Patient Position: Sitting, Cuff Size: Large) Comment: manually  Pulse (!) 124   Temp 98.6 F (37 C) (Oral)   Resp 16   Ht 5\' 11"  (1.803 m)   Wt 219 lb (99.3 kg)   SpO2 96%   BMI 30.54 kg/m   Assessment   Encounter Diagnoses  Name Primary?  . Essential hypertension Yes  . Generalized anxiety disorder   . Depression, unspecified depression type   . Vitamin deficiency   . Weight gain   . Abnormal breast exam      Plan  1. Essential hypertension Continue with current medications.  - cloNIDine (CATAPRES) 0.2 MG tablet; Take 1 tablet (0.2 mg total) by mouth 3 (three) times daily.  Dispense: 270 tablet; Refill: 1 - losartan (COZAAR) 50 MG tablet; Take 1 tablet (50 mg total) by mouth daily.  Dispense: 90 tablet; Refill: 1 - CBC With Differential - Comprehensive metabolic panel - Lipid Panel  2. Generalized  anxiety disorder Increase Effexor to 75mg  po Q day.  - venlafaxine XR (EFFEXOR-XR) 75 MG 24 hr capsule; Take 1 capsule (75 mg total) by mouth daily with breakfast.  Dispense: 90 capsule; Refill: 1  3. Depression, unspecified depression type Increase effexor to 75mg . Start buspar 7.5 BID.  - TSH - venlafaxine XR (EFFEXOR-XR) 75 MG 24 hr capsule; Take 1 capsule (75 mg total) by mouth daily with breakfast.  Dispense: 90 capsule; Refill: 1  4. Vitamin deficiency Patient states that  legs are restless at night.  - Vitamin B12 - Vitamin D, 25-hydroxy  5. Weight gain Check TSH   6. Abnormal breast exam U/s of breast ordered.  - US BREAST COMPLETE UNI LEFT INC AXILLA; Future    This note has been created with Education officer, environmental. Any transcriptional errors are unintentional.

## 2017-08-31 NOTE — Patient Instructions (Signed)

## 2017-09-01 LAB — COMPREHENSIVE METABOLIC PANEL
ALT: 87 IU/L — ABNORMAL HIGH (ref 0–44)
AST: 62 IU/L — ABNORMAL HIGH (ref 0–40)
Albumin/Globulin Ratio: 1.5 (ref 1.2–2.2)
Albumin: 4.9 g/dL (ref 3.5–5.5)
Alkaline Phosphatase: 73 IU/L (ref 39–117)
BUN/Creatinine Ratio: 12 (ref 9–20)
BUN: 14 mg/dL (ref 6–20)
Bilirubin Total: 0.3 mg/dL (ref 0.0–1.2)
CO2: 22 mmol/L (ref 20–29)
Calcium: 9.8 mg/dL (ref 8.7–10.2)
Chloride: 94 mmol/L — ABNORMAL LOW (ref 96–106)
Creatinine, Ser: 1.16 mg/dL (ref 0.76–1.27)
GFR calc Af Amer: 93 mL/min/{1.73_m2} (ref >59)
GFR calc non Af Amer: 81 mL/min/{1.73_m2} (ref >59)
Globulin, Total: 3.3 g/dL (ref 1.5–4.5)
Glucose: 131 mg/dL — ABNORMAL HIGH (ref 65–99)
Potassium: 4.1 mmol/L (ref 3.5–5.2)
Sodium: 133 mmol/L — ABNORMAL LOW (ref 134–144)
Total Protein: 8.2 g/dL (ref 6.0–8.5)

## 2017-09-01 LAB — CBC WITH DIFFERENTIAL
Basophils Absolute: 0 10*3/uL (ref 0.0–0.2)
Basos: 0 %
EOS (ABSOLUTE): 0.1 10*3/uL (ref 0.0–0.4)
Eos: 1 %
Hematocrit: 50.1 % (ref 37.5–51.0)
Hemoglobin: 17.4 g/dL (ref 13.0–17.7)
Immature Grans (Abs): 0 10*3/uL (ref 0.0–0.1)
Immature Granulocytes: 0 %
Lymphocytes Absolute: 2 10*3/uL (ref 0.7–3.1)
Lymphs: 18 %
MCH: 31.6 pg (ref 26.6–33.0)
MCHC: 34.7 g/dL (ref 31.5–35.7)
MCV: 91 fL (ref 79–97)
Monocytes Absolute: 0.6 10*3/uL (ref 0.1–0.9)
Monocytes: 6 %
Neutrophils Absolute: 8.4 10*3/uL — ABNORMAL HIGH (ref 1.4–7.0)
Neutrophils: 75 %
RBC: 5.5 x10E6/uL (ref 4.14–5.80)
RDW: 14.2 % (ref 12.3–15.4)
WBC: 11.2 10*3/uL — ABNORMAL HIGH (ref 3.4–10.8)

## 2017-09-01 LAB — LIPID PANEL
Chol/HDL Ratio: 7.1 ratio — ABNORMAL HIGH (ref 0.0–5.0)
Cholesterol, Total: 299 mg/dL — ABNORMAL HIGH (ref 100–199)
HDL: 42 mg/dL (ref >39)
Triglycerides: 596 mg/dL (ref 0–149)

## 2017-09-01 LAB — VITAMIN B12: Vitamin B-12: 277 pg/mL (ref 232–1245)

## 2017-09-01 LAB — VITAMIN D 25 HYDROXY (VIT D DEFICIENCY, FRACTURES): Vit D, 25-Hydroxy: 16.9 ng/mL — ABNORMAL LOW (ref 30.0–100.0)

## 2017-09-01 LAB — TSH: TSH: 0.953 u[IU]/mL (ref 0.450–4.500)

## 2017-09-03 ENCOUNTER — Other Ambulatory Visit: Payer: Self-pay | Admitting: Family Medicine

## 2017-09-03 ENCOUNTER — Telehealth: Payer: Self-pay

## 2017-09-03 DIAGNOSIS — K760 Fatty (change of) liver, not elsewhere classified: Secondary | ICD-10-CM

## 2017-09-03 MED ORDER — FENOFIBRATE 48 MG PO TABS: 48.0000 mg | ORAL_TABLET | Freq: Every day | ORAL | 11 refills | Status: DC

## 2017-09-03 NOTE — Progress Notes (Signed)
Fenofibrate sent for fatty liver.

## 2017-09-03 NOTE — Telephone Encounter (Signed)
-----   Message from Andre Douglas, FNP sent at 09/03/2017 11:10 AM EDT ----- Your liver function levels have increased. Your triglycerides were very high. I will send a medication to the pharmacy for this. Continue to refrain from using alcohol and tylenol. Weight loss through a low carb diet and exercise if recommended. 

## 2017-09-03 NOTE — Telephone Encounter (Signed)
Called, no answer. Left a message to call back. Thanks!  

## 2017-09-04 ENCOUNTER — Telehealth: Payer: Self-pay

## 2017-09-04 NOTE — Telephone Encounter (Signed)
-----   Message from Mike GipAndre Douglas, FNP sent at 09/03/2017 11:10 AM EDT ----- Your liver function levels have increased. Your triglycerides were very high. I will send a medication to the pharmacy for this. Continue to refrain from using alcohol and tylenol. Weight loss through a low carb diet and exercise if recommended.

## 2017-09-04 NOTE — Telephone Encounter (Signed)
Called, no answer. Left a message for patient to call back. Thanks!  

## 2017-09-05 NOTE — Telephone Encounter (Signed)
Called, no answer. Left a message for patient to call back. Thanks!  

## 2017-09-06 NOTE — Telephone Encounter (Signed)
Unable to contact by phone, will mail letter to patient. Thanks!

## 2017-09-07 ENCOUNTER — Other Ambulatory Visit: Payer: Self-pay | Admitting: Family Medicine

## 2017-09-07 DIAGNOSIS — N6459 Other signs and symptoms in breast: Secondary | ICD-10-CM

## 2017-11-28 ENCOUNTER — Emergency Department (HOSPITAL_COMMUNITY)
Admission: EM | Admit: 2017-11-28 | Discharge: 2017-11-29 | Disposition: A | Discharge status: Home / Self Care | Payer: Self-pay | Attending: Emergency Medicine | Admitting: Emergency Medicine

## 2017-11-28 ENCOUNTER — Other Ambulatory Visit: Payer: Self-pay

## 2017-11-28 ENCOUNTER — Emergency Department (HOSPITAL_COMMUNITY): Payer: Self-pay

## 2017-11-28 ENCOUNTER — Encounter (HOSPITAL_COMMUNITY): Payer: Self-pay | Admitting: Emergency Medicine

## 2017-11-28 DIAGNOSIS — K859 Acute pancreatitis without necrosis or infection, unspecified: Secondary | ICD-10-CM | POA: Insufficient documentation

## 2017-11-28 DIAGNOSIS — R0602 Shortness of breath: Secondary | ICD-10-CM | POA: Insufficient documentation

## 2017-11-28 DIAGNOSIS — R1011 Right upper quadrant pain: Secondary | ICD-10-CM

## 2017-11-28 LAB — COMPREHENSIVE METABOLIC PANEL
ALBUMIN: 3.2 g/dL — AB (ref 3.5–5.0)
ALT: 33 U/L (ref 0–44)
ANION GAP: 11 (ref 5–15)
AST: 24 U/L (ref 15–41)
Alkaline Phosphatase: 63 U/L (ref 38–126)
BUN: 10 mg/dL (ref 6–20)
CHLORIDE: 102 mmol/L (ref 98–111)
CO2: 23 mmol/L (ref 22–32)
Calcium: 9 mg/dL (ref 8.9–10.3)
Creatinine, Ser: 0.97 mg/dL (ref 0.61–1.24)
GFR calc Af Amer: >60 mL/min (ref >60)
GFR calc non Af Amer: >60 mL/min (ref >60)
GLUCOSE: 130 mg/dL — AB (ref 70–99)
POTASSIUM: 4 mmol/L (ref 3.5–5.1)
Sodium: 136 mmol/L (ref 135–145)
Total Bilirubin: 0.5 mg/dL (ref 0.3–1.2)
Total Protein: 7.3 g/dL (ref 6.5–8.1)

## 2017-11-28 LAB — I-STAT TROPONIN, ED
TROPONIN I, POC: 0 ng/mL (ref 0.00–0.08)
TROPONIN I, POC: 0.01 ng/mL (ref 0.00–0.08)

## 2017-11-28 LAB — CBC WITH DIFFERENTIAL/PLATELET
ABS IMMATURE GRANULOCYTES: 0.2 10*3/uL — AB (ref 0.00–0.07)
BASOS PCT: 1 %
Basophils Absolute: 0.1 10*3/uL (ref 0.0–0.1)
Eosinophils Absolute: 0.1 10*3/uL (ref 0.0–0.5)
Eosinophils Relative: 1 %
HCT: 44.1 % (ref 39.0–52.0)
HEMOGLOBIN: 14.9 g/dL (ref 13.0–17.0)
IMMATURE GRANULOCYTES: 2 %
LYMPHS PCT: 8 %
Lymphs Abs: 1.2 10*3/uL (ref 0.7–4.0)
MCH: 31.8 pg (ref 26.0–34.0)
MCHC: 33.8 g/dL (ref 30.0–36.0)
MCV: 94.2 fL (ref 80.0–100.0)
MONO ABS: 1.3 10*3/uL — AB (ref 0.1–1.0)
Monocytes Relative: 10 %
NEUTROS ABS: 10.9 10*3/uL — AB (ref 1.7–7.7)
NEUTROS PCT: 78 %
PLATELETS: 258 10*3/uL (ref 150–400)
RBC: 4.68 MIL/uL (ref 4.22–5.81)
RDW: 12.7 % (ref 11.5–15.5)
WBC: 13.7 10*3/uL — ABNORMAL HIGH (ref 4.0–10.5)
nRBC: 0 % (ref 0.0–0.2)

## 2017-11-28 LAB — BRAIN NATRIURETIC PEPTIDE: B Natriuretic Peptide: 36.4 pg/mL (ref 0.0–100.0)

## 2017-11-28 LAB — LIPASE, BLOOD: LIPASE: 52 U/L — AB (ref 11–51)

## 2017-11-28 LAB — D-DIMER, QUANTITATIVE: D-Dimer, Quant: 4.39 ug/mL-FEU — ABNORMAL HIGH (ref 0.00–0.50)

## 2017-11-28 IMAGING — CR DG CHEST 2V
2 series · 2 of 2 positions shown · non-contrast
Comparison: [DATE]

CLINICAL DATA: Onset of dyspnea and right upper quadrant pain.
Cough x1 week.

EXAM:
CHEST - 2 VIEW

[chest pa]
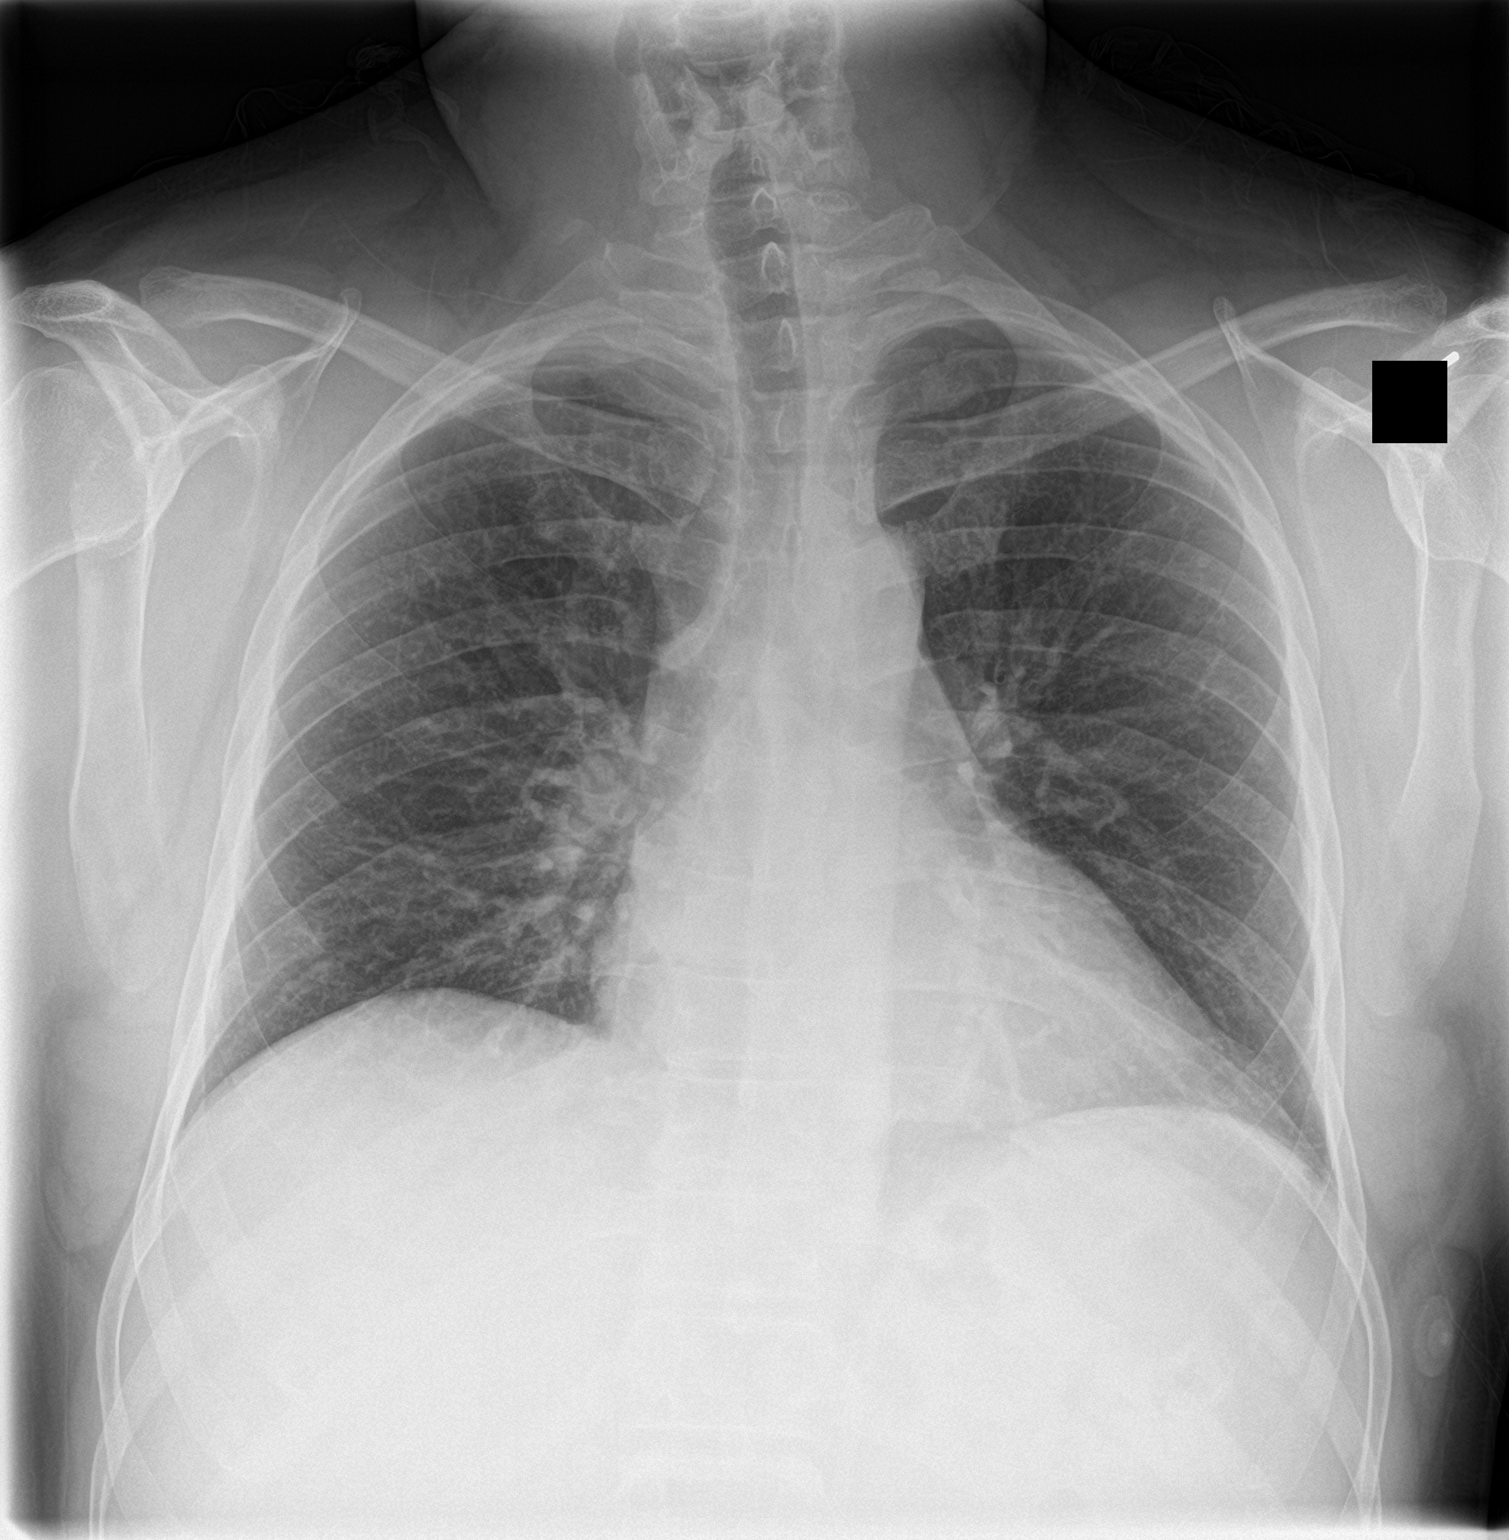

[chest lat]
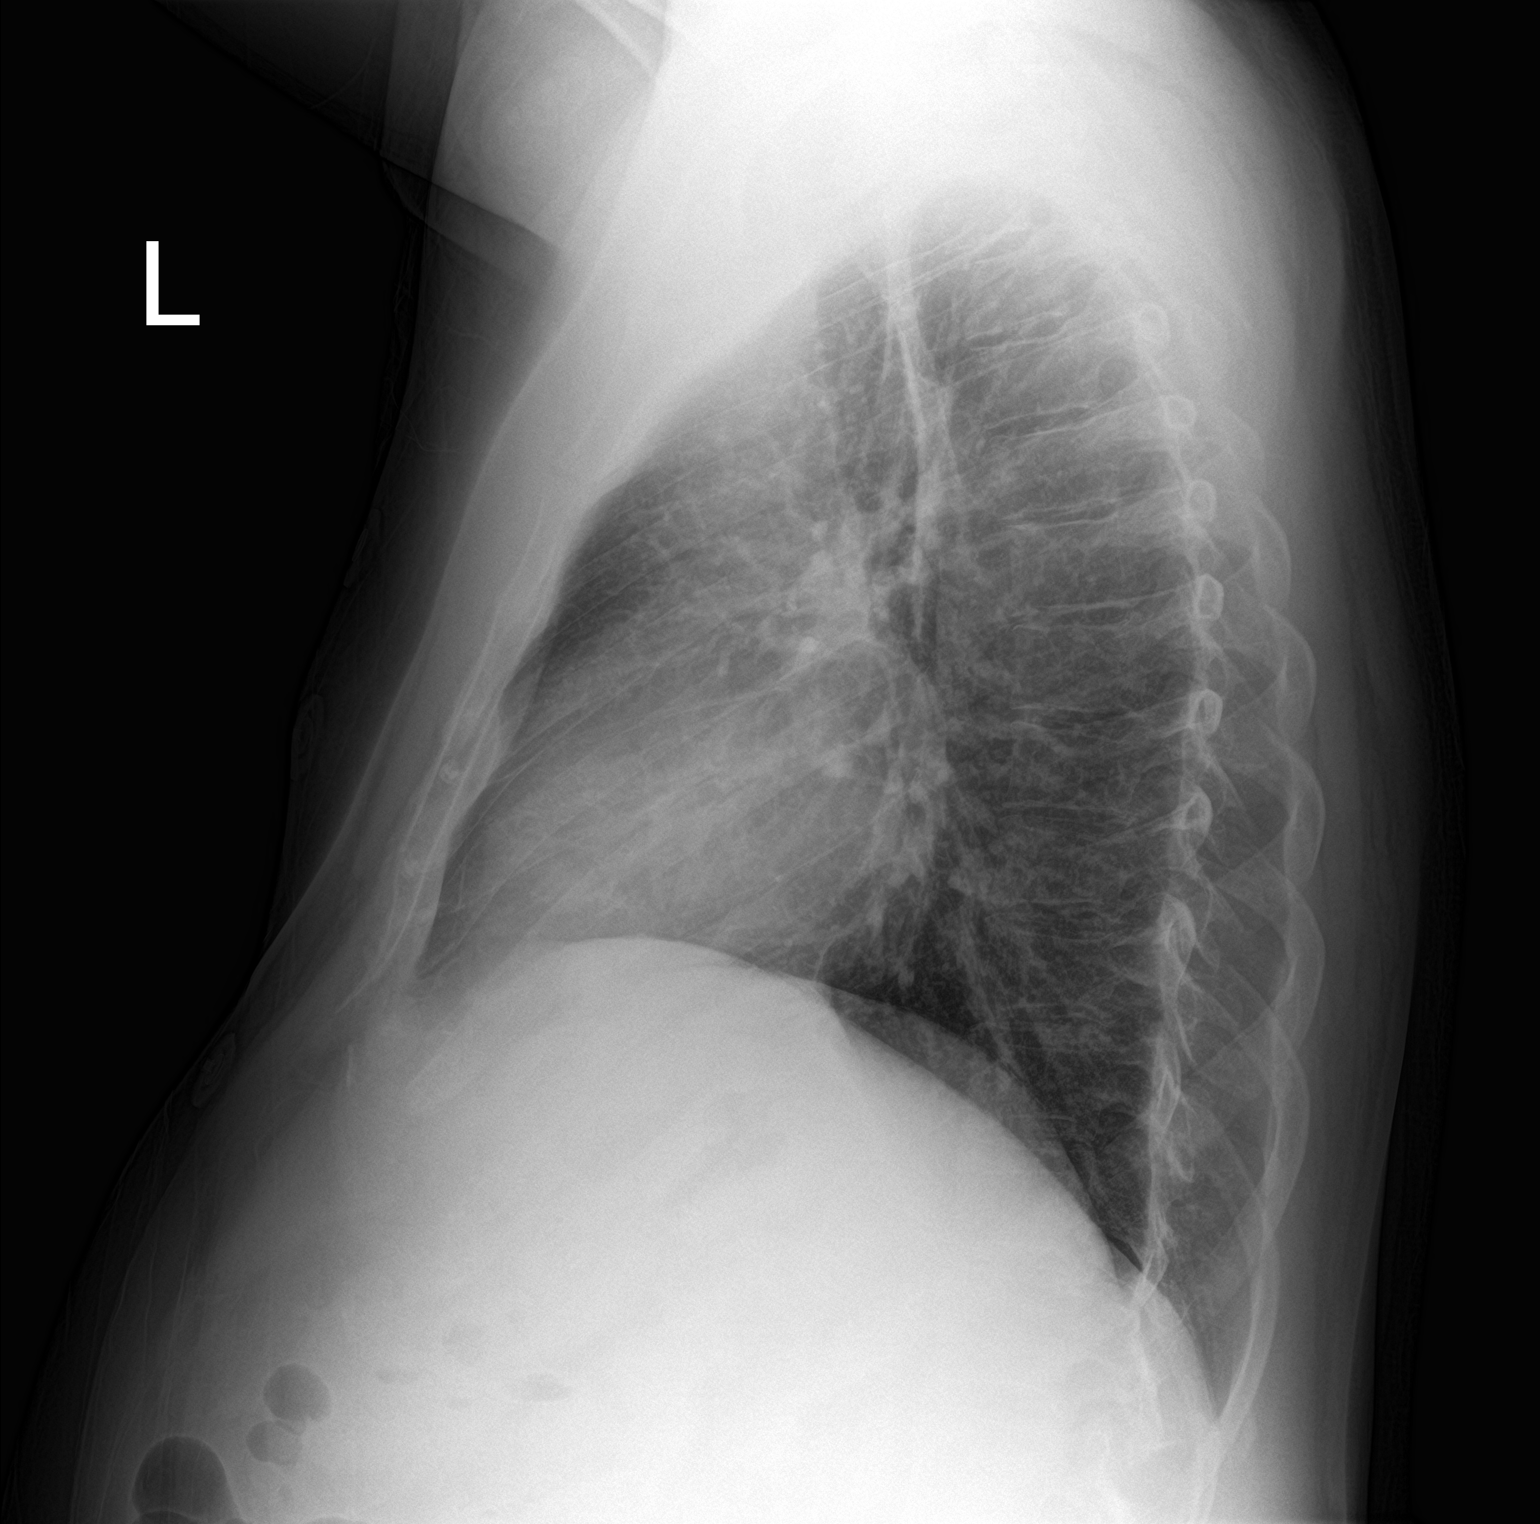

[2 of 2 positions shown; findings below may reference images not displayed]

FINDINGS: The heart size and mediastinal contours are within normal limits.
Both lungs are clear. The visualized skeletal structures are
unremarkable.
IMPRESSION: No active cardiopulmonary disease.

## 2017-11-28 IMAGING — CT CT ABD-PELV W/ CM
3 of 12 series · 11 of 46 positions shown, 17 images · IV contrast (APPLIED)
Comparison: CT abdomen and pelvis [DATE]

CLINICAL DATA: Shortness of breath beginning a month ago. Worsening
right upper quadrant abdominal pain.

EXAM:
CT ANGIOGRAPHY CHEST
CT ABDOMEN AND PELVIS WITH CONTRAST
TECHNIQUE: Multidetector CT imaging of the chest was performed using the
standard protocol during bolus administration of intravenous
contrast. Multiplanar CT image reconstructions and MIPs were
obtained to evaluate the vascular anatomy. Multidetector CT imaging
of the abdomen and pelvis was performed using the standard protocol
during bolus administration of intravenous contrast.
CONTRAST:  100mL [LU] IOPAMIDOL ([LU]) INJECTION 76%

[Series 7: thins · axial · 0.75mm/px · z∈[+1020,+1213]mm · 6 of 310 slices shown]
[im 20/310  soft-tissue]
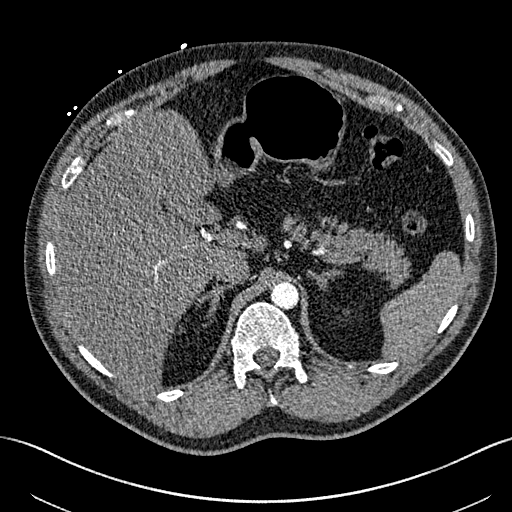
[im 58/310  soft-tissue]
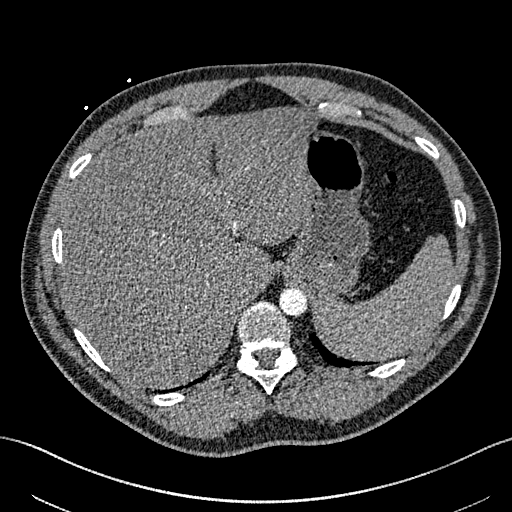
[im 97/310  soft-tissue]
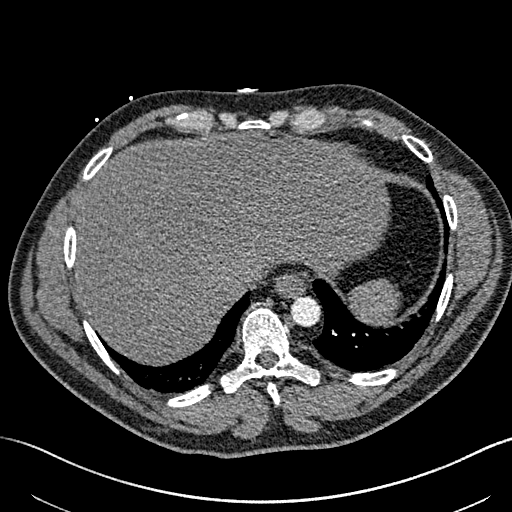
[im 136/310  soft-tissue]
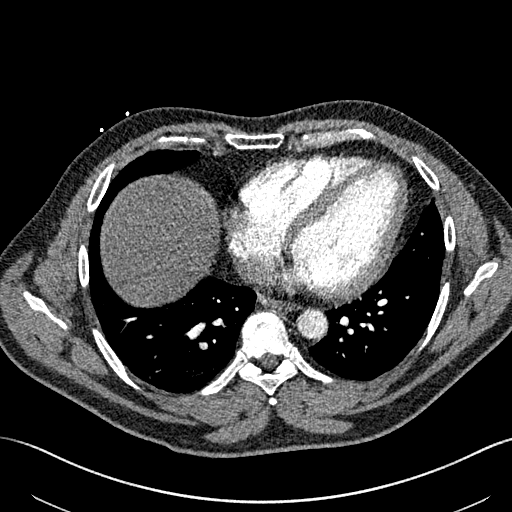
[im 174/310  soft-tissue]
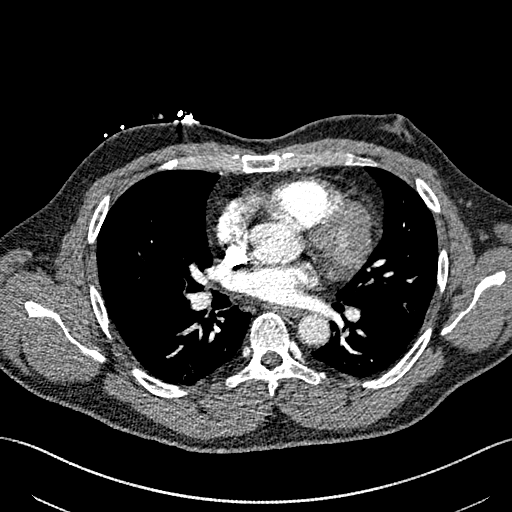
[im 213/310  soft-tissue]
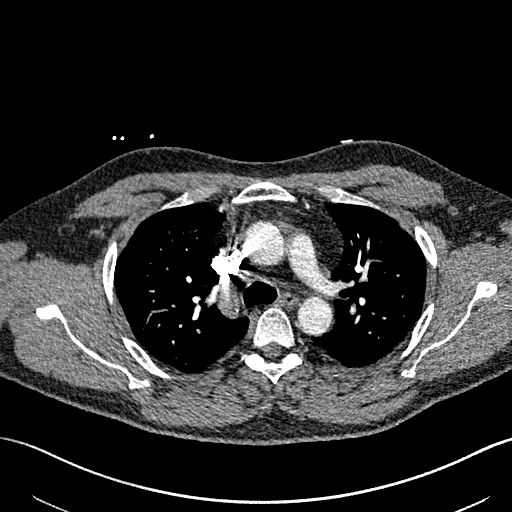

[Series 9: coronal mpr · coronal · 0.61mm/px · 1 of 126 slices shown, 2 images]
[im 63/126  soft-tissue]
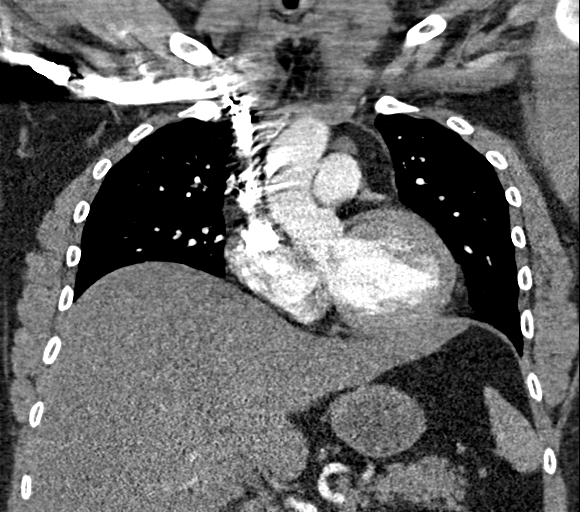
[im 63/126  bone]
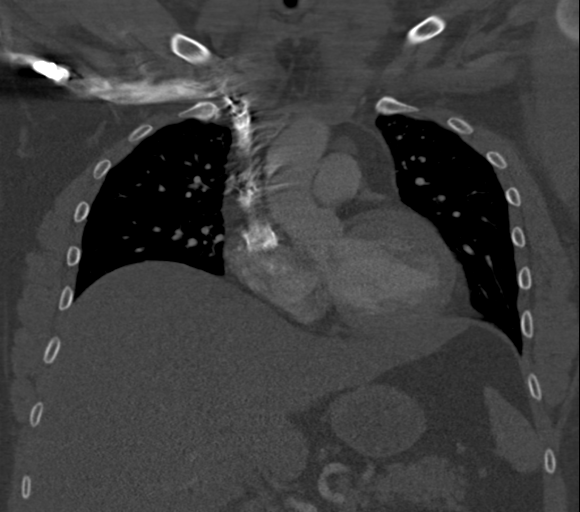

[Series 13: abd/ pelvis 5.0 i30f 2 · axial · 0.77mm/px · z∈[+721,+1051]mm · 4 of 112 slices shown, 9 images]
[im 23/112  soft-tissue]
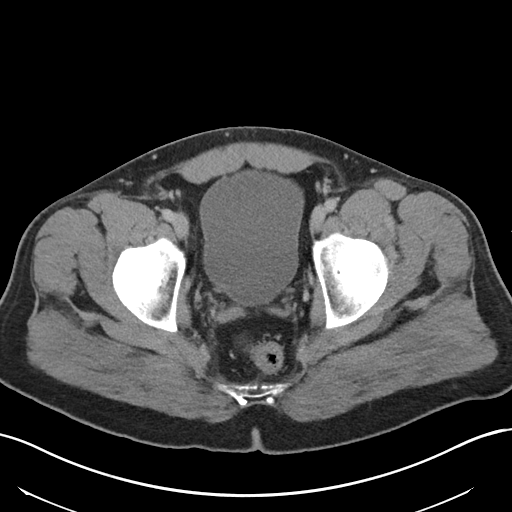
[im 23/112  lung]
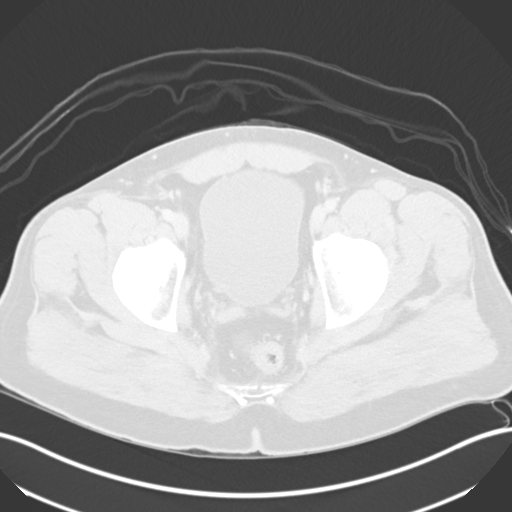
[im 23/112  bone]
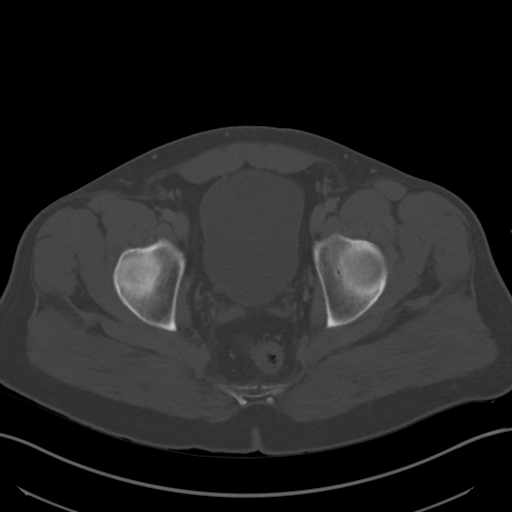
[im 45/112  soft-tissue]
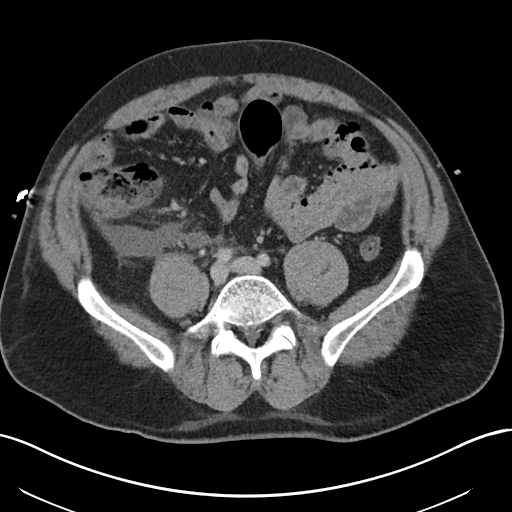
[im 45/112  lung]
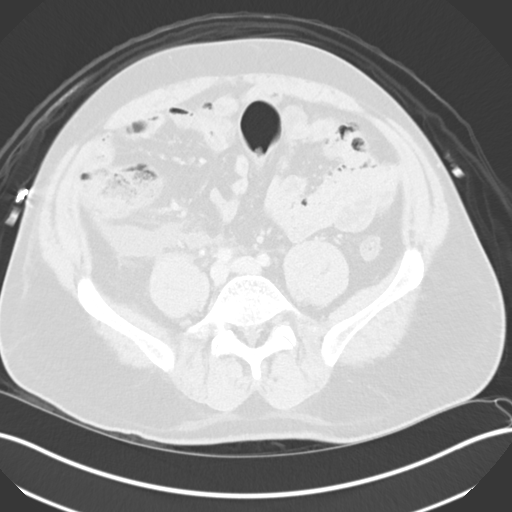
[im 67/112  soft-tissue]
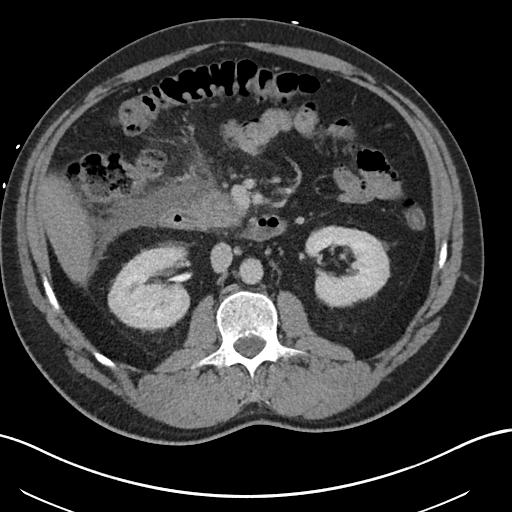
[im 67/112  lung]
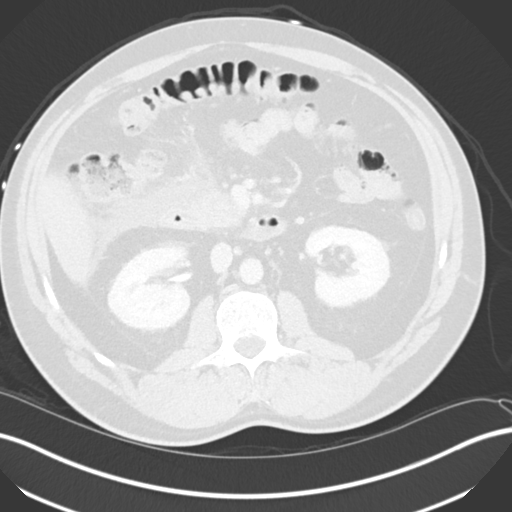
[im 89/112  soft-tissue]
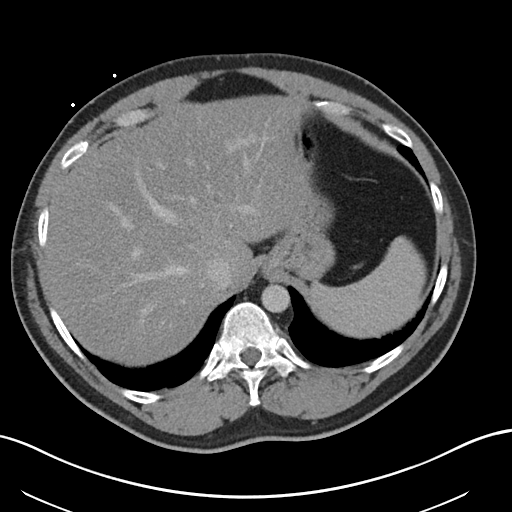
[im 89/112  lung]
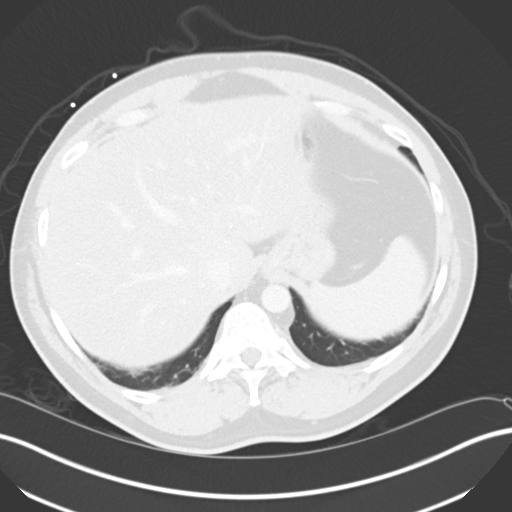

[11 of 46 positions shown; findings below may reference images not displayed]

FINDINGS: CTA CHEST FINDINGS

Cardiovascular: Moderately good opacification of the central and
segmental pulmonary arteries. No focal filling defects. No evidence
of significant pulmonary embolus. Normal heart size. No pericardial
effusion. Normal caliber thoracic aorta. No evidence of aortic
dissection. Great vessel origins are patent.

Mediastinum/Nodes: Esophagus is decompressed. No significant
lymphadenopathy in the chest.

Lungs/Pleura: Dependent atelectasis in the lung bases. No focal
airspace disease or consolidation. No pleural effusions. No
pneumothorax. Airways are patent.

Musculoskeletal: No chest wall abnormality. No acute or significant
osseous findings.

Review of the MIP images confirms the above findings.

CT ABDOMEN and PELVIS FINDINGS

Hepatobiliary: Diffuse fatty infiltration of the liver. No focal
lesions identified. Gallbladder and bile ducts are unremarkable.

Pancreas: Enlargement of the head of the pancreas with prominent
surrounding inflammatory stranding and edema around the head of the
pancreas, extending to the celiac axis, and along the right anterior
pararenal space and pericolic gutter. Changes likely to represent
acute pancreatitis. No pancreatic ductal dilatation. No loculated
fluid collections. Follow-up after resolution of acute process is
recommended to exclude underlying pancreatic mass. No mesenteric
vein thrombus.

Spleen: Normal in size without focal abnormality.

Adrenals/Urinary Tract: Adrenal glands are unremarkable. Kidneys are
normal, without renal calculi, focal lesion, or hydronephrosis.
Bladder is unremarkable.

Stomach/Bowel: Wall thickening of the duodenal C loop consistent
with reactive inflammatory change related to the pancreatitis.
Stomach, small bowel, and colon are not abnormally distended.
Appendix is normal.

Vascular/Lymphatic: No significant vascular findings are present. No
enlarged abdominal or pelvic lymph nodes.

Reproductive: Prostate is unremarkable.

Other: No abdominal wall hernia or abnormality. No abdominopelvic
ascites.

Musculoskeletal: No acute or significant osseous findings.

Review of the MIP images confirms the above findings.
IMPRESSION: 1. CTA CHEST: No evidence of significant pulmonary embolus. No
evidence of active pulmonary disease.
2. CT ABDOMEN AND PELVIS: Enlargement of the head of the pancreas
with prominent surrounding inflammatory stranding and edema
extending to the celiac axis, and along the right anterior pararenal
space and pericolic gutter. Changes likely to represent acute
pancreatitis. No pancreatic ductal dilatation. No loculated fluid
collections.
3. Diffuse fatty infiltration of the liver.
4. Wall thickening of the duodenal C loop consistent with reactive
inflammatory change related to the pancreatitis. No evidence of
bowel obstruction. Appendix is normal.

## 2017-11-28 MED ORDER — IOPAMIDOL (ISOVUE-370) INJECTION 76%
INTRAVENOUS | Status: AC
  Filled 2017-11-28: qty 100

## 2017-11-28 MED ORDER — IOPAMIDOL (ISOVUE-370) INJECTION 76%
100.0000 mL | Freq: Once | INTRAVENOUS | Status: AC | PRN
  Administered 2017-11-28: 100 mL via INTRAVENOUS

## 2017-11-28 MED ORDER — OXYCODONE-ACETAMINOPHEN 5-325 MG PO TABS
1.0000 | ORAL_TABLET | Freq: Once | ORAL | Status: AC
  Administered 2017-11-28: 1 via ORAL
  Filled 2017-11-28: qty 1

## 2017-11-28 NOTE — ED Provider Notes (Signed)
MSE was initiated and I personally evaluated the patient and placed orders (if any) at  3:10 PM on November 28, 2017.  The patient appears stable so that the remainder of the MSE may be completed by another provider.  Patient placed in Quick Look pathway, seen and evaluated   Chief Complaint: Right upper quadrant pain x1 week; shortness of breath x1 month  HPI:   37 year old male with past medical history of hypertension presents to ED for evaluation of 1 week history of pain.  States that the pain is intermittent, worse in the morning.  Describes the pain as sharp with no associated nausea, vomiting, bowel changes or urinary symptoms.  He has had a distant history of pancreatitis due to heavy alcohol consumption but states he does not feel similar.  He reports intermittent alcohol abuse now but none daily."  He also complains shortness of breath for the past month.  This worsened in the past week as well.  Reports shortness of breath mostly with walking and exertion.  Denies any chest pain.  Denies any history of MI, PE or DVT.  ROS: abdominal pain (one)  Physical Exam:   Gen: No distress  Neuro: Awake and Alert  Skin: Warm    Focused Exam: Tenderness palpation of the right upper quadrant and right middle quadrant without rebound or guarding noted.  No CVA tenderness.Tachycardic to low 100s. Lungs CTAB.   Initiation of care has begun. The patient has been counseled on the process, plan, and necessity for staying for the completion/evaluation, and the remainder of the medical screening examination    Dietrich Pates, PA-C 11/28/17 1512    Melene Plan, DO 11/29/17 [id number]

## 2017-11-28 NOTE — ED Notes (Signed)
Provider at bedside during triage.

## 2017-11-28 NOTE — ED Triage Notes (Signed)
Onset of shortness of breath a month ago and recently worsening with RUQ abdominal pain. States he has history of high blood pressure.

## 2017-11-28 NOTE — ED Notes (Signed)
Patient transported to CT 

## 2017-11-29 ENCOUNTER — Ambulatory Visit (HOSPITAL_COMMUNITY)
Admission: RE | Admit: 2017-11-29 | Discharge: 2017-11-29 | Disposition: A | Discharge status: Home / Self Care | Payer: Self-pay | Source: Ambulatory Visit | Attending: Emergency Medicine | Admitting: Emergency Medicine

## 2017-11-29 ENCOUNTER — Telehealth: Payer: Self-pay

## 2017-11-29 DIAGNOSIS — M79604 Pain in right leg: Secondary | ICD-10-CM | POA: Insufficient documentation

## 2017-11-29 DIAGNOSIS — R52 Pain, unspecified: Secondary | ICD-10-CM

## 2017-11-29 MED ORDER — OXYCODONE-ACETAMINOPHEN 5-325 MG PO TABS: 1.0000 | ORAL_TABLET | ORAL | 0 refills | Status: DC | PRN

## 2017-11-29 MED ORDER — ONDANSETRON 4 MG PO TBDP: 4.0000 mg | ORAL_TABLET | Freq: Three times a day (TID) | ORAL | 0 refills | Status: DC | PRN

## 2017-11-29 NOTE — ED Notes (Signed)
Patient verbalizes understanding of discharge instructions. Opportunity for questioning and answers were provided. Armband removed by staff, pt discharged from ED.  

## 2017-11-29 NOTE — Discharge Instructions (Signed)
Take the prescribed medication as directed. Follow-up with your primary care doctor-- keep appt you have scheduled. Return to the ED for new or worsening symptoms.

## 2017-11-29 NOTE — ED Provider Notes (Signed)
Assumed care from Dr. Rhunette Croft at shift change.  See prior note for full H&P.  Briefly 37 year old male here with right-sided abdominal pain and shortness of breath for the past week.  Symptoms worsening today..  Labs overall reassuring, d-dimer is greater than 4.  No history of DVT or PE.  Plan:  CT PE as well as CT abdomen/pelvis pending.  If negative, can dispo home to follow-up for LE venous duplex in the morning.  Results for orders placed or performed during the hospital encounter of 11/28/17  Comprehensive metabolic panel  Result Value Ref Range   Sodium 136 135 - 145 mmol/L   Potassium 4.0 3.5 - 5.1 mmol/L   Chloride 102 98 - 111 mmol/L   CO2 23 22 - 32 mmol/L   Glucose, Bld 130 (H) 70 - 99 mg/dL   BUN 10 6 - 20 mg/dL   Creatinine, Ser 1.61 0.61 - 1.24 mg/dL   Calcium 9.0 8.9 - 09.6 mg/dL   Total Protein 7.3 6.5 - 8.1 g/dL   Albumin 3.2 (L) 3.5 - 5.0 g/dL   AST 24 15 - 41 U/L   ALT 33 0 - 44 U/L   Alkaline Phosphatase 63 38 - 126 U/L   Total Bilirubin 0.5 0.3 - 1.2 mg/dL   GFR calc non Af Amer >60 >60 mL/min   GFR calc Af Amer >60 >60 mL/min   Anion gap 11 5 - 15  CBC with Differential  Result Value Ref Range   WBC 13.7 (H) 4.0 - 10.5 K/uL   RBC 4.68 4.22 - 5.81 MIL/uL   Hemoglobin 14.9 13.0 - 17.0 g/dL   HCT 04.5 40.9 - 81.1 %   MCV 94.2 80.0 - 100.0 fL   MCH 31.8 26.0 - 34.0 pg   MCHC 33.8 30.0 - 36.0 g/dL   RDW 91.4 78.2 - 95.6 %   Platelets 258 150 - 400 K/uL   nRBC 0.0 0.0 - 0.2 %   Neutrophils Relative % 78 %   Neutro Abs 10.9 (H) 1.7 - 7.7 K/uL   Lymphocytes Relative 8 %   Lymphs Abs 1.2 0.7 - 4.0 K/uL   Monocytes Relative 10 %   Monocytes Absolute 1.3 (H) 0.1 - 1.0 K/uL   Eosinophils Relative 1 %   Eosinophils Absolute 0.1 0.0 - 0.5 K/uL   Basophils Relative 1 %   Basophils Absolute 0.1 0.0 - 0.1 K/uL   Immature Granulocytes 2 %   Abs Immature Granulocytes 0.20 (H) 0.00 - 0.07 K/uL  Lipase, blood  Result Value Ref Range   Lipase 52 (H) 11 - 51 U/L   Brain natriuretic peptide  Result Value Ref Range   B Natriuretic Peptide 36.4 0.0 - 100.0 pg/mL  D-dimer, quantitative (not at [facility])  Result Value Ref Range   D-Dimer, Quant 4.39 (H) 0.00 - 0.50 ug/mL-FEU  I-stat troponin, ED  Result Value Ref Range   Troponin i, poc 0.00 0.00 - 0.08 ng/mL   Comment 3          I-stat troponin, ED  Result Value Ref Range   Troponin i, poc 0.01 0.00 - 0.08 ng/mL   Comment 3           Dg Chest 2 View  Result Date: 11/28/2017 CLINICAL DATA:  Onset of dyspnea and right upper quadrant pain. Cough x1 week. EXAM: CHEST - 2 VIEW COMPARISON:  05/20/2017 FINDINGS: The heart size and mediastinal contours are within normal limits. Both lungs are clear. The visualized  skeletal structures are unremarkable. IMPRESSION: No active cardiopulmonary disease. Electronically Signed   By: Tollie Eth M.D.   On: 11/28/2017 16:34   Ct Angio Chest Pe W And/or Wo Contrast  Result Date: 11/28/2017 CLINICAL DATA:  Shortness of breath beginning a month ago. Worsening right upper quadrant abdominal pain. EXAM: CT ANGIOGRAPHY CHEST CT ABDOMEN AND PELVIS WITH CONTRAST TECHNIQUE: Multidetector CT imaging of the chest was performed using the standard protocol during bolus administration of intravenous contrast. Multiplanar CT image reconstructions and MIPs were obtained to evaluate the vascular anatomy. Multidetector CT imaging of the abdomen and pelvis was performed using the standard protocol during bolus administration of intravenous contrast. CONTRAST:  ISOVUE-370 IOPAMIDOL (ISOVUE-370) INJECTION 76% COMPARISON:  CT abdomen and pelvis 02/16/2017 FINDINGS: CTA CHEST FINDINGS Cardiovascular: Moderately good opacification of the central and segmental pulmonary arteries. No focal filling defects. No evidence of significant pulmonary embolus. Normal heart size. No pericardial effusion. Normal caliber thoracic aorta. No evidence of aortic dissection. Great vessel origins are patent.  Mediastinum/Nodes: Esophagus is decompressed. No significant lymphadenopathy in the chest. Lungs/Pleura: Dependent atelectasis in the lung bases. No focal airspace disease or consolidation. No pleural effusions. No pneumothorax. Airways are patent. Musculoskeletal: No chest wall abnormality. No acute or significant osseous findings. Review of the MIP images confirms the above findings. CT ABDOMEN and PELVIS FINDINGS Hepatobiliary: Diffuse fatty infiltration of the liver. No focal lesions identified. Gallbladder and bile ducts are unremarkable. Pancreas: Enlargement of the head of the pancreas with prominent surrounding inflammatory stranding and edema around the head of the pancreas, extending to the celiac axis, and along the right anterior pararenal space and pericolic gutter. Changes likely to represent acute pancreatitis. No pancreatic ductal dilatation. No loculated fluid collections. Follow-up after resolution of acute process is recommended to exclude underlying pancreatic mass. No mesenteric vein thrombus. Spleen: Normal in size without focal abnormality. Adrenals/Urinary Tract: Adrenal glands are unremarkable. Kidneys are normal, without renal calculi, focal lesion, or hydronephrosis. Bladder is unremarkable. Stomach/Bowel: Wall thickening of the duodenal C loop consistent with reactive inflammatory change related to the pancreatitis. Stomach, small bowel, and colon are not abnormally distended. Appendix is normal. Vascular/Lymphatic: No significant vascular findings are present. No enlarged abdominal or pelvic lymph nodes. Reproductive: Prostate is unremarkable. Other: No abdominal wall hernia or abnormality. No abdominopelvic ascites. Musculoskeletal: No acute or significant osseous findings. Review of the MIP images confirms the above findings. IMPRESSION: 1. CTA CHEST: No evidence of significant pulmonary embolus. No evidence of active pulmonary disease. 2. CT ABDOMEN AND PELVIS: Enlargement of the head  of the pancreas with prominent surrounding inflammatory stranding and edema extending to the celiac axis, and along the right anterior pararenal space and pericolic gutter. Changes likely to represent acute pancreatitis. No pancreatic ductal dilatation. No loculated fluid collections. 3. Diffuse fatty infiltration of the liver. 4. Wall thickening of the duodenal C loop consistent with reactive inflammatory change related to the pancreatitis. No evidence of bowel obstruction. Appendix is normal. Electronically Signed   By: Burman Nieves M.D.   On: 11/28/2017 23:27   Ct Abdomen Pelvis W Contrast  Result Date: 11/28/2017 CLINICAL DATA:  Shortness of breath beginning a month ago. Worsening right upper quadrant abdominal pain. EXAM: CT ANGIOGRAPHY CHEST CT ABDOMEN AND PELVIS WITH CONTRAST TECHNIQUE: Multidetector CT imaging of the chest was performed using the standard protocol during bolus administration of intravenous contrast. Multiplanar CT image reconstructions and MIPs were obtained to evaluate the vascular anatomy. Multidetector CT imaging of the  abdomen and pelvis was performed using the standard protocol during bolus administration of intravenous contrast. CONTRAST:  ISOVUE-370 IOPAMIDOL (ISOVUE-370) INJECTION 76% COMPARISON:  CT abdomen and pelvis 02/16/2017 FINDINGS: CTA CHEST FINDINGS Cardiovascular: Moderately good opacification of the central and segmental pulmonary arteries. No focal filling defects. No evidence of significant pulmonary embolus. Normal heart size. No pericardial effusion. Normal caliber thoracic aorta. No evidence of aortic dissection. Great vessel origins are patent. Mediastinum/Nodes: Esophagus is decompressed. No significant lymphadenopathy in the chest. Lungs/Pleura: Dependent atelectasis in the lung bases. No focal airspace disease or consolidation. No pleural effusions. No pneumothorax. Airways are patent. Musculoskeletal: No chest wall abnormality. No acute or  significant osseous findings. Review of the MIP images confirms the above findings. CT ABDOMEN and PELVIS FINDINGS Hepatobiliary: Diffuse fatty infiltration of the liver. No focal lesions identified. Gallbladder and bile ducts are unremarkable. Pancreas: Enlargement of the head of the pancreas with prominent surrounding inflammatory stranding and edema around the head of the pancreas, extending to the celiac axis, and along the right anterior pararenal space and pericolic gutter. Changes likely to represent acute pancreatitis. No pancreatic ductal dilatation. No loculated fluid collections. Follow-up after resolution of acute process is recommended to exclude underlying pancreatic mass. No mesenteric vein thrombus. Spleen: Normal in size without focal abnormality. Adrenals/Urinary Tract: Adrenal glands are unremarkable. Kidneys are normal, without renal calculi, focal lesion, or hydronephrosis. Bladder is unremarkable. Stomach/Bowel: Wall thickening of the duodenal C loop consistent with reactive inflammatory change related to the pancreatitis. Stomach, small bowel, and colon are not abnormally distended. Appendix is normal. Vascular/Lymphatic: No significant vascular findings are present. No enlarged abdominal or pelvic lymph nodes. Reproductive: Prostate is unremarkable. Other: No abdominal wall hernia or abnormality. No abdominopelvic ascites. Musculoskeletal: No acute or significant osseous findings. Review of the MIP images confirms the above findings. IMPRESSION: 1. CTA CHEST: No evidence of significant pulmonary embolus. No evidence of active pulmonary disease. 2. CT ABDOMEN AND PELVIS: Enlargement of the head of the pancreas with prominent surrounding inflammatory stranding and edema extending to the celiac axis, and along the right anterior pararenal space and pericolic gutter. Changes likely to represent acute pancreatitis. No pancreatic ductal dilatation. No loculated fluid collections. 3. Diffuse fatty  infiltration of the liver. 4. Wall thickening of the duodenal C loop consistent with reactive inflammatory change related to the pancreatitis. No evidence of bowel obstruction. Appendix is normal. Electronically Signed   By: Burman Nieves M.D.   On: 11/28/2017 23:27   US Abdomen Limited Ruq  Result Date: 11/28/2017 CLINICAL DATA:  Right upper quadrant pain.  History of alcoholism. EXAM: ULTRASOUND ABDOMEN LIMITED RIGHT UPPER QUADRANT COMPARISON:  None. FINDINGS: Gallbladder: No gallstones or wall thickening visualized. Gallbladder polyps are noted, largest measures 0.6 cm. No sonographic Murphy sign noted by sonographer. Common bile duct: Diameter: 2.8 mm Liver: No focal lesion identified. Diffuse increased echotexture of the liver is noted. Portal vein is patent on color Doppler imaging with normal direction of blood flow towards the liver. IMPRESSION: Diffuse increased echotexture of the liver is identified, nonspecific but can be seen in fatty infiltration of liver. Gallbladder polyps.  No sonographic evidence of acute cholecystitis. Electronically Signed   By: Sherian Rein M.D.   On: 11/28/2017 16:11   CT without findings of PE.  CT of abdomen/pelvis with findings of pancreatitis.  No gallstones, some polyps seen.  Denies recent heavy alcohol use, does have history of this though..  Lipase today is 52.  Patient is comfortable  and in no acute distress.  Vitals are stable.  Feel he is appropriate for discharge home.  He will return in the morning for duplex of the legs to rule out DVT given elevated d-dimer.  Patient has scheduled follow-up with his doctor within the next few days.  He will return here for any new/acute changes.   Garlon Hatchet, PA-C 11/29/17 0028    Gilda Crease, MD 11/30/17 [phone]

## 2017-11-29 NOTE — Progress Notes (Signed)
Right lower extremity venous duplex has been completed. Negative for DVT.  11/29/17 8:18 AM Olen Cordial RVT

## 2017-11-29 NOTE — Telephone Encounter (Signed)
Left a vm that appointment is 10/14 at 3:20pm and to call and reschedule if they are unable to make it.

## 2017-12-03 ENCOUNTER — Other Ambulatory Visit: Payer: Self-pay | Admitting: Family Medicine

## 2017-12-03 ENCOUNTER — Ambulatory Visit (INDEPENDENT_AMBULATORY_CARE_PROVIDER_SITE_OTHER): Payer: Self-pay | Admitting: Family Medicine

## 2017-12-03 ENCOUNTER — Encounter: Payer: Self-pay | Admitting: Family Medicine

## 2017-12-03 VITALS — BP 176/112 | HR 120 | Temp 98.4°F | Resp 16 | Ht 71.0 in | Wt 221.0 lb

## 2017-12-03 DIAGNOSIS — I10 Essential (primary) hypertension: Secondary | ICD-10-CM

## 2017-12-03 DIAGNOSIS — K859 Acute pancreatitis without necrosis or infection, unspecified: Secondary | ICD-10-CM

## 2017-12-03 DIAGNOSIS — N6459 Other signs and symptoms in breast: Secondary | ICD-10-CM

## 2017-12-03 DIAGNOSIS — Z23 Encounter for immunization: Secondary | ICD-10-CM

## 2017-12-03 MED ORDER — AMLODIPINE BESYLATE 10 MG PO TABS: 10.0000 mg | ORAL_TABLET | Freq: Every day | ORAL | 1 refills | Status: DC

## 2017-12-03 MED ORDER — VENLAFAXINE HCL ER 37.5 MG PO CP24: 37.5000 mg | ORAL_CAPSULE | Freq: Every day | ORAL | 0 refills | Status: DC

## 2017-12-03 MED ORDER — LOSARTAN POTASSIUM 100 MG PO TABS: 100.0000 mg | ORAL_TABLET | Freq: Every day | ORAL | 1 refills | Status: DC

## 2017-12-03 MED ORDER — PROPRANOLOL HCL ER 60 MG PO CP24: 60.0000 mg | ORAL_CAPSULE | Freq: Every day | ORAL | 1 refills | Status: DC

## 2017-12-03 MED ORDER — CLONIDINE HCL 0.1 MG PO TABS: 0.1000 mg | ORAL_TABLET | Freq: Three times a day (TID) | ORAL | 0 refills | Status: DC

## 2017-12-03 MED ORDER — CLONIDINE HCL 0.1 MG PO TABS
0.2000 mg | ORAL_TABLET | Freq: Once | ORAL | Status: AC
  Administered 2017-12-03: 0.2 mg via ORAL

## 2017-12-03 NOTE — Progress Notes (Signed)
Patient [facility]  Provider: Lanae Boast, FNP   Hypertension Follow Up Visit  SUBJECTIVE:  Peter [name] is a 37 y.o. male who  has a past medical history of Alcoholism ([place]), Hypertension, and Substance abuse ([place]). .   New concerns: Recently seen in the ED due to right Upper quadrant pain. Patient diagnosed with acute pancreatitis without infection. Given zofran and percocet.  Patient with WBC of 13.7 11/28/2017.  Patient continues to endorse mild right upper quadrant pain and shortness of breath.  Blood pressure elevated in the ED x2 and today.  Patient states that he is compliant with his medications.  Was given clonidine 0.2 in the office today with minimal relief.  Patient also is on Effexor 75 mg every morning. Patient states that his blood pressures are not this elevated at home and contributes this elevation to white coat syndrome.  Blood pressure has been consistently high per clinic and hospital records.  Current Outpatient Medications  Medication Sig Dispense Refill  . amLODipine (NORVASC) 5 MG tablet Take 1 tablet (5 mg total) by mouth daily. 90 tablet 1  . cloNIDine (CATAPRES) 0.2 MG tablet Take 1 tablet (0.2 mg total) by mouth 3 (three) times daily. 080 tablet 1  . folic acid (FOLVITE) 1 MG tablet Take 1 tablet (1 mg total) by mouth daily. 30 tablet 0  . ibuprofen (ADVIL,MOTRIN) 200 MG tablet Take 200 mg by mouth every 6 (six) hours as needed (for pain or headaches).    . losartan (COZAAR) 50 MG tablet Take 1 tablet (50 mg total) by mouth daily. 90 tablet 1  . multivitamin (ONE-A-DAY MEN'S) TABS tablet Take 1 tablet by mouth daily.    . ondansetron (ZOFRAN ODT) 4 MG disintegrating tablet Take 1 tablet (4 mg total) by mouth every 8 (eight) hours as needed for nausea. 10 tablet 0  . oxyCODONE-acetaminophen (PERCOCET) 5-325 MG tablet Take 1 tablet by mouth every 4 (four) hours as needed. 20 tablet 0  . thiamine (VITAMIN B-1)  100 MG tablet Take 1 tablet (100 mg total) by mouth daily. 30 tablet 0  . venlafaxine XR (EFFEXOR-XR) 75 MG 24 hr capsule Take 1 capsule (75 mg total) by mouth daily with breakfast. 90 capsule 1  . fenofibrate (TRICOR) 48 MG tablet Take 1 tablet (48 mg total) by mouth daily. (Patient not taking: Reported on 11/28/2017) 30 tablet 11   Current Facility-Administered Medications  Medication Dose Route Frequency Provider Last Rate Last Dose  . cloNIDine (CATAPRES) tablet 0.2 mg  0.2 mg Oral Once Lanae Boast, FNP        IMPRESSION: 1. CTA CHEST: No evidence of significant pulmonary embolus. No evidence of active pulmonary disease. 2. CT ABDOMEN AND PELVIS: Enlargement of the head of the pancreas with prominent surrounding inflammatory stranding and edema extending to the celiac axis, and along the right anterior pararenal space and pericolic gutter. Changes likely to represent acute pancreatitis. No pancreatic ductal dilatation. No loculated fluid collections. 3. Diffuse fatty infiltration of the liver. 4. Wall thickening of the duodenal C loop consistent with reactive inflammatory change related to the pancreatitis. No evidence of bowel obstruction. Appendix is normal.  Recent Results (from the past 2160 hour(s))  Comprehensive metabolic panel     Status: Abnormal   Collection Time: 11/28/17  3:32 PM  Result Value Ref Range   Sodium 136 135 - 145 mmol/L   Potassium 4.0 3.5 - 5.1 mmol/L   Chloride 102 98 -  111 mmol/L   CO2 23 22 - 32 mmol/L   Glucose, Bld 130 (H) 70 - 99 mg/dL   BUN 10 6 - 20 mg/dL   Creatinine, Ser 0.97 0.61 - 1.24 mg/dL   Calcium 9.0 8.9 - 10.3 mg/dL   Total Protein 7.3 6.5 - 8.1 g/dL   Albumin 3.2 (L) 3.5 - 5.0 g/dL   AST 24 15 - 41 U/L   ALT 33 0 - 44 U/L   Alkaline Phosphatase 63 38 - 126 U/L   Total Bilirubin 0.5 0.3 - 1.2 mg/dL   GFR calc non Af Amer >60 >60 mL/min   GFR calc Af Amer >60 >60 mL/min    Comment: (NOTE) The eGFR has been calculated using  the CKD EPI equation. This calculation has not been validated in all clinical situations. eGFR's persistently <60 mL/min signify possible Chronic Kidney Disease.    Anion gap 11 5 - 15    Comment: Performed at [facility] [address] [place], [place] [address]  CBC with Differential     Status: Abnormal   Collection Time: 11/28/17  3:32 PM  Result Value Ref Range   WBC 13.7 (H) 4.0 - 10.5 K/uL   RBC 4.68 4.22 - 5.81 MIL/uL   Hemoglobin 14.9 13.0 - 17.0 g/dL   HCT 44.1 39.0 - 52.0 %   MCV 94.2 80.0 - 100.0 fL   MCH 31.8 26.0 - 34.0 pg   MCHC 33.8 30.0 - 36.0 g/dL   RDW 12.7 11.5 - 15.5 %   Platelets 258 150 - 400 K/uL   nRBC 0.0 0.0 - 0.2 %   Neutrophils Relative % 78 %   Neutro Abs 10.9 (H) 1.7 - 7.7 K/uL   Lymphocytes Relative 8 %   Lymphs Abs 1.2 0.7 - 4.0 K/uL   Monocytes Relative 10 %   Monocytes Absolute 1.3 (H) 0.1 - 1.0 K/uL   Eosinophils Relative 1 %   Eosinophils Absolute 0.1 0.0 - 0.5 K/uL   Basophils Relative 1 %   Basophils Absolute 0.1 0.0 - 0.1 K/uL   Immature Granulocytes 2 %   Abs Immature Granulocytes 0.20 (H) 0.00 - 0.07 K/uL    Comment: Performed at [facility] [address], [place], [place] [address]  Lipase, blood     Status: Abnormal   Collection Time: 11/28/17  3:32 PM  Result Value Ref Range   Lipase 52 (H) 11 - 51 U/L    Comment: Performed at [facility], [place] [address] [place], [place] [address]  I-stat troponin, ED     Status: None   Collection Time: 11/28/17  3:43 PM  Result Value Ref Range   Troponin i, poc 0.00 0.00 - 0.08 ng/mL   Comment 3            Comment: Due to the release kinetics of cTnI, a negative result within the first hours of the onset of symptoms does not rule out myocardial infarction with certainty. If myocardial infarction is still suspected, repeat the test at appropriate intervals.   Brain natriuretic peptide     Status: None   Collection Time: 11/28/17  9:09 PM  Result Value Ref Range     B Natriuretic Peptide 36.4 0.0 - 100.0 pg/mL    Comment: Performed at [facility], [place] [address] [place], [place] [address]  D-dimer, quantitative (not at [facility])     Status: Abnormal   Collection Time: 11/28/17  9:09 PM  Result Value Ref Range   D-Dimer, Quant 4.39 (H) 0.00 - 0.50 ug/mL-FEU    Comment: (NOTE) At the manufacturer cut-off of 0.50 ug/mL FEU, this assay has been documented to exclude PE with a sensitivity and negative predictive value of 97 to 99%.  At this time, this assay has not been approved by the FDA to exclude DVT/VTE. Results should be correlated with clinical presentation. Performed at [facility], [place] [address] [place], [place] [address]   I-stat troponin, ED     Status: None   Collection Time: 11/28/17  9:44 PM  Result Value Ref Range   Troponin i, poc 0.01 0.00 - 0.08 ng/mL   Comment 3            Comment: Due to the release kinetics of cTnI, a negative result within the first hours of the onset of symptoms does not rule out myocardial infarction with certainty. If myocardial infarction is still suspected, repeat the test at appropriate intervals.     Hypertension ROS: Review of Systems  Constitutional: Negative.   HENT: Negative.   Eyes: Negative.   Respiratory: Negative.   Cardiovascular: Negative.   Gastrointestinal: Positive for abdominal pain (RUQ).  Genitourinary: Negative.   Musculoskeletal: Negative.   Skin: Negative.   Neurological: Negative.   Psychiatric/Behavioral: Negative.      OBJECTIVE:   BP (!) 176/112 (BP Location: Right Arm, Patient Position: Sitting, Cuff Size: Large)   Pulse (!) 120   Temp 98.4 F (36.9 C) (Oral)   Resp 16   Ht 5' 11"  (1.803 m)   Wt 221 lb (100.2 kg)   SpO2 99%   BMI 30.82 kg/m   Physical Exam  Constitutional: He is oriented to person, place, and time. He appears well-developed and well-nourished. No distress.  HENT:  Head: Normocephalic and atraumatic.  Eyes: Pupils are equal,  round, and reactive to light. EOM are normal.  Neck: Normal range of motion.  Cardiovascular: Regular rhythm, S1 normal and normal heart sounds. Tachycardia present.  Pulmonary/Chest: Effort normal and breath sounds normal. No respiratory distress.  Abdominal: Soft. Bowel sounds are normal. He exhibits no distension. There is tenderness (RUQ).  Musculoskeletal: Normal range of motion.  Neurological: He is alert and oriented to person, place, and time.  Skin: Skin is warm and dry.  Psychiatric: He has a normal mood and affect. His behavior is normal. Judgment and thought content normal.     ASSESSMENT/PLAN:  1. Essential hypertension Will slowly wean clonidine.  Patient reports somnolence with use.  Increase losartan and Norvasc.  Add propranolol ER.  Patient denies headache dizziness.  Will also refer to cardiology for further evaluation.  Advised to decrease Effexor to 37.5 mg every day because this med can also increase blood pressure. - cloNIDine (CATAPRES) tablet 0.2 mg - losartan (COZAAR) 100 MG tablet; Take 1 tablet (100 mg total) by mouth daily.  Dispense: 90 tablet; Refill: 1 - amLODipine (NORVASC) 10 MG tablet; Take 1 tablet (10 mg total) by mouth daily.  Dispense: 90 tablet; Refill: 1 - propranolol ER (INDERAL LA) 60 MG 24 hr capsule; Take 1 capsule (60 mg total) by mouth daily.  Dispense: 90 capsule; Refill: 1 - cloNIDine (CATAPRES) 0.1 MG tablet; Take 1 tablet (0.1 mg total) by mouth 3 (three) times daily.  Dispense: 90 tablet; Refill: 0 - Ambulatory referral to Cardiology  2. Acute pancreatitis, unspecified complication status, unspecified pancreatitis type  continue present plan and medications. - CBC with  Differential - Comprehensive metabolic panel - Lipase - Sedimentation Rate - C-reactive protein - Amylase  3. Influenza vaccination administered at current visit Influenza vaccination given in the office visit today. - Flu Vaccine QUAD 6+ mos PF IM (Fluarix Quad  PF)   The patient is asked to make an attempt to improve diet and exercise patterns to aid in medical management of this problem.  Return to care as scheduled and prn. Patient verbalized understanding and agreed with plan of care.    Ms. Doug Sou. Nathaneil Canary, FNP-BC Patient [facility] [address] [place], [place] [address] [phone]

## 2017-12-03 NOTE — Patient Instructions (Signed)
Intermittent Claudication Intermittent claudication is pain in your leg that occurs when you walk or exercise and goes away when you rest. The pain can occur in one or both legs. What are the causes? Intermittent claudication is caused by the buildup of plaque within the major arteries in the body (atherosclerosis). The plaque, which makes arteries stiff and narrow, prevents enough blood from reaching your leg muscles. The pain occurs when you walk or exercise because your muscles need more blood when you are moving and exercising. What increases the risk? Risk factors include:  A family history of atherosclerosis.  A personal history of stroke or heart disease.  Older age.  Being inactive or overweight.  Smoking cigarettes.  Having another health condition such as: ? Diabetes. ? High blood pressure. ? High cholesterol.  What are the signs or symptoms? Your hip or leg may:  Ache.  Cramp.  Feel tight.  Feel weak.  Feel heavy.  Over time, you may feel pain in your calf, thigh, or hip. How is this diagnosed? Your health care provider may diagnose intermittent claudication based on your symptoms and medical history. Your health care provider may also do tests to learn more about your condition. These may include:  Blood tests.  An ultrasound.  Imaging tests such as angiography, magnetic resonance angiography (MRA), and computed tomography angiography (CTA).  How is this treated? You may be treated for problems such as:  High blood pressure.  High cholesterol.  Diabetes.  Other treatments may include:  Lifestyle changes such as: ? Starting an exercise program. ? Losing weight. ? Quitting smoking.  Medicines to help restore blood flow through your legs.  Blood vessel surgery (angioplasty) to restore blood flow if your intermittent claudication is caused by severe peripheral artery disease.  Follow these instructions at home:  Manage any other health  conditions you have.  Eat a diet low in saturated fats and calories to maintain a healthy weight.  Quit smoking, if you smoke.  Take medicines only as directed by your health care provider.  If your health care provider recommended an exercise program for you, follow it as directed. Your exercise program may involve: ? Walking three or more times a week. ? Walking until you have certain symptoms of intermittent claudication. ? Resting until symptoms go away. ? Gradually increasing walking time to about 50 minutes a day. Contact a health care provider if: Your condition is not getting better or is getting worse. Get help right away if:  You have chest pain.  You have difficulty breathing.  You develop arm weakness.  You have trouble speaking.  Your face begins to droop. This information is not intended to replace advice given to you by your health care provider. Make sure you discuss any questions you have with your health care provider. Document Released: 12/10/2003 Document Revised: 07/15/2015 Document Reviewed: 05/15/2013 Elsevier Interactive Patient Education  2017 Elsevier Inc. Hypertension Hypertension is another name for high blood pressure. High blood pressure forces your heart to work harder to pump blood. This can cause problems over time. There are two numbers in a blood pressure reading. There is a top number (systolic) over a bottom number (diastolic). It is best to have a blood pressure below 120/80. Healthy choices can help lower your blood pressure. You may need medicine to help lower your blood pressure if:  Your blood pressure cannot be lowered with healthy choices.  Your blood pressure is higher than 130/80.  Follow these instructions at  home: Eating and drinking  If directed, follow the DASH eating plan. This diet includes: ? Filling half of your plate at each meal with fruits and vegetables. ? Filling one quarter of your plate at each meal with whole  grains. Whole grains include whole wheat pasta, brown rice, and whole grain bread. ? Eating or drinking low-fat dairy products, such as skim milk or low-fat yogurt. ? Filling one quarter of your plate at each meal with low-fat (lean) proteins. Low-fat proteins include fish, skinless chicken, eggs, beans, and tofu. ? Avoiding fatty meat, cured and processed meat, or chicken with skin. ? Avoiding premade or processed food.  Eat less than 1,500 mg of salt (sodium) a day.  Limit alcohol use to no more than 1 drink a day for nonpregnant women and 2 drinks a day for men. One drink equals 12 oz of beer, 5 oz of wine, or 1 oz of hard liquor. Lifestyle  Work with your doctor to stay at a healthy weight or to lose weight. Ask your doctor what the best weight is for you.  Get at least 30 minutes of exercise that causes your heart to beat faster (aerobic exercise) most days of the week. This may include walking, swimming, or biking.  Get at least 30 minutes of exercise that strengthens your muscles (resistance exercise) at least 3 days a week. This may include lifting weights or pilates.  Do not use any products that contain nicotine or tobacco. This includes cigarettes and e-cigarettes. If you need help quitting, ask your doctor.  Check your blood pressure at home as told by your doctor.  Keep all follow-up visits as told by your doctor. This is important. Medicines  Take over-the-counter and prescription medicines only as told by your doctor. Follow directions carefully.  Do not skip doses of blood pressure medicine. The medicine does not work as well if you skip doses. Skipping doses also puts you at risk for problems.  Ask your doctor about side effects or reactions to medicines that you should watch for. Contact a doctor if:  You think you are having a reaction to the medicine you are taking.  You have headaches that keep coming back (recurring).  You feel dizzy.  You have swelling in  your ankles.  You have trouble with your vision. Get help right away if:  You get a very bad headache.  You start to feel confused.  You feel weak or numb.  You feel faint.  You get very bad pain in your: ? Chest. ? Belly (abdomen).  You throw up (vomit) more than once.  You have trouble breathing. Summary  Hypertension is another name for high blood pressure.  Making healthy choices can help lower blood pressure. If your blood pressure cannot be controlled with healthy choices, you may need to take medicine. This information is not intended to replace advice given to you by your health care provider. Make sure you discuss any questions you have with your health care provider. Document Released: 07/26/2007 Document Revised: 01/05/2016 Document Reviewed: 01/05/2016 Elsevier Interactive Patient Education  2018 Elsevier Inc. Propranolol extended-release capsules What is this medicine? PROPRANOLOL (proe PRAN oh lole) is a beta-blocker. Beta-blockers reduce the workload on the heart and help it to beat more regularly. This medicine is used to treat high blood pressure, heart muscle disease, and prevent chest pain caused by angina. It is also used to prevent migraine headaches. You should not use this medicine to treat a migraine that  has already started. This medicine may be used for other purposes; ask your health care provider or pharmacist if you have questions. COMMON BRAND NAME(S): Inderal LA, Inderal XL, InnoPran XL What should I tell my health care provider before I take this medicine? They need to know if you have any of these conditions: -circulation problems, or blood vessel disease -diabetes -history of heart attack or heart disease, vasospastic angina -kidney disease -liver disease -lung or breathing disease, like asthma or emphysema -pheochromocytoma -slow heart rate -thyroid disease -an unusual or allergic reaction to propranolol, other beta-blockers, medicines,  foods, dyes, or preservatives -pregnant or trying to get pregnant -breast-feeding How should I use this medicine? Take this medicine by mouth with a glass of water. Follow the directions on the prescription label. Do not crush or chew. Take your doses at regular intervals. Do not take your medicine more often than directed. Do not stop taking except on the advice of your doctor or health care professional. Talk to your pediatrician regarding the use of this medicine in children. Special care may be needed. Overdosage: If you think you have taken too much of this medicine contact a poison control center or emergency room at once. NOTE: This medicine is only for you. Do not share this medicine with others. What if I miss a dose? If you miss a dose, take it as soon as you can. If it is almost time for your next dose, take only that dose. Do not take double or extra doses. What may interact with this medicine? Do not take this medicine with any of the following medications: -feverfew -phenothiazines like chlorpromazine, mesoridazine, prochlorperazine, thioridazine This medicine may also interact with the following medications: -aluminum hydroxide gel -antipyrine -antiviral medicines for HIV or AIDS -barbiturates like phenobarbital -certain medicines for blood pressure, heart disease, irregular heart beat -cimetidine -ciprofloxacin -diazepam -fluconazole -haloperidol -isoniazid -medicines for cholesterol like cholestyramine or colestipol -medicines for mental depression -medicines for migraine headache like almotriptan, eletriptan, frovatriptan, naratriptan, rizatriptan, sumatriptan, zolmitriptan -NSAIDs, medicines for pain and inflammation, like ibuprofen or naproxen -phenytoin -rifampin -teniposide -theophylline -thyroid medicines -tolbutamide -warfarin -zileuton This list may not describe all possible interactions. Give your health care provider a list of all the medicines, herbs,  non-prescription drugs, or dietary supplements you use. Also tell them if you smoke, drink alcohol, or use illegal drugs. Some items may interact with your medicine. What should I watch for while using this medicine? Visit your doctor or health care professional for regular check ups. Contact your doctor right away if your symptoms worsen. Check your blood pressure and pulse rate regularly. Ask your health care professional what your blood pressure and pulse rate should be, and when you should contact them. Do not stop taking this medicine suddenly. This could lead to serious heart-related effects. You may get drowsy or dizzy. Do not drive, use machinery, or do anything that needs mental alertness until you know how this drug affects you. Do not stand or sit up quickly, especially if you are an older patient. This reduces the risk of dizzy or fainting spells. Alcohol can make you more drowsy and dizzy. Avoid alcoholic drinks. This medicine can affect blood sugar levels. If you have diabetes, check with your doctor or health care professional before you change your diet or the dose of your diabetic medicine. Do not treat yourself for coughs, colds, or pain while you are taking this medicine without asking your doctor or health care professional for advice. Some ingredients  may increase your blood pressure. What side effects may I notice from receiving this medicine? Side effects that you should report to your doctor or health care professional as soon as possible: -allergic reactions like skin rash, itching or hives, swelling of the face, lips, or tongue -breathing problems -changes in blood sugar -cold hands or feet -difficulty sleeping, nightmares -dry peeling skin -hallucinations -muscle cramps or weakness -slow heart rate -swelling of the legs and ankles -vomiting Side effects that usually do not require medical attention (report to your doctor or health care professional if they continue or  are bothersome): -change in sex drive or performance -diarrhea -dry sore eyes -hair loss -nausea -weak or tired This list may not describe all possible side effects. Call your doctor for medical advice about side effects. You may report side effects to FDA at 1-800-FDA-1088. Where should I keep my medicine? Keep out of the reach of children. Store at room temperature between 15 and 30 degrees C (59 and 86 degrees F). Protect from light, moisture and freezing. Keep container tightly closed. Throw away any unused medicine after the expiration date. NOTE: This sheet is a summary. It may not cover all possible information. If you have questions about this medicine, talk to your doctor, pharmacist, or health care provider.  2018 Elsevier/Gold Standard (2012-10-11 14:58:56)

## 2017-12-04 LAB — CBC WITH DIFFERENTIAL/PLATELET
Basophils Absolute: 0.1 10*3/uL (ref 0.0–0.2)
Basos: 1 %
EOS (ABSOLUTE): 0.2 10*3/uL (ref 0.0–0.4)
Eos: 1 %
Hematocrit: 45.8 % (ref 37.5–51.0)
Hemoglobin: 15.9 g/dL (ref 13.0–17.7)
Immature Grans (Abs): 0.3 10*3/uL — ABNORMAL HIGH (ref 0.0–0.1)
Immature Granulocytes: 3 %
Lymphocytes Absolute: 1.9 10*3/uL (ref 0.7–3.1)
Lymphs: 16 %
MCH: 31.5 pg (ref 26.6–33.0)
MCHC: 34.7 g/dL (ref 31.5–35.7)
MCV: 91 fL (ref 79–97)
Monocytes Absolute: 0.7 10*3/uL (ref 0.1–0.9)
Monocytes: 6 %
Neutrophils Absolute: 9 10*3/uL — ABNORMAL HIGH (ref 1.4–7.0)
Neutrophils: 73 %
Platelets: 389 10*3/uL (ref 150–450)
RBC: 5.05 x10E6/uL (ref 4.14–5.80)
RDW: 12.6 % (ref 12.3–15.4)
WBC: 12.2 10*3/uL — ABNORMAL HIGH (ref 3.4–10.8)

## 2017-12-04 LAB — COMPREHENSIVE METABOLIC PANEL
ALT: 28 IU/L (ref 0–44)
AST: 19 IU/L (ref 0–40)
Albumin/Globulin Ratio: 1.3 (ref 1.2–2.2)
Albumin: 4.3 g/dL (ref 3.5–5.5)
Alkaline Phosphatase: 81 IU/L (ref 39–117)
BUN/Creatinine Ratio: 16 (ref 9–20)
BUN: 17 mg/dL (ref 6–20)
Bilirubin Total: <0.2 mg/dL (ref 0.0–1.2)
CO2: 23 mmol/L (ref 20–29)
Calcium: 9.8 mg/dL (ref 8.7–10.2)
Chloride: 97 mmol/L (ref 96–106)
Creatinine, Ser: 1.08 mg/dL (ref 0.76–1.27)
GFR calc Af Amer: 102 mL/min/{1.73_m2} (ref >59)
GFR calc non Af Amer: 88 mL/min/{1.73_m2} (ref >59)
Globulin, Total: 3.3 g/dL (ref 1.5–4.5)
Glucose: 128 mg/dL — ABNORMAL HIGH (ref 65–99)
Potassium: 4.4 mmol/L (ref 3.5–5.2)
Sodium: 138 mmol/L (ref 134–144)
Total Protein: 7.6 g/dL (ref 6.0–8.5)

## 2017-12-04 LAB — C-REACTIVE PROTEIN: CRP: 31 mg/L — ABNORMAL HIGH (ref 0–10)

## 2017-12-04 LAB — LIPASE: Lipase: 89 U/L — ABNORMAL HIGH (ref 13–78)

## 2017-12-04 LAB — SEDIMENTATION RATE: Sed Rate: 23 mm/hr — ABNORMAL HIGH (ref 0–15)

## 2017-12-04 LAB — AMYLASE: Amylase: 60 U/L (ref 31–124)

## 2017-12-11 NOTE — ED Provider Notes (Signed)
[name] EMERGENCY DEPARTMENT Provider Note   CSN: [id number] Arrival date & time: 11/28/17  1429     History   Chief Complaint Chief Complaint  Patient presents with  . Abdominal Pain  . Shortness of Breath    HPI Jace Fermin is a 37 y.o. male.  HPI 37 year old male with past medical history of hypertension and heavy alcohol use presents to ED for evaluation of 1 week history of pain. Pt states that the pain is intermittent, worse in the morning and it is described as sharp pain with no associated nausea, vomiting, bowel changes or urinary symptoms.  He has had a distant history of pancreatitis due to heavy alcohol consumption but states he does not feel similar.  He reports intermittent alcohol abuse now but none daily." He also complains shortness of breath for the past month with exertion. There is no hx of CAD. Pt has no hx of PE, DVT and denies any exogenous hormone (testosterone / estrogen) use, long distance travels or surgery in the past 6 weeks, active cancer, recent immobilization.   Past Medical History:  Diagnosis Date  . Alcoholism (HCC)   . Hypertension   . Substance abuse [facility])     Patient Active Problem List   Diagnosis Date Noted  . Chest pain 05/20/2017  . Hypomagnesemia 02/15/2017  . Abdominal pain 02/14/2017  . Acute pancreatitis 02/14/2017  . Alcoholic hepatitis without ascites 02/14/2017  . Hypokalemia 12/03/2016  . Leukocytosis 12/03/2016  . Alcohol withdrawal (HCC) 12/02/2016  . Alcohol dependence with other alcohol-induced disorder (HCC) 11/18/2016  . 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 Essential hypertension 11/18/2016  . Elevated liver enzymes 11/14/2016    Past Surgical History:  Procedure Laterality Date  . DENTAL SURGERY          Home Medications    Prior to Admission medications   Medication Sig Start Date End Date Taking? Authorizing Provider  folic acid (FOLVITE) 1 MG tablet Take 1 tablet (1 mg total)  by mouth daily. 05/21/17  Yes Rolly Salter, MD  ibuprofen (ADVIL,MOTRIN) 200 MG tablet Take 200 mg by mouth every 6 (six) hours as needed (for pain or headaches).   Yes [provider]  multivitamin (ONE-A-DAY MEN'S) TABS tablet Take 1 tablet by mouth daily.   Yes [provider]  thiamine (VITAMIN B-1) 100 MG tablet Take 1 tablet (100 mg total) by mouth daily. 05/21/17  Yes Rolly Salter, MD  amLODipine (NORVASC) 10 MG tablet Take 1 tablet (10 mg total) by mouth daily. 12/03/17 06/01/18  Mike Gip, FNP  cloNIDine (CATAPRES) 0.1 MG tablet Take 1 tablet (0.1 mg total) by mouth 3 (three) times daily. 12/03/17   Mike Gip, FNP  fenofibrate (TRICOR) 48 MG tablet Take 1 tablet (48 mg total) by mouth daily. Patient not taking: Reported on 11/28/2017 09/03/17 09/03/18  Mike Gip, FNP  losartan (COZAAR) 100 MG tablet Take 1 tablet (100 mg total) by mouth daily. 12/03/17 06/01/18  Mike Gip, FNP  ondansetron (ZOFRAN ODT) 4 MG disintegrating tablet Take 1 tablet (4 mg total) by mouth every 8 (eight) hours as needed for nausea. 11/29/17   Garlon Hatchet, PA-C  oxyCODONE-acetaminophen (PERCOCET) 5-325 MG tablet Take 1 tablet by mouth every 4 (four) hours as needed. 11/29/17   Garlon Hatchet, PA-C  propranolol ER (INDERAL LA) 60 MG 24 hr capsule Take 1 capsule (60 mg total) by mouth daily. 12/03/17   Mike Gip, FNP  venlafaxine XR [facility]  XR) 37.5 MG 24 hr capsule Take 1 capsule (37.5 mg total) by mouth daily with breakfast. 12/03/17   Mike Gip, FNP    Family History Family History  Problem Relation Age of Onset  . Hypertension Father   . Stroke Father   . CAD Father   . Cancer Paternal Grandfather        Lung  . Hyperlipidemia Paternal Grandfather   . Hypertension Paternal Grandfather     Social History Social History   Tobacco Use  . Smoking status: Current Every Day Smoker    Packs/day: 0.50    Types: Cigarettes  . Smokeless tobacco: Never Used    Substance Use Topics  . Alcohol use: Yes    Comment:    . Drug use: No     Allergies   Patient has no known allergies.   Review of Systems Review of Systems  Constitutional: Positive for activity change.  Respiratory: Positive for shortness of breath.   Cardiovascular: Negative for chest pain.  Gastrointestinal: Positive for abdominal distention and abdominal pain.  All other systems reviewed and are negative.    Physical Exam Updated Vital Signs BP (!) 156/98   Pulse 94   Temp 98.5 F (36.9 C) (Oral)   Resp (!) 23   Ht 5\' 10"  (1.778 m)   Wt 99.8 kg   SpO2 98%   BMI 31.57 kg/m   Physical Exam  Constitutional: He is oriented to person, place, and time. He appears well-developed.  HENT:  Head: Atraumatic.  Neck: Neck supple.  Cardiovascular: Normal rate.  Pulmonary/Chest: Effort normal.  Abdominal: Soft. Bowel sounds are normal. There is generalized tenderness and tenderness in the epigastric area. There is no rigidity.  Neurological: He is alert and oriented to person, place, and time.  Skin: Skin is warm.  Bilateral trace lower extremity edema  Nursing note and vitals reviewed.    ED Treatments / Results  Labs (all labs ordered are listed, but only abnormal results are displayed) Labs Reviewed  COMPREHENSIVE METABOLIC PANEL - Abnormal; Notable for the following components:      Result Value   Glucose, Bld 130 (*)    Albumin 3.2 (*)    All other components within normal limits  CBC WITH DIFFERENTIAL/PLATELET - Abnormal; Notable for the following components:   WBC 13.7 (*)    Neutro Abs 10.9 (*)    Monocytes Absolute 1.3 (*)    Abs Immature Granulocytes 0.20 (*)    All other components within normal limits  LIPASE, BLOOD - Abnormal; Notable for the following components:   Lipase 52 (*)    All other components within normal limits  D-DIMER, QUANTITATIVE (NOT AT [facility]) - Abnormal; Notable for the following components:   D-Dimer, Quant 4.39 (*)    All  other components within normal limits  BRAIN NATRIURETIC PEPTIDE  I-STAT TROPONIN, ED  I-STAT TROPONIN, ED    EKG EKG Interpretation  Date/Time:  Wednesday November 28 2017 15:10:44 EDT Ventricular Rate:  101 PR Interval:  136 QRS Duration: 82 QT Interval:  322 QTC Calculation: 417 R Axis:   63 Text Interpretation:  Sinus tachycardia T wave abnormality, consider inferior ischemia Abnormal ECG No acute changes [id number] is not new No significant change since Confirmed by Derwood Kaplan [phone]) on 11/28/2017 9:07:53 PM   Radiology No results found.  Procedures Procedures (including critical care time)  Medications Ordered in ED Medications  oxyCODONE-acetaminophen (PERCOCET/ROXICET) 5-325 MG per tablet 1 tablet (1 tablet Oral Given 11/28/17  2249)  iopamidol (ISOVUE-370) 76 % injection 100 mL (100 mLs Intravenous Contrast Given 11/28/17 2253)     Initial Impression / Assessment and Plan / ED Course  I have reviewed the triage vital signs and the nursing notes.  Pertinent labs & imaging results that were available during my care of the patient were reviewed by me and considered in my medical decision making (see chart for details).     37 year old male comes in with chief complaint of chest pain and abdominal pain. He has history of hypertension and alcohol abuse.  Patient reports that he is having abdominal distention along with upper quadrant generalized tenderness which is different than his pancreatitis type pain.  He is also complaining of shortness of breath with exertion.  There is no history of PE.  D-dimer ordered and it is positive.  We will get a CT angios chest and abdomen to look rule out PE and evaluate for the abdominal pain.  Incoming team to follow-up on the CT scans.  Final Clinical Impressions(s) / ED Diagnoses   Final diagnoses:  RUQ pain  Acute pancreatitis without infection or necrosis, unspecified pancreatitis type    ED Discharge Orders          Ordered    oxyCODONE-acetaminophen (PERCOCET) 5-325 MG tablet  Every 4 hours PRN     11/29/17 0010    ondansetron (ZOFRAN ODT) 4 MG disintegrating tablet  Every 8 hours PRN     11/29/17 0010    LE VENOUS     11/28/17 2248           Derwood Kaplan, MD 12/11/17 0009

## 2017-12-13 ENCOUNTER — Telehealth: Payer: Self-pay

## 2017-12-13 NOTE — Telephone Encounter (Signed)
Patient will make appointment tomorrow at 320pm.

## 2017-12-14 ENCOUNTER — Ambulatory Visit (INDEPENDENT_AMBULATORY_CARE_PROVIDER_SITE_OTHER): Payer: Self-pay | Admitting: Family Medicine

## 2017-12-14 ENCOUNTER — Other Ambulatory Visit: Payer: Self-pay

## 2017-12-14 ENCOUNTER — Encounter: Payer: Self-pay | Admitting: Family Medicine

## 2017-12-14 VITALS — BP 148/100 | HR 128 | Temp 98.2°F | Resp 16 | Ht 71.0 in | Wt 221.0 lb

## 2017-12-14 DIAGNOSIS — I10 Essential (primary) hypertension: Secondary | ICD-10-CM

## 2017-12-14 MED ORDER — CLONIDINE HCL 0.1 MG PO TABS
0.2000 mg | ORAL_TABLET | Freq: Once | ORAL | Status: AC
  Administered 2017-12-14: 0.2 mg via ORAL

## 2017-12-14 MED ORDER — METOPROLOL TARTRATE 25 MG PO TABS: 25.0000 mg | ORAL_TABLET | Freq: Two times a day (BID) | ORAL | 2 refills | Status: DC

## 2017-12-14 NOTE — Patient Instructions (Signed)
Metoprolol tablets What is this medicine? METOPROLOL (me TOE proe lole) is a beta-blocker. Beta-blockers reduce the workload on the heart and help it to beat more regularly. This medicine is used to treat high blood pressure and to prevent chest pain. It is also used to after a heart attack and to prevent an additional heart attack from occurring. This medicine may be used for other purposes; ask your health care provider or pharmacist if you have questions. COMMON BRAND NAME(S): Lopressor What should I tell my health care provider before I take this medicine? They need to know if you have any of these conditions: -diabetes -heart or vessel disease like slow heart rate, worsening heart failure, heart block, sick sinus syndrome or Raynaud's disease -kidney disease -liver disease -lung or breathing disease, like asthma or emphysema -pheochromocytoma -thyroid disease -an unusual or allergic reaction to metoprolol, other beta-blockers, medicines, foods, dyes, or preservatives -pregnant or trying to get pregnant -breast-feeding How should I use this medicine? Take this medicine by mouth with a drink of water. Follow the directions on the prescription label. Take this medicine immediately after meals. Take your doses at regular intervals. Do not take more medicine than directed. Do not stop taking this medicine suddenly. This could lead to serious heart-related effects. Talk to your pediatrician regarding the use of this medicine in children. Special care may be needed. Overdosage: If you think you have taken too much of this medicine contact a poison control center or emergency room at once. NOTE: This medicine is only for you. Do not share this medicine with others. What if I miss a dose? If you miss a dose, take it as soon as you can. If it is almost time for your next dose, take only that dose. Do not take double or extra doses. What may interact with this medicine? This medicine may interact  with the following medications: -certain medicines for blood pressure, heart disease, irregular heart beat -certain medicines for depression like monoamine oxidase (MAO) inhibitors, fluoxetine, or paroxetine -clonidine -dobutamine -epinephrine -isoproterenol -reserpine This list may not describe all possible interactions. Give your health care provider a list of all the medicines, herbs, non-prescription drugs, or dietary supplements you use. Also tell them if you smoke, drink alcohol, or use illegal drugs. Some items may interact with your medicine. What should I watch for while using this medicine? Visit your doctor or health care professional for regular check ups. Contact your doctor right away if your symptoms worsen. Check your blood pressure and pulse rate regularly. Ask your health care professional what your blood pressure and pulse rate should be, and when you should contact them. You may get drowsy or dizzy. Do not drive, use machinery, or do anything that needs mental alertness until you know how this medicine affects you. Do not sit or stand up quickly, especially if you are an older patient. This reduces the risk of dizzy or fainting spells. Contact your doctor if these symptoms continue. Alcohol may interfere with the effect of this medicine. Avoid alcoholic drinks. What side effects may I notice from receiving this medicine? Side effects that you should report to your doctor or health care professional as soon as possible: -allergic reactions like skin rash, itching or hives -cold or numb hands or feet -depression -difficulty breathing -faint -fever with sore throat -irregular heartbeat, chest pain -rapid weight gain -swollen legs or ankles Side effects that usually do not require medical attention (report to your doctor or health care professional   if they continue or are bothersome): -anxiety or nervousness -change in sex drive or performance -dry  skin -headache -nightmares or trouble sleeping -short term memory loss -stomach upset or diarrhea -unusually tired This list may not describe all possible side effects. Call your doctor for medical advice about side effects. You may report side effects to FDA at 1-800-FDA-1088. Where should I keep my medicine? Keep out of the reach of children. Store at room temperature between 15 and 30 degrees C (59 and 86 degrees F). Throw away any unused medicine after the expiration date. NOTE: This sheet is a summary. It may not cover all possible information. If you have questions about this medicine, talk to your doctor, pharmacist, or health care provider.  2018 Elsevier/Gold Standard (2012-10-11 14:40:36) Hypertension Hypertension is another name for high blood pressure. High blood pressure forces your heart to work harder to pump blood. This can cause problems over time. There are two numbers in a blood pressure reading. There is a top number (systolic) over a bottom number (diastolic). It is best to have a blood pressure below 120/80. Healthy choices can help lower your blood pressure. You may need medicine to help lower your blood pressure if:  Your blood pressure cannot be lowered with healthy choices.  Your blood pressure is higher than 130/80.  Follow these instructions at home: Eating and drinking  If directed, follow the DASH eating plan. This diet includes: ? Filling half of your plate at each meal with fruits and vegetables. ? Filling one quarter of your plate at each meal with whole grains. Whole grains include whole wheat pasta, brown rice, and whole grain bread. ? Eating or drinking low-fat dairy products, such as skim milk or low-fat yogurt. ? Filling one quarter of your plate at each meal with low-fat (lean) proteins. Low-fat proteins include fish, skinless chicken, eggs, beans, and tofu. ? Avoiding fatty meat, cured and processed meat, or chicken with skin. ? Avoiding premade  or processed food.  Eat less than 1,500 mg of salt (sodium) a day.  Limit alcohol use to no more than 1 drink a day for nonpregnant women and 2 drinks a day for men. One drink equals 12 oz of beer, 5 oz of wine, or 1 oz of hard liquor. Lifestyle  Work with your doctor to stay at a healthy weight or to lose weight. Ask your doctor what the best weight is for you.  Get at least 30 minutes of exercise that causes your heart to beat faster (aerobic exercise) most days of the week. This may include walking, swimming, or biking.  Get at least 30 minutes of exercise that strengthens your muscles (resistance exercise) at least 3 days a week. This may include lifting weights or pilates.  Do not use any products that contain nicotine or tobacco. This includes cigarettes and e-cigarettes. If you need help quitting, ask your doctor.  Check your blood pressure at home as told by your doctor.  Keep all follow-up visits as told by your doctor. This is important. Medicines  Take over-the-counter and prescription medicines only as told by your doctor. Follow directions carefully.  Do not skip doses of blood pressure medicine. The medicine does not work as well if you skip doses. Skipping doses also puts you at risk for problems.  Ask your doctor about side effects or reactions to medicines that you should watch for. Contact a doctor if:  You think you are having a reaction to the medicine you are  taking.  You have headaches that keep coming back (recurring).  You feel dizzy.  You have swelling in your ankles.  You have trouble with your vision. Get help right away if:  You get a very bad headache.  You start to feel confused.  You feel weak or numb.  You feel faint.  You get very bad pain in your: ? Chest. ? Belly (abdomen).  You throw up (vomit) more than once.  You have trouble breathing. Summary  Hypertension is another name for high blood pressure.  Making healthy choices  can help lower blood pressure. If your blood pressure cannot be controlled with healthy choices, you may need to take medicine. This information is not intended to replace advice given to you by your health care provider. Make sure you discuss any questions you have with your health care provider. Document Released: 07/26/2007 Document Revised: 01/05/2016 Document Reviewed: 01/05/2016 Elsevier Interactive Patient Education  [employer].

## 2017-12-14 NOTE — Progress Notes (Signed)
Patient [facility]  Provider: Lanae Boast, FNP   Hypertension Follow Up Visit  SUBJECTIVE:  Peter [name] is a 37 y.o. male who  has a past medical history of Alcoholism ([place]), Hypertension, and Substance abuse ([place]). .   New concerns:  Patient presents for follow up on HTN. He states that he was unable to purchase the propranolol. He has been taking all other medications. He states that he has not been able to sleep and is upset today due to a friend being in the hospital.   Current Outpatient Medications  Medication Sig Dispense Refill  . amLODipine (NORVASC) 10 MG tablet Take 1 tablet (10 mg total) by mouth daily. 90 tablet 1  . cloNIDine (CATAPRES) 0.1 MG tablet Take 1 tablet (0.1 mg total) by mouth 3 (three) times daily. 90 tablet 0  . folic acid (FOLVITE) 1 MG tablet Take 1 tablet (1 mg total) by mouth daily. 30 tablet 0  . ibuprofen (ADVIL,MOTRIN) 200 MG tablet Take 200 mg by mouth every 6 (six) hours as needed (for pain or headaches).    . losartan (COZAAR) 100 MG tablet Take 1 tablet (100 mg total) by mouth daily. 90 tablet 1  . multivitamin (ONE-A-DAY MEN'S) TABS tablet Take 1 tablet by mouth daily.    . ondansetron (ZOFRAN ODT) 4 MG disintegrating tablet Take 1 tablet (4 mg total) by mouth every 8 (eight) hours as needed for nausea. 10 tablet 0  . oxyCODONE-acetaminophen (PERCOCET) 5-325 MG tablet Take 1 tablet by mouth every 4 (four) hours as needed. 20 tablet 0  . thiamine (VITAMIN B-1) 100 MG tablet Take 1 tablet (100 mg total) by mouth daily. 30 tablet 0  . venlafaxine XR (EFFEXOR XR) 37.5 MG 24 hr capsule Take 1 capsule (37.5 mg total) by mouth daily with breakfast. 90 capsule 0  . fenofibrate (TRICOR) 48 MG tablet Take 1 tablet (48 mg total) by mouth daily. (Patient not taking: Reported on 11/28/2017) 30 tablet 11  . propranolol ER (INDERAL LA) 60 MG 24 hr capsule Take 1 capsule (60 mg total) by mouth daily. (Patient not  taking: Reported on 12/14/2017) 90 capsule 1   No current facility-administered medications for this visit.     Recent Results (from the past 2160 hour(s))  Comprehensive metabolic panel     Status: Abnormal   Collection Time: 11/28/17  3:32 PM  Result Value Ref Range   Sodium 136 135 - 145 mmol/L   Potassium 4.0 3.5 - 5.1 mmol/L   Chloride 102 98 - 111 mmol/L   CO2 23 22 - 32 mmol/L   Glucose, Bld 130 (H) 70 - 99 mg/dL   BUN 10 6 - 20 mg/dL   Creatinine, Ser 0.97 0.61 - 1.24 mg/dL   Calcium 9.0 8.9 - 10.3 mg/dL   Total Protein 7.3 6.5 - 8.1 g/dL   Albumin 3.2 (L) 3.5 - 5.0 g/dL   AST 24 15 - 41 U/L   ALT 33 0 - 44 U/L   Alkaline Phosphatase 63 38 - 126 U/L   Total Bilirubin 0.5 0.3 - 1.2 mg/dL   GFR calc non Af Amer >60 >60 mL/min   GFR calc Af Amer >60 >60 mL/min    Comment: (NOTE) The eGFR has been calculated using the CKD EPI equation. This calculation has not been validated in all clinical situations. eGFR's persistently <60 mL/min signify possible Chronic Kidney Disease.    Anion gap 11 5 - 15  Comment: Performed at [facility], [place] [address] [place], [place] [address]  CBC with Differential     Status: Abnormal   Collection Time: 11/28/17  3:32 PM  Result Value Ref Range   WBC 13.7 (H) 4.0 - 10.5 K/uL   RBC 4.68 4.22 - 5.81 MIL/uL   Hemoglobin 14.9 13.0 - 17.0 g/dL   HCT 44.1 39.0 - 52.0 %   MCV 94.2 80.0 - 100.0 fL   MCH 31.8 26.0 - 34.0 pg   MCHC 33.8 30.0 - 36.0 g/dL   RDW 12.7 11.5 - 15.5 %   Platelets 258 150 - 400 K/uL   nRBC 0.0 0.0 - 0.2 %   Neutrophils Relative % 78 %   Neutro Abs 10.9 (H) 1.7 - 7.7 K/uL   Lymphocytes Relative 8 %   Lymphs Abs 1.2 0.7 - 4.0 K/uL   Monocytes Relative 10 %   Monocytes Absolute 1.3 (H) 0.1 - 1.0 K/uL   Eosinophils Relative 1 %   Eosinophils Absolute 0.1 0.0 - 0.5 K/uL   Basophils Relative 1 %   Basophils Absolute 0.1 0.0 - 0.1 K/uL   Immature Granulocytes 2 %   Abs Immature Granulocytes 0.20 (H) 0.00 -  0.07 K/uL    Comment: Performed at [facility] [address], [place], [place] [address]  Lipase, blood     Status: Abnormal   Collection Time: 11/28/17  3:32 PM  Result Value Ref Range   Lipase 52 (H) 11 - 51 U/L    Comment: Performed at [facility], [place] [address], [place], [place] [address]  I-stat troponin, ED     Status: None   Collection Time: 11/28/17  3:43 PM  Result Value Ref Range   Troponin i, poc 0.00 0.00 - 0.08 ng/mL   Comment 3            Comment: Due to the release kinetics of cTnI, a negative result within the first hours of the onset of symptoms does not rule out myocardial infarction with certainty. If myocardial infarction is still suspected, repeat the test at appropriate intervals.   Brain natriuretic peptide     Status: None   Collection Time: 11/28/17  9:09 PM  Result Value Ref Range   B Natriuretic Peptide 36.4 0.0 - 100.0 pg/mL    Comment: Performed at [facility], [place] [address], [place], [place] [address]  D-dimer, quantitative (not at [facility])     Status: Abnormal   Collection Time: 11/28/17  9:09 PM  Result Value Ref Range   D-Dimer, Quant 4.39 (H) 0.00 - 0.50 ug/mL-FEU    Comment: (NOTE) At the manufacturer cut-off of 0.50 ug/mL FEU, this assay has been documented to exclude PE with a sensitivity and negative predictive value of 97 to 99%.  At this time, this assay has not been approved by the FDA to exclude DVT/VTE. Results should be correlated with clinical presentation. Performed at [facility], [place] [address], [place],  [address]   I-stat troponin, ED     Status: None   Collection Time: 11/28/17  9:44 PM  Result Value Ref Range   Troponin i, poc 0.01 0.00 - 0.08 ng/mL   Comment 3            Comment: Due to the release kinetics of cTnI, a negative result within the first hours of the onset of symptoms does not rule out myocardial infarction with certainty. If myocardial  infarction is still  suspected, repeat the test at appropriate intervals.   CBC with Differential     Status: Abnormal   Collection Time: 12/03/17  4:07 PM  Result Value Ref Range   WBC 12.2 (H) 3.4 - 10.8 x10E3/uL   RBC 5.05 4.14 - 5.80 x10E6/uL   Hemoglobin 15.9 13.0 - 17.7 g/dL   Hematocrit 45.8 37.5 - 51.0 %   MCV 91 79 - 97 fL   MCH 31.5 26.6 - 33.0 pg   MCHC 34.7 31.5 - 35.7 g/dL   RDW 12.6 12.3 - 15.4 %   Platelets 389 150 - 450 x10E3/uL   Neutrophils 73 Not Estab. %   Lymphs 16 Not Estab. %   Monocytes 6 Not Estab. %   Eos 1 Not Estab. %   Basos 1 Not Estab. %   Neutrophils Absolute 9.0 (H) 1.4 - 7.0 x10E3/uL   Lymphocytes Absolute 1.9 0.7 - 3.1 x10E3/uL   Monocytes Absolute 0.7 0.1 - 0.9 x10E3/uL   EOS (ABSOLUTE) 0.2 0.0 - 0.4 x10E3/uL   Basophils Absolute 0.1 0.0 - 0.2 x10E3/uL   Immature Granulocytes 3 Not Estab. %   Immature Grans (Abs) 0.3 (H) 0.0 - 0.1 x10E3/uL    Comment: (An elevated percentage of Immature Granulocytes has not been found to be clinically significant as a sole clinical predictor of disease. Does NOT include bands or blast cells.  Pregnancy associated physiological leukocytosis may also show increased immature granulocytes without clinical significance.)   Comprehensive metabolic panel     Status: Abnormal   Collection Time: 12/03/17  4:07 PM  Result Value Ref Range   Glucose 128 (H) 65 - 99 mg/dL   BUN 17 6 - 20 mg/dL   Creatinine, Ser 1.08 0.76 - 1.27 mg/dL   GFR calc non Af Amer 88 >59 mL/min/1.73   GFR calc Af Amer 102 >59 mL/min/1.73   BUN/Creatinine Ratio 16 9 - 20   Sodium 138 134 - 144 mmol/L   Potassium 4.4 3.5 - 5.2 mmol/L   Chloride 97 96 - 106 mmol/L   CO2 23 20 - 29 mmol/L   Calcium 9.8 8.7 - 10.2 mg/dL   Total Protein 7.6 6.0 - 8.5 g/dL   Albumin 4.3 3.5 - 5.5 g/dL   Globulin, Total 3.3 1.5 - 4.5 g/dL   Albumin/Globulin Ratio 1.3 1.2 - 2.2   Bilirubin Total <0.2 0.0 - 1.2 mg/dL   Alkaline Phosphatase 81 39 - 117 IU/L   AST 19 0 - 40 IU/L    ALT 28 0 - 44 IU/L  Lipase     Status: Abnormal   Collection Time: 12/03/17  4:07 PM  Result Value Ref Range   Lipase 89 (H) 13 - 78 U/L  Sedimentation Rate     Status: Abnormal   Collection Time: 12/03/17  4:07 PM  Result Value Ref Range   Sed Rate 23 (H) 0 - 15 mm/hr  C-reactive protein     Status: Abnormal   Collection Time: 12/03/17  4:07 PM  Result Value Ref Range   CRP 31 (H) 0 - 10 mg/L  Amylase     Status: None   Collection Time: 12/03/17  4:07 PM  Result Value Ref Range   Amylase 60 31 - 124 U/L    Hypertension ROS: Review of Systems  Constitutional: Negative.   HENT: Negative.   Eyes: Negative.   Respiratory: Negative.   Cardiovascular: Negative.   Gastrointestinal: Negative.   Genitourinary: Negative.   Musculoskeletal:  Negative.   Skin: Negative.   Neurological: Negative.   Psychiatric/Behavioral: Negative.      OBJECTIVE:   BP (!) 160/104 (BP Location: Left Arm, Patient Position: Sitting, Cuff Size: Normal) Comment: manually  Pulse (!) 128   Temp 98.2 F (36.8 C) (Oral)   Resp 16   Ht 5' 11" (1.803 m)   Wt 221 lb (100.2 kg)   SpO2 98%   BMI 30.82 kg/m   Physical Exam  Constitutional: He is oriented to person, place, and time and well-developed, well-nourished, and in no distress. No distress.  HENT:  Head: Normocephalic and atraumatic.  Eyes: Pupils are equal, round, and reactive to light. Conjunctivae and EOM are normal.  Neck: Normal range of motion. Neck supple.  Cardiovascular: Regular rhythm, normal heart sounds, intact distal pulses and normal pulses. Tachycardia present. Exam reveals no gallop and no friction rub.  No murmur heard. Pulmonary/Chest: Effort normal and breath sounds normal. No respiratory distress. He has no wheezes.  Abdominal: Soft. Bowel sounds are normal. There is no tenderness.  Musculoskeletal: Normal range of motion. He exhibits no edema or tenderness.  Lymphadenopathy:    He has no cervical adenopathy.  Neurological:  He is alert and oriented to person, place, and time. Gait normal.  Skin: Skin is warm and dry.  Psychiatric: Mood, memory, affect and judgment normal.  Nursing note and vitals reviewed.    ASSESSMENT/PLAN:   1. Essential hypertension Will continue with current medications as previously prescribed. Will start on metoprolol 42m BID.  EKG- Sinus tachycardia T wave abnormality, consider inferior ischemia Abnormal ECG- no changes since 11/29/2017 EKG.  - metoprolol tartrate (LOPRESSOR) 25 MG tablet; Take 1 tablet (25 mg total) by mouth 2 (two) times daily.  Dispense: 60 tablet; Refill: 2 - cloNIDine (CATAPRES) tablet 0.2 mg   The patient is asked to make an attempt to improve diet and exercise patterns to aid in medical management of this problem.  Return to care in 2 weeks for BP check. Patient verbalized understanding and agreed with plan of care.    Ms. ADoug Sou DNathaneil Canary FNP-BC Patient CNorth [facility] MonteGroup [address]A[place]rent[address] [place] [phone]

## 2018-01-11 ENCOUNTER — Encounter: Payer: Self-pay | Admitting: Family Medicine

## 2018-01-11 ENCOUNTER — Ambulatory Visit (INDEPENDENT_AMBULATORY_CARE_PROVIDER_SITE_OTHER): Payer: Self-pay | Admitting: Family Medicine

## 2018-01-11 VITALS — BP 145/89 | HR 125 | Temp 98.9°F | Resp 18 | Ht 71.0 in | Wt 224.0 lb

## 2018-01-11 DIAGNOSIS — F32A Depression, unspecified: Secondary | ICD-10-CM

## 2018-01-11 DIAGNOSIS — F329 Major depressive disorder, single episode, unspecified: Secondary | ICD-10-CM

## 2018-01-11 DIAGNOSIS — I10 Essential (primary) hypertension: Secondary | ICD-10-CM

## 2018-01-11 MED ORDER — CLONIDINE HCL 0.1 MG PO TABS: 0.1000 mg | ORAL_TABLET | Freq: Two times a day (BID) | ORAL | 1 refills | Status: DC

## 2018-01-11 MED ORDER — BUSPIRONE HCL 7.5 MG PO TABS: 7.5000 mg | ORAL_TABLET | Freq: Two times a day (BID) | ORAL | 2 refills | Status: DC

## 2018-01-11 MED ORDER — VENLAFAXINE HCL ER 37.5 MG PO CP24: 37.5000 mg | ORAL_CAPSULE | Freq: Every day | ORAL | 0 refills | Status: DC

## 2018-01-11 NOTE — Patient Instructions (Signed)
Clonidine tablets What is this medicine? CLONIDINE (KLOE ni deen) is used to treat high blood pressure. This medicine may be used for other purposes; ask your health care provider or pharmacist if you have questions. COMMON BRAND NAME(S): Catapres What should I tell my health care provider before I take this medicine? They need to know if you have any of these conditions: -kidney disease -an unusual or allergic reaction to clonidine, other medicines, foods, dyes, or preservatives -pregnant or trying to get pregnant -breast-feeding How should I use this medicine? Take this medicine by mouth with a glass of water. Follow the directions on the prescription label. Take your doses at regular intervals. Do not take your medicine more often than directed. Do not suddenly stop taking this medicine. You must gradually reduce the dose or you may get a dangerous increase in blood pressure. Ask your doctor or health care professional for advice. Talk to your pediatrician regarding the use of this medicine in children. Special care may be needed. Overdosage: If you think you have taken too much of this medicine contact a poison control center or emergency room at once. NOTE: This medicine is only for you. Do not share this medicine with others. What if I miss a dose? If you miss a dose, take it as soon as you can. If it is almost time for your next dose, take only that dose. Do not take double or extra doses. What may interact with this medicine? Do not take this medicine with any of the following medications: -MAOIs like Carbex, Eldepryl, Marplan, Nardil, and Parnate This medicine may also interact with the following medications: -barbiturate medicines for inducing sleep or treating seizures like phenobarbital -certain medicines for blood pressure, heart disease, irregular heart beat -certain medicines for depression, anxiety, or psychotic disturbances -prescription pain medicines This list may not  describe all possible interactions. Give your health care provider a list of all the medicines, herbs, non-prescription drugs, or dietary supplements you use. Also tell them if you smoke, drink alcohol, or use illegal drugs. Some items may interact with your medicine. What should I watch for while using this medicine? Visit your doctor or health care professional for regular checks on your progress. Check your heart rate and blood pressure regularly while you are taking this medicine. Ask your doctor or health care professional what your heart rate should be and when you should contact him or her. You may get drowsy or dizzy. Do not drive, use machinery, or do anything that needs mental alertness until you know how this medicine affects you. To avoid dizzy or fainting spells, do not stand or sit up quickly, especially if you are an older person. Alcohol can make you more drowsy and dizzy. Avoid alcoholic drinks. Your mouth may get dry. Chewing sugarless gum or sucking hard candy, and drinking plenty of water will help. Do not treat yourself for coughs, colds, or pain while you are taking this medicine without asking your doctor or health care professional for advice. Some ingredients may increase your blood pressure. If you are going to have surgery tell your doctor or health care professional that you are taking this medicine. What side effects may I notice from receiving this medicine? Side effects that you should report to your doctor or health care professional as soon as possible: -allergic reactions like skin rash, itching or hives, swelling of the face, lips, or tongue -anxiety, nervousness -chest pain -depression -fast, irregular heartbeat -swelling of feet or legs -unusually   weak or tired Side effects that usually do not require medical attention (report to your doctor or health care professional if they continue or are bothersome): -change in sex drive or  performance -constipation -headache This list may not describe all possible side effects. Call your doctor for medical advice about side effects. You may report side effects to FDA at 1-800-FDA-1088. Where should I keep my medicine? Keep out of the reach of children. Store at room temperature between 15 and 30 degrees C (59 and 86 degrees F). Protect from light. Keep container tightly closed. Throw away any unused medicine after the expiration date. NOTE: This sheet is a summary. It may not cover all possible information. If you have questions about this medicine, talk to your doctor, pharmacist, or health care provider.  2018 Elsevier/Gold Standard (2010-08-03 13:01:28)  

## 2018-01-11 NOTE — Progress Notes (Signed)
  [facility]   Progress Note: General Provider: Mike GipAndre Terianne Thaker, FNP  SUBJECTIVE:   Peter [name] is a 37 y.o. male who  has a past medical history of Alcoholism (HCC), Hypertension, and Substance abuse (HCC).. Patient presents today for Hypertension Patient presents for follow up on HTN today. Patient states that he is doing well on his current regimen. Denies dizziness, SOB, chest pain and  leg swelling. He states that he is managing his stress better.   Review of Systems  Constitutional: Negative.   HENT: Negative.   Eyes: Negative.   Respiratory: Negative.   Cardiovascular: Negative.   Gastrointestinal: Negative.   Genitourinary: Negative.   Musculoskeletal: Negative.   Skin: Negative.   Neurological: Negative.   Psychiatric/Behavioral: Negative.      OBJECTIVE: BP (!) 145/89 (BP Location: Left Arm, Patient Position: Sitting, Cuff Size: Large)   Pulse (!) 125   Temp 98.9 F (37.2 C) (Oral)   Resp 18   Ht 5\' 11"  (1.803 m)   Wt 224 lb (101.6 kg)   SpO2 96%   BMI 31.24 kg/m   Wt Readings from Last 3 Encounters:  01/11/18 224 lb (101.6 kg)  12/14/17 221 lb (100.2 kg)  12/03/17 221 lb (100.2 kg)     Physical Exam  Constitutional: He is oriented to person, place, and time. He appears well-developed and well-nourished. No distress.  HENT:  Head: Normocephalic and atraumatic.  Eyes: Pupils are equal, round, and reactive to light. Conjunctivae and EOM are normal.  Neck: Normal range of motion.  Cardiovascular: Normal rate, regular rhythm, normal heart sounds and intact distal pulses.  Pulmonary/Chest: Effort normal and breath sounds normal. No respiratory distress.  Musculoskeletal: Normal range of motion.  Neurological: He is alert and oriented to person, place, and time.  Skin: Skin is warm and dry.  Psychiatric: He has a normal mood and affect. His behavior is normal. Judgment and thought content normal.  Nursing  note and vitals reviewed.   ASSESSMENT/PLAN:  1. Essential hypertension We discussed weaning off clonidine. Patient is doing well with the decrease from 0.2mg  to 0.1mg  TID. He reports forgetting to take the 2nd dose. Will decrease to 0.1mg  BID for the next few months. He is to continue to monitor his BP daily.  - cloNIDine (CATAPRES) 0.1 MG tablet; Take 1 tablet (0.1 mg total) by mouth 2 (two) times daily.  Dispense: 60 tablet; Refill: 1  2. Depression, unspecified depression type Continue with current medications. We discussed the effects of Effexor   - venlafaxine XR (EFFEXOR XR) 37.5 MG 24 hr capsule; Take 1 capsule (37.5 mg total) by mouth daily with breakfast.  Dispense: 90 capsule; Refill: 0 - busPIRone (BUSPAR) 7.5 MG tablet; Take 1 tablet (7.5 mg total) by mouth 2 (two) times daily.  Dispense: 60 tablet; Refill: 2       The patient was given clear instructions to go to ER or return to medical center if symptoms do not improve, worsen or new problems develop. The patient verbalized understanding and agreed with plan of care.   Ms. Freda Jacksonndr L. Riley Lamouglas, FNP-BC [facility] [address]  , [place] [id number] [phone]     This note has been created with Dragon speech recognition software and smart phrase technology. Any transcriptional errors are unintentional.

## 2018-01-21 ENCOUNTER — Emergency Department (HOSPITAL_COMMUNITY): Payer: Self-pay

## 2018-01-21 ENCOUNTER — Inpatient Hospital Stay (HOSPITAL_COMMUNITY)
Admission: EM | Admit: 2018-01-21 | Discharge: 2018-01-23 | DRG: 493 | Disposition: A | Discharge status: Home / Self Care | Payer: Self-pay | Attending: Orthopedic Surgery | Admitting: Orthopedic Surgery

## 2018-01-21 DIAGNOSIS — W010XXA Fall on same level from slipping, tripping and stumbling without subsequent striking against object, initial encounter: Secondary | ICD-10-CM | POA: Diagnosis present

## 2018-01-21 DIAGNOSIS — Z419 Encounter for procedure for purposes other than remedying health state, unspecified: Secondary | ICD-10-CM

## 2018-01-21 DIAGNOSIS — S82201A Unspecified fracture of shaft of right tibia, initial encounter for closed fracture: Secondary | ICD-10-CM | POA: Diagnosis present

## 2018-01-21 DIAGNOSIS — S0990XA Unspecified injury of head, initial encounter: Secondary | ICD-10-CM | POA: Diagnosis present

## 2018-01-21 DIAGNOSIS — S82831A Other fracture of upper and lower end of right fibula, initial encounter for closed fracture: Secondary | ICD-10-CM | POA: Diagnosis present

## 2018-01-21 DIAGNOSIS — S82391A Other fracture of lower end of right tibia, initial encounter for closed fracture: Principal | ICD-10-CM | POA: Diagnosis present

## 2018-01-21 DIAGNOSIS — Z79899 Other long term (current) drug therapy: Secondary | ICD-10-CM

## 2018-01-21 DIAGNOSIS — F10229 Alcohol dependence with intoxication, unspecified: Secondary | ICD-10-CM | POA: Diagnosis present

## 2018-01-21 DIAGNOSIS — F1120 Opioid dependence, uncomplicated: Secondary | ICD-10-CM | POA: Diagnosis present

## 2018-01-21 DIAGNOSIS — I10 Essential (primary) hypertension: Secondary | ICD-10-CM | POA: Diagnosis present

## 2018-01-21 DIAGNOSIS — E669 Obesity, unspecified: Secondary | ICD-10-CM | POA: Diagnosis present

## 2018-01-21 DIAGNOSIS — Z6831 Body mass index (BMI) 31.0-31.9, adult: Secondary | ICD-10-CM

## 2018-01-21 DIAGNOSIS — K219 Gastro-esophageal reflux disease without esophagitis: Secondary | ICD-10-CM | POA: Diagnosis present

## 2018-01-21 DIAGNOSIS — F17210 Nicotine dependence, cigarettes, uncomplicated: Secondary | ICD-10-CM | POA: Diagnosis present

## 2018-01-21 DIAGNOSIS — S82301A Unspecified fracture of lower end of right tibia, initial encounter for closed fracture: Secondary | ICD-10-CM

## 2018-01-21 DIAGNOSIS — Y9289 Other specified places as the place of occurrence of the external cause: Secondary | ICD-10-CM

## 2018-01-21 LAB — BASIC METABOLIC PANEL
Anion gap: 16 — ABNORMAL HIGH (ref 5–15)
BUN: 15 mg/dL (ref 6–20)
CO2: 20 mmol/L — ABNORMAL LOW (ref 22–32)
Calcium: 9.1 mg/dL (ref 8.9–10.3)
Chloride: 98 mmol/L (ref 98–111)
Creatinine, Ser: 1.24 mg/dL (ref 0.61–1.24)
GFR calc Af Amer: >60 mL/min (ref >60)
GFR calc non Af Amer: >60 mL/min (ref >60)
Glucose, Bld: 129 mg/dL — ABNORMAL HIGH (ref 70–99)
POTASSIUM: 4.1 mmol/L (ref 3.5–5.1)
Sodium: 134 mmol/L — ABNORMAL LOW (ref 135–145)

## 2018-01-21 LAB — CBC WITH DIFFERENTIAL/PLATELET
Abs Immature Granulocytes: 0.2 10*3/uL — ABNORMAL HIGH (ref 0.00–0.07)
BASOS PCT: 1 %
Basophils Absolute: 0.1 10*3/uL (ref 0.0–0.1)
Eosinophils Absolute: 0.1 10*3/uL (ref 0.0–0.5)
Eosinophils Relative: 0 %
HCT: 51.5 % (ref 39.0–52.0)
Hemoglobin: 17.2 g/dL — ABNORMAL HIGH (ref 13.0–17.0)
Immature Granulocytes: 1 %
Lymphocytes Relative: 9 %
Lymphs Abs: 1.7 10*3/uL (ref 0.7–4.0)
MCH: 30.8 pg (ref 26.0–34.0)
MCHC: 33.4 g/dL (ref 30.0–36.0)
MCV: 92.1 fL (ref 80.0–100.0)
Monocytes Absolute: 0.8 10*3/uL (ref 0.1–1.0)
Monocytes Relative: 4 %
Neutro Abs: 16.2 10*3/uL — ABNORMAL HIGH (ref 1.7–7.7)
Neutrophils Relative %: 85 %
Platelets: 337 10*3/uL (ref 150–400)
RBC: 5.59 MIL/uL (ref 4.22–5.81)
RDW: 12.5 % (ref 11.5–15.5)
WBC: 19.1 10*3/uL — ABNORMAL HIGH (ref 4.0–10.5)
nRBC: 0 % (ref 0.0–0.2)

## 2018-01-21 LAB — ETHANOL: Alcohol, Ethyl (B): 267 mg/dL — ABNORMAL HIGH (ref <10)

## 2018-01-21 IMAGING — CT CT CERVICAL SPINE W/O CM
5 of 8 series · 12 of 33 positions shown, 13 images · non-contrast
Comparison: None.

CLINICAL DATA: Patient found down.

EXAM:
CT HEAD WITHOUT CONTRAST
CT CERVICAL SPINE WITHOUT CONTRAST
TECHNIQUE: Multidetector CT imaging of the head and cervical spine was
performed following the standard protocol without intravenous
contrast. Multiplanar CT image reconstructions of the cervical spine
were also generated.

[Series 5: head bone · axial · 0.46mm/px · z∈[-69,-13]mm · 2 of 86 slices shown]
[im 29/86  bone]
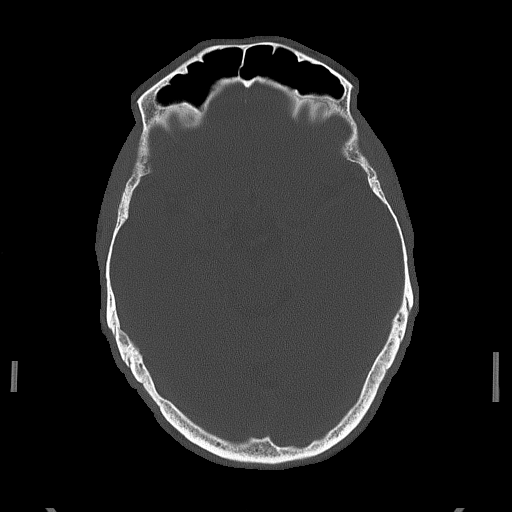
[im 57/86  bone]
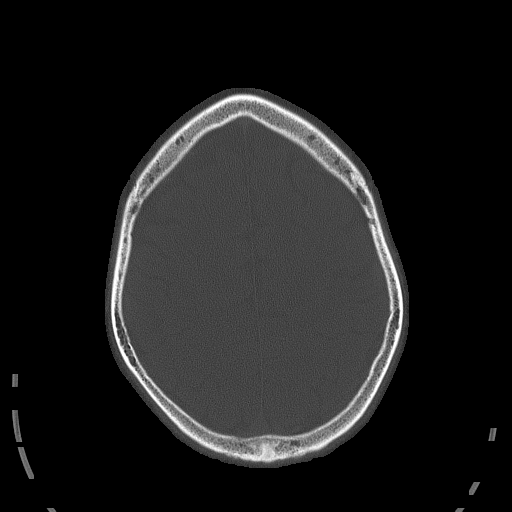

[Series 8: c_spine 2.0 st · axial · 0.32mm/px · z∈[-243,-139]mm · 3 of 104 slices shown, 4 images]
[im 26/104  soft-tissue]
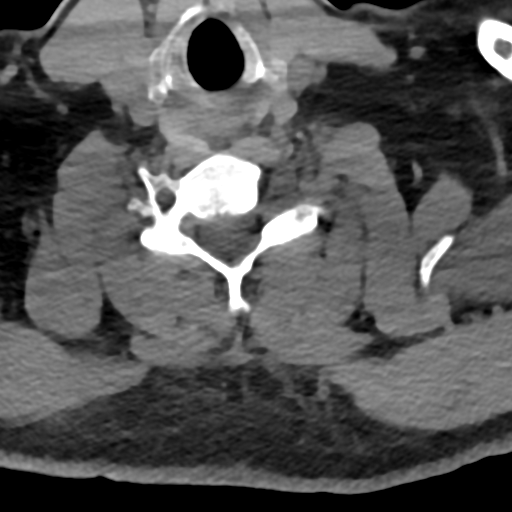
[im 26/104  bone]
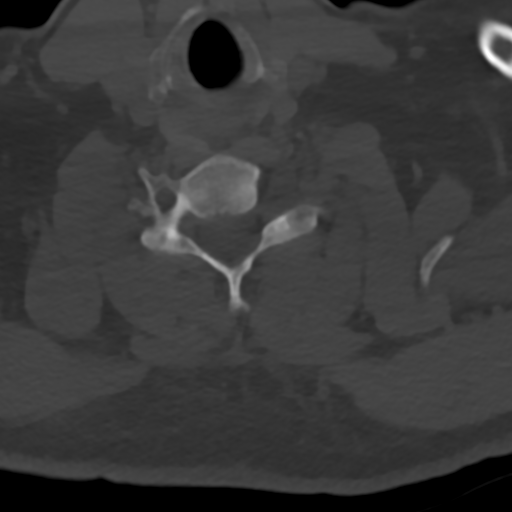
[im 52/104  bone]
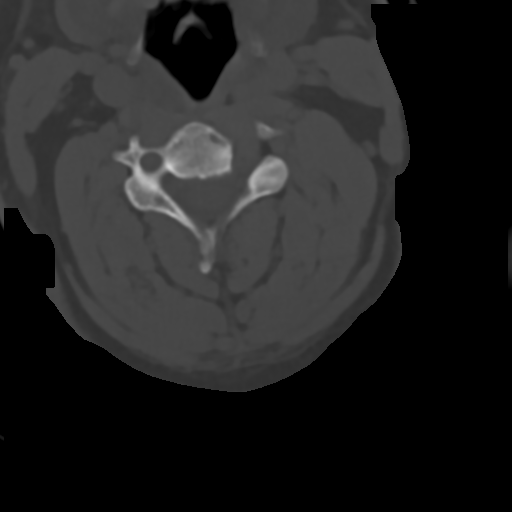
[im 78/104  bone]
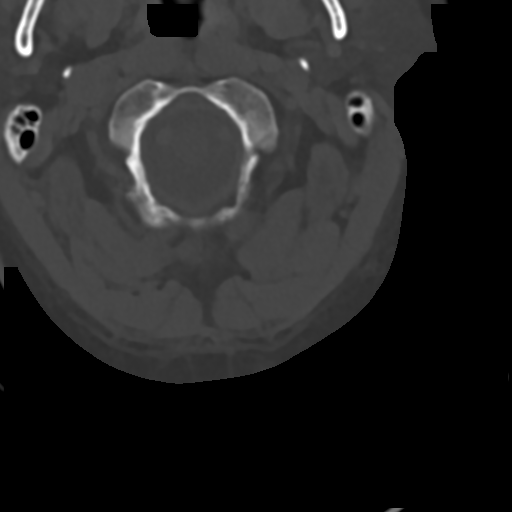

[Series 12: c_spine 2.0 sag bone · sagittal · 0.27mm/px · 4 of 61 slices shown]
[im 13/61  bone]
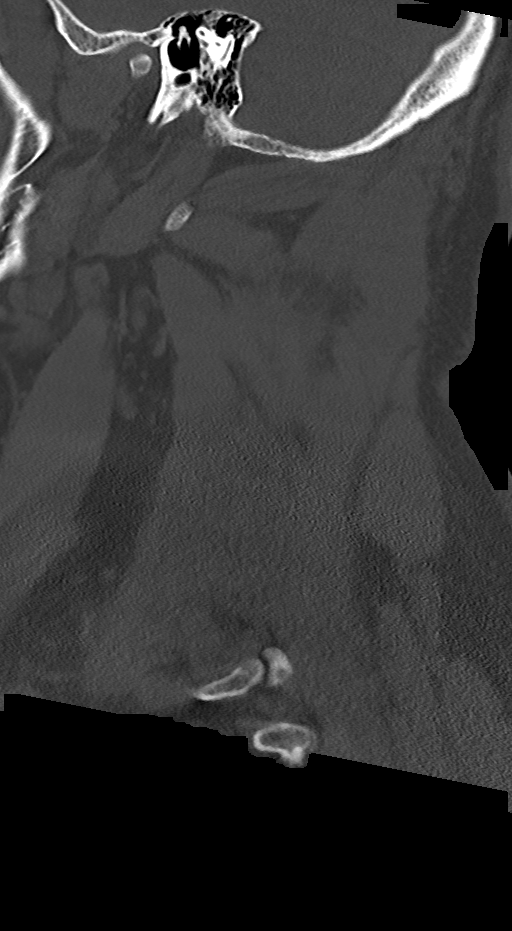
[im 25/61  bone]
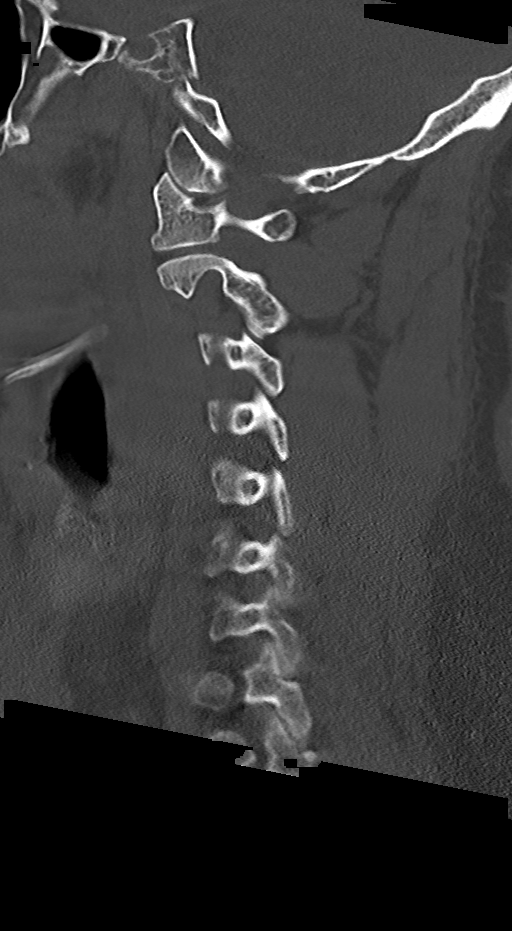
[im 37/61  bone]
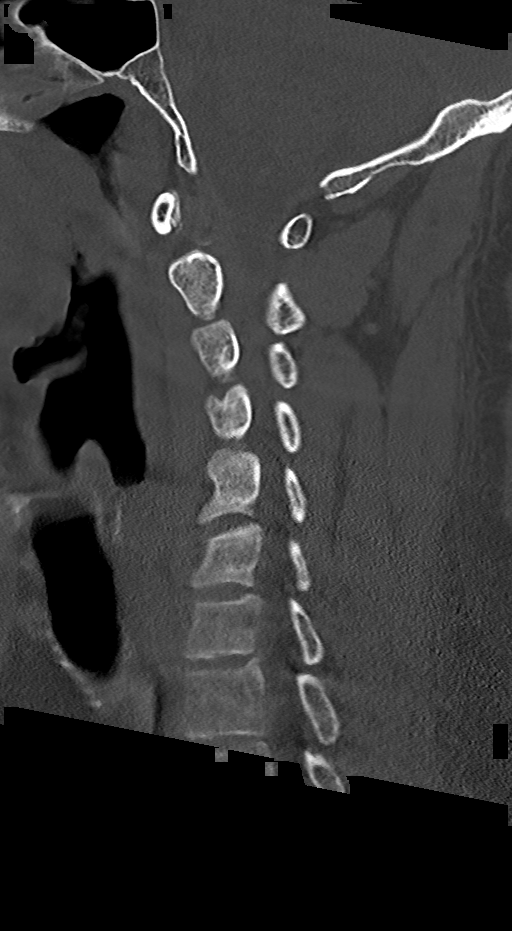
[im 49/61  bone]
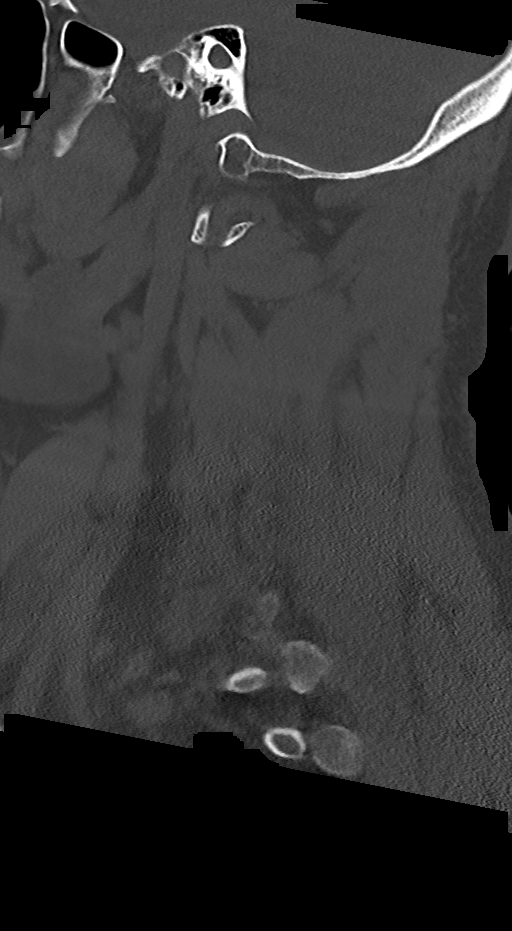

[Series 13: c_spine 2.0 cor bone · coronal · 0.27mm/px · 1 of 61 slices shown]
[im 31/61  bone]
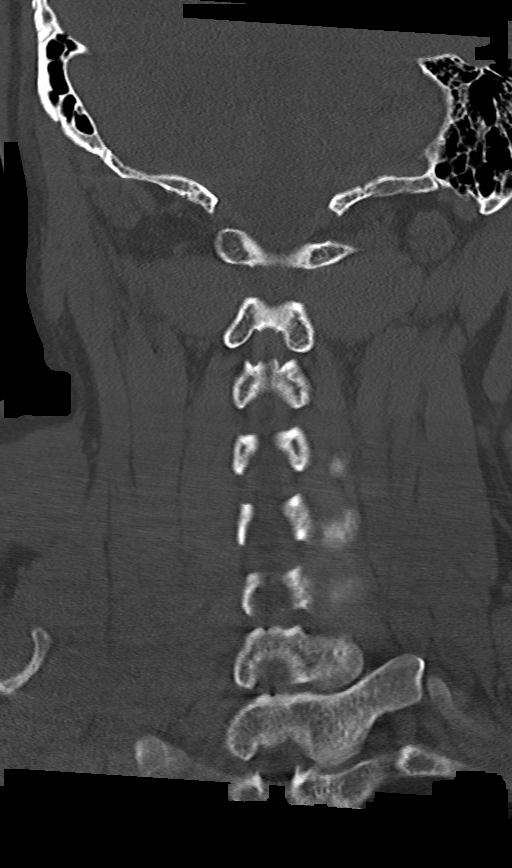

[Series 14: c_spine 2.0 orthogonals · axial · 0.21mm/px · z∈[-246,-206]mm · 2 of 84 slices shown]
[im 28/84  bone]
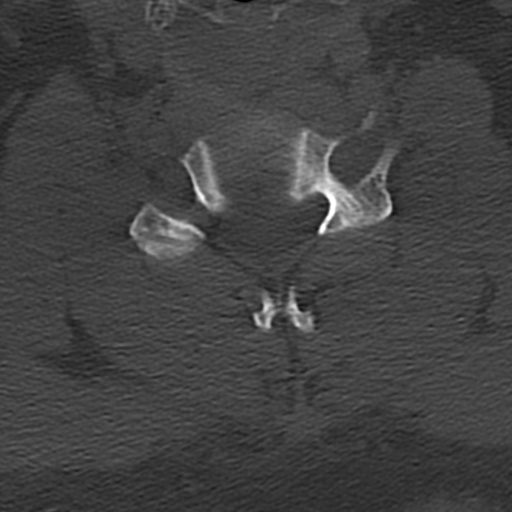
[im 56/84  bone]
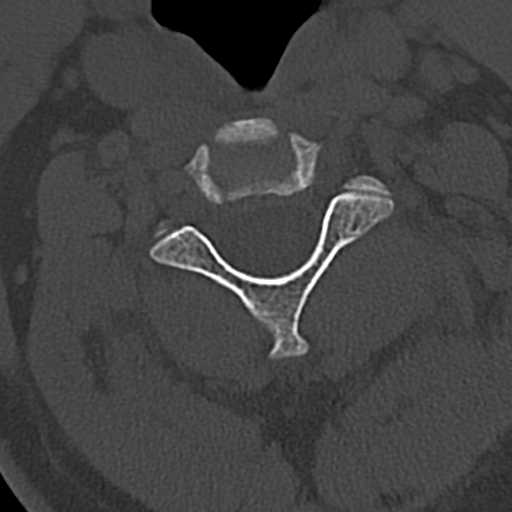

[12 of 33 positions shown; findings below may reference images not displayed]

FINDINGS: CT HEAD FINDINGS

Brain: Ventricles and sulci are appropriate for patient's age. No
evidence for acute cortically based infarct, intracranial
hemorrhage, mass lesion or mass-effect.

Vascular: Unremarkable

Skull: Intact.

Sinuses/Orbits: Paranasal sinuses mastoid air cells are well
aerated. Orbits are unremarkable.

Other: None.

CT CERVICAL SPINE FINDINGS

Alignment: Reversal of the normal cervical lordosis.

Skull base and vertebrae: Intact

Soft tissues and spinal canal: No prevertebral fluid or swelling. No
visible canal hematoma.

Disc levels: Preservation of the vertebral body and intervertebral
disc space heights. No acute fracture.

Upper chest: Poorly visualized.

Other: None.
IMPRESSION: No acute intracranial process.

No acute cervical spine fracture.

Vasculature is relatively dense however may be secondary to
dehydration.

## 2018-01-21 IMAGING — DX DG ANKLE COMPLETE 3+V*R*
3 series · 3 of 3 positions shown · non-contrast
Comparison: None.

CLINICAL DATA: Pain after fall

EXAM:
RIGHT ANKLE - COMPLETE 3+ VIEW

[ankle ap]
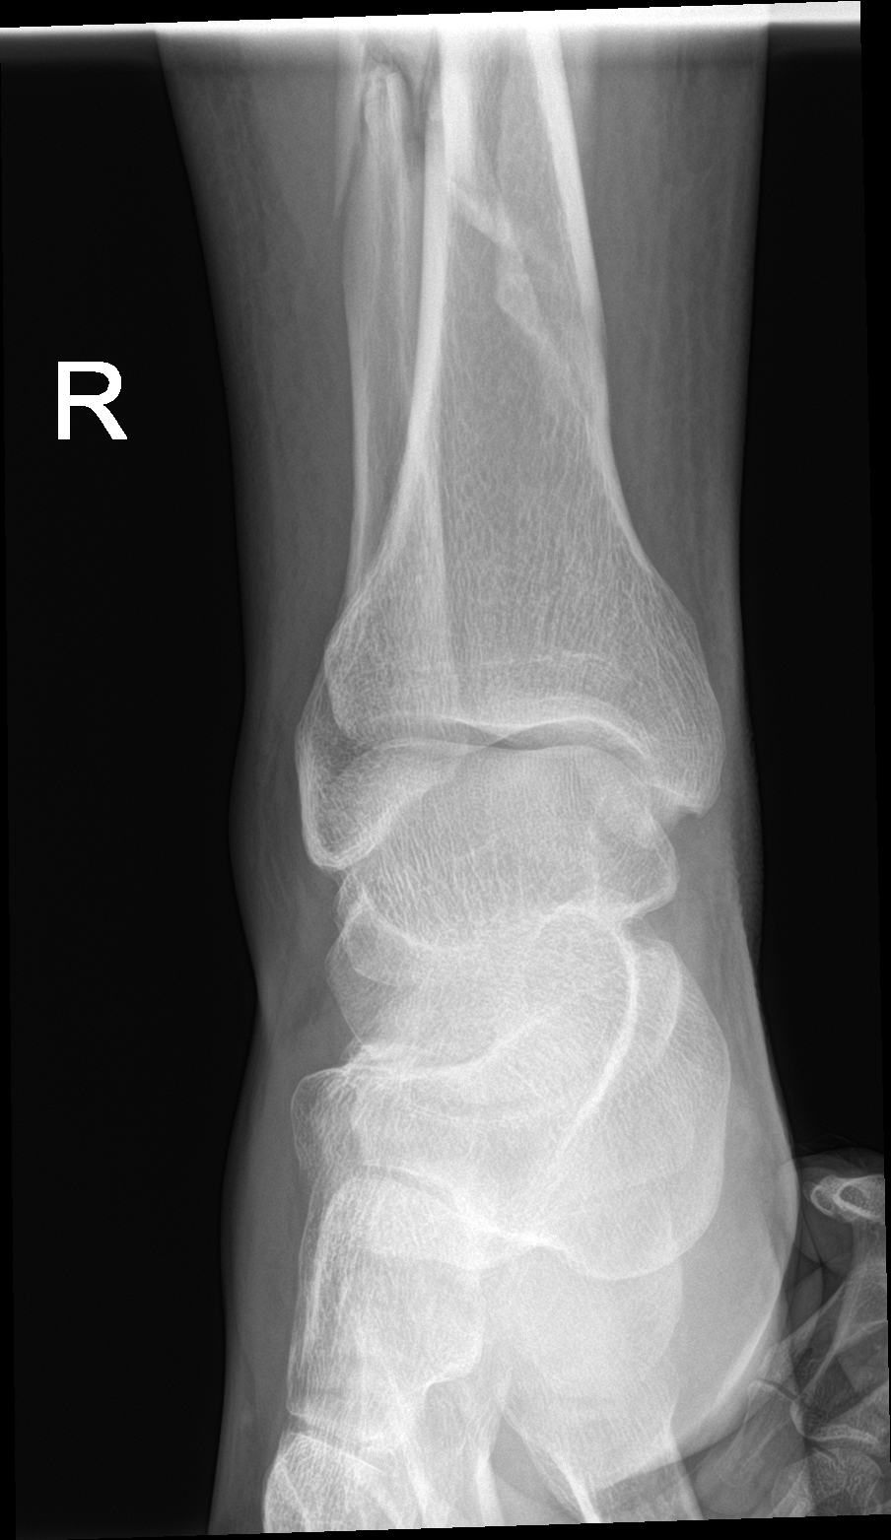

[ankle obl]
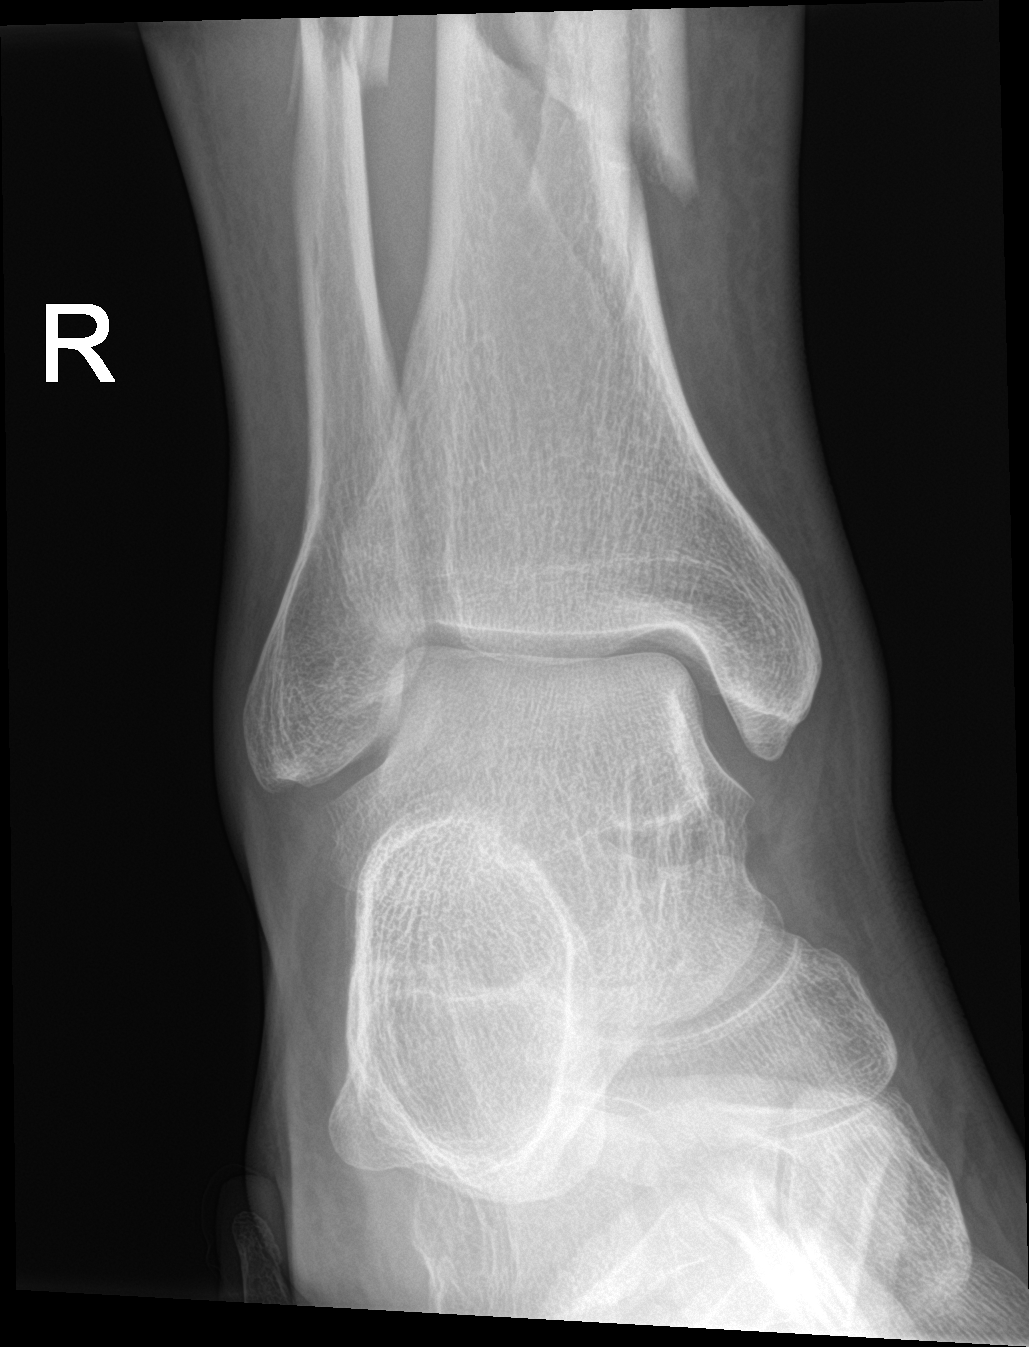

[ankle lat]
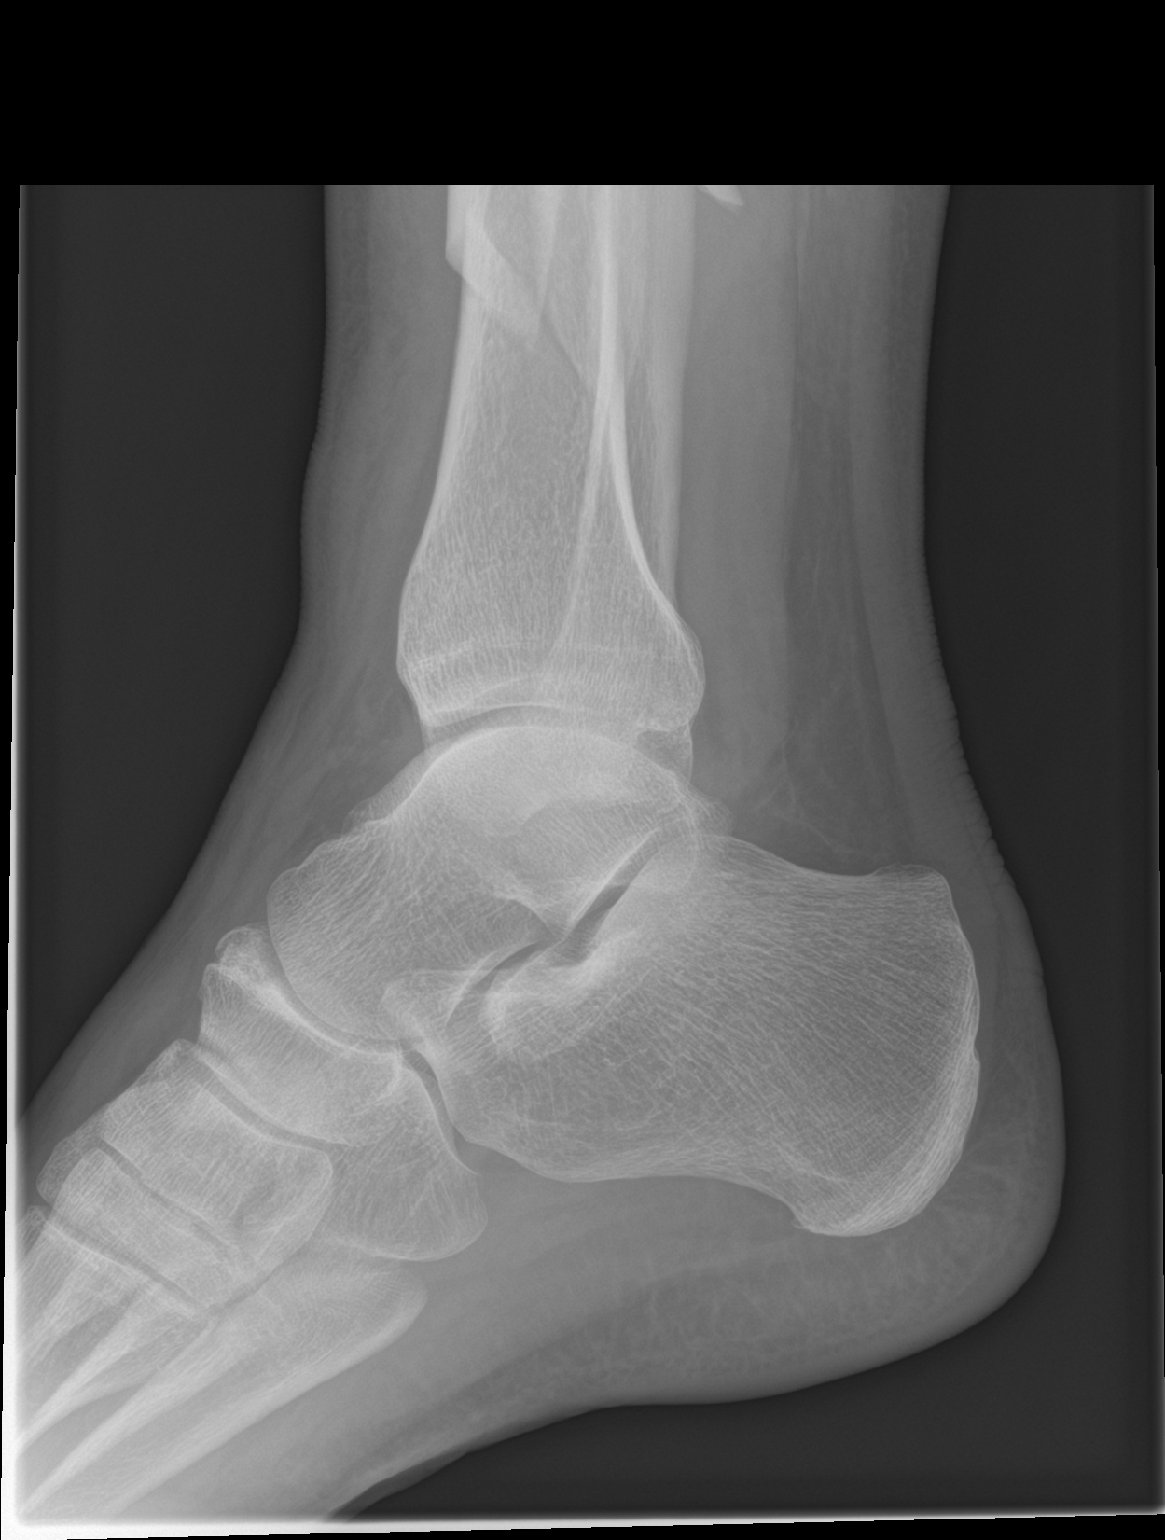

[3 of 3 positions shown; findings below may reference images not displayed]

FINDINGS: Comminuted mildly displaced distal fibular and tibial diaphysis
fracture is noted. No disruption of the ankle mortise noted. The
fractures are above the level of the ankle. No other acute
abnormalities.
IMPRESSION: No ankle fractures. Fractures of the distal tibia and fibula are
above the level of the ankle.

## 2018-01-21 IMAGING — DX DG TIBIA/FIBULA 2V*R*
4 series · 4 of 4 positions shown · non-contrast
Comparison: None

CLINICAL DATA: Pain after fall

EXAM:
RIGHT TIBIA AND FIBULA - 2 VIEW

[tibia ap (1 of 2)]
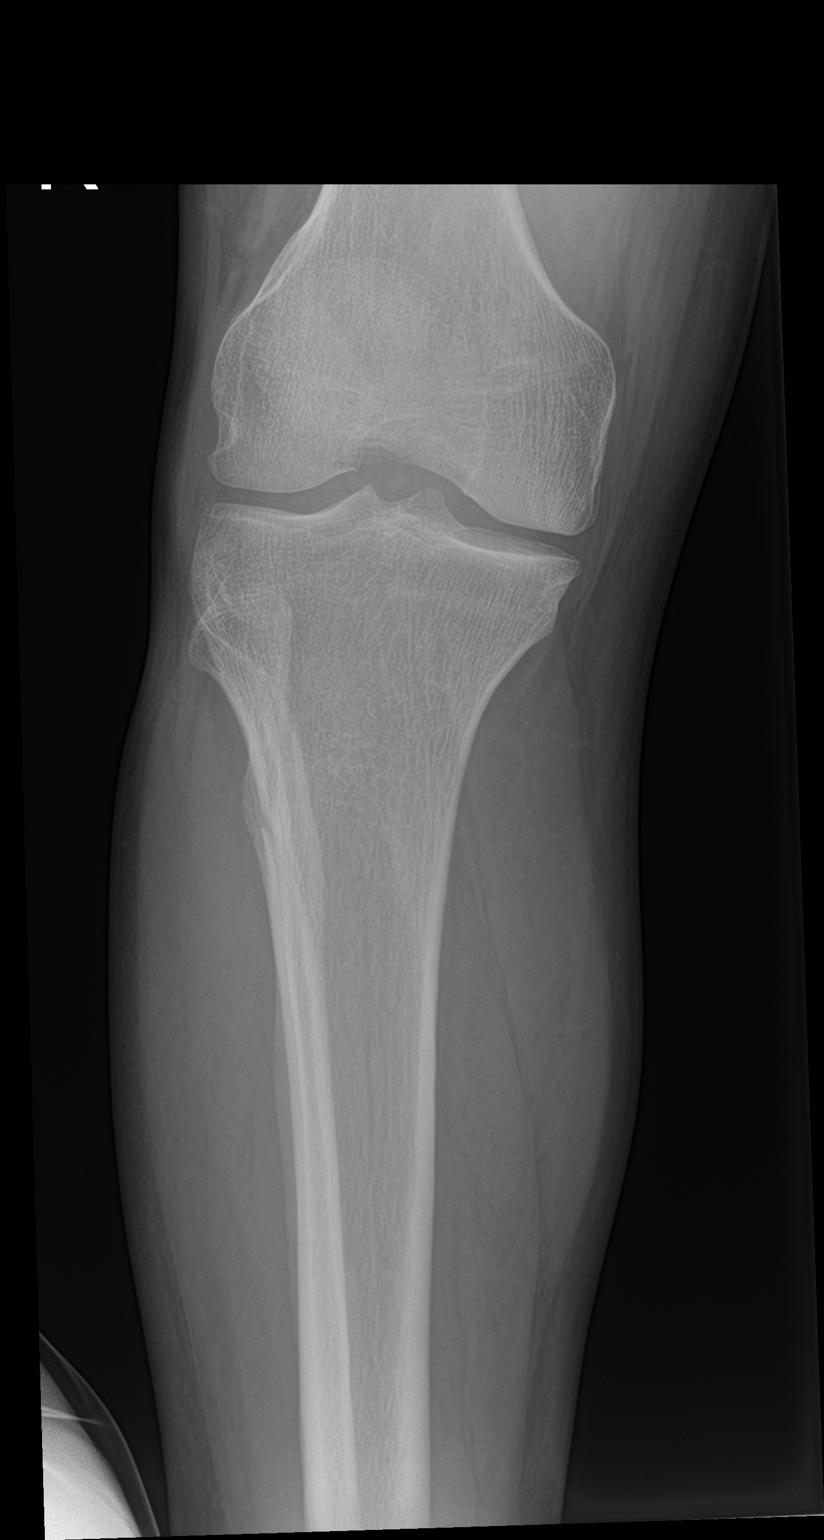

[tibia ap (2 of 2)]
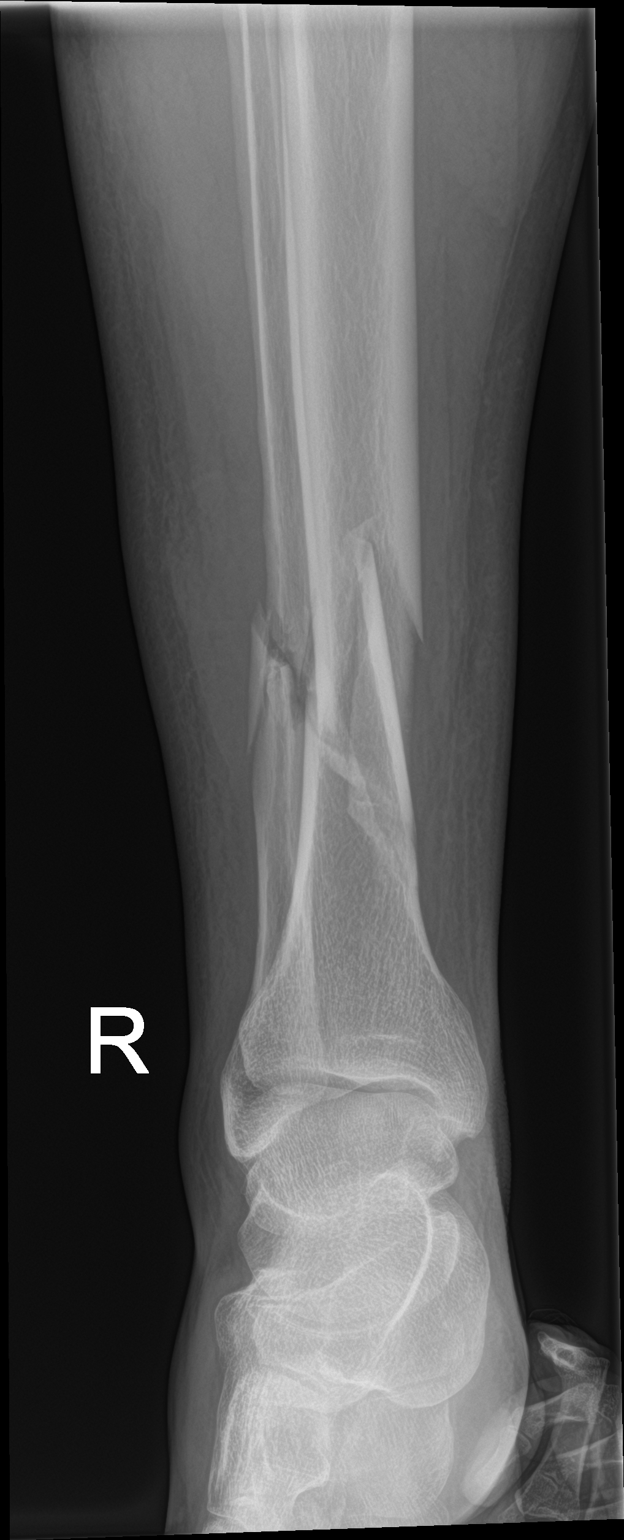

[tibia lat (1 of 2)]
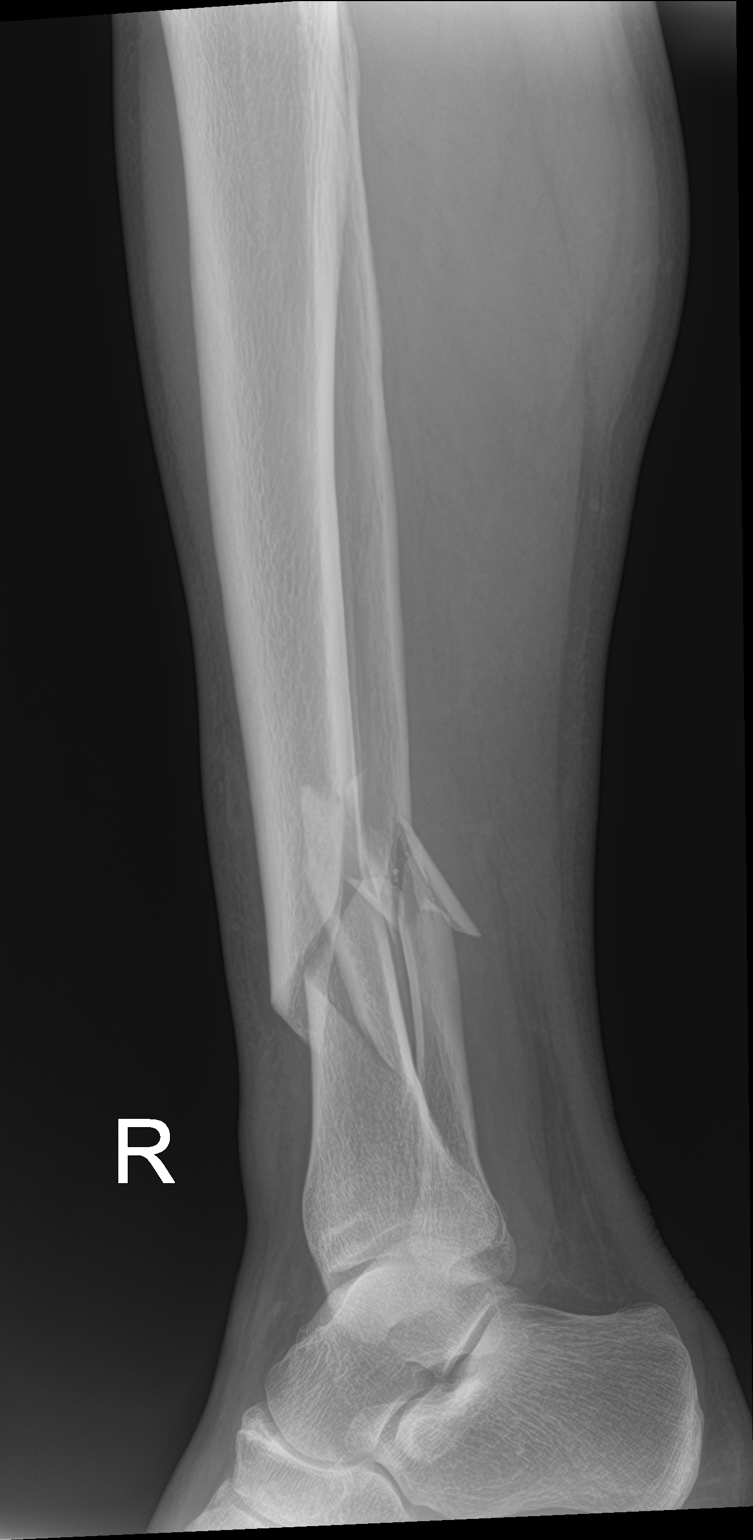

[tibia lat (2 of 2)]
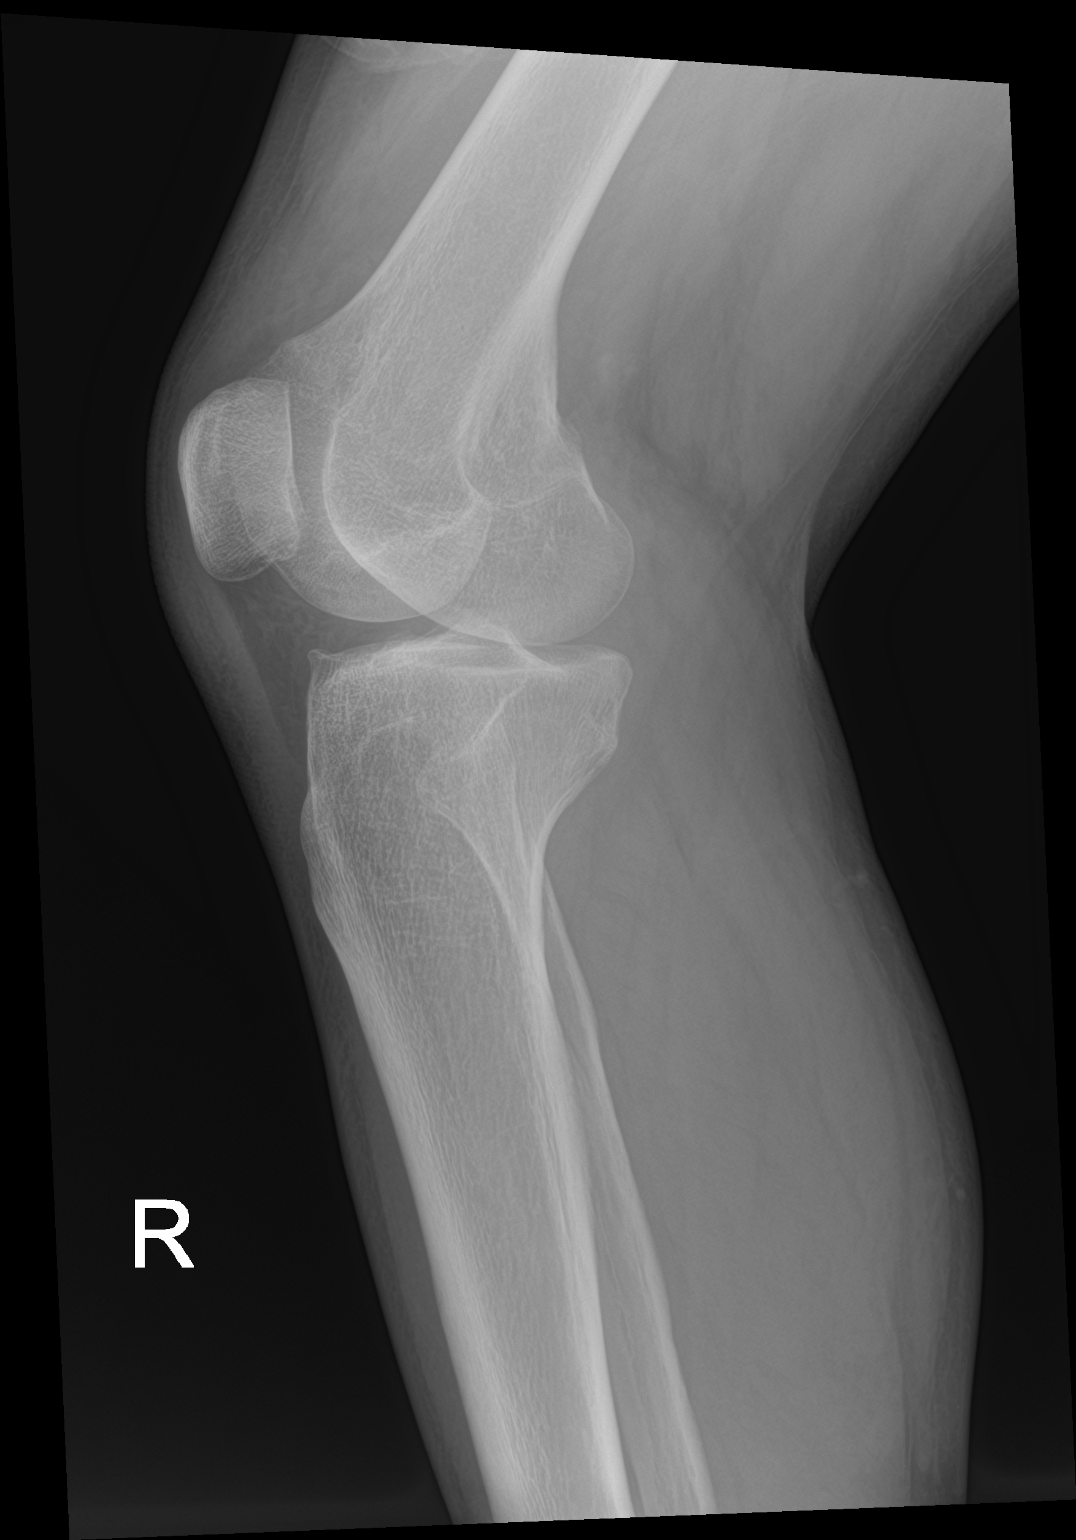

[4 of 4 positions shown; findings below may reference images not displayed]

FINDINGS: Comminuted mildly displaced distal fibular and tibial fractures.
IMPRESSION: Comminuted mildly displaced distal fibular and tibial fractures.

## 2018-01-21 MED ORDER — DOCUSATE SODIUM 100 MG PO CAPS
100.0000 mg | ORAL_CAPSULE | Freq: Two times a day (BID) | ORAL | Status: DC
  Administered 2018-01-22 – 2018-01-23 (×3): 100 mg via ORAL
  Filled 2018-01-21 (×3): qty 1

## 2018-01-21 MED ORDER — POLYETHYLENE GLYCOL 3350 17 G PO PACK: 17.0000 g | PACK | Freq: Every day | ORAL | Status: DC | PRN

## 2018-01-21 MED ORDER — MAGNESIUM CITRATE PO SOLN: 1.0000 | Freq: Once | ORAL | Status: DC | PRN

## 2018-01-21 MED ORDER — OXYCODONE HCL 5 MG PO TABS
5.0000 mg | ORAL_TABLET | ORAL | Status: DC | PRN
  Administered 2018-01-23: 5 mg via ORAL
  Filled 2018-01-21 (×2): qty 1

## 2018-01-21 MED ORDER — DIPHENHYDRAMINE HCL 12.5 MG/5ML PO ELIX: 12.5000 mg | ORAL_SOLUTION | ORAL | Status: DC | PRN

## 2018-01-21 MED ORDER — SODIUM CHLORIDE 0.9 % IV BOLUS
500.0000 mL | Freq: Once | INTRAVENOUS | Status: AC
  Administered 2018-01-21: 500 mL via INTRAVENOUS

## 2018-01-21 MED ORDER — ACETAMINOPHEN 325 MG PO TABS: 650.0000 mg | ORAL_TABLET | Freq: Four times a day (QID) | ORAL | Status: DC | PRN

## 2018-01-21 MED ORDER — SENNA 8.6 MG PO TABS
1.0000 | ORAL_TABLET | Freq: Two times a day (BID) | ORAL | Status: DC
  Administered 2018-01-22 – 2018-01-23 (×3): 8.6 mg via ORAL
  Filled 2018-01-21 (×3): qty 1

## 2018-01-21 MED ORDER — ONDANSETRON HCL 4 MG/2ML IJ SOLN: 4.0000 mg | Freq: Four times a day (QID) | INTRAMUSCULAR | Status: DC | PRN

## 2018-01-21 MED ORDER — POTASSIUM CHLORIDE IN NACL 20-0.9 MEQ/L-% IV SOLN
INTRAVENOUS | Status: DC
  Administered 2018-01-21: 22:00:00 via INTRAVENOUS
  Filled 2018-01-21 (×2): qty 1000

## 2018-01-21 MED ORDER — ACETAMINOPHEN 650 MG RE SUPP: 650.0000 mg | Freq: Four times a day (QID) | RECTAL | Status: DC | PRN

## 2018-01-21 MED ORDER — ZOLPIDEM TARTRATE 5 MG PO TABS: 5.0000 mg | ORAL_TABLET | Freq: Every evening | ORAL | Status: DC | PRN

## 2018-01-21 MED ORDER — MORPHINE SULFATE (PF) 4 MG/ML IV SOLN
8.0000 mg | Freq: Once | INTRAVENOUS | Status: AC
  Administered 2018-01-21: 8 mg via INTRAVENOUS
  Filled 2018-01-21: qty 2

## 2018-01-21 MED ORDER — MORPHINE SULFATE (PF) 4 MG/ML IV SOLN
4.0000 mg | Freq: Once | INTRAVENOUS | Status: AC
  Administered 2018-01-21: 4 mg via INTRAVENOUS
  Filled 2018-01-21: qty 1

## 2018-01-21 MED ORDER — BISACODYL 10 MG RE SUPP: 10.0000 mg | Freq: Every day | RECTAL | Status: DC | PRN

## 2018-01-21 MED ORDER — ONDANSETRON HCL 4 MG/2ML IJ SOLN
4.0000 mg | Freq: Once | INTRAMUSCULAR | Status: AC
  Administered 2018-01-21: 4 mg via INTRAVENOUS
  Filled 2018-01-21: qty 2

## 2018-01-21 MED ORDER — METHOCARBAMOL 500 MG PO TABS
500.0000 mg | ORAL_TABLET | Freq: Four times a day (QID) | ORAL | Status: DC | PRN
  Administered 2018-01-22: 500 mg via ORAL
  Filled 2018-01-21: qty 1

## 2018-01-21 MED ORDER — ONDANSETRON HCL 4 MG PO TABS: 4.0000 mg | ORAL_TABLET | Freq: Four times a day (QID) | ORAL | Status: DC | PRN

## 2018-01-21 MED ORDER — METHOCARBAMOL 1000 MG/10ML IJ SOLN
500.0000 mg | Freq: Four times a day (QID) | INTRAVENOUS | Status: DC | PRN
  Filled 2018-01-21: qty 5

## 2018-01-21 MED ORDER — HYDROMORPHONE HCL 1 MG/ML IJ SOLN
1.0000 mg | INTRAMUSCULAR | Status: DC | PRN
  Administered 2018-01-22 (×4): 1 mg via INTRAVENOUS
  Filled 2018-01-21 (×4): qty 1

## 2018-01-21 NOTE — ED Notes (Signed)
Pt NPO after midnight.  Gave [place] sandwich and snacks.

## 2018-01-21 NOTE — H&P (Signed)
PREOPERATIVE H&P  Chief Complaint: Right leg pain  HPI: Peter [name] is a 37 y.o. male who had a fall today after he apparently was drinking too much, this was at a bar, he was then found at his parents house.  He apparently did hit his head.  He currently denies any headache, he is eating dinner on the gurney.  He has a history of alcohol and substance abuse.  Pain is located over the right leg, denies any other injuries.  He denies having bleeding at the injury, no evidence for open fracture.  Past Medical History:  Diagnosis Date  . Alcoholism (HCC)   . Hypertension   . Substance abuse [facility])    Past Surgical History:  Procedure Laterality Date  . DENTAL SURGERY     Social History   Socioeconomic History  . Marital status: Single    Spouse name: Not on file  . Number of children: Not on file  . Years of education: Not on file  . Highest education level: Not on file  Occupational History  . Not on file  Social Needs  . Financial resource strain: Not on file  . Food insecurity:    Worry: Not on file    Inability: Not on file  . Transportation needs:    Medical: Not on file    Non-medical: Not on file  Tobacco Use  . Smoking status: Current Every Day Smoker    Packs/day: 0.50    Types: Cigarettes  . Smokeless tobacco: Never Used  Substance and Sexual Activity  . Alcohol use: Yes    Comment:    . Drug use: No  . Sexual activity: Yes    Birth control/protection: Condom  Lifestyle  . Physical activity:    Days per week: Not on file    Minutes per session: Not on file  . Stress: Not on file  Relationships  . Social connections:    Talks on phone: Not on file    Gets together: Not on file    Attends religious service: Not on file    Active member of club or organization: Not on file    Attends meetings of clubs or organizations: Not on file    Relationship status: Not on file  Other Topics Concern  . Not on file  Social History Narrative  . Not on file    Family History  Problem Relation Age of Onset  . Hypertension Father   . Stroke Father   . CAD Father   . Cancer Paternal Grandfather        Lung  . Hyperlipidemia Paternal Grandfather   . Hypertension Paternal Grandfather    No Known Allergies Prior to Admission medications   Medication Sig Start Date End Date Taking? Authorizing Provider  amLODipine (NORVASC) 10 MG tablet Take 1 tablet (10 mg total) by mouth daily. 12/03/17 06/01/18 Yes Mike Gip, FNP  busPIRone (BUSPAR) 7.5 MG tablet Take 1 tablet (7.5 mg total) by mouth 2 (two) times daily. 01/11/18 04/11/18 Yes Mike Gip, FNP  cloNIDine (CATAPRES) 0.1 MG tablet Take 1 tablet (0.1 mg total) by mouth 2 (two) times daily. 01/11/18  Yes Mike Gip, FNP  folic acid (FOLVITE) 1 MG tablet Take 1 tablet (1 mg total) by mouth daily. 05/21/17  Yes Rolly Salter, MD  losartan (COZAAR) 100 MG tablet Take 1 tablet (100 mg total) by mouth daily. 12/03/17 06/01/18 Yes Mike Gip, FNP  metoprolol tartrate (LOPRESSOR) 25 MG tablet Take 1 tablet (25  mg total) by mouth 2 (two) times daily. 12/14/17  Yes Mike Gipouglas, Andre, FNP  multivitamin (ONE-A-DAY MEN'S) TABS tablet Take 1 tablet by mouth daily.   Yes [provider]  venlafaxine XR (EFFEXOR XR) 37.5 MG 24 hr capsule Take 1 capsule (37.5 mg total) by mouth daily with breakfast. 01/11/18  Yes Mike Gipouglas, Andre, FNP  fenofibrate (TRICOR) 48 MG tablet Take 1 tablet (48 mg total) by mouth daily. Patient not taking: Reported on 11/28/2017 09/03/17 09/03/18  Mike Gipouglas, Andre, FNP  ondansetron (ZOFRAN ODT) 4 MG disintegrating tablet Take 1 tablet (4 mg total) by mouth every 8 (eight) hours as needed for nausea. Patient not taking: Reported on 01/21/2018 11/29/17   Garlon HatchetSanders, Lisa M, PA-C  oxyCODONE-acetaminophen (PERCOCET) 5-325 MG tablet Take 1 tablet by mouth every 4 (four) hours as needed. Patient not taking: Reported on 01/11/2018 11/29/17   Garlon HatchetSanders, Lisa M, PA-C  propranolol ER (INDERAL  LA) 60 MG 24 hr capsule Take 1 capsule (60 mg total) by mouth daily. Patient not taking: Reported on 12/14/2017 12/03/17   Mike Gipouglas, Andre, FNP  thiamine (VITAMIN B-1) 100 MG tablet Take 1 tablet (100 mg total) by mouth daily. Patient not taking: Reported on 01/21/2018 05/21/17   Rolly SalterPatel, Pranav M, MD     Positive ROS: All other systems have been reviewed and were otherwise negative with the exception of those mentioned in the HPI and as above.  Physical Exam: General: Alert, no acute distress Cardiovascular: No pedal edema Respiratory: No cyanosis, no use of accessory musculature GI: No organomegaly, abdomen is soft and non-tender Skin: No lesions in the area of chief complaint Neurologic: Sensation intact distally Psychiatric: Patient is competent for consent with normal mood and affect Lymphatic: No axillary or cervical lymphadenopathy  MUSCULOSKELETAL: Right leg EHL and FHL are intact, sensation intact throughout the toes, he is currently splinted.  Assessment: Right tibia and fibula fracture with some displacement, alcohol abuse, substance abuse   Plan: Plan for Procedure(s): RIGHT INTRAMEDULLARY (IM) NAIL TIBIAL  The risks benefits and alternatives were discussed with the patient including but not limited to the risks of nonoperative treatment, versus surgical intervention including infection, bleeding, nerve injury, malunion, nonunion, the need for revision surgery, hardware prominence, hardware failure, the need for hardware removal, blood clots, cardiopulmonary complications, morbidity, mortality, among others, and they were willing to proceed.     Anticipated LOS equal to or greater than 2 midnights due to - Age 37 and older with one or more of the following:  - Obesity  - Expected need for hospital services (PT, OT, Nursing) required for safe  discharge  - Anticipated need for postoperative skilled nursing care or inpatient rehab  - Active co-morbidities: polysybstance abuse  with acute alcohol intoxication.  He is not safe for discharge given his intoxication, and will need surgical intervention.  Plan for admission, optimization, and surgery likely tomorrow.     Eulas PostJoshua P Batsheva Stevick, MD Cell [phone]   01/21/2018 11:11 PM

## 2018-01-21 NOTE — ED Triage Notes (Addendum)
Pt arrived via [facility] from mother's home where mother found pt down on floor when she arrived home. Unkown down time. Family reported ETOH use by pt. Pt is alert and oriented x4 at time of triage. Mother en route to ED, per EMS. Injury w/ deformity noted to right ankle.

## 2018-01-21 NOTE — ED Provider Notes (Signed)
[name] EMERGENCY DEPARTMENT Provider Note   CSN: [id number] Arrival date & time: 01/21/18  1834     History   Chief Complaint Chief Complaint  Patient presents with  . Fall    HPI Peter [name] is a 37 y.o. male.  HPI   Patient is a 37 year old male with a history of EtOH abuse, hypertension, substance abuse who presents the emergency department today after mechanical fall that occurred prior to arrival.  Patient arrives via EMS who reports that patient has been drinking alcohol today.  Patient endorses drinking an estimated 7 beers today, but states that he does not actually know how much he drank today just that he drank "too much ".  Mother states that patient was walking out of the bar when he fell.  Another person present witnessed the fall and states that he hit his head.  Since then patient is complaining of pain to the right lower extremity. Denies sensory changes.  Denies headache, vomiting, neck or back pain or pain to the remainder of his extremities.  No chest pain or shortness of breath.  He denies any drug use today.  Past Medical History:  Diagnosis Date  . Alcoholism (HCC)   . Hypertension   . Substance abuse [facility])     Patient Active Problem List   Diagnosis Date Noted  . Right tibial fracture 01/21/2018  . Chest pain 05/20/2017  . Hypomagnesemia 02/15/2017  . Abdominal pain 02/14/2017  . Acute pancreatitis 02/14/2017  . Alcoholic hepatitis without ascites 02/14/2017  . Hypokalemia 12/03/2016  . Leukocytosis 12/03/2016  . Alcohol withdrawal (HCC) 12/02/2016  . Alcohol dependence with other alcohol-induced disorder (HCC) 11/18/2016  . 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 Essential hypertension 11/18/2016  . Elevated liver enzymes 11/14/2016    Past Surgical History:  Procedure Laterality Date  . DENTAL SURGERY          Home Medications    Prior to Admission medications   Medication Sig Start Date End Date Taking?  Authorizing Provider  amLODipine (NORVASC) 10 MG tablet Take 1 tablet (10 mg total) by mouth daily. 12/03/17 06/01/18 Yes Mike Gip, FNP  busPIRone (BUSPAR) 7.5 MG tablet Take 1 tablet (7.5 mg total) by mouth 2 (two) times daily. 01/11/18 04/11/18 Yes Mike Gip, FNP  cloNIDine (CATAPRES) 0.1 MG tablet Take 1 tablet (0.1 mg total) by mouth 2 (two) times daily. 01/11/18  Yes Mike Gip, FNP  folic acid (FOLVITE) 1 MG tablet Take 1 tablet (1 mg total) by mouth daily. 05/21/17  Yes Rolly Salter, MD  losartan (COZAAR) 100 MG tablet Take 1 tablet (100 mg total) by mouth daily. 12/03/17 06/01/18 Yes Mike Gip, FNP  metoprolol tartrate (LOPRESSOR) 25 MG tablet Take 1 tablet (25 mg total) by mouth 2 (two) times daily. 12/14/17  Yes Mike Gip, FNP  multivitamin (ONE-A-DAY MEN'S) TABS tablet Take 1 tablet by mouth daily.   Yes [provider]  venlafaxine XR (EFFEXOR XR) 37.5 MG 24 hr capsule Take 1 capsule (37.5 mg total) by mouth daily with breakfast. 01/11/18  Yes Mike Gip, FNP  fenofibrate (TRICOR) 48 MG tablet Take 1 tablet (48 mg total) by mouth daily. Patient not taking: Reported on 11/28/2017 09/03/17 09/03/18  Mike Gip, FNP  ondansetron (ZOFRAN ODT) 4 MG disintegrating tablet Take 1 tablet (4 mg total) by mouth every 8 (eight) hours as needed for nausea. Patient not taking: Reported on 01/21/2018 11/29/17   Garlon Hatchet, PA-C  oxyCODONE-acetaminophen [facility]) [phone]  MG tablet Take 1 tablet by mouth every 4 (four) hours as needed. Patient not taking: Reported on 01/11/2018 11/29/17   Garlon HatchetSanders, Lisa M, PA-C  propranolol ER (INDERAL LA) 60 MG 24 hr capsule Take 1 capsule (60 mg total) by mouth daily. Patient not taking: Reported on 12/14/2017 12/03/17   Mike Gipouglas, Andre, FNP  thiamine (VITAMIN B-1) 100 MG tablet Take 1 tablet (100 mg total) by mouth daily. Patient not taking: Reported on 01/21/2018 05/21/17   Rolly SalterPatel, Pranav M, MD    Family History Family History    Problem Relation Age of Onset  . Hypertension Father   . Stroke Father   . CAD Father   . Cancer Paternal Grandfather        Lung  . Hyperlipidemia Paternal Grandfather   . Hypertension Paternal Grandfather     Social History Social History   Tobacco Use  . Smoking status: Current Every Day Smoker    Packs/day: 0.50    Types: Cigarettes  . Smokeless tobacco: Never Used  Substance Use Topics  . Alcohol use: Yes    Comment:    . Drug use: No     Allergies   Patient has no known allergies.   Review of Systems Review of Systems  Constitutional: Negative for fever.  HENT: Negative for ear pain and sore throat.   Eyes: Negative for visual disturbance.  Respiratory: Negative for shortness of breath.   Cardiovascular: Negative for chest pain.  Gastrointestinal: Negative for abdominal pain, constipation, diarrhea, nausea and vomiting.  Genitourinary: Negative for flank pain.  Musculoskeletal: Negative for back pain and neck pain.       RLE pain  Skin: Negative for rash.  Neurological: Negative for dizziness, light-headedness and headaches.       Head trauma  All other systems reviewed and are negative.    Physical Exam Updated Vital Signs BP 110/77   Pulse (!) 117   Temp 98.1 F (36.7 C) (Oral)   Resp 16   SpO2 (!) 89%   Physical Exam  Constitutional: He appears well-developed and well-nourished.  HENT:  Head: Normocephalic and atraumatic.  Eyes: Conjunctivae are normal.  Neck: Neck supple.  In c-collar.  Cardiovascular: Normal rate, regular rhythm and normal heart sounds.  No murmur heard. Pulmonary/Chest: Effort normal and breath sounds normal. No stridor. No respiratory distress. He has no wheezes.  Abdominal: Soft. Bowel sounds are normal. He exhibits no distension. There is no tenderness. There is no guarding.  Musculoskeletal: He exhibits no edema.  Tenderness to palpation and obvious deformity to the right tibia.  Diffuse tenderness to the distal  right tib/fib with overlying soft tissue swelling.  Compartments are soft.  DP pulses present bilaterally.  Cap refill 2 to 3 seconds bilaterally.  Patient denies sensory changes to the right lower extremity.  Able to wiggle toes bilaterally.  No tenderness to the proximal tib/fib or throughout the foot and ankle.  No midline tenderness to the cervical, thoracic or lumbar spine.  Neurological: He is alert.  Mental Status:  Alert, thought content appropriate, able to give a coherent history. Speech fluent without evidence of aphasia. Able to follow 2 step commands without difficulty.  Cranial Nerves:  II: pupils equal, round, reactive to light III,IV, VI: ptosis not present, extra-ocular motions intact bilaterally  V,VII: smile symmetric, facial light touch sensation equal VIII: hearing grossly normal to voice  X: uvula elevates symmetrically  XI: bilateral shoulder shrug symmetric and strong XII: midline tongue extension without fassiculations  Motor:  Normal tone. 5/5 strength of BUE. 5/5 LLE strength. RLE strength not assessed given injury to tib/fib.  Sensory: light touch normal in all extremities.   Skin: Skin is warm and dry.  Psychiatric: He has a normal mood and affect.  Nursing note and vitals reviewed.  ED Treatments / Results  Labs (all labs ordered are listed, but only abnormal results are displayed) Labs Reviewed  CBC WITH DIFFERENTIAL/PLATELET - Abnormal; Notable for the following components:      Result Value   WBC 19.1 (*)    Hemoglobin 17.2 (*)    Neutro Abs 16.2 (*)    Abs Immature Granulocytes 0.20 (*)    All other components within normal limits  BASIC METABOLIC PANEL - Abnormal; Notable for the following components:   Sodium 134 (*)    CO2 20 (*)    Glucose, Bld 129 (*)    Anion gap 16 (*)    All other components within normal limits  ETHANOL - Abnormal; Notable for the following components:   Alcohol, Ethyl (B) 267 (*)    All other components within normal  limits  HIV ANTIBODY (ROUTINE TESTING W REFLEX)    EKG None  Radiology Dg Tibia/fibula Right  Result Date: 01/21/2018 CLINICAL DATA:  Pain after fall EXAM: RIGHT TIBIA AND FIBULA - 2 VIEW COMPARISON:  None FINDINGS: Comminuted mildly displaced distal fibular and tibial fractures. IMPRESSION: Comminuted mildly displaced distal fibular and tibial fractures. Electronically Signed   By: Gerome Sam III M.D   On: 01/21/2018 20:16   Dg Ankle Complete Right  Result Date: 01/21/2018 CLINICAL DATA:  Pain after fall EXAM: RIGHT ANKLE - COMPLETE 3+ VIEW COMPARISON:  None. FINDINGS: Comminuted mildly displaced distal fibular and tibial diaphysis fracture is noted. No disruption of the ankle mortise noted. The fractures are above the level of the ankle. No other acute abnormalities. IMPRESSION: No ankle fractures. Fractures of the distal tibia and fibula are above the level of the ankle. Electronically Signed   By: Gerome Sam III M.D   On: 01/21/2018 20:17   Ct Head Wo Contrast  Result Date: 01/21/2018 CLINICAL DATA:  Patient found down. EXAM: CT HEAD WITHOUT CONTRAST CT CERVICAL SPINE WITHOUT CONTRAST TECHNIQUE: Multidetector CT imaging of the head and cervical spine was performed following the standard protocol without intravenous contrast. Multiplanar CT image reconstructions of the cervical spine were also generated. COMPARISON:  None. FINDINGS: CT HEAD FINDINGS Brain: Ventricles and sulci are appropriate for patient's age. No evidence for acute cortically based infarct, intracranial hemorrhage, mass lesion or mass-effect. Vascular: Unremarkable Skull: Intact. Sinuses/Orbits: Paranasal sinuses mastoid air cells are well aerated. Orbits are unremarkable. Other: None. CT CERVICAL SPINE FINDINGS Alignment: Reversal of the normal cervical lordosis. Skull base and vertebrae: Intact Soft tissues and spinal canal: No prevertebral fluid or swelling. No visible canal hematoma. Disc levels: Preservation of  the vertebral body and intervertebral disc space heights. No acute fracture. Upper chest: Poorly visualized. Other: None. IMPRESSION: No acute intracranial process. No acute cervical spine fracture. Vasculature is relatively dense however may be secondary to dehydration. Electronically Signed   By: Annia Belt M.D.   On: 01/21/2018 20:26   Ct Cervical Spine Wo Contrast  Result Date: 01/21/2018 CLINICAL DATA:  Patient found down. EXAM: CT HEAD WITHOUT CONTRAST CT CERVICAL SPINE WITHOUT CONTRAST TECHNIQUE: Multidetector CT imaging of the head and cervical spine was performed following the standard protocol without intravenous contrast. Multiplanar CT image reconstructions of the cervical spine were  also generated. COMPARISON:  None. FINDINGS: CT HEAD FINDINGS Brain: Ventricles and sulci are appropriate for patient's age. No evidence for acute cortically based infarct, intracranial hemorrhage, mass lesion or mass-effect. Vascular: Unremarkable Skull: Intact. Sinuses/Orbits: Paranasal sinuses mastoid air cells are well aerated. Orbits are unremarkable. Other: None. CT CERVICAL SPINE FINDINGS Alignment: Reversal of the normal cervical lordosis. Skull base and vertebrae: Intact Soft tissues and spinal canal: No prevertebral fluid or swelling. No visible canal hematoma. Disc levels: Preservation of the vertebral body and intervertebral disc space heights. No acute fracture. Upper chest: Poorly visualized. Other: None. IMPRESSION: No acute intracranial process. No acute cervical spine fracture. Vasculature is relatively dense however may be secondary to dehydration. Electronically Signed   By: Annia Belt M.D.   On: 01/21/2018 20:26    Procedures Procedures (including critical care time)  Medications Ordered in ED Medications  acetaminophen (TYLENOL) tablet 650 mg (has no administration in time range)    Or  acetaminophen (TYLENOL) suppository 650 mg (has no administration in time range)  0.9 % NaCl with KCl  20 mEq/ L  infusion ( Intravenous Transfusing/Transfer 01/21/18 2234)  oxyCODONE (Oxy IR/ROXICODONE) immediate release tablet 5-10 mg (has no administration in time range)  HYDROmorphone (DILAUDID) injection 1 mg (has no administration in time range)  methocarbamol (ROBAXIN) tablet 500 mg (has no administration in time range)    Or  methocarbamol (ROBAXIN) 500 mg in dextrose 5 % 50 mL IVPB (has no administration in time range)  zolpidem (AMBIEN) tablet 5 mg (has no administration in time range)  diphenhydrAMINE (BENADRYL) 12.5 MG/5ML elixir 12.5-25 mg (has no administration in time range)  docusate sodium (COLACE) capsule 100 mg (has no administration in time range)  senna (SENOKOT) tablet 8.6 mg (has no administration in time range)  polyethylene glycol (MIRALAX / GLYCOLAX) packet 17 g (has no administration in time range)  bisacodyl (DULCOLAX) suppository 10 mg (has no administration in time range)  magnesium citrate solution 1 Bottle (has no administration in time range)  ondansetron (ZOFRAN) tablet 4 mg (has no administration in time range)    Or  ondansetron (ZOFRAN) injection 4 mg (has no administration in time range)  morphine 4 MG/ML injection 4 mg (4 mg Intravenous Given 01/21/18 2029)  ondansetron (ZOFRAN) injection 4 mg (4 mg Intravenous Given 01/21/18 2023)  sodium chloride 0.9 % bolus 500 mL (0 mLs Intravenous Stopped 01/21/18 2132)  morphine 4 MG/ML injection 8 mg (8 mg Intravenous Given 01/21/18 2103)  ondansetron (ZOFRAN) injection 4 mg (4 mg Intravenous Given 01/21/18 2102)     Initial Impression / Assessment and Plan / ED Course  I have reviewed the triage vital signs and the nursing notes.  Pertinent labs & imaging results that were available during my care of the patient were reviewed by me and considered in my medical decision making (see chart for details).     Final Clinical Impressions(s) / ED Diagnoses   Final diagnoses:  Closed fracture of distal end of right  tibia, unspecified fracture morphology, initial encounter  Closed fracture of distal end of right fibula, unspecified fracture morphology, initial encounter   Patient presenting after mechanical fall with EtOH on board.  Positive head trauma.  Unclear if LOC.  No midline tenderness but unable to clear C-spine based on his intoxication.  He has an obvious deformity to the distal right tibia.  He appears to be grossly neurovascularly intact.  No evidence of compartment syndrome. No tenderness throughout the remainder of the right  lower extremity.  Will obtain basic labs in anticipation of the need for surgery.  We will also obtain EtOH.  Will obtain CT of the head and neck as well as right lower extremity imaging.  CBC with white blood cell count at 19.1, hemoglobin elevated at 17, likely due to hemoconcentration.  BMP with BMP low CO2 at 20, elevated anion gap at 16. ETOH elevated at 267.  Xray right tib/fib with comminuted mildly displaced distal fibular and tibial fractures Xray right ankle without evidence of ankle fracture, distal tib/fib fractures above the level of the ankle.  CT head negative for acute intracranial process. CT cervical spine negative for acute bony abnormality.   8:56 PM CONSULT with Dr. Dion Saucier with orthopedic surgery who reviewed the imaging studies and stated that patient will likely need surgery.  He recommended posterior lower extremity splint.  Stated that patient can be admitted with plan for surgery likely tomorrow.  ED Discharge Orders    None       Rayne Du 01/21/18 2311    Melene Plan, DO 01/21/18 2349

## 2018-01-21 NOTE — Progress Notes (Signed)
Called and reviewed case with ER provider.  Patient with acute right tibia and fibular shaft fracture, alcohol intoxication.  Plan for admission, n.p.o., optimization of blood alcohol level, probable surgery tomorrow if operating room available.  Full admission to follow.  Admission orders placed.  No current signs for compartment syndrome, no evidence for open fracture.  N.p.o. after midnight.  Teryl LucyJoshua Erina Hamme, MD.

## 2018-01-21 NOTE — ED Notes (Signed)
Attempting report

## 2018-01-22 ENCOUNTER — Inpatient Hospital Stay (HOSPITAL_COMMUNITY): Payer: Self-pay | Admitting: Certified Registered Nurse Anesthetist

## 2018-01-22 ENCOUNTER — Inpatient Hospital Stay (HOSPITAL_COMMUNITY): Payer: Self-pay

## 2018-01-22 ENCOUNTER — Encounter (HOSPITAL_COMMUNITY): Payer: Self-pay | Admitting: Certified Registered Nurse Anesthetist

## 2018-01-22 ENCOUNTER — Encounter (HOSPITAL_COMMUNITY)
Admission: EM | Disposition: A | Discharge status: Home / Self Care | Payer: Self-pay | Source: Home / Self Care | Attending: Orthopedic Surgery

## 2018-01-22 HISTORY — PX: TIBIA IM NAIL INSERTION: SHX2516

## 2018-01-22 LAB — SURGICAL PCR SCREEN
MRSA, PCR: NEGATIVE
Staphylococcus aureus: NEGATIVE

## 2018-01-22 IMAGING — RF DG C-ARM 61-120 MIN
1 series · 4 of 4 positions shown · non-contrast
Comparison: Right tibia and fibula x-rays from yesterday.

CLINICAL DATA: Right tibia ORIF.

EXAM:
RIGHT TIBIA AND FIBULA - 2 VIEW; DG C-ARM 61-120 MIN

[Series 1: run · 4 of 4 slices shown]
[im 1/4]
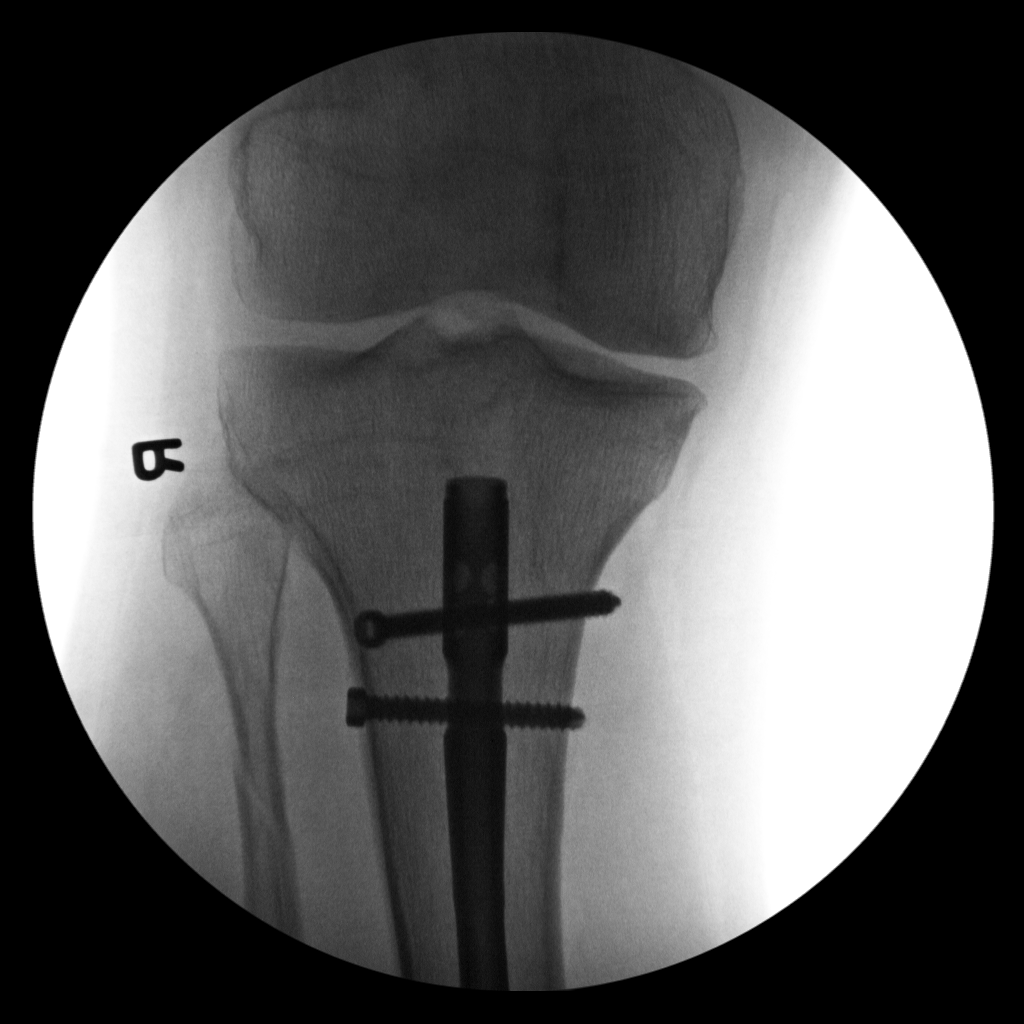
[im 2/4]
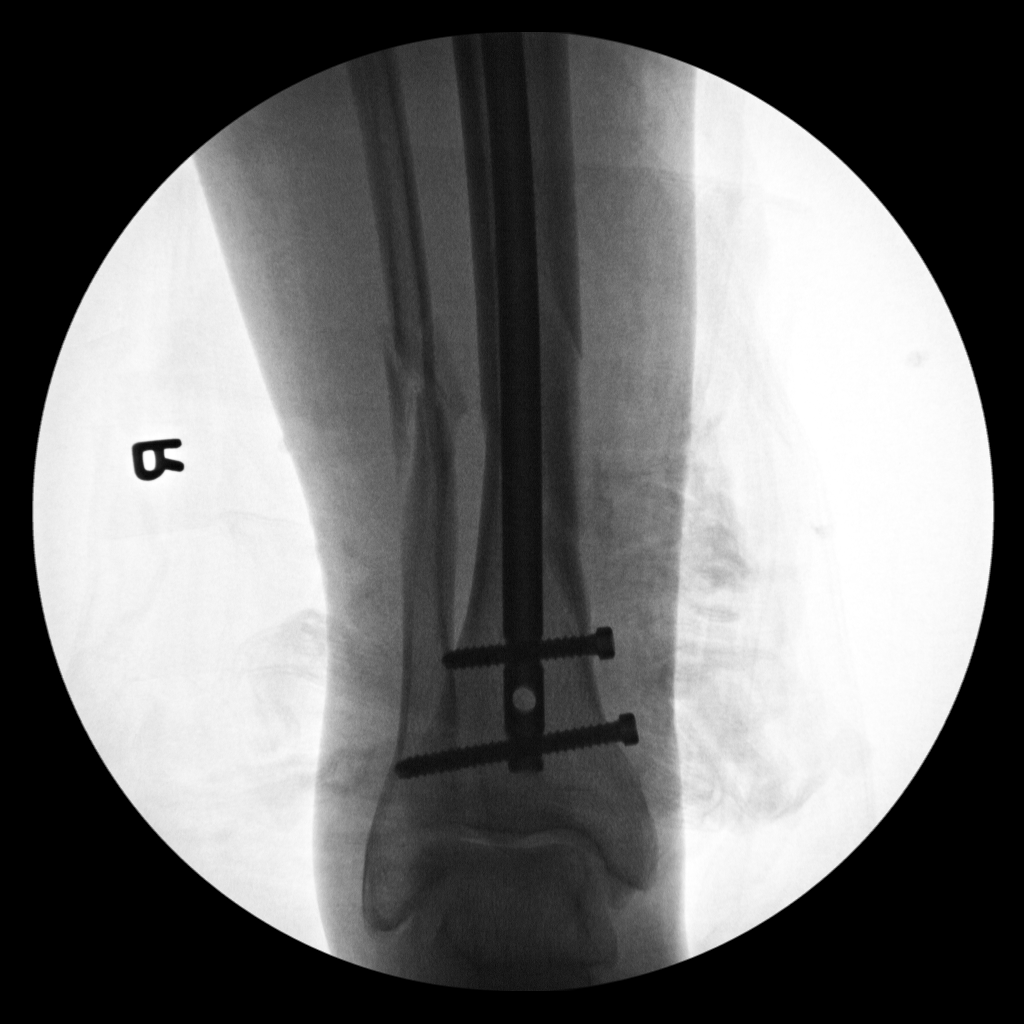
[im 3/4]
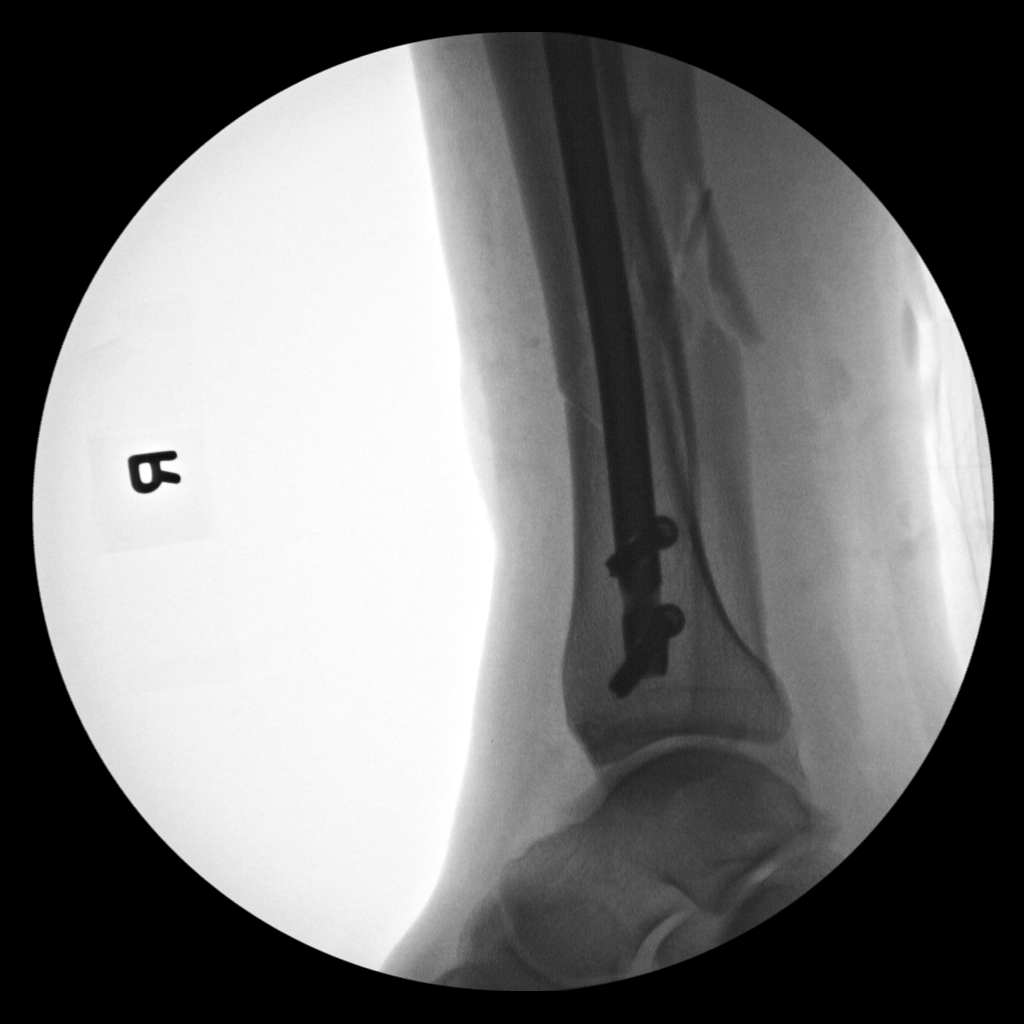
[im 4/4]
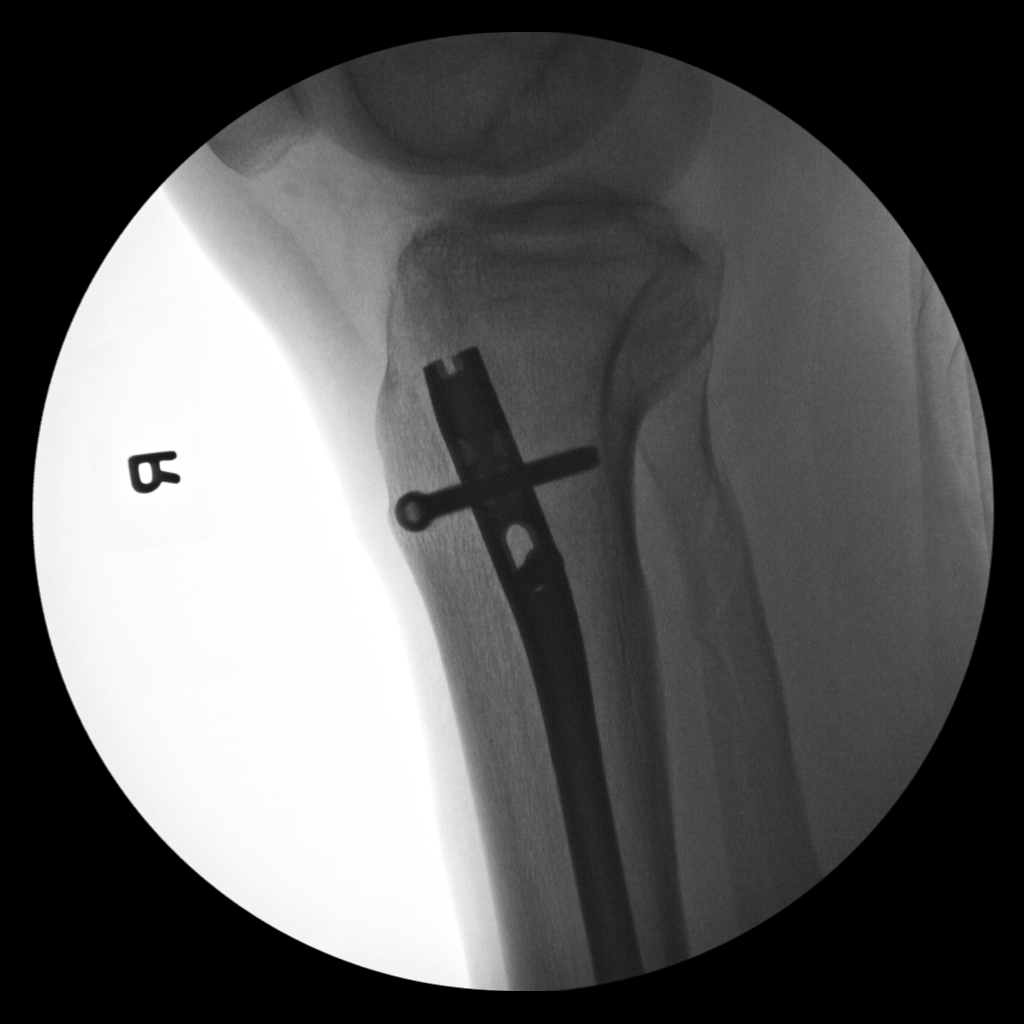

[4 of 4 positions shown; findings below may reference images not displayed]

FLUOROSCOPY TIME:  1 minutes, 49 seconds.

C-arm fluoroscopic images were obtained intraoperatively and
submitted for post operative interpretation.
FINDINGS: Multiple intraoperative fluoroscopic images demonstrate interval
ORIF of the distal tibial fracture with an intramedullary nail.
Alignment is anatomic. Unchanged mildly displaced comminuted distal
fibular diaphyseal fracture. Unchanged minimally displaced spiral
fracture of the proximal fibular diaphysis.
IMPRESSION: 1. Intraoperative fluoroscopic guidance for right tibia ORIF, now in
anatomic alignment.
2. Unchanged proximal and distal fibular fractures.

## 2018-01-22 SURGERY — INSERTION, INTRAMEDULLARY ROD, TIBIA
Anesthesia: General | Site: Leg Lower | Laterality: Right

## 2018-01-22 MED ORDER — VENLAFAXINE HCL ER 37.5 MG PO CP24
37.5000 mg | ORAL_CAPSULE | Freq: Every day | ORAL | Status: DC
  Administered 2018-01-23: 37.5 mg via ORAL
  Filled 2018-01-22: qty 1

## 2018-01-22 MED ORDER — ROCURONIUM BROMIDE 50 MG/5ML IV SOSY
PREFILLED_SYRINGE | INTRAVENOUS | Status: DC | PRN
  Administered 2018-01-22: 50 mg via INTRAVENOUS

## 2018-01-22 MED ORDER — PHENYLEPHRINE 40 MCG/ML (10ML) SYRINGE FOR IV PUSH (FOR BLOOD PRESSURE SUPPORT)
PREFILLED_SYRINGE | INTRAVENOUS | Status: DC | PRN
  Administered 2018-01-22: 200 ug via INTRAVENOUS
  Administered 2018-01-22: 120 ug via INTRAVENOUS
  Administered 2018-01-22: 200 ug via INTRAVENOUS

## 2018-01-22 MED ORDER — BUSPIRONE HCL 5 MG PO TABS
7.5000 mg | ORAL_TABLET | Freq: Two times a day (BID) | ORAL | Status: DC
  Administered 2018-01-22 – 2018-01-23 (×2): 7.5 mg via ORAL
  Filled 2018-01-22 (×2): qty 2

## 2018-01-22 MED ORDER — PHENYLEPHRINE 40 MCG/ML (10ML) SYRINGE FOR IV PUSH (FOR BLOOD PRESSURE SUPPORT)
PREFILLED_SYRINGE | INTRAVENOUS | Status: AC
  Filled 2018-01-22: qty 10

## 2018-01-22 MED ORDER — METOPROLOL TARTRATE 25 MG PO TABS
25.0000 mg | ORAL_TABLET | Freq: Once | ORAL | Status: DC
  Administered 2018-01-22: 25 mg via ORAL
  Filled 2018-01-22 (×2): qty 1

## 2018-01-22 MED ORDER — ACETAMINOPHEN 325 MG PO TABS: 325.0000 mg | ORAL_TABLET | Freq: Four times a day (QID) | ORAL | Status: DC | PRN

## 2018-01-22 MED ORDER — SUGAMMADEX SODIUM 200 MG/2ML IV SOLN
INTRAVENOUS | Status: DC | PRN
  Administered 2018-01-22 (×2): 100 mg via INTRAVENOUS

## 2018-01-22 MED ORDER — ONDANSETRON HCL 4 MG/2ML IJ SOLN
INTRAMUSCULAR | Status: AC
  Filled 2018-01-22: qty 2

## 2018-01-22 MED ORDER — HYDROMORPHONE HCL 1 MG/ML IJ SOLN
INTRAMUSCULAR | Status: AC
  Filled 2018-01-22: qty 1

## 2018-01-22 MED ORDER — LOSARTAN POTASSIUM 50 MG PO TABS: 100.0000 mg | ORAL_TABLET | Freq: Every day | ORAL | Status: DC

## 2018-01-22 MED ORDER — POVIDONE-IODINE 10 % EX SWAB: 2.0000 "application " | Freq: Once | CUTANEOUS | Status: DC

## 2018-01-22 MED ORDER — ADULT MULTIVITAMIN W/MINERALS CH
1.0000 | ORAL_TABLET | Freq: Every day | ORAL | Status: DC
  Administered 2018-01-22 – 2018-01-23 (×2): 1 via ORAL
  Filled 2018-01-22 (×3): qty 1

## 2018-01-22 MED ORDER — LACTATED RINGERS IV SOLN
INTRAVENOUS | Status: DC
  Administered 2018-01-22: 18:00:00 via INTRAVENOUS

## 2018-01-22 MED ORDER — POLYETHYLENE GLYCOL 3350 17 G PO PACK: 17.0000 g | PACK | Freq: Every day | ORAL | Status: DC | PRN

## 2018-01-22 MED ORDER — LORAZEPAM 1 MG PO TABS: 1.0000 mg | ORAL_TABLET | Freq: Four times a day (QID) | ORAL | Status: DC | PRN

## 2018-01-22 MED ORDER — LIDOCAINE 2% (20 MG/ML) 5 ML SYRINGE
INTRAMUSCULAR | Status: DC | PRN
  Administered 2018-01-22: 100 mg via INTRAVENOUS

## 2018-01-22 MED ORDER — MIDAZOLAM HCL 2 MG/2ML IJ SOLN
INTRAMUSCULAR | Status: AC
  Filled 2018-01-22: qty 2

## 2018-01-22 MED ORDER — ASPIRIN EC 81 MG PO TBEC: 81.0000 mg | DELAYED_RELEASE_TABLET | Freq: Two times a day (BID) | ORAL | 0 refills | Status: DC

## 2018-01-22 MED ORDER — TRAMADOL HCL 50 MG PO TABS
50.0000 mg | ORAL_TABLET | Freq: Four times a day (QID) | ORAL | Status: DC
  Administered 2018-01-22 – 2018-01-23 (×4): 50 mg via ORAL
  Filled 2018-01-22 (×4): qty 1

## 2018-01-22 MED ORDER — ONDANSETRON HCL 4 MG/2ML IJ SOLN: 4.0000 mg | Freq: Four times a day (QID) | INTRAMUSCULAR | Status: DC | PRN

## 2018-01-22 MED ORDER — FENTANYL CITRATE (PF) 250 MCG/5ML IJ SOLN
INTRAMUSCULAR | Status: AC
  Filled 2018-01-22: qty 5

## 2018-01-22 MED ORDER — AMLODIPINE BESYLATE 10 MG PO TABS
10.0000 mg | ORAL_TABLET | Freq: Every day | ORAL | Status: DC
  Administered 2018-01-23: 10 mg via ORAL
  Filled 2018-01-22 (×2): qty 1

## 2018-01-22 MED ORDER — FENTANYL CITRATE (PF) 100 MCG/2ML IJ SOLN
INTRAMUSCULAR | Status: DC | PRN
  Administered 2018-01-22 (×3): 50 ug via INTRAVENOUS
  Administered 2018-01-22: 100 ug via INTRAVENOUS

## 2018-01-22 MED ORDER — CLONIDINE HCL 0.2 MG PO TABS
0.1000 mg | ORAL_TABLET | Freq: Once | ORAL | Status: DC
  Administered 2018-01-22: 0.1 mg via ORAL
  Filled 2018-01-22: qty 0.5

## 2018-01-22 MED ORDER — ONDANSETRON HCL 4 MG PO TABS: 4.0000 mg | ORAL_TABLET | Freq: Four times a day (QID) | ORAL | Status: DC | PRN

## 2018-01-22 MED ORDER — CHLORHEXIDINE GLUCONATE 4 % EX LIQD
60.0000 mL | Freq: Once | CUTANEOUS | Status: AC
  Administered 2018-01-22: 4 via TOPICAL
  Filled 2018-01-22: qty 60

## 2018-01-22 MED ORDER — CLONIDINE HCL 0.1 MG PO TABS
0.1000 mg | ORAL_TABLET | Freq: Two times a day (BID) | ORAL | Status: DC
  Administered 2018-01-22 – 2018-01-23 (×2): 0.1 mg via ORAL
  Filled 2018-01-22 (×2): qty 1

## 2018-01-22 MED ORDER — CEFAZOLIN SODIUM-DEXTROSE 2-4 GM/100ML-% IV SOLN
2.0000 g | INTRAVENOUS | Status: AC
  Administered 2018-01-22: 2 g via INTRAVENOUS
  Filled 2018-01-22: qty 100

## 2018-01-22 MED ORDER — DEXAMETHASONE SODIUM PHOSPHATE 10 MG/ML IJ SOLN
INTRAMUSCULAR | Status: AC
  Filled 2018-01-22: qty 1

## 2018-01-22 MED ORDER — DOCUSATE SODIUM 100 MG PO CAPS: 100.0000 mg | ORAL_CAPSULE | Freq: Two times a day (BID) | ORAL | 0 refills | Status: DC

## 2018-01-22 MED ORDER — CEFAZOLIN SODIUM-DEXTROSE 2-4 GM/100ML-% IV SOLN
2.0000 g | Freq: Four times a day (QID) | INTRAVENOUS | Status: AC
  Administered 2018-01-22 – 2018-01-23 (×3): 2 g via INTRAVENOUS
  Filled 2018-01-22 (×3): qty 100

## 2018-01-22 MED ORDER — ONDANSETRON HCL 4 MG/2ML IJ SOLN
INTRAMUSCULAR | Status: DC | PRN
  Administered 2018-01-22: 4 mg via INTRAVENOUS

## 2018-01-22 MED ORDER — OXYCODONE HCL 5 MG PO TABS: 5.0000 mg | ORAL_TABLET | ORAL | 0 refills | Status: AC | PRN

## 2018-01-22 MED ORDER — METHOCARBAMOL 500 MG PO TABS: 500.0000 mg | ORAL_TABLET | Freq: Three times a day (TID) | ORAL | 0 refills | Status: DC | PRN

## 2018-01-22 MED ORDER — THIAMINE HCL 100 MG/ML IJ SOLN: 100.0000 mg | Freq: Every day | INTRAMUSCULAR | Status: DC

## 2018-01-22 MED ORDER — LIDOCAINE 2% (20 MG/ML) 5 ML SYRINGE
INTRAMUSCULAR | Status: AC
  Filled 2018-01-22: qty 5

## 2018-01-22 MED ORDER — ACETAMINOPHEN 500 MG PO TABS: 1000.0000 mg | ORAL_TABLET | Freq: Three times a day (TID) | ORAL | 0 refills | Status: AC

## 2018-01-22 MED ORDER — DEXAMETHASONE SODIUM PHOSPHATE 10 MG/ML IJ SOLN
INTRAMUSCULAR | Status: DC | PRN
  Administered 2018-01-22: 10 mg via INTRAVENOUS

## 2018-01-22 MED ORDER — PROPOFOL 10 MG/ML IV BOLUS
INTRAVENOUS | Status: AC
  Filled 2018-01-22: qty 40

## 2018-01-22 MED ORDER — KETAMINE HCL 10 MG/ML IJ SOLN
INTRAMUSCULAR | Status: DC | PRN
  Administered 2018-01-22: 50 mg via INTRAVENOUS

## 2018-01-22 MED ORDER — VITAMIN B-1 100 MG PO TABS
100.0000 mg | ORAL_TABLET | Freq: Every day | ORAL | Status: DC
  Administered 2018-01-22 – 2018-01-23 (×2): 100 mg via ORAL
  Filled 2018-01-22 (×2): qty 1

## 2018-01-22 MED ORDER — MIDAZOLAM HCL 5 MG/5ML IJ SOLN
INTRAMUSCULAR | Status: DC | PRN
  Administered 2018-01-22: 2 mg via INTRAVENOUS

## 2018-01-22 MED ORDER — PROPOFOL 10 MG/ML IV BOLUS
INTRAVENOUS | Status: DC | PRN
  Administered 2018-01-22: 300 mg via INTRAVENOUS

## 2018-01-22 MED ORDER — GABAPENTIN 300 MG PO CAPS: 300.0000 mg | ORAL_CAPSULE | Freq: Three times a day (TID) | ORAL | 0 refills | Status: DC

## 2018-01-22 MED ORDER — ACETAMINOPHEN 500 MG PO TABS
1000.0000 mg | ORAL_TABLET | Freq: Three times a day (TID) | ORAL | Status: DC
  Administered 2018-01-22 – 2018-01-23 (×2): 1000 mg via ORAL
  Filled 2018-01-22 (×3): qty 2

## 2018-01-22 MED ORDER — KETOROLAC TROMETHAMINE 15 MG/ML IJ SOLN
15.0000 mg | Freq: Four times a day (QID) | INTRAMUSCULAR | Status: AC
  Administered 2018-01-22 – 2018-01-23 (×4): 15 mg via INTRAVENOUS
  Filled 2018-01-22 (×4): qty 1

## 2018-01-22 MED ORDER — KETAMINE HCL 50 MG/5ML IJ SOSY
PREFILLED_SYRINGE | INTRAMUSCULAR | Status: AC
  Filled 2018-01-22: qty 5

## 2018-01-22 MED ORDER — GABAPENTIN 300 MG PO CAPS
300.0000 mg | ORAL_CAPSULE | Freq: Three times a day (TID) | ORAL | Status: DC
  Administered 2018-01-22 – 2018-01-23 (×3): 300 mg via ORAL
  Filled 2018-01-22 (×3): qty 1

## 2018-01-22 MED ORDER — LACTATED RINGERS IV SOLN
INTRAVENOUS | Status: DC
  Administered 2018-01-22 (×2): via INTRAVENOUS

## 2018-01-22 MED ORDER — FOLIC ACID 1 MG PO TABS
1.0000 mg | ORAL_TABLET | Freq: Every day | ORAL | Status: DC
  Administered 2018-01-22 – 2018-01-23 (×2): 1 mg via ORAL
  Filled 2018-01-22 (×2): qty 1

## 2018-01-22 MED ORDER — PROMETHAZINE HCL 25 MG/ML IJ SOLN: 6.2500 mg | INTRAMUSCULAR | Status: DC | PRN

## 2018-01-22 MED ORDER — LORAZEPAM 2 MG/ML IJ SOLN: 1.0000 mg | Freq: Four times a day (QID) | INTRAMUSCULAR | Status: DC | PRN

## 2018-01-22 MED ORDER — ENOXAPARIN SODIUM 40 MG/0.4ML ~~LOC~~ SOLN
40.0000 mg | SUBCUTANEOUS | Status: DC
  Administered 2018-01-23: 40 mg via SUBCUTANEOUS
  Filled 2018-01-22: qty 0.4

## 2018-01-22 MED ORDER — METOCLOPRAMIDE HCL 5 MG/ML IJ SOLN: 5.0000 mg | Freq: Three times a day (TID) | INTRAMUSCULAR | Status: DC | PRN

## 2018-01-22 MED ORDER — ROCURONIUM BROMIDE 50 MG/5ML IV SOSY
PREFILLED_SYRINGE | INTRAVENOUS | Status: AC
  Filled 2018-01-22: qty 5

## 2018-01-22 MED ORDER — METOCLOPRAMIDE HCL 5 MG PO TABS: 5.0000 mg | ORAL_TABLET | Freq: Three times a day (TID) | ORAL | Status: DC | PRN

## 2018-01-22 MED ORDER — HYDROMORPHONE HCL 1 MG/ML IJ SOLN
0.2500 mg | INTRAMUSCULAR | Status: DC | PRN
  Administered 2018-01-22 (×4): 0.5 mg via INTRAVENOUS

## 2018-01-22 MED ORDER — 0.9 % SODIUM CHLORIDE (POUR BTL) OPTIME
TOPICAL | Status: DC | PRN
  Administered 2018-01-22: 1000 mL

## 2018-01-22 MED ORDER — ONDANSETRON HCL 4 MG PO TABS: 4.0000 mg | ORAL_TABLET | Freq: Three times a day (TID) | ORAL | 0 refills | Status: DC | PRN

## 2018-01-22 MED ORDER — METOPROLOL TARTRATE 25 MG PO TABS
25.0000 mg | ORAL_TABLET | Freq: Two times a day (BID) | ORAL | Status: DC
  Administered 2018-01-22 – 2018-01-23 (×2): 25 mg via ORAL
  Filled 2018-01-22 (×2): qty 1

## 2018-01-22 SURGICAL SUPPLY — 69 items
BANDAGE ACE 4X5 VEL STRL LF (GAUZE/BANDAGES/DRESSINGS) IMPLANT
BANDAGE ACE 6X5 VEL STRL LF (GAUZE/BANDAGES/DRESSINGS) IMPLANT
BIT DRILL AO GAMMA 4.2X130 (BIT) ×3 IMPLANT
BIT DRILL AO GAMMA 4.2X340 (BIT) ×3 IMPLANT
BLADE SURG 10 STRL SS (BLADE) ×3 IMPLANT
BNDG COHESIVE 4X5 TAN STRL (GAUZE/BANDAGES/DRESSINGS) ×3 IMPLANT
BNDG ELASTIC 6X10 VLCR STRL LF (GAUZE/BANDAGES/DRESSINGS) ×3 IMPLANT
BNDG GAUZE ELAST 4 BULKY (GAUZE/BANDAGES/DRESSINGS) ×3 IMPLANT
CHLORAPREP W/TINT 26ML (MISCELLANEOUS) ×3 IMPLANT
COVER MAYO STAND STRL (DRAPES) ×3 IMPLANT
COVER SURGICAL LIGHT HANDLE (MISCELLANEOUS) ×6 IMPLANT
COVER WAND RF STERILE (DRAPES) ×3 IMPLANT
CUFF TOURNIQUET SINGLE 34IN LL (TOURNIQUET CUFF) IMPLANT
CUFF TOURNIQUET SINGLE 44IN (TOURNIQUET CUFF) IMPLANT
DRAPE C-ARM 42X72 X-RAY (DRAPES) ×6 IMPLANT
DRAPE C-ARMOR (DRAPES) ×3 IMPLANT
DRAPE HALF SHEET 40X57 (DRAPES) ×3 IMPLANT
DRAPE IMP U-DRAPE 54X76 (DRAPES) ×3 IMPLANT
DRESSING OPSITE X SMALL 2X3 (GAUZE/BANDAGES/DRESSINGS) IMPLANT
DRSG ADAPTIC 3X8 NADH LF (GAUZE/BANDAGES/DRESSINGS) ×6 IMPLANT
DRSG TEGADERM 4X4.75 (GAUZE/BANDAGES/DRESSINGS) ×6 IMPLANT
ELECT CAUTERY BLADE 6.4 (BLADE) ×3 IMPLANT
ELECT REM PT RETURN 9FT ADLT (ELECTROSURGICAL) ×3
ELECTRODE REM PT RTRN 9FT ADLT (ELECTROSURGICAL) ×1 IMPLANT
GAUZE SPONGE 4X4 12PLY STRL (GAUZE/BANDAGES/DRESSINGS) ×3 IMPLANT
GAUZE SPONGE 4X4 12PLY STRL LF (GAUZE/BANDAGES/DRESSINGS) ×3 IMPLANT
GLOVE BIO SURGEON STRL SZ7.5 (GLOVE) ×6 IMPLANT
GLOVE BIOGEL PI IND STRL 8 (GLOVE) ×2 IMPLANT
GLOVE BIOGEL PI INDICATOR 8 (GLOVE) ×4
GOWN STRL REUS W/ TWL LRG LVL3 (GOWN DISPOSABLE) ×3 IMPLANT
GOWN STRL REUS W/TWL LRG LVL3 (GOWN DISPOSABLE) ×6
GUIDEROD T2 3X1000 (ROD) ×3 IMPLANT
GUIDEWIRE GAMMA (WIRE) ×3 IMPLANT
K-WIRE FIXATION 3X285 COATED (WIRE) ×6
KIT BASIN OR (CUSTOM PROCEDURE TRAY) ×3 IMPLANT
KIT TURNOVER KIT B (KITS) ×3 IMPLANT
KWIRE FIXATION 3X285 COATED (WIRE) ×2 IMPLANT
MANIFOLD NEPTUNE II (INSTRUMENTS) ×3 IMPLANT
NAIL ELAS INSERT SLV SPI 8-11 (MISCELLANEOUS) ×3 IMPLANT
NAIL TIBIAL STD 9X345MM (Nail) ×3 IMPLANT
NS IRRIG 1000ML POUR BTL (IV SOLUTION) ×3 IMPLANT
PACK ORTHO EXTREMITY (CUSTOM PROCEDURE TRAY) ×3 IMPLANT
PACK UNIVERSAL I (CUSTOM PROCEDURE TRAY) ×3 IMPLANT
PAD ABD 8X10 STRL (GAUZE/BANDAGES/DRESSINGS) ×12 IMPLANT
PAD ARMBOARD 7.5X6 YLW CONV (MISCELLANEOUS) ×6 IMPLANT
PAD CAST 4YDX4 CTTN HI CHSV (CAST SUPPLIES) ×1 IMPLANT
PADDING CAST COTTON 4X4 STRL (CAST SUPPLIES) ×2
PADDING CAST COTTON 6X4 STRL (CAST SUPPLIES) ×6 IMPLANT
REAMER INTRAMEDULLARY 8MM 510 (MISCELLANEOUS) ×3 IMPLANT
SCREW GAMMA (Screw) ×3 IMPLANT
SCREW LOCKING FULL THREAD 5X52 (Screw) ×3 IMPLANT
SCREW LOCKING T2 F/T  5X37.5MM (Screw) ×2 IMPLANT
SCREW LOCKING T2 F/T  5X42.5MM (Screw) ×2 IMPLANT
SCREW LOCKING T2 F/T 5X37.5MM (Screw) ×1 IMPLANT
SCREW LOCKING T2 F/T 5X42.5MM (Screw) ×1 IMPLANT
SPONGE LAP 18X18 X RAY DECT (DISPOSABLE) ×3 IMPLANT
STAPLER VISISTAT 35W (STAPLE) IMPLANT
SUT ETHILON 3 0 PS 1 (SUTURE) ×9 IMPLANT
SUT MON AB 2-0 CT1 27 (SUTURE) ×3 IMPLANT
SUT VIC AB 0 CT1 27 (SUTURE) ×2
SUT VIC AB 0 CT1 27XBRD ANBCTR (SUTURE) ×1 IMPLANT
SYR BULB IRRIGATION 50ML (SYRINGE) ×3 IMPLANT
TOWEL OR 17X24 6PK STRL BLUE (TOWEL DISPOSABLE) ×3 IMPLANT
TOWEL OR 17X26 10 PK STRL BLUE (TOWEL DISPOSABLE) ×3 IMPLANT
TOWEL OR NON WOVEN STRL DISP B (DISPOSABLE) ×3 IMPLANT
TUBE CONNECTING 12'X1/4 (SUCTIONS) ×1
TUBE CONNECTING 12X1/4 (SUCTIONS) ×2 IMPLANT
WATER STERILE IRR 1000ML POUR (IV SOLUTION) ×3 IMPLANT
YANKAUER SUCT BULB TIP NO VENT (SUCTIONS) ×3 IMPLANT

## 2018-01-22 NOTE — Op Note (Signed)
01/21/2018 - 01/22/2018  4:05 PM  PATIENT:  Peter [name]    PRE-OPERATIVE DIAGNOSIS:  right tibia fracture  POST-OPERATIVE DIAGNOSIS:  Same  PROCEDURE:  RIGHT INTRAMEDULLARY (IM) NAIL TIBIAL  SURGEON:  Sheral Apleyimothy D Maleeha Halls, MD  ASSISTANT: Aquilla HackerHenry Martensen, PA-C, he was present and scrubbed throughout the case, critical for completion in a timely fashion, and for retraction, instrumentation, and closure.   ANESTHESIA:   General  PREOPERATIVE INDICATIONS:  Peter [name] is a  37 y.o. male with a diagnosis of right tibia fracture who failed conservative measures and elected for surgical management.    The risks benefits and alternatives were discussed with the patient preoperatively including but not limited to the risks of infection, bleeding, nerve injury, cardiopulmonary complications, the need for revision surgery, among others, and the patient was willing to proceed.  OPERATIVE IMPLANTS: Stryker T2 Tibial nail [phone]   BLOOD LOSS: minimal  COMPLICATIONS: none  TOURNIQUET TIME: none  OPERATIVE PROCEDURE:  Patient was identified in the preoperative holding area and site was marked by me He was transported to the operating theater and placed on the table in supine position taking care to pad all bony prominences. After a preincinduction time out anesthesia was induced. The right lower extremity was prepped and draped in normal sterile fashion and a pre-incision timeout was performed. Peter [name] received ancef for preoperative antibiotics.   The leg was placed over the radiolucent triangle. I then made a 4 cm incision over the patella tendon. I dissected down and incised the patella tendon taking care to not penetrate into the joint itself.   I placed a guidewire under fluoroscopic guidance just medial to lateral tibial spine. I was happy with this placement on all views. I used the entry reamer to create a path into the proximal tibia staying out of the joint itself.  I then  passed the ball tip guidewire down the tibia and across the fracture site. I held appropriate reduction confirmed on multiple views of fluoroscopy and sequentially reamed up to an appropriate size with appropriate chatter. I selected the above-sized nail and passed it down the ball-tipped guidewire and seated it completely to a was sunk into the proximal tibia.  I then used the outrigger placed a static transverse and 2 oblique proximal locking screws. As happy with her length and placement on multiple oblique fluoroscopic views.  Next I confirmed appropriate rotation of the tibia with fracture tease and orientation of the patella and toes. I then used a perfect circles technique to place a distal static interlock screw.  The wounds were then all thoroughly irrigated and closed in layers. Sterile dressing was applied and he was taken to the PACU in stable condition.  POST OPERATIVE PLAN: WBAT, DVT prophylaxis: Early ambulation, SCD's, ASA

## 2018-01-22 NOTE — Transfer of Care (Signed)
Immediate Anesthesia Transfer of Care Note  Patient: Peter [name]  Procedure(s) Performed: RIGHT INTRAMEDULLARY (IM) NAIL TIBIAL (Right Leg Lower)  Patient Location: PACU  Anesthesia Type:General  Level of Consciousness: awake, alert  and oriented  Airway & Oxygen Therapy: Patient Spontanous Breathing and Patient connected to face mask oxygen  Post-op Assessment: Report given to RN and Post -op Vital signs reviewed and stable  Post vital signs: Reviewed and stable  Last Vitals:  Vitals Value Taken Time  BP 180/121 01/22/2018  4:23 PM  Temp    Pulse 130 01/22/2018  4:23 PM  Resp 20 01/22/2018  4:23 PM  SpO2 95 % 01/22/2018  4:23 PM  Vitals shown include unvalidated device data.  Last Pain:  Vitals:   01/22/18 1041  TempSrc:   PainSc: 2       Patients Stated Pain Goal: 3 (01/22/18 [id number])  Complications: No apparent anesthesia complications

## 2018-01-22 NOTE — Anesthesia Postprocedure Evaluation (Signed)
Anesthesia Post Note  Patient: Nicholes StairsBrandon Trevizo  Procedure(s) Performed: RIGHT INTRAMEDULLARY (IM) NAIL TIBIAL (Right Leg Lower)     Patient location during evaluation: PACU Anesthesia Type: General Level of consciousness: awake and alert Pain management: pain level controlled Vital Signs Assessment: post-procedure vital signs reviewed and stable Respiratory status: spontaneous breathing, nonlabored ventilation and respiratory function stable Cardiovascular status: blood pressure returned to baseline Postop Assessment: no apparent nausea or vomiting Anesthetic complications: no    Last Vitals:  Vitals:   01/22/18 1708 01/22/18 1723  BP: (!) 173/114 (!) 176/114  Pulse: (!) 129 (!) 128  Resp: 18 20  Temp:  36.6 C  SpO2: 94% 94%    Last Pain:  Vitals:   01/22/18 1718  TempSrc:   PainSc: 7         RLE Motor Response: Purposeful movement;Responds to commands (01/22/18 1723)        Beryle Lathehomas E Daveon Arpino

## 2018-01-22 NOTE — Progress Notes (Signed)
Dr. Mal AmabileBrock made aware of pts HR in the 120s and BP 180s/120s. Order for one time dose of 0.1mg  of Clonidine PO and 25mg  of Metoprolol PO to be given now.

## 2018-01-22 NOTE — Interval H&P Note (Signed)
I participated in the care of this patient and agree with the above history, physical and evaluation. I performed a review of the history and a physical exam as detailed   Gwendolynn Merkey Daniel Dannya Pitkin MD  

## 2018-01-22 NOTE — Anesthesia Preprocedure Evaluation (Addendum)
Anesthesia Evaluation  Patient identified by MRN, date of birth, ID band Patient awake    Reviewed: Allergy & Precautions, NPO status , Patient's Chart, lab work & pertinent test results  History of Anesthesia Complications Negative for: history of anesthetic complications  Airway Mallampati: II  TM Distance: >3 FB Neck ROM: Full    Dental  (+) Dental Advisory Given, Edentulous Upper   Pulmonary Current Smoker,    breath sounds clear to auscultation       Cardiovascular hypertension, Pt. on medications and Pt. on home beta blockers  Rhythm:Regular Rate:Tachycardia     Neuro/Psych negative neurological ROS  negative psych ROS   GI/Hepatic GERD  Medicated and Controlled,(+)     substance abuse  alcohol use, Hepatitis - (alcoholic)  Endo/Other   Obesity   Renal/GU negative Renal ROS     Musculoskeletal negative musculoskeletal ROS (+) narcotic dependent  Abdominal   Peds  Hematology  Leukocytosis    Anesthesia Other Findings Previously on methadone    Reproductive/Obstetrics                            Anesthesia Physical Anesthesia Plan  ASA: III  Anesthesia Plan: General   Post-op Pain Management:    Induction: Intravenous  PONV Risk Score and Plan: 3 and Treatment may vary due to age or medical condition, Ondansetron, Midazolam and Dexamethasone  Airway Management Planned: Oral ETT  Additional Equipment: None  Intra-op Plan:   Post-operative Plan: Extubation in OR  Informed Consent: I have reviewed the patients History and Physical, chart, labs and discussed the procedure including the risks, benefits and alternatives for the proposed anesthesia with the patient or authorized representative who has indicated his/her understanding and acceptance.   Dental advisory given  Plan Discussed with: CRNA and Anesthesiologist  Anesthesia Plan Comments:         Anesthesia Quick Evaluation

## 2018-01-22 NOTE — Discharge Instructions (Signed)
Elevate leg at all times.  Pain: If needed, you may increase narcotic pain medication (oxycodone) for the first few days post op to 2 tablets every 4 hours.  Stop as needed pain medication as soon as you are able.  Diet: As you were doing prior to hospitalization   Dressing:  Keep dressings on and dry until follow up.  Activity:  Increase activity slowly as tolerated, but follow the weight bearing instructions below.  The rules on driving is that you can not be taking narcotics while you drive, and you must feel in control of the vehicle.    Weight Bearing:   As tolerated.  Wear walking boot for comfort.  To prevent constipation: you may use a stool softener such as -  Colace (over the counter) 100 mg by mouth twice a day  Drink plenty of fluids (prune juice may be helpful) and high fiber foods Miralax (over the counter) for constipation as needed.    Itching:  If you experience itching with your medications, try taking only a single pain pill, or even half a pain pill at a time.  You can also use benadryl over the counter for itching or also to help with sleep.   Precautions:  If you experience chest pain or shortness of breath - call 911 immediately for transfer to the hospital emergency department!!  If you develop a fever greater that 101 F, purulent drainage from wound, increased redness or drainage from wound, or calf pain -- Call the office at [phone]                                                 Follow- Up Appointment:  Please call for an appointment to be seen in 1-2 weeks  - [phone]

## 2018-01-22 NOTE — Consult Note (Signed)
ORTHOPAEDIC CONSULTATION  REQUESTING PHYSICIAN: Marchia Bond, MD  Chief Complaint: R tibia fracture  HPI: I reviewed and agree with below History:  Peter [name] is a 36 y.o. male who had a fall today after he apparently was drinking too much, this was at a bar, he was then found at his parents house.  He apparently did hit his head.  He currently denies any headache, he is eating dinner on the gurney.  He has a history of alcohol and substance abuse.  Pain is located over the right leg, denies any other injuries.  He denies having bleeding at the injury, no evidence for open fracture  Past Medical History:  Diagnosis Date  . Alcoholism ([place])   . Hypertension   . Substance abuse [facility])    Past Surgical History:  Procedure Laterality Date  . DENTAL SURGERY     Social History   Socioeconomic History  . Marital status: Single    Spouse name: Not on file  . Number of children: Not on file  . Years of education: Not on file  . Highest education level: Not on file  Occupational History  . Not on file  Social Needs  . Financial resource strain: Not on file  . Food insecurity:    Worry: Not on file    Inability: Not on file  . Transportation needs:    Medical: Not on file    Non-medical: Not on file  Tobacco Use  . Smoking status: Current Every Day Smoker    Packs/day: 0.50    Types: Cigarettes  . Smokeless tobacco: Never Used  Substance and Sexual Activity  . Alcohol use: Yes    Comment:    . Drug use: No  . Sexual activity: Yes    Birth control/protection: Condom  Lifestyle  . Physical activity:    Days per week: Not on file    Minutes per session: Not on file  . Stress: Not on file  Relationships  . Social connections:    Talks on phone: Not on file    Gets together: Not on file    Attends religious service: Not on file    Active member of club or organization: Not on file    Attends meetings of clubs or organizations: Not on file    Relationship  status: Not on file  Other Topics Concern  . Not on file  Social History Narrative  . Not on file   Family History  Problem Relation Age of Onset  . Hypertension Father   . Stroke Father   . CAD Father   . Cancer Paternal Grandfather        Lung  . Hyperlipidemia Paternal Grandfather   . Hypertension Paternal Grandfather    No Known Allergies Prior to Admission medications   Medication Sig Start Date End Date Taking? Authorizing Provider  amLODipine (NORVASC) 10 MG tablet Take 1 tablet (10 mg total) by mouth daily. 12/03/17 06/01/18 Yes Lanae Boast, FNP  busPIRone (BUSPAR) 7.5 MG tablet Take 1 tablet (7.5 mg total) by mouth 2 (two) times daily. 01/11/18 04/11/18 Yes Lanae Boast, FNP  cloNIDine (CATAPRES) 0.1 MG tablet Take 1 tablet (0.1 mg total) by mouth 2 (two) times daily. 01/11/18  Yes Lanae Boast, FNP  folic acid (FOLVITE) 1 MG tablet Take 1 tablet (1 mg total) by mouth daily. 05/21/17  Yes Lavina Hamman, MD  losartan (COZAAR) 100 MG tablet Take 1 tablet (100 mg total) by mouth daily.  12/03/17 06/01/18 Yes Lanae Boast, FNP  metoprolol tartrate (LOPRESSOR) 25 MG tablet Take 1 tablet (25 mg total) by mouth 2 (two) times daily. 12/14/17  Yes Lanae Boast, FNP  multivitamin (ONE-A-DAY MEN'S) TABS tablet Take 1 tablet by mouth daily.   Yes [provider]  venlafaxine XR (EFFEXOR XR) 37.5 MG 24 hr capsule Take 1 capsule (37.5 mg total) by mouth daily with breakfast. 01/11/18  Yes Lanae Boast, FNP  fenofibrate (TRICOR) 48 MG tablet Take 1 tablet (48 mg total) by mouth daily. Patient not taking: Reported on 11/28/2017 09/03/17 09/03/18  Lanae Boast, FNP  ondansetron (ZOFRAN ODT) 4 MG disintegrating tablet Take 1 tablet (4 mg total) by mouth every 8 (eight) hours as needed for nausea. Patient not taking: Reported on 01/21/2018 11/29/17   Larene Pickett, PA-C  oxyCODONE-acetaminophen (PERCOCET) 5-325 MG tablet Take 1 tablet by mouth every 4 (four) hours as  needed. Patient not taking: Reported on 01/11/2018 11/29/17   Larene Pickett, PA-C  propranolol ER (INDERAL LA) 60 MG 24 hr capsule Take 1 capsule (60 mg total) by mouth daily. Patient not taking: Reported on 12/14/2017 12/03/17   Lanae Boast, FNP  thiamine (VITAMIN B-1) 100 MG tablet Take 1 tablet (100 mg total) by mouth daily. Patient not taking: Reported on 01/21/2018 05/21/17   Lavina Hamman, MD   Dg Tibia/fibula Right  Result Date: 01/21/2018 CLINICAL DATA:  Pain after fall EXAM: RIGHT TIBIA AND FIBULA - 2 VIEW COMPARISON:  None FINDINGS: Comminuted mildly displaced distal fibular and tibial fractures. IMPRESSION: Comminuted mildly displaced distal fibular and tibial fractures. Electronically Signed   By: Dorise Bullion III M.D   On: 01/21/2018 20:16   Dg Ankle Complete Right  Result Date: 01/21/2018 CLINICAL DATA:  Pain after fall EXAM: RIGHT ANKLE - COMPLETE 3+ VIEW COMPARISON:  None. FINDINGS: Comminuted mildly displaced distal fibular and tibial diaphysis fracture is noted. No disruption of the ankle mortise noted. The fractures are above the level of the ankle. No other acute abnormalities. IMPRESSION: No ankle fractures. Fractures of the distal tibia and fibula are above the level of the ankle. Electronically Signed   By: Dorise Bullion III M.D   On: 01/21/2018 20:17   Ct Head Wo Contrast  Result Date: 01/21/2018 CLINICAL DATA:  Patient found down. EXAM: CT HEAD WITHOUT CONTRAST CT CERVICAL SPINE WITHOUT CONTRAST TECHNIQUE: Multidetector CT imaging of the head and cervical spine was performed following the standard protocol without intravenous contrast. Multiplanar CT image reconstructions of the cervical spine were also generated. COMPARISON:  None. FINDINGS: CT HEAD FINDINGS Brain: Ventricles and sulci are appropriate for patient's age. No evidence for acute cortically based infarct, intracranial hemorrhage, mass lesion or mass-effect. Vascular: Unremarkable Skull: Intact.  Sinuses/Orbits: Paranasal sinuses mastoid air cells are well aerated. Orbits are unremarkable. Other: None. CT CERVICAL SPINE FINDINGS Alignment: Reversal of the normal cervical lordosis. Skull base and vertebrae: Intact Soft tissues and spinal canal: No prevertebral fluid or swelling. No visible canal hematoma. Disc levels: Preservation of the vertebral body and intervertebral disc space heights. No acute fracture. Upper chest: Poorly visualized. Other: None. IMPRESSION: No acute intracranial process. No acute cervical spine fracture. Vasculature is relatively dense however may be secondary to dehydration. Electronically Signed   By: Lovey Newcomer M.D.   On: 01/21/2018 20:26   Ct Cervical Spine Wo Contrast  Result Date: 01/21/2018 CLINICAL DATA:  Patient found down. EXAM: CT HEAD WITHOUT CONTRAST CT CERVICAL SPINE WITHOUT CONTRAST TECHNIQUE: Multidetector CT  imaging of the head and cervical spine was performed following the standard protocol without intravenous contrast. Multiplanar CT image reconstructions of the cervical spine were also generated. COMPARISON:  None. FINDINGS: CT HEAD FINDINGS Brain: Ventricles and sulci are appropriate for patient's age. No evidence for acute cortically based infarct, intracranial hemorrhage, mass lesion or mass-effect. Vascular: Unremarkable Skull: Intact. Sinuses/Orbits: Paranasal sinuses mastoid air cells are well aerated. Orbits are unremarkable. Other: None. CT CERVICAL SPINE FINDINGS Alignment: Reversal of the normal cervical lordosis. Skull base and vertebrae: Intact Soft tissues and spinal canal: No prevertebral fluid or swelling. No visible canal hematoma. Disc levels: Preservation of the vertebral body and intervertebral disc space heights. No acute fracture. Upper chest: Poorly visualized. Other: None. IMPRESSION: No acute intracranial process. No acute cervical spine fracture. Vasculature is relatively dense however may be secondary to dehydration. Electronically  Signed   By: Lovey Newcomer M.D.   On: 01/21/2018 20:26    Positive ROS: All other systems have been reviewed and were otherwise negative with the exception of those mentioned in the HPI and as above.  Labs cbc Recent Labs    01/21/18 2007  WBC 19.1*  HGB 17.2*  HCT 51.5  PLT 337    Labs inflam No results for input(s): CRP in the last 72 hours.  Invalid input(s): ESR  Labs coag No results for input(s): INR, PTT in the last 72 hours.  Invalid input(s): PT  Recent Labs    01/21/18 2007  NA 134*  K 4.1  CL 98  CO2 20*  GLUCOSE 129*  BUN 15  CREATININE 1.24  CALCIUM 9.1    Physical Exam: Vitals:   01/21/18 2328 01/22/18 0641  BP: 122/86 (!) 150/88  Pulse: (!) 106 (!) 124  Resp: 17 18  Temp: 98.1 F (36.7 C) 99.2 F (37.3 C)  SpO2: 91% 92%   General: Alert, no acute distress Cardiovascular: No pedal edema Respiratory: No cyanosis, no use of accessory musculature GI: No organomegaly, abdomen is soft and non-tender Skin: No lesions in the area of chief complaint other than those listed below in MSK exam.  Neurologic: Sensation intact distally save for the below mentioned MSK exam Psychiatric: Patient is competent for consent with normal mood and affect Lymphatic: No axillary or cervical lymphadenopathy  MUSCULOSKELETAL:  RLE: no pain with passive toe motion. NVI to the toes.  Other extremities are atraumatic with painless ROM and NVI.  Assessment: R tibia fracture  Plan: IM nail today  CIWA post op Likely home tomorrow.    Renette Butters, MD Cell [phone]   01/22/2018 2:30 PM

## 2018-01-22 NOTE — Anesthesia Procedure Notes (Signed)
Procedure Name: Intubation Date/Time: 01/22/2018 2:40 PM Performed by: Trinna Post., CRNA Pre-anesthesia Checklist: Patient identified, Emergency Drugs available, Suction available, Patient being monitored and Timeout performed Patient Re-evaluated:Patient Re-evaluated prior to induction Oxygen Delivery Method: Circle system utilized Preoxygenation: Pre-oxygenation with 100% oxygen Induction Type: IV induction Ventilation: Mask ventilation without difficulty and Oral airway inserted - appropriate to patient size Laryngoscope Size: Mac and 4 Grade View: Grade I Tube type: Oral Tube size: 7.5 mm Number of attempts: 1 Airway Equipment and Method: Stylet Placement Confirmation: ETT inserted through vocal cords under direct vision,  positive ETCO2 and breath sounds checked- equal and bilateral Secured at: 23 cm Tube secured with: Tape Dental Injury: Teeth and Oropharynx as per pre-operative assessment

## 2018-01-22 NOTE — Plan of Care (Signed)

## 2018-01-23 ENCOUNTER — Encounter (HOSPITAL_COMMUNITY): Payer: Self-pay | Admitting: Orthopedic Surgery

## 2018-01-23 LAB — HIV ANTIBODY (ROUTINE TESTING W REFLEX): HIV Screen 4th Generation wRfx: NONREACTIVE

## 2018-01-23 NOTE — Discharge Summary (Signed)
Physician Discharge Summary  Patient ID: Peter [name] MRN: [id number] DOB/AGE: [birth year] 37 y.o.  Admit date: 01/21/2018 Discharge date: 01/23/2018  Admission Diagnoses:  <principal problem not specified>  Discharge Diagnoses:  Active Problems:   Right tibial fracture   Past Medical History:  Diagnosis Date  . Alcoholism ([facility])   . Hypertension   . Substance abuse ([facility])     Surgeries: Procedure(s): RIGHT INTRAMEDULLARY (IM) NAIL TIBIAL on 01/22/2018   Consultants (if any):   Discharged Condition: Improved  Hospital Course: Peter [name] is an 37 y.o. male who was admitted 01/21/2018 with a diagnosis of <principal problem not specified> and went to the operating room on 01/22/2018 and underwent the above named procedures.    He was given perioperative antibiotics:  Anti-infectives (From admission, onward)   Start     Dose/Rate Route Frequency Ordered Stop   01/22/18 2030  ceFAZolin (ANCEF) IVPB 2g/100 mL premix     2 g 200 mL/hr over 30 Minutes Intravenous Every 6 hours 01/22/18 1749 01/23/18 0959   01/22/18 0600  ceFAZolin (ANCEF) IVPB 2g/100 mL premix     2 g 200 mL/hr over 30 Minutes Intravenous On call to O.R. 01/22/18 0004 01/22/18 1511    .  He was given sequential compression devices, early ambulation, and ASA for DVT prophylaxis.  He benefited maximally from the hospital stay and there were no complications.    Recent vital signs:  Vitals:   01/23/18 0930 01/23/18 1137  BP: (!) 141/87 (!) 141/92  Pulse: (!) 109 79  Resp:    Temp:  98.2 F (36.8 C)  SpO2:  94%    Recent laboratory studies:  Lab Results  Component Value Date   HGB 17.2 (H) 01/21/2018   HGB 15.9 12/03/2017   HGB 14.9 11/28/2017   Lab Results  Component Value Date   WBC 19.1 (H) 01/21/2018   PLT 337 01/21/2018   Lab Results  Component Value Date   INR 1.07 05/20/2017   Lab Results  Component Value Date   NA 134 (L) 01/21/2018   K 4.1 01/21/2018   CL 98 01/21/2018    CO2 20 (L) 01/21/2018   BUN 15 01/21/2018   CREATININE 1.24 01/21/2018   GLUCOSE 129 (H) 01/21/2018    Discharge Medications:   ASA for dvt px  Diagnostic Studies: Dg Tibia/fibula Right  Result Date: 01/22/2018 CLINICAL DATA:  Right tibia ORIF. EXAM: RIGHT TIBIA AND FIBULA - 2 VIEW; DG C-ARM 61-120 MIN COMPARISON:  Right tibia and fibula x-rays from yesterday. FLUOROSCOPY TIME:  1 minutes, 49 seconds. C-arm fluoroscopic images were obtained intraoperatively and submitted for post operative interpretation. FINDINGS: Multiple intraoperative fluoroscopic images demonstrate interval ORIF of the distal tibial fracture with an intramedullary nail. Alignment is anatomic. Unchanged mildly displaced comminuted distal fibular diaphyseal fracture. Unchanged minimally displaced spiral fracture of the proximal fibular diaphysis. IMPRESSION: 1. Intraoperative fluoroscopic guidance for right tibia ORIF, now in anatomic alignment. 2. Unchanged proximal and distal fibular fractures. Electronically Signed   By: Obie Dredge M.D.   On: 01/22/2018 16:12   Dg Tibia/fibula Right  Result Date: 01/21/2018 CLINICAL DATA:  Pain after fall EXAM: RIGHT TIBIA AND FIBULA - 2 VIEW COMPARISON:  None FINDINGS: Comminuted mildly displaced distal fibular and tibial fractures. IMPRESSION: Comminuted mildly displaced distal fibular and tibial fractures. Electronically Signed   By: Gerome Sam III M.D   On: 01/21/2018 20:16   Dg Ankle Complete Right  Result Date: 01/21/2018 CLINICAL DATA:  Pain after fall  EXAM: RIGHT ANKLE - COMPLETE 3+ VIEW COMPARISON:  None. FINDINGS: Comminuted mildly displaced distal fibular and tibial diaphysis fracture is noted. No disruption of the ankle mortise noted. The fractures are above the level of the ankle. No other acute abnormalities. IMPRESSION: No ankle fractures. Fractures of the distal tibia and fibula are above the level of the ankle. Electronically Signed   By: Gerome Samavid  Williams III M.D    On: 01/21/2018 20:17   Ct Head Wo Contrast  Result Date: 01/21/2018 CLINICAL DATA:  Patient found down. EXAM: CT HEAD WITHOUT CONTRAST CT CERVICAL SPINE WITHOUT CONTRAST TECHNIQUE: Multidetector CT imaging of the head and cervical spine was performed following the standard protocol without intravenous contrast. Multiplanar CT image reconstructions of the cervical spine were also generated. COMPARISON:  None. FINDINGS: CT HEAD FINDINGS Brain: Ventricles and sulci are appropriate for patient's age. No evidence for acute cortically based infarct, intracranial hemorrhage, mass lesion or mass-effect. Vascular: Unremarkable Skull: Intact. Sinuses/Orbits: Paranasal sinuses mastoid air cells are well aerated. Orbits are unremarkable. Other: None. CT CERVICAL SPINE FINDINGS Alignment: Reversal of the normal cervical lordosis. Skull base and vertebrae: Intact Soft tissues and spinal canal: No prevertebral fluid or swelling. No visible canal hematoma. Disc levels: Preservation of the vertebral body and intervertebral disc space heights. No acute fracture. Upper chest: Poorly visualized. Other: None. IMPRESSION: No acute intracranial process. No acute cervical spine fracture. Vasculature is relatively dense however may be secondary to dehydration. Electronically Signed   By: Annia Beltrew  Davis M.D.   On: 01/21/2018 20:26   Ct Cervical Spine Wo Contrast  Result Date: 01/21/2018 CLINICAL DATA:  Patient found down. EXAM: CT HEAD WITHOUT CONTRAST CT CERVICAL SPINE WITHOUT CONTRAST TECHNIQUE: Multidetector CT imaging of the head and cervical spine was performed following the standard protocol without intravenous contrast. Multiplanar CT image reconstructions of the cervical spine were also generated. COMPARISON:  None. FINDINGS: CT HEAD FINDINGS Brain: Ventricles and sulci are appropriate for patient's age. No evidence for acute cortically based infarct, intracranial hemorrhage, mass lesion or mass-effect. Vascular: Unremarkable  Skull: Intact. Sinuses/Orbits: Paranasal sinuses mastoid air cells are well aerated. Orbits are unremarkable. Other: None. CT CERVICAL SPINE FINDINGS Alignment: Reversal of the normal cervical lordosis. Skull base and vertebrae: Intact Soft tissues and spinal canal: No prevertebral fluid or swelling. No visible canal hematoma. Disc levels: Preservation of the vertebral body and intervertebral disc space heights. No acute fracture. Upper chest: Poorly visualized. Other: None. IMPRESSION: No acute intracranial process. No acute cervical spine fracture. Vasculature is relatively dense however may be secondary to dehydration. Electronically Signed   By: Annia Beltrew  Davis M.D.   On: 01/21/2018 20:26   Dg C-arm 1-60 Min  Result Date: 01/22/2018 CLINICAL DATA:  Right tibia ORIF. EXAM: RIGHT TIBIA AND FIBULA - 2 VIEW; DG C-ARM 61-120 MIN COMPARISON:  Right tibia and fibula x-rays from yesterday. FLUOROSCOPY TIME:  1 minutes, 49 seconds. C-arm fluoroscopic images were obtained intraoperatively and submitted for post operative interpretation. FINDINGS: Multiple intraoperative fluoroscopic images demonstrate interval ORIF of the distal tibial fracture with an intramedullary nail. Alignment is anatomic. Unchanged mildly displaced comminuted distal fibular diaphyseal fracture. Unchanged minimally displaced spiral fracture of the proximal fibular diaphysis. IMPRESSION: 1. Intraoperative fluoroscopic guidance for right tibia ORIF, now in anatomic alignment. 2. Unchanged proximal and distal fibular fractures. Electronically Signed   By: Obie DredgeWilliam T Derry M.D.   On: 01/22/2018 16:12    Disposition: Discharge disposition: 01-Home or Self Care  Follow-up Information    Sheral Apley, MD Follow up in 10 day(s).   Specialty:  Orthopedic Surgery Contact information: [address] [place] [address] [phone]            Signed: Sheral Apley 01/23/2018, 2:50 PM

## 2018-01-23 NOTE — Progress Notes (Signed)
Orthopedic Tech Progress Note Patient Details:  Peter StairsBrandon [name] Jul 10, 1980 [id number]  Ortho Devices Type of Ortho Device: Crutches Ortho Device/Splint Location: rle Ortho Device/Splint Interventions: Ordered   Post Interventions Patient Tolerated: Well Instructions Provided: Care of device, Adjustment of device Delivered to room  Trinna PostMartinez, [name] 01/23/2018, 3:58 PM

## 2018-01-23 NOTE — Progress Notes (Signed)
Orthopedic Tech Progress Note Patient Details:  Peter StairsBrandon [name] [birth year] [id number]  Ortho Devices Type of Ortho Device: CAM walker Ortho Device/Splint Location: rle Ortho Device/Splint Interventions: Ordered, Application, Adjustment   Post Interventions Patient Tolerated: Well Instructions Provided: Care of device, Adjustment of device   Trinna PostMartinez, Alysen Smylie J 01/23/2018, 10:15 AM

## 2018-01-23 NOTE — Evaluation (Signed)
Physical Therapy Evaluation Patient Details Name: Peter [name] MRN: [id number] DOB: 03-08-1980 Today's Date: 01/23/2018   History of Present Illness  37 y.o. male who had a fall today after he apparently was drinking too much, this was at a bar, he was then found at his parents house.  He apparently did hit his head. History of EtOH abuse, hypertension, substance abuse. S/p RIGHT INTRAMEDULLARY (IM) NAIL TIBIAL 01/22/18  Clinical Impression  PTA pt living with parents in single story home with 3 steps to enter and was independent, driving and working as a Administrator. Pt currently limited in safe mobility by increased R LE pain and decreased knowledge of safe use of DME. Pt is mod I for bed mobility and requires hands on min guard for transfers and ambulation and min A for ascent/descent of 2 steps. PT currently recommends no PT follow up at discharge however encouraged pt to request PT once he is out of the CAM walker to work on gait deviations. PT will follow acutely.     Follow Up Recommendations No PT follow up;Supervision/Assistance - 24 hour    Equipment Recommendations  Crutches    Recommendations for Other Services       Precautions / Restrictions Precautions Required Braces or Orthoses: Other Brace(CAM Walker) Restrictions Weight Bearing Restrictions: Yes RLE Weight Bearing: Weight bearing as tolerated      Mobility  Bed Mobility Overal bed mobility: Modified Independent             General bed mobility comments: HoB elevated, and use of bedrails to come to EoB  Transfers Overall transfer level: Needs assistance Equipment used: Rolling walker (2 wheeled);Crutches Transfers: Sit to/from Stand Sit to Stand: Min guard;Min assist         General transfer comment: Pt initially attempted to stand without AD, and quickly sat back down, pt educated in use of RW and proper hand placement fo rpower up, requiring minA fo r steadying, pt also educated in proper use of  crutches for sit<>stand and was able to perform with min guard for safety  Ambulation/Gait Ambulation/Gait assistance: Min assist Gait Distance (Feet): 20 Feet(1x10, 1x20) Assistive device: Crutches;Rolling walker (2 wheeled) Gait Pattern/deviations: Step-to pattern;Decreased step length - left;Decreased stance time - right;Trunk flexed;Decreased weight shift to right Gait velocity: slowed Gait velocity interpretation: <1.8 ft/sec, indicate of risk for recurrent falls General Gait Details: min A for steadying, initially ambulated with crutches with vc for sequencining and reduced resting on crutches in axilllary region, after stair training pt able to progress ambulation with decreased pain with use of RW, vc for sequencing and relaxation  Stairs Stairs: Yes Stairs assistance: Min assist Stair Management: No rails;Backwards;With crutches Number of Stairs: 2 General stair comments: Min A for steadyingPt educated on use of crutches to ascend 2 steps back ward and descend 2 steps forward  Wheelchair Mobility    Modified Rankin (Stroke Patients Only)       Balance Overall balance assessment: Needs assistance Sitting-balance support: Feet supported;No upper extremity supported Sitting balance-Leahy Scale: Good     Standing balance support: No upper extremity supported Standing balance-Leahy Scale: Fair                               Pertinent Vitals/Pain Pain Assessment: 0-10 Pain Score: 5  Pain Location: R knee Pain Descriptors / Indicators: Sore;Aching Pain Intervention(s): Limited activity within patient's tolerance;Monitored during session;Repositioned    Home Living Family/patient  expects to be discharged to:: Private residence Living Arrangements: Parent Available Help at Discharge: Family Type of Home: House Home Access: Stairs to enter Entrance Stairs-Rails: None Entrance Stairs-Number of Steps: 3 Home Layout: One level Home Equipment: Environmental consultantWalker - 2  wheels;Cane - single point;Shower seat      Prior Function Level of Independence: Independent                  Extremity/Trunk Assessment   Upper Extremity Assessment Upper Extremity Assessment: Overall WFL for tasks assessed    Lower Extremity Assessment Lower Extremity Assessment: RLE deficits/detail;LLE deficits/detail RLE Deficits / Details: R knee extension AROM limited by pain,  RLE: Unable to fully assess due to pain;Unable to fully assess due to immobilization       Communication   Communication: No difficulties  Cognition Arousal/Alertness: Awake/alert Behavior During Therapy: WFL for tasks assessed/performed Overall Cognitive Status: Within Functional Limits for tasks assessed                                        General Comments General comments (skin integrity, edema, etc.): Pt father present throughout session. Pt with minor edema in R knee    Exercises Total Joint Exercises Hip ABduction/ADduction: AROM;Right;5 reps;Seated Long Arc Quad: AROM;Right;Both;Seated Marching in Standing: AROM;Right;5 reps;Seated   Assessment/Plan    PT Assessment Patient needs continued PT services  PT Problem List Decreased strength;Decreased activity tolerance;Decreased balance;Decreased range of motion;Decreased mobility;Decreased safety awareness;Pain;Decreased knowledge of use of DME       PT Treatment Interventions DME instruction;Gait training;Stair training;Functional mobility training;Therapeutic activities;Therapeutic exercise;Balance training;Patient/family education    PT Goals (Current goals can be found in the Care Plan section)  Acute Rehab PT Goals Patient Stated Goal: get back to work PT Goal Formulation: With patient Time For Goal Achievement: 02/06/18 Potential to Achieve Goals: Good    Frequency Min 4X/week    AM-PAC PT "6 Clicks" Mobility  Outcome Measure Help needed turning from your back to your side while in a flat bed  without using bedrails?: A Little Help needed moving from lying on your back to sitting on the side of a flat bed without using bedrails?: A Lot Help needed moving to and from a bed to a chair (including a wheelchair)?: A Little Help needed standing up from a chair using your arms (e.g., wheelchair or bedside chair)?: A Little Help needed to walk in hospital room?: A Little Help needed climbing 3-5 steps with a railing? : A Lot 6 Click Score: 16    End of Session Equipment Utilized During Treatment: Gait belt Activity Tolerance: Patient limited by pain Patient left: in chair;with call bell/phone within reach;with family/visitor present Nurse Communication: Mobility status;Weight bearing status PT Visit Diagnosis: Unsteadiness on feet (R26.81);Other abnormalities of gait and mobility (R26.89);Muscle weakness (generalized) (M62.81);Difficulty in walking, not elsewhere classified (R26.2);Pain;History of falling (Z91.81) Pain - Right/Left: Right Pain - part of body: Ankle and joints of foot    Time: [id number] PT Time Calculation (min) (ACUTE ONLY): 39 min   Charges:   PT Evaluation $PT Eval Low Complexity: 1 Low PT Treatments $Gait Training: 23-37 mins        Cammy Sanjurjo B. Beverely RisenVan Fleet PT, DPT Acute Rehabilitation Services Pager [phone] Office [phone]   Elon Alaslizabeth B Van Fleet 01/23/2018, 1:41 PM

## 2018-01-23 NOTE — Progress Notes (Signed)
AVS given and reviewed with pt. Printed prescriptions discussed and given to pt. Crutches at bedside. Cam walker boot on. All questions answered to satisfaction. Pt escorted off the unit via wheelchair by staff member.

## 2018-01-23 NOTE — Plan of Care (Signed)
Problem: Education: Goal: Knowledge of General Education information will improve Description Including pain rating scale, medication(s)/side effects and non-pharmacologic comfort measures Outcome: Progressing   Problem: Health Behavior/Discharge Planning: Goal: Ability to manage health-related needs will improve Outcome: Progressing   Problem: Clinical Measurements: Goal: Respiratory complications will improve Outcome: Progressing   Problem: Nutrition: Goal: Adequate nutrition will be maintained Outcome: Progressing   Problem: Coping: Goal: Level of anxiety will decrease Outcome: Progressing   Problem: Elimination: Goal: Will not experience complications related to urinary retention Outcome: Progressing   Problem: Pain Managment: Goal: General experience of comfort will improve Outcome: Progressing   Problem: Safety: Goal: Ability to remain free from injury will improve Outcome: Progressing   Problem: Skin Integrity: Goal: Risk for impaired skin integrity will decrease Outcome: Progressing   

## 2018-04-05 ENCOUNTER — Other Ambulatory Visit: Payer: Self-pay | Admitting: Family Medicine

## 2018-04-05 DIAGNOSIS — F32A Depression, unspecified: Secondary | ICD-10-CM

## 2018-04-05 DIAGNOSIS — I10 Essential (primary) hypertension: Secondary | ICD-10-CM

## 2018-04-05 DIAGNOSIS — F329 Major depressive disorder, single episode, unspecified: Secondary | ICD-10-CM

## 2018-04-15 ENCOUNTER — Ambulatory Visit: Payer: Self-pay | Admitting: Family Medicine

## 2018-04-28 ENCOUNTER — Other Ambulatory Visit: Payer: Self-pay

## 2018-04-28 ENCOUNTER — Emergency Department (HOSPITAL_COMMUNITY)
Admission: EM | Admit: 2018-04-28 | Discharge: 2018-04-28 | Disposition: A | Discharge status: Home / Self Care | Payer: Self-pay | Attending: Emergency Medicine | Admitting: Emergency Medicine

## 2018-04-28 ENCOUNTER — Emergency Department (HOSPITAL_COMMUNITY): Payer: Self-pay

## 2018-04-28 ENCOUNTER — Encounter (HOSPITAL_COMMUNITY): Payer: Self-pay

## 2018-04-28 DIAGNOSIS — Y998 Other external cause status: Secondary | ICD-10-CM | POA: Insufficient documentation

## 2018-04-28 DIAGNOSIS — W19XXXA Unspecified fall, initial encounter: Secondary | ICD-10-CM | POA: Insufficient documentation

## 2018-04-28 DIAGNOSIS — I10 Essential (primary) hypertension: Secondary | ICD-10-CM | POA: Insufficient documentation

## 2018-04-28 DIAGNOSIS — F17210 Nicotine dependence, cigarettes, uncomplicated: Secondary | ICD-10-CM | POA: Insufficient documentation

## 2018-04-28 DIAGNOSIS — M79604 Pain in right leg: Secondary | ICD-10-CM | POA: Insufficient documentation

## 2018-04-28 DIAGNOSIS — Y92013 Bedroom of single-family (private) house as the place of occurrence of the external cause: Secondary | ICD-10-CM | POA: Insufficient documentation

## 2018-04-28 DIAGNOSIS — Z79899 Other long term (current) drug therapy: Secondary | ICD-10-CM | POA: Insufficient documentation

## 2018-04-28 DIAGNOSIS — Y9389 Activity, other specified: Secondary | ICD-10-CM | POA: Insufficient documentation

## 2018-04-28 NOTE — ED Notes (Signed)
Patient verbalizes understanding of discharge instructions. Opportunity for questioning and answers were provided. Armband removed by staff, pt discharged from ED home via POV with family. 

## 2018-04-28 NOTE — Discharge Instructions (Addendum)
Your xray today was negative. Alternate between tylenol or ibuprofen for your pain. Please elevate limb while at rest, you may place ice to the area. Return to the ED if there are changes to your leg such a limb becomes cool, numb, pain is out of proportion. Otherwise follow up with your orthopedist at your schedule appointment on Friday.

## 2018-04-28 NOTE — ED Provider Notes (Signed)
[name] EMERGENCY DEPARTMENT Provider Note   CSN: [id number] Arrival date & time: 04/28/18  0046    History   Chief Complaint Chief Complaint  Patient presents with  . Leg Pain    HPI Peter [name] is a 38 y.o. male.     38 y.o male with a PMH of HTN, Alcoholism, Substance abuse presents to the ED with a chief complaint of right leg pain s/p fall. Patient reports he was trying to get in bed tonight and had fallen reports injuring his right ankle. He reports being unable to put weight on his right ankle since the accident. Patient has not taken medication for pain relieve. Reports he feels pain along the lateral and medial side at the surgical site. He denies any shortness of breath, other trauma or complaints.      Past Medical History:  Diagnosis Date  . Alcoholism (HCC)   . Hypertension   . Substance abuse [facility])     Patient Active Problem List   Diagnosis Date Noted  . Right tibial fracture 01/21/2018  . Chest pain 05/20/2017  . Hypomagnesemia 02/15/2017  . Abdominal pain 02/14/2017  . Acute pancreatitis 02/14/2017  . Alcoholic hepatitis without ascites 02/14/2017  . Hypokalemia 12/03/2016  . Leukocytosis 12/03/2016  . Alcohol withdrawal (HCC) 12/02/2016  . Alcohol dependence with other alcohol-induced disorder (HCC) 11/18/2016  . 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 Essential hypertension 11/18/2016  . Elevated liver enzymes 11/14/2016    Past Surgical History:  Procedure Laterality Date  . DENTAL SURGERY    . TIBIA IM NAIL INSERTION Right 01/22/2018   Procedure: RIGHT INTRAMEDULLARY (IM) NAIL TIBIAL;  Surgeon: Sheral Apley, MD;  Location: [facility] OR;  Service: Orthopedics;  Laterality: Right;        Home Medications    Prior to Admission medications   Medication Sig Start Date End Date Taking? Authorizing Provider  amLODipine (NORVASC) 10 MG tablet Take 1 tablet (10 mg total) by mouth daily. 12/03/17 06/01/18  Mike Gip, FNP    aspirin EC 81 MG tablet Take 1 tablet (81 mg total) by mouth 2 (two) times daily. For DVT prophylaxis for 30 days after surgery. 01/22/18   Martensen, Lucretia Kern III, PA-C  busPIRone (BUSPAR) 7.5 MG tablet TAKE 1 TABLET BY MOUTH TWICE DAILY 04/05/18   Mike Gip, FNP  cloNIDine (CATAPRES) 0.1 MG tablet TAKE 1 TABLET BY MOUTH TWICE DAILY 04/05/18   Mike Gip, FNP  docusate sodium (COLACE) 100 MG capsule Take 1 capsule (100 mg total) by mouth 2 (two) times daily. To prevent constipation while taking pain medication. 01/22/18   Albina Billet III, PA-C  folic acid (FOLVITE) 1 MG tablet Take 1 tablet (1 mg total) by mouth daily. 05/21/17   Rolly Salter, MD  gabapentin (NEURONTIN) 300 MG capsule Take 1 capsule (300 mg total) by mouth 3 (three) times daily for 14 days. For 2 weeks post op for pain. 01/22/18 02/05/18  Albina Billet III, PA-C  losartan (COZAAR) 100 MG tablet Take 1 tablet (100 mg total) by mouth daily. 12/03/17 06/01/18  Mike Gip, FNP  methocarbamol (ROBAXIN) 500 MG tablet Take 1 tablet (500 mg total) by mouth every 8 (eight) hours as needed for muscle spasms. 01/22/18   Albina Billet III, PA-C  metoprolol tartrate (LOPRESSOR) 25 MG tablet TAKE 1 TABLET BY MOUTH TWICE DAILY 04/05/18   Mike Gip, FNP  multivitamin (ONE-A-DAY MEN'S) TABS tablet Take 1 tablet by mouth daily.  [provider]  ondansetron (ZOFRAN) 4 MG tablet Take 1 tablet (4 mg total) by mouth every 8 (eight) hours as needed for nausea or vomiting. 01/22/18   Albina Billet III, PA-C  venlafaxine XR (EFFEXOR XR) 37.5 MG 24 hr capsule Take 1 capsule (37.5 mg total) by mouth daily with breakfast. 01/11/18   Mike Gip, FNP    Family History Family History  Problem Relation Age of Onset  . Hypertension Father   . Stroke Father   . CAD Father   . Cancer Paternal Grandfather        Lung  . Hyperlipidemia Paternal Grandfather   . Hypertension Paternal  Grandfather     Social History Social History   Tobacco Use  . Smoking status: Current Every Day Smoker    Packs/day: 0.50    Types: Cigarettes  . Smokeless tobacco: Never Used  Substance Use Topics  . Alcohol use: Yes    Comment:    . Drug use: No     Allergies   Patient has no known allergies.   Review of Systems Review of Systems  Constitutional: Negative for chills and fever.  HENT: Negative for ear pain and sore throat.   Eyes: Negative for pain and visual disturbance.  Respiratory: Negative for cough and shortness of breath.   Cardiovascular: Negative for chest pain and palpitations.  Gastrointestinal: Negative for abdominal pain and vomiting.  Genitourinary: Negative for dysuria and hematuria.  Musculoskeletal: Negative for arthralgias and back pain.  Skin: Positive for color change. Negative for rash.  Neurological: Negative for seizures and syncope.  All other systems reviewed and are negative.    Physical Exam Updated Vital Signs BP 129/84 (BP Location: Right Arm)   Pulse 84   Temp 98.3 F (36.8 C) (Oral)   Resp 16   SpO2 96%   Physical Exam Vitals signs and nursing note reviewed.  Constitutional:      Appearance: He is well-developed.  HENT:     Head: Normocephalic and atraumatic.  Eyes:     General: No scleral icterus.    Pupils: Pupils are equal, round, and reactive to light.  Neck:     Musculoskeletal: Normal range of motion.  Cardiovascular:     Heart sounds: Normal heart sounds.  Pulmonary:     Effort: Pulmonary effort is normal.     Breath sounds: Normal breath sounds. No wheezing.  Chest:     Chest wall: No tenderness.  Abdominal:     General: Bowel sounds are normal. There is no distension.     Palpations: Abdomen is soft.     Tenderness: There is no abdominal tenderness.  Musculoskeletal:        General: No deformity.     Right ankle: Tenderness. Lateral malleolus and medial malleolus tenderness found.       Legs:  Skin:     General: Skin is warm and dry.  Neurological:     Mental Status: He is alert and oriented to person, place, and time.      ED Treatments / Results  Labs (all labs ordered are listed, but only abnormal results are displayed) Labs Reviewed - No data to display  EKG None  Radiology Dg Tibia/fibula Right  Result Date: 04/28/2018 CLINICAL DATA:  Patient fell re-injuring right leg. History prior fracture in December tibia fibula. EXAM: RIGHT TIBIA AND FIBULA - 2 VIEW COMPARISON:  None. FINDINGS: Healing distal fibular and tibial fractures are identified with callus formation and partial bony  bridging demonstrated of previously noted fractures. No new fracture or joint dislocation. Intact intramedullary nail within the right tibia without hardware loosening or fracture. Joint spaces are maintained about the knee and ankle. There is mild soft tissue swelling about the distal leg. IMPRESSION: Healing distal tibial and fibular fractures with interval development of callus and partial bony bridging across the known comminuted fractures of tibia and fibula. No new fracture or malalignment is seen. Mild soft tissue swelling overlying the distal leg leg may reflect superimposed posttraumatic soft tissue change. Electronically Signed   By: Tollie Eth M.D.   On: 04/28/2018 01:36    Procedures Procedures (including critical care time)  Medications Ordered in ED Medications - No data to display   Initial Impression / Assessment and Plan / ED Course  I have reviewed the triage vital signs and the nursing notes.  Pertinent labs & imaging results that were available during my care of the patient were reviewed by me and considered in my medical decision making (see chart for details).       Patient presents to the ED status post fall while trying to get in bed.  He reports unsure that whether he sprained his ankle while doing so.  He is currently postop from a tib-fib intramedullary rod followed by  Delbert Harness.  During evaluation compartments are soft, pulses are present, capillary refill is intact, patient does describe pain with dorsiflexion and plantarflexion.  Swelling noted to the area but no obvious deformity present.  X-ray of the right tib-fib showed:  Healing distal tibial and fibular fractures with interval  development of callus and partial bony bridging across the known  comminuted fractures of tibia and fibula. No new fracture or  malalignment is seen. Mild soft tissue swelling overlying the distal  leg leg may reflect superimposed posttraumatic soft tissue change.     Patient reports he has an appointment with Delbert Harness on Friday for follow-up after his surgery.  Will place patient on Aircast for comfort, he has crutches at the bedside.  No calf tenderness, low suspicion for any DVTs.  No erythema to the area, low concern for infection. Surgical scar is well appearing. Vital signs stable, patient stable for discharge.   Final Clinical Impressions(s) / ED Diagnoses   Final diagnoses:  Right leg pain    ED Discharge Orders    None       Claude Manges, PA-C 04/28/18 0804    Ward, Layla Maw, DO 04/28/18 [phone]

## 2018-04-28 NOTE — ED Triage Notes (Signed)
Pt states that he broke his R tib/fib in December, fell tonight after ETOH use, swelling and deformity noted to lower leg. Good pulses

## 2018-05-15 ENCOUNTER — Other Ambulatory Visit: Payer: Self-pay | Admitting: Family Medicine

## 2018-05-15 DIAGNOSIS — F32A Depression, unspecified: Secondary | ICD-10-CM

## 2018-05-15 DIAGNOSIS — F329 Major depressive disorder, single episode, unspecified: Secondary | ICD-10-CM

## 2018-05-15 DIAGNOSIS — I10 Essential (primary) hypertension: Secondary | ICD-10-CM

## 2018-06-13 ENCOUNTER — Other Ambulatory Visit: Payer: Self-pay | Admitting: Family Medicine

## 2018-06-13 DIAGNOSIS — F329 Major depressive disorder, single episode, unspecified: Secondary | ICD-10-CM

## 2018-06-13 DIAGNOSIS — F32A Depression, unspecified: Secondary | ICD-10-CM

## 2018-06-13 DIAGNOSIS — I10 Essential (primary) hypertension: Secondary | ICD-10-CM

## 2018-07-22 ENCOUNTER — Other Ambulatory Visit: Payer: Self-pay | Admitting: Family Medicine

## 2018-07-22 DIAGNOSIS — I10 Essential (primary) hypertension: Secondary | ICD-10-CM

## 2018-07-22 DIAGNOSIS — F32A Depression, unspecified: Secondary | ICD-10-CM

## 2018-07-22 DIAGNOSIS — F329 Major depressive disorder, single episode, unspecified: Secondary | ICD-10-CM

## 2018-09-04 ENCOUNTER — Other Ambulatory Visit: Payer: Self-pay | Admitting: Family Medicine

## 2018-09-04 DIAGNOSIS — F32A Depression, unspecified: Secondary | ICD-10-CM

## 2018-09-04 DIAGNOSIS — F329 Major depressive disorder, single episode, unspecified: Secondary | ICD-10-CM

## 2018-09-04 DIAGNOSIS — I10 Essential (primary) hypertension: Secondary | ICD-10-CM

## 2018-09-06 ENCOUNTER — Other Ambulatory Visit: Payer: Self-pay | Admitting: Family Medicine

## 2018-09-06 DIAGNOSIS — I10 Essential (primary) hypertension: Secondary | ICD-10-CM

## 2018-09-06 DIAGNOSIS — F32A Depression, unspecified: Secondary | ICD-10-CM

## 2018-09-06 DIAGNOSIS — F329 Major depressive disorder, single episode, unspecified: Secondary | ICD-10-CM

## 2018-10-13 DIAGNOSIS — F331 Major depressive disorder, recurrent, moderate: Secondary | ICD-10-CM | POA: Insufficient documentation

## 2018-10-16 ENCOUNTER — Other Ambulatory Visit: Payer: Self-pay | Admitting: Family Medicine

## 2018-10-16 DIAGNOSIS — F329 Major depressive disorder, single episode, unspecified: Secondary | ICD-10-CM

## 2018-10-16 DIAGNOSIS — I10 Essential (primary) hypertension: Secondary | ICD-10-CM

## 2018-10-16 DIAGNOSIS — F32A Depression, unspecified: Secondary | ICD-10-CM

## 2018-10-30 ENCOUNTER — Encounter (HOSPITAL_COMMUNITY): Payer: Self-pay

## 2018-10-30 ENCOUNTER — Encounter (HOSPITAL_COMMUNITY): Payer: Self-pay | Admitting: *Deleted

## 2018-11-12 ENCOUNTER — Other Ambulatory Visit: Payer: Self-pay | Admitting: Family Medicine

## 2018-11-12 DIAGNOSIS — F32A Depression, unspecified: Secondary | ICD-10-CM

## 2018-11-12 DIAGNOSIS — I10 Essential (primary) hypertension: Secondary | ICD-10-CM

## 2018-11-12 DIAGNOSIS — F329 Major depressive disorder, single episode, unspecified: Secondary | ICD-10-CM

## 2018-12-09 ENCOUNTER — Other Ambulatory Visit: Payer: Self-pay | Admitting: Internal Medicine

## 2018-12-09 DIAGNOSIS — F329 Major depressive disorder, single episode, unspecified: Secondary | ICD-10-CM

## 2018-12-09 DIAGNOSIS — I10 Essential (primary) hypertension: Secondary | ICD-10-CM

## 2018-12-09 DIAGNOSIS — F32A Depression, unspecified: Secondary | ICD-10-CM

## 2018-12-09 NOTE — Telephone Encounter (Signed)
Patient not seen since 01/11/18. Will route to PCP.

## 2018-12-20 ENCOUNTER — Telehealth: Payer: Self-pay

## 2018-12-20 NOTE — Telephone Encounter (Signed)
Received a fax for refills from [place]. Patient has not been seen since 12/2017. I have denied these request. I have called and left a message for patient advising him they have been denied until he schedules appointment then we will give him enough to make it to appointment. Thanks!

## 2018-12-30 ENCOUNTER — Other Ambulatory Visit: Payer: Self-pay

## 2018-12-30 DIAGNOSIS — F32A Depression, unspecified: Secondary | ICD-10-CM

## 2018-12-30 DIAGNOSIS — F329 Major depressive disorder, single episode, unspecified: Secondary | ICD-10-CM

## 2018-12-30 DIAGNOSIS — I10 Essential (primary) hypertension: Secondary | ICD-10-CM

## 2018-12-30 MED ORDER — VENLAFAXINE HCL ER 37.5 MG PO CP24: ORAL_CAPSULE | ORAL | 0 refills | Status: DC

## 2018-12-30 MED ORDER — BUSPIRONE HCL 7.5 MG PO TABS: 7.5000 mg | ORAL_TABLET | Freq: Two times a day (BID) | ORAL | 0 refills | Status: DC

## 2018-12-30 MED ORDER — METOPROLOL TARTRATE 25 MG PO TABS: 25.0000 mg | ORAL_TABLET | Freq: Two times a day (BID) | ORAL | 0 refills | Status: DC

## 2018-12-30 MED ORDER — CLONIDINE HCL 0.1 MG PO TABS: 0.1000 mg | ORAL_TABLET | Freq: Two times a day (BID) | ORAL | 0 refills | Status: DC

## 2019-01-28 ENCOUNTER — Encounter: Payer: Self-pay | Admitting: Family Medicine

## 2019-01-28 ENCOUNTER — Ambulatory Visit (INDEPENDENT_AMBULATORY_CARE_PROVIDER_SITE_OTHER): Payer: Self-pay | Admitting: Family Medicine

## 2019-01-28 ENCOUNTER — Other Ambulatory Visit: Payer: Self-pay

## 2019-01-28 VITALS — BP 98/74 | HR 61 | Temp 97.9°F | Ht 70.0 in | Wt 228.4 lb

## 2019-01-28 DIAGNOSIS — F329 Major depressive disorder, single episode, unspecified: Secondary | ICD-10-CM

## 2019-01-28 DIAGNOSIS — R031 Nonspecific low blood-pressure reading: Secondary | ICD-10-CM

## 2019-01-28 DIAGNOSIS — Z789 Other specified health status: Secondary | ICD-10-CM

## 2019-01-28 DIAGNOSIS — F32A Depression, unspecified: Secondary | ICD-10-CM

## 2019-01-28 DIAGNOSIS — F411 Generalized anxiety disorder: Secondary | ICD-10-CM

## 2019-01-28 DIAGNOSIS — Z7289 Other problems related to lifestyle: Secondary | ICD-10-CM

## 2019-01-28 DIAGNOSIS — Z720 Tobacco use: Secondary | ICD-10-CM

## 2019-01-28 DIAGNOSIS — F1090 Alcohol use, unspecified, uncomplicated: Secondary | ICD-10-CM

## 2019-01-28 DIAGNOSIS — I10 Essential (primary) hypertension: Secondary | ICD-10-CM

## 2019-01-28 DIAGNOSIS — Z0000 Encounter for general adult medical examination without abnormal findings: Secondary | ICD-10-CM

## 2019-01-28 DIAGNOSIS — Z09 Encounter for follow-up examination after completed treatment for conditions other than malignant neoplasm: Secondary | ICD-10-CM

## 2019-01-28 LAB — POCT URINALYSIS DIPSTICK
Bilirubin, UA: NEGATIVE
Blood, UA: NEGATIVE
Glucose, UA: NEGATIVE
Ketones, UA: NEGATIVE
Leukocytes, UA: NEGATIVE
Nitrite, UA: NEGATIVE
Protein, UA: NEGATIVE
Spec Grav, UA: 1.015 (ref 1.010–1.025)
Urobilinogen, UA: 0.2 E.U./dL
pH, UA: 6 (ref 5.0–8.0)

## 2019-01-28 LAB — POCT GLYCOSYLATED HEMOGLOBIN (HGB A1C): Hemoglobin A1C: 5.7 % — AB (ref 4.0–5.6)

## 2019-01-28 LAB — GLUCOSE, POCT (MANUAL RESULT ENTRY): POC Glucose: 105 mg/dl — AB (ref 70–99)

## 2019-01-28 MED ORDER — VENLAFAXINE HCL ER 75 MG PO CP24: 75.0000 mg | ORAL_CAPSULE | Freq: Every day | ORAL | 3 refills | Status: DC

## 2019-01-28 MED ORDER — BUSPIRONE HCL 7.5 MG PO TABS: 7.5000 mg | ORAL_TABLET | Freq: Three times a day (TID) | ORAL | 6 refills | Status: DC

## 2019-01-28 MED ORDER — AMLODIPINE BESYLATE 5 MG PO TABS: 5.0000 mg | ORAL_TABLET | Freq: Every day | ORAL | 3 refills | Status: DC

## 2019-01-28 NOTE — Progress Notes (Signed)
[facility]   Established Patient Office Visit  Subjective:  Patient ID: Peter [name], male    DOB: December 31, 1980  Age: 38 y.o. MRN: [id number]  CC:  Chief Complaint  Patient presents with  . Follow-up    former Debby Bud patient - HTN - med refills    HPI Peter [name] is a 38 year old male who presents for Follow Up today.   Past Medical History:  Diagnosis Date  . Alcoholism (HCC)   . Hypertension   . Substance abuse (HCC)    Current Status: This will be Peter [name] initial office visit with me. He was previously seeing Peter Gip, NP for his PCP needs. Since his last office visit, he is doing well with no complaints. He reports that his blood pressures have been decreased lately. He is currently smoking 1/2 pack of cigarettes daily.He drinks a few alcoholic beverages weekly. He denies visual changes, chest pain, cough, shortness of breath, heart palpitations, and falls. He has occasional headaches and dizziness with position changes. Denies severe headaches, confusion, seizures, double vision, and blurred vision, nausea and vomiting. His anxiety is increased at this time r/t family issues. He denies suicidal ideations, homicidal ideations, or auditory hallucinations. He denies fevers, chills, fatigue, recent infections, weight loss, and night sweats. No reports of GI problems such as diarrhea, and constipation. He has no reports of blood in stools, dysuria and hematuria. He denies pain today.    Past Surgical History:  Procedure Laterality Date  . DENTAL SURGERY    . TIBIA IM NAIL INSERTION Right 01/22/2018   Procedure: RIGHT INTRAMEDULLARY (IM) NAIL TIBIAL;  Surgeon: Sheral Apley, MD;  Location: [facility] OR;  Service: Orthopedics;  Laterality: Right;    Family History  Problem Relation Age of Onset  . Hypertension Father   . Stroke Father   . CAD Father   . Cancer Paternal Grandfather        Lung  . Hyperlipidemia  Paternal Grandfather   . Hypertension Paternal Grandfather     Social History   Socioeconomic History  . Marital status: Single    Spouse name: Not on file  . Number of children: Not on file  . Years of education: Not on file  . Highest education level: Not on file  Occupational History  . Not on file  Social Needs  . Financial resource strain: Not on file  . Food insecurity    Worry: Not on file    Inability: Not on file  . Transportation needs    Medical: Not on file    Non-medical: Not on file  Tobacco Use  . Smoking status: Current Every Day Smoker    Packs/day: 0.50    Types: Cigarettes  . Smokeless tobacco: Never Used  Substance and Sexual Activity  . Alcohol use: Yes    Comment:    . Drug use: No  . Sexual activity: Yes    Birth control/protection: Condom  Lifestyle  . Physical activity    Days per week: Not on file    Minutes per session: Not on file  . Stress: Not on file  Relationships  . Social Musician on phone: Not on file    Gets together: Not on file    Attends religious service: Not on file    Active member of club or organization: Not on file    Attends meetings of clubs or organizations: Not on file  Relationship status: Not on file  . Intimate partner violence    Fear of current or ex partner: Not on file    Emotionally abused: Not on file    Physically abused: Not on file    Forced sexual activity: Not on file  Other Topics Concern  . Not on file  Social History Narrative  . Not on file    Outpatient Medications Prior to Visit  Medication Sig Dispense Refill  . aspirin EC 81 MG tablet Take 1 tablet (81 mg total) by mouth 2 (two) times daily. For DVT prophylaxis for 30 days after surgery. 60 tablet 0  . cloNIDine (CATAPRES) 0.1 MG tablet Take 1 tablet (0.1 mg total) by mouth 2 (two) times daily. 60 tablet 0  . folic acid (FOLVITE) 1 MG tablet Take 1 tablet (1 mg total) by mouth daily. 30 tablet 0  . losartan (COZAAR) 100  MG tablet Take 1 tablet by mouth once daily 90 tablet 0  . metoprolol tartrate (LOPRESSOR) 25 MG tablet Take 1 tablet (25 mg total) by mouth 2 (two) times daily. 60 tablet 0  . multivitamin (ONE-A-DAY MEN'S) TABS tablet Take 1 tablet by mouth daily.    Marland Kitchen amLODipine (NORVASC) 10 MG tablet Take 1 tablet by mouth once daily 90 tablet 0  . busPIRone (BUSPAR) 7.5 MG tablet Take 1 tablet (7.5 mg total) by mouth 2 (two) times daily. 60 tablet 0  . docusate sodium (COLACE) 100 MG capsule Take 1 capsule (100 mg total) by mouth 2 (two) times daily. To prevent constipation while taking pain medication. 60 capsule 0  . methocarbamol (ROBAXIN) 500 MG tablet Take 1 tablet (500 mg total) by mouth every 8 (eight) hours as needed for muscle spasms. 40 tablet 0  . ondansetron (ZOFRAN) 4 MG tablet Take 1 tablet (4 mg total) by mouth every 8 (eight) hours as needed for nausea or vomiting. 20 tablet 0  . gabapentin (NEURONTIN) 300 MG capsule Take 1 capsule (300 mg total) by mouth 3 (three) times daily for 14 days. For 2 weeks post op for pain. 42 capsule 0  . venlafaxine XR (EFFEXOR-XR) 37.5 MG 24 hr capsule TAKE 1 CAPSULE BY MOUTH DAILY WITH BREAKFAST 30 capsule 0   No facility-administered medications prior to visit.     No Known Allergies  ROS Review of Systems  Constitutional: Negative.   HENT: Negative.   Eyes: Negative.   Respiratory: Negative.   Cardiovascular: Negative.   Gastrointestinal: Negative.   Endocrine: Negative.   Genitourinary: Negative.   Musculoskeletal: Negative.   Skin: Negative.   Allergic/Immunologic: Negative.   Neurological: Positive for dizziness (occasional ) and headaches (occasional ).  Hematological: Negative.   Psychiatric/Behavioral: Negative.        Anxiety   Objective:    Physical Exam  Constitutional: He is oriented to person, place, and time. He appears well-developed and well-nourished.  HENT:  Head: Normocephalic and atraumatic.  Eyes: Conjunctivae are  normal.  Neck: Normal range of motion. Neck supple.  Cardiovascular: Normal rate, regular rhythm, normal heart sounds and intact distal pulses.  Pulmonary/Chest: Effort normal and breath sounds normal.  Abdominal: Soft. Bowel sounds are normal.  Musculoskeletal: Normal range of motion.  Neurological: He is alert and oriented to person, place, and time. He has normal reflexes.  Skin: Skin is warm and dry.  Psychiatric: He has a normal mood and affect. His behavior is normal. Judgment and thought content normal.  Nursing note and vitals reviewed.  BP 98/74 Comment: manual reading  Pulse 61   Temp 97.9 F (36.6 C) (Oral)   Ht 5\' 10"  (1.778 m)   Wt 228 lb 6.4 oz (103.6 kg)   SpO2 95%   BMI 32.77 kg/m  Wt Readings from Last 3 Encounters:  01/28/19 228 lb 6.4 oz (103.6 kg)  01/22/18 224 lb (101.6 kg)  01/11/18 224 lb (101.6 kg)     There are no preventive care reminders to display for this patient.  There are no preventive care reminders to display for this patient.  Lab Results  Component Value Date   TSH 0.953 08/31/2017   Lab Results  Component Value Date   WBC 19.1 (H) 01/21/2018   HGB 17.2 (H) 01/21/2018   HCT 51.5 01/21/2018   MCV 92.1 01/21/2018   PLT 337 01/21/2018   Lab Results  Component Value Date   NA 134 (L) 01/21/2018   K 4.1 01/21/2018   CO2 20 (L) 01/21/2018   GLUCOSE 129 (H) 01/21/2018   BUN 15 01/21/2018   CREATININE 1.24 01/21/2018   BILITOT <0.2 12/03/2017   ALKPHOS 81 12/03/2017   AST 19 12/03/2017   ALT 28 12/03/2017   PROT 7.6 12/03/2017   ALBUMIN 4.3 12/03/2017   CALCIUM 9.1 01/21/2018   ANIONGAP 16 (H) 01/21/2018   Lab Results  Component Value Date   CHOL 299 (H) 08/31/2017   Lab Results  Component Value Date   HDL 42 08/31/2017   Lab Results  Component Value Date   LDLCALC Comment 08/31/2017   Lab Results  Component Value Date   TRIG 596 (HH) 08/31/2017   Lab Results  Component Value Date   CHOLHDL 7.1 (H) 08/31/2017    Lab Results  Component Value Date   HGBA1C 5.7 (A) 01/28/2019      Assessment & Plan:   1. Essential hypertension He will continue to take medications as prescribed, to decrease high sodium intake, excessive alcohol intake, increase potassium intake, smoking cessation, and increase physical activity of at least 30 minutes of cardio activity daily. He will continue to follow Heart Healthy or DASH diet. Amlodipine decreased to 5 mg daily.  - Urinalysis Dipstick - POCT HgB A1C - amLODipine (NORVASC) 5 MG tablet; Take 1 tablet (5 mg total) by mouth daily.  Dispense: 90 tablet; Refill: 3  2. Generalized anxiety disorder We will increase Buspar frequency to TID and Effexor dose increased to 75 mg  - busPIRone (BUSPAR) 7.5 MG tablet; Take 1 tablet (7.5 mg total) by mouth 3 (three) times daily.  Dispense: 90 tablet; Refill: 6 - venlafaxine XR (EFFEXOR XR) 75 MG 24 hr capsule; Take 1 capsule (75 mg total) by mouth daily with breakfast.  Dispense: 30 capsule; Refill: 3  3. Depression, unspecified depression type  4. Alcohol use Recently d/ced.   5. Tobacco use Recently d/ced.   6. Blood pressure decreased We will decrease Amlodipine to 5 mg daily.   7. Health care maintenance - Urinalysis Dipstick - POCT HgB A1C - Glucose (CBG)  8. Follow up He will follow up in 2 months for blood pressure recheck and assessment of anxiety medications.    Meds ordered this encounter  Medications  . busPIRone (BUSPAR) 7.5 MG tablet    Sig: Take 1 tablet (7.5 mg total) by mouth 3 (three) times daily.    Dispense:  90 tablet    Refill:  6  . venlafaxine XR (EFFEXOR XR) 75 MG 24 hr capsule    Sig: Take  1 capsule (75 mg total) by mouth daily with breakfast.    Dispense:  30 capsule    Refill:  3  . amLODipine (NORVASC) 5 MG tablet    Sig: Take 1 tablet (5 mg total) by mouth daily.    Dispense:  90 tablet    Refill:  3    Orders Placed This Encounter  Procedures  . Urinalysis Dipstick  .  POCT HgB A1C  . Glucose (CBG)    Referral Orders  No referral(s) requested today    Kathe Becton,  MSN, FNP-BC [address] [place], [place] [address] [phone] fax  Problem List Items Addressed This Visit      Cardiovascular and Mediastinum   Essential hypertension - Primary   Relevant Medications   amLODipine (NORVASC) 5 MG tablet   Other Relevant Orders   Urinalysis Dipstick (Completed)   POCT HgB A1C (Completed)    Other Visit Diagnoses    Generalized anxiety disorder       Relevant Medications   busPIRone (BUSPAR) 7.5 MG tablet   venlafaxine XR (EFFEXOR XR) 75 MG 24 hr capsule   Depression, unspecified depression type       Relevant Medications   busPIRone (BUSPAR) 7.5 MG tablet   venlafaxine XR (EFFEXOR XR) 75 MG 24 hr capsule   Alcohol use       Tobacco use       Blood pressure decreased       Health care maintenance       Relevant Orders   Urinalysis Dipstick (Completed)   POCT HgB A1C (Completed)   Glucose (CBG) (Completed)   Follow up          Meds ordered this encounter  Medications  . busPIRone (BUSPAR) 7.5 MG tablet    Sig: Take 1 tablet (7.5 mg total) by mouth 3 (three) times daily.    Dispense:  90 tablet    Refill:  6  . venlafaxine XR (EFFEXOR XR) 75 MG 24 hr capsule    Sig: Take 1 capsule (75 mg total) by mouth daily with breakfast.    Dispense:  30 capsule    Refill:  3  . amLODipine (NORVASC) 5 MG tablet    Sig: Take 1 tablet (5 mg total) by mouth daily.    Dispense:  90 tablet    Refill:  3    Follow-up: Return in about 2 months (around 03/31/2019).    Azzie Glatter, FNP

## 2019-01-29 ENCOUNTER — Other Ambulatory Visit: Payer: Self-pay | Admitting: Internal Medicine

## 2019-01-29 DIAGNOSIS — I10 Essential (primary) hypertension: Secondary | ICD-10-CM

## 2019-01-29 DIAGNOSIS — F32A Depression, unspecified: Secondary | ICD-10-CM | POA: Insufficient documentation

## 2019-01-29 DIAGNOSIS — F411 Generalized anxiety disorder: Secondary | ICD-10-CM | POA: Insufficient documentation

## 2019-01-29 DIAGNOSIS — F329 Major depressive disorder, single episode, unspecified: Secondary | ICD-10-CM | POA: Insufficient documentation

## 2019-02-24 ENCOUNTER — Other Ambulatory Visit: Payer: Self-pay | Admitting: Family Medicine

## 2019-02-24 ENCOUNTER — Other Ambulatory Visit: Payer: Self-pay | Admitting: Internal Medicine

## 2019-02-24 DIAGNOSIS — I10 Essential (primary) hypertension: Secondary | ICD-10-CM

## 2019-03-24 ENCOUNTER — Other Ambulatory Visit: Payer: Self-pay | Admitting: Family Medicine

## 2019-03-24 DIAGNOSIS — I10 Essential (primary) hypertension: Secondary | ICD-10-CM

## 2019-03-27 NOTE — Telephone Encounter (Signed)
I am no longer this patient's PCP. Please send to Crystal King, NP. Thanks

## 2019-03-31 ENCOUNTER — Ambulatory Visit: Payer: Self-pay | Admitting: Family Medicine

## 2019-04-23 ENCOUNTER — Other Ambulatory Visit: Payer: Self-pay | Admitting: Family Medicine

## 2019-04-23 DIAGNOSIS — I10 Essential (primary) hypertension: Secondary | ICD-10-CM

## 2019-05-01 ENCOUNTER — Telehealth: Payer: Self-pay | Admitting: Internal Medicine

## 2019-05-01 NOTE — Telephone Encounter (Signed)
Called pt. No answer. Left a message reminding him of appointment tomorrow

## 2019-05-02 ENCOUNTER — Ambulatory Visit: Payer: Self-pay | Admitting: Family Medicine

## 2019-05-16 ENCOUNTER — Other Ambulatory Visit: Payer: Self-pay | Admitting: Family Medicine

## 2019-05-16 DIAGNOSIS — F411 Generalized anxiety disorder: Secondary | ICD-10-CM

## 2019-05-16 DIAGNOSIS — I10 Essential (primary) hypertension: Secondary | ICD-10-CM

## 2019-05-24 ENCOUNTER — Ambulatory Visit: Payer: Self-pay | Attending: Internal Medicine

## 2019-05-24 DIAGNOSIS — Z23 Encounter for immunization: Secondary | ICD-10-CM

## 2019-05-24 NOTE — Progress Notes (Signed)
   Covid-19 Vaccination Clinic  Name:  Peter [name]    MRN: [id number] DOB: Sep 13, 1980  05/24/2019  [name] was observed post Covid-19 immunization for 15 minutes without incident. He was provided with Vaccine Information Sheet and instruction to access the V-Safe system.   [name] was instructed to call 911 with any severe reactions post vaccine: Marland Kitchen Difficulty breathing  . Swelling of face and throat  . A fast heartbeat  . A bad rash all over body  . Dizziness and weakness   Immunizations Administered    Name Date Dose VIS Date Route   Pfizer COVID-19 Vaccine 05/24/2019 10:37 AM 0.3 mL 01/31/2019 Intramuscular   Manufacturer: [employer], [employer]   Lot: [id number]   NDC: [address]-2

## 2019-06-17 ENCOUNTER — Ambulatory Visit: Payer: Self-pay | Attending: Internal Medicine

## 2019-06-17 ENCOUNTER — Other Ambulatory Visit: Payer: Self-pay | Admitting: Family Medicine

## 2019-06-17 DIAGNOSIS — I10 Essential (primary) hypertension: Secondary | ICD-10-CM

## 2019-06-17 DIAGNOSIS — F411 Generalized anxiety disorder: Secondary | ICD-10-CM

## 2019-06-17 DIAGNOSIS — Z23 Encounter for immunization: Secondary | ICD-10-CM

## 2019-06-17 NOTE — Progress Notes (Signed)
   Covid-19 Vaccination Clinic  Name:  Peter [name]    MRN: [id number] DOB: 1980-05-02  06/17/2019  [name] was observed post Covid-19 immunization for 15 minutes without incident. He was provided with Vaccine Information Sheet and instruction to access the V-Safe system.   [name] was instructed to call 911 with any severe reactions post vaccine: Marland Kitchen Difficulty breathing  . Swelling of face and throat  . A fast heartbeat  . A bad rash all over body  . Dizziness and weakness   Immunizations Administered    Name Date Dose VIS Date Route   Pfizer COVID-19 Vaccine 06/17/2019 10:20 AM 0.3 mL 04/16/2018 Intramuscular   Manufacturer: [employer], [employer]   Lot: [id number]   NDC: [address]-0

## 2019-06-18 NOTE — Telephone Encounter (Signed)
Peter [name] has no future appointments. Message left on his voicemail to call for appointment. Do you want to refill Venlafaxine, Metoprolol & Losartan.

## 2019-06-23 ENCOUNTER — Telehealth: Payer: Self-pay | Admitting: Nurse Practitioner

## 2019-06-23 NOTE — Telephone Encounter (Signed)
Call Pt left a message for Pt to call the office to schedule appointment

## 2019-08-21 ENCOUNTER — Other Ambulatory Visit: Payer: Self-pay | Admitting: Family Medicine

## 2019-08-21 DIAGNOSIS — I10 Essential (primary) hypertension: Secondary | ICD-10-CM

## 2019-08-21 DIAGNOSIS — F411 Generalized anxiety disorder: Secondary | ICD-10-CM

## 2019-08-21 NOTE — Telephone Encounter (Signed)
Please review  Thank you

## 2019-09-03 ENCOUNTER — Telehealth: Payer: Self-pay | Admitting: Family Medicine

## 2019-09-03 NOTE — Telephone Encounter (Signed)
Pt was called I left a message for him to call the office and schedule a appointment

## 2019-09-03 NOTE — Telephone Encounter (Signed)
-----   Message from Arnoldo Hooker sent at 08/29/2019  3:57 PM EDT ----- Regarding: RE: "Refills" Pt left a message to call back and schedule appt. ----- Message ----- From: Kallie Locks, FNP Sent: 08/21/2019   6:17 PM EDT To: Haynes Hoehn McWilliams Subject: "Refills"                                      Former patient of Andre's. 2nd refill request filled since last seen in office. Please inform patient that he will need to follow up with Thad Ranger, NP for Labs and assessment, for additional refills of his medications. Please schedule him with Crystal asap. Thank you.

## 2019-09-16 ENCOUNTER — Ambulatory Visit (HOSPITAL_COMMUNITY): Payer: Self-pay | Admitting: Psychiatry

## 2019-09-16 ENCOUNTER — Encounter (HOSPITAL_COMMUNITY): Payer: Self-pay | Admitting: Psychiatry

## 2019-09-17 ENCOUNTER — Ambulatory Visit (HOSPITAL_COMMUNITY): Payer: Self-pay | Admitting: Licensed Clinical Social Worker

## 2019-09-22 ENCOUNTER — Other Ambulatory Visit: Payer: Self-pay | Admitting: Family Medicine

## 2019-09-22 DIAGNOSIS — I10 Essential (primary) hypertension: Secondary | ICD-10-CM

## 2019-09-22 DIAGNOSIS — F411 Generalized anxiety disorder: Secondary | ICD-10-CM

## 2019-09-23 ENCOUNTER — Other Ambulatory Visit: Payer: Self-pay | Admitting: Family Medicine

## 2019-09-23 DIAGNOSIS — F411 Generalized anxiety disorder: Secondary | ICD-10-CM

## 2019-09-23 DIAGNOSIS — I10 Essential (primary) hypertension: Secondary | ICD-10-CM

## 2019-10-29 ENCOUNTER — Other Ambulatory Visit: Payer: Self-pay | Admitting: Family Medicine

## 2019-10-29 DIAGNOSIS — I10 Essential (primary) hypertension: Secondary | ICD-10-CM

## 2019-10-29 DIAGNOSIS — F411 Generalized anxiety disorder: Secondary | ICD-10-CM

## 2019-11-04 ENCOUNTER — Telehealth: Payer: Self-pay | Admitting: Family Medicine

## 2019-11-04 ENCOUNTER — Other Ambulatory Visit: Payer: Self-pay | Admitting: Family Medicine

## 2019-11-04 DIAGNOSIS — F411 Generalized anxiety disorder: Secondary | ICD-10-CM

## 2019-11-04 DIAGNOSIS — I10 Essential (primary) hypertension: Secondary | ICD-10-CM

## 2019-11-04 MED ORDER — LOSARTAN POTASSIUM 100 MG PO TABS: 100.0000 mg | ORAL_TABLET | Freq: Every day | ORAL | 0 refills | Status: DC

## 2019-11-04 MED ORDER — CLONIDINE HCL 0.1 MG PO TABS: 0.1000 mg | ORAL_TABLET | Freq: Two times a day (BID) | ORAL | 0 refills | Status: DC

## 2019-11-04 MED ORDER — BUSPIRONE HCL 7.5 MG PO TABS: 7.5000 mg | ORAL_TABLET | Freq: Three times a day (TID) | ORAL | 0 refills | Status: DC

## 2019-11-04 MED ORDER — VENLAFAXINE HCL ER 75 MG PO CP24: 75.0000 mg | ORAL_CAPSULE | Freq: Every day | ORAL | 0 refills | Status: DC

## 2019-11-04 MED ORDER — METOPROLOL TARTRATE 25 MG PO TABS: 25.0000 mg | ORAL_TABLET | Freq: Two times a day (BID) | ORAL | 0 refills | Status: DC

## 2019-11-05 NOTE — Telephone Encounter (Signed)
done

## 2019-11-11 ENCOUNTER — Ambulatory Visit: Payer: Self-pay | Admitting: Family Medicine

## 2020-04-03 ENCOUNTER — Other Ambulatory Visit: Payer: Self-pay

## 2020-04-03 DIAGNOSIS — N179 Acute kidney failure, unspecified: Secondary | ICD-10-CM | POA: Diagnosis not present

## 2020-04-03 DIAGNOSIS — K8521 Alcohol induced acute pancreatitis with uninfected necrosis: Secondary | ICD-10-CM | POA: Diagnosis present

## 2020-04-03 DIAGNOSIS — G9349 Other encephalopathy: Secondary | ICD-10-CM | POA: Diagnosis present

## 2020-04-03 DIAGNOSIS — R339 Retention of urine, unspecified: Secondary | ICD-10-CM | POA: Diagnosis not present

## 2020-04-03 DIAGNOSIS — F10132 Alcohol abuse with withdrawal with perceptual disturbance: Secondary | ICD-10-CM | POA: Diagnosis present

## 2020-04-03 DIAGNOSIS — J690 Pneumonitis due to inhalation of food and vomit: Secondary | ICD-10-CM | POA: Diagnosis present

## 2020-04-03 DIAGNOSIS — E876 Hypokalemia: Secondary | ICD-10-CM | POA: Diagnosis not present

## 2020-04-03 DIAGNOSIS — F17210 Nicotine dependence, cigarettes, uncomplicated: Secondary | ICD-10-CM | POA: Diagnosis present

## 2020-04-03 DIAGNOSIS — E875 Hyperkalemia: Secondary | ICD-10-CM | POA: Diagnosis not present

## 2020-04-03 DIAGNOSIS — Z823 Family history of stroke: Secondary | ICD-10-CM

## 2020-04-03 DIAGNOSIS — Z7982 Long term (current) use of aspirin: Secondary | ICD-10-CM

## 2020-04-03 DIAGNOSIS — Z23 Encounter for immunization: Secondary | ICD-10-CM

## 2020-04-03 DIAGNOSIS — R6520 Severe sepsis without septic shock: Secondary | ICD-10-CM | POA: Diagnosis present

## 2020-04-03 DIAGNOSIS — R34 Anuria and oliguria: Secondary | ICD-10-CM | POA: Diagnosis not present

## 2020-04-03 DIAGNOSIS — E871 Hypo-osmolality and hyponatremia: Secondary | ICD-10-CM | POA: Diagnosis present

## 2020-04-03 DIAGNOSIS — J9601 Acute respiratory failure with hypoxia: Secondary | ICD-10-CM | POA: Diagnosis not present

## 2020-04-03 DIAGNOSIS — F10231 Alcohol dependence with withdrawal delirium: Secondary | ICD-10-CM | POA: Diagnosis present

## 2020-04-03 DIAGNOSIS — Z83438 Family history of other disorder of lipoprotein metabolism and other lipidemia: Secondary | ICD-10-CM

## 2020-04-03 DIAGNOSIS — Z79899 Other long term (current) drug therapy: Secondary | ICD-10-CM

## 2020-04-03 DIAGNOSIS — F419 Anxiety disorder, unspecified: Secondary | ICD-10-CM | POA: Diagnosis present

## 2020-04-03 DIAGNOSIS — F32A Depression, unspecified: Secondary | ICD-10-CM | POA: Diagnosis present

## 2020-04-03 DIAGNOSIS — Z6832 Body mass index (BMI) 32.0-32.9, adult: Secondary | ICD-10-CM

## 2020-04-03 DIAGNOSIS — Z801 Family history of malignant neoplasm of trachea, bronchus and lung: Secondary | ICD-10-CM

## 2020-04-03 DIAGNOSIS — R197 Diarrhea, unspecified: Secondary | ICD-10-CM | POA: Diagnosis not present

## 2020-04-03 DIAGNOSIS — D6959 Other secondary thrombocytopenia: Secondary | ICD-10-CM | POA: Diagnosis not present

## 2020-04-03 DIAGNOSIS — Z20822 Contact with and (suspected) exposure to covid-19: Secondary | ICD-10-CM | POA: Diagnosis present

## 2020-04-03 DIAGNOSIS — I81 Portal vein thrombosis: Secondary | ICD-10-CM | POA: Diagnosis present

## 2020-04-03 DIAGNOSIS — K567 Ileus, unspecified: Secondary | ICD-10-CM | POA: Diagnosis not present

## 2020-04-03 DIAGNOSIS — E1165 Type 2 diabetes mellitus with hyperglycemia: Secondary | ICD-10-CM | POA: Diagnosis present

## 2020-04-03 DIAGNOSIS — E669 Obesity, unspecified: Secondary | ICD-10-CM | POA: Diagnosis present

## 2020-04-03 DIAGNOSIS — A419 Sepsis, unspecified organism: Principal | ICD-10-CM | POA: Diagnosis present

## 2020-04-03 DIAGNOSIS — R5381 Other malaise: Secondary | ICD-10-CM | POA: Diagnosis present

## 2020-04-03 DIAGNOSIS — K760 Fatty (change of) liver, not elsewhere classified: Secondary | ICD-10-CM | POA: Diagnosis present

## 2020-04-03 DIAGNOSIS — Z8249 Family history of ischemic heart disease and other diseases of the circulatory system: Secondary | ICD-10-CM

## 2020-04-03 DIAGNOSIS — I10 Essential (primary) hypertension: Secondary | ICD-10-CM | POA: Diagnosis present

## 2020-04-03 DIAGNOSIS — E872 Acidosis: Secondary | ICD-10-CM | POA: Diagnosis present

## 2020-04-03 DIAGNOSIS — K8000 Calculus of gallbladder with acute cholecystitis without obstruction: Secondary | ICD-10-CM | POA: Diagnosis present

## 2020-04-04 ENCOUNTER — Encounter (HOSPITAL_COMMUNITY): Payer: Self-pay

## 2020-04-04 ENCOUNTER — Inpatient Hospital Stay (HOSPITAL_COMMUNITY)
Admission: EM | Admit: 2020-04-04 | Discharge: 2020-04-15 | DRG: 871 | Disposition: A | Discharge status: Home / Self Care | Payer: Self-pay | Attending: Internal Medicine | Admitting: Internal Medicine

## 2020-04-04 ENCOUNTER — Emergency Department (HOSPITAL_COMMUNITY): Payer: Self-pay

## 2020-04-04 DIAGNOSIS — E1110 Type 2 diabetes mellitus with ketoacidosis without coma: Secondary | ICD-10-CM

## 2020-04-04 DIAGNOSIS — F411 Generalized anxiety disorder: Secondary | ICD-10-CM

## 2020-04-04 DIAGNOSIS — K852 Alcohol induced acute pancreatitis without necrosis or infection: Secondary | ICD-10-CM

## 2020-04-04 DIAGNOSIS — F10230 Alcohol dependence with withdrawal, uncomplicated: Secondary | ICD-10-CM

## 2020-04-04 DIAGNOSIS — R109 Unspecified abdominal pain: Secondary | ICD-10-CM

## 2020-04-04 DIAGNOSIS — I10 Essential (primary) hypertension: Secondary | ICD-10-CM | POA: Diagnosis present

## 2020-04-04 DIAGNOSIS — K859 Acute pancreatitis without necrosis or infection, unspecified: Secondary | ICD-10-CM | POA: Diagnosis present

## 2020-04-04 DIAGNOSIS — E8729 Other acidosis: Secondary | ICD-10-CM | POA: Insufficient documentation

## 2020-04-04 DIAGNOSIS — R0602 Shortness of breath: Secondary | ICD-10-CM

## 2020-04-04 DIAGNOSIS — E872 Acidosis, unspecified: Secondary | ICD-10-CM

## 2020-04-04 DIAGNOSIS — E1310 Other specified diabetes mellitus with ketoacidosis without coma: Secondary | ICD-10-CM

## 2020-04-04 DIAGNOSIS — F10930 Alcohol use, unspecified with withdrawal, uncomplicated: Secondary | ICD-10-CM

## 2020-04-04 DIAGNOSIS — R0902 Hypoxemia: Secondary | ICD-10-CM

## 2020-04-04 DIAGNOSIS — F10288 Alcohol dependence with other alcohol-induced disorder: Secondary | ICD-10-CM | POA: Diagnosis present

## 2020-04-04 HISTORY — DX: Type 2 diabetes mellitus with ketoacidosis without coma: E11.10

## 2020-04-04 LAB — CBC WITH DIFFERENTIAL/PLATELET
Abs Immature Granulocytes: 0.17 10*3/uL — ABNORMAL HIGH (ref 0.00–0.07)
Basophils Absolute: 0.1 10*3/uL (ref 0.0–0.1)
Basophils Relative: 0 %
Eosinophils Absolute: 0 10*3/uL (ref 0.0–0.5)
Eosinophils Relative: 0 %
HCT: 49.3 % (ref 39.0–52.0)
Hemoglobin: 17.1 g/dL — ABNORMAL HIGH (ref 13.0–17.0)
Immature Granulocytes: 1 %
Lymphocytes Relative: 3 %
Lymphs Abs: 0.8 10*3/uL (ref 0.7–4.0)
MCH: 32.4 pg (ref 26.0–34.0)
MCHC: 34.7 g/dL (ref 30.0–36.0)
MCV: 93.4 fL (ref 80.0–100.0)
Monocytes Absolute: 1.2 10*3/uL — ABNORMAL HIGH (ref 0.1–1.0)
Monocytes Relative: 5 %
Neutro Abs: 22.5 10*3/uL — ABNORMAL HIGH (ref 1.7–7.7)
Neutrophils Relative %: 91 %
Platelets: 204 10*3/uL (ref 150–400)
RBC: 5.28 MIL/uL (ref 4.22–5.81)
RDW: 13.2 % (ref 11.5–15.5)
WBC: 24.8 10*3/uL — ABNORMAL HIGH (ref 4.0–10.5)
nRBC: 0 % (ref 0.0–0.2)

## 2020-04-04 LAB — COMPREHENSIVE METABOLIC PANEL
ALT: 73 U/L — ABNORMAL HIGH (ref 0–44)
AST: 59 U/L — ABNORMAL HIGH (ref 15–41)
Albumin: 4.2 g/dL (ref 3.5–5.0)
Alkaline Phosphatase: 67 U/L (ref 38–126)
Anion gap: 23 — ABNORMAL HIGH (ref 5–15)
BUN: 9 mg/dL (ref 6–20)
CO2: 15 mmol/L — ABNORMAL LOW (ref 22–32)
Calcium: 8.8 mg/dL — ABNORMAL LOW (ref 8.9–10.3)
Chloride: 95 mmol/L — ABNORMAL LOW (ref 98–111)
Creatinine, Ser: 0.94 mg/dL (ref 0.61–1.24)
GFR, Estimated: >60 mL/min (ref >60)
Glucose, Bld: 277 mg/dL — ABNORMAL HIGH (ref 70–99)
Potassium: 3.7 mmol/L (ref 3.5–5.1)
Sodium: 133 mmol/L — ABNORMAL LOW (ref 135–145)
Total Bilirubin: 0.8 mg/dL (ref 0.3–1.2)
Total Protein: 8 g/dL (ref 6.5–8.1)

## 2020-04-04 LAB — PROTIME-INR
INR: 1.1 (ref 0.8–1.2)
Prothrombin Time: 14.1 seconds (ref 11.4–15.2)

## 2020-04-04 LAB — LIPID PANEL
Cholesterol: 128 mg/dL (ref 0–200)
HDL: 24 mg/dL — ABNORMAL LOW (ref >40)
LDL Cholesterol: 39 mg/dL (ref 0–99)
Total CHOL/HDL Ratio: 5.3 RATIO
Triglycerides: 327 mg/dL — ABNORMAL HIGH (ref <150)
VLDL: 65 mg/dL — ABNORMAL HIGH (ref 0–40)

## 2020-04-04 LAB — HIV ANTIBODY (ROUTINE TESTING W REFLEX): HIV Screen 4th Generation wRfx: NONREACTIVE

## 2020-04-04 LAB — RESP PANEL BY RT-PCR (FLU A&B, COVID) ARPGX2
Influenza A by PCR: NEGATIVE
Influenza B by PCR: NEGATIVE
SARS Coronavirus 2 by RT PCR: NEGATIVE

## 2020-04-04 LAB — ETHANOL: Alcohol, Ethyl (B): <10 mg/dL (ref <10)

## 2020-04-04 LAB — HEMOGLOBIN A1C
Hgb A1c MFr Bld: 6.6 % — ABNORMAL HIGH (ref 4.8–5.6)
Mean Plasma Glucose: 142.72 mg/dL

## 2020-04-04 LAB — GLUCOSE, CAPILLARY
Glucose-Capillary: 184 mg/dL — ABNORMAL HIGH (ref 70–99)
Glucose-Capillary: 176 mg/dL — ABNORMAL HIGH (ref 70–99)
Glucose-Capillary: 192 mg/dL — ABNORMAL HIGH (ref 70–99)

## 2020-04-04 LAB — BETA-HYDROXYBUTYRIC ACID: Beta-Hydroxybutyric Acid: 0.3 mmol/L — ABNORMAL HIGH (ref 0.05–0.27)

## 2020-04-04 LAB — PHOSPHORUS: Phosphorus: 3 mg/dL (ref 2.5–4.6)

## 2020-04-04 LAB — MRSA PCR SCREENING: MRSA by PCR: NEGATIVE

## 2020-04-04 LAB — LACTIC ACID, PLASMA
Lactic Acid, Venous: 10 mmol/L (ref 0.5–1.9)
Lactic Acid, Venous: 9.5 mmol/L (ref 0.5–1.9)
Lactic Acid, Venous: 6.6 mmol/L (ref 0.5–1.9)
Lactic Acid, Venous: 5.6 mmol/L (ref 0.5–1.9)

## 2020-04-04 LAB — MAGNESIUM: Magnesium: 1.4 mg/dL — ABNORMAL LOW (ref 1.7–2.4)

## 2020-04-04 LAB — CBG MONITORING, ED: Glucose-Capillary: 217 mg/dL — ABNORMAL HIGH (ref 70–99)

## 2020-04-04 LAB — LIPASE, BLOOD: Lipase: 1076 U/L — ABNORMAL HIGH (ref 11–51)

## 2020-04-04 IMAGING — CT CT ABD-PELV W/ CM
2 of 4 series · 15 of 46 positions shown, 17 images · IV contrast (omnipaque)
Comparison: CT Abdomen and Pelvis [DATE]. [REDACTED] [HOSPITAL] [HOSPITAL] Noncontrast CT Abdomen and Pelvis
[DATE]

CLINICAL DATA: 39-year-old male with abdominal pain. Alcohol
withdrawal.

EXAM:
CT ABDOMEN AND PELVIS WITH CONTRAST
TECHNIQUE: Multidetector CT imaging of the abdomen and pelvis was performed
using the standard protocol following bolus administration of
intravenous contrast.
CONTRAST:  100mL OMNIPAQUE IOHEXOL 300 MG/ML  SOLN

[Series 2: axial st · axial · 0.91mm/px · z∈[-556,-96]mm · 12 of 104 slices shown, 14 images]
[im 6/104  soft-tissue]
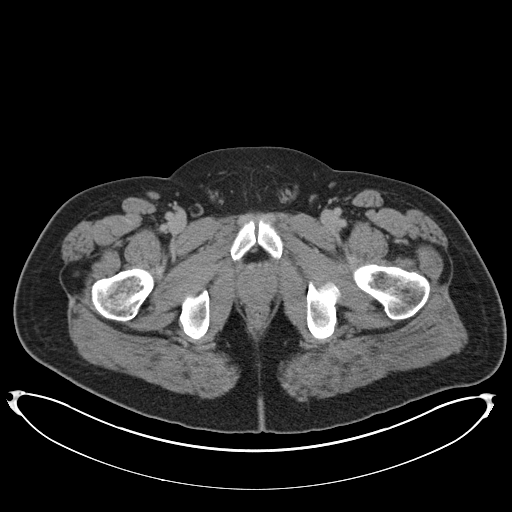
[im 6/104  bone]
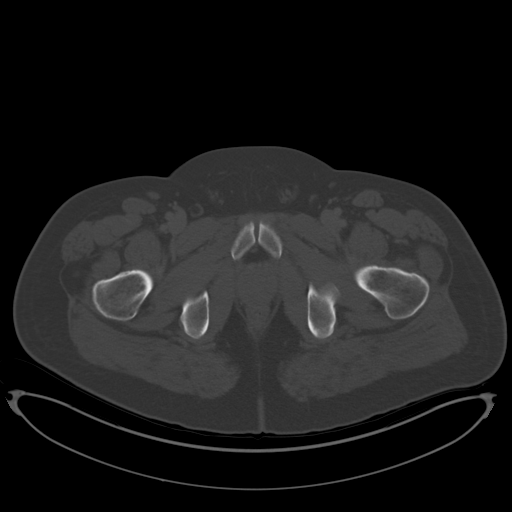
[im 18/104  soft-tissue]
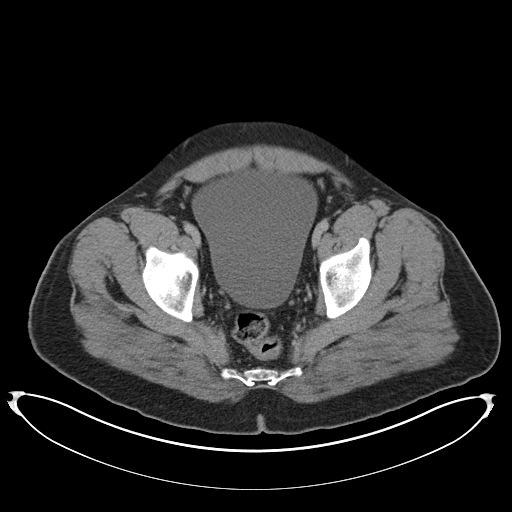
[im 23/104  soft-tissue]
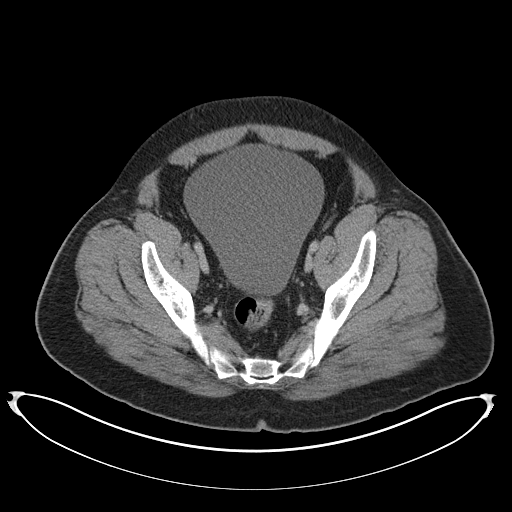
[im 29/104  soft-tissue]
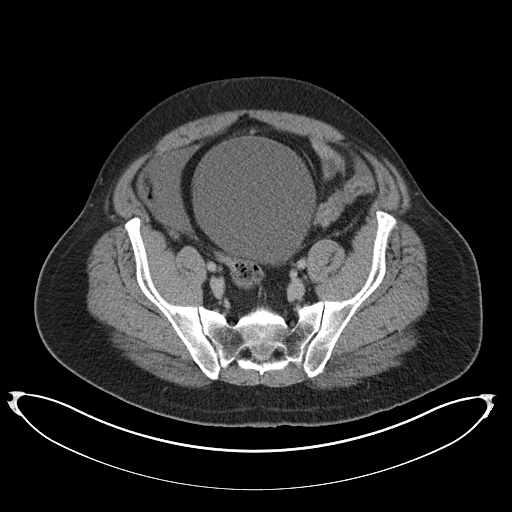
[im 41/104  soft-tissue]
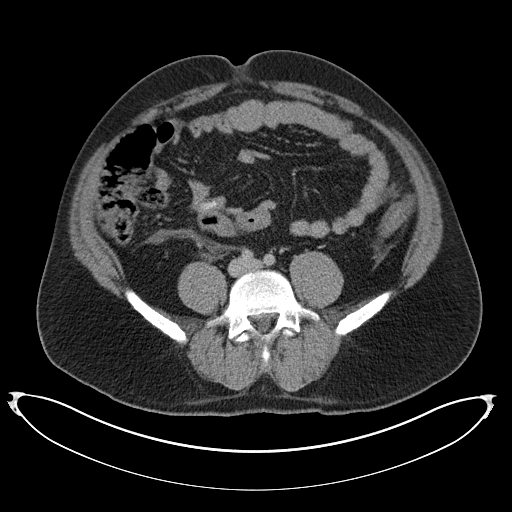
[im 46/104  soft-tissue]
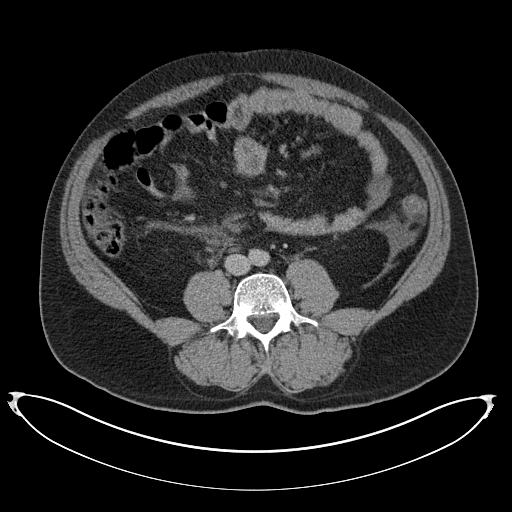
[im 58/104  soft-tissue]
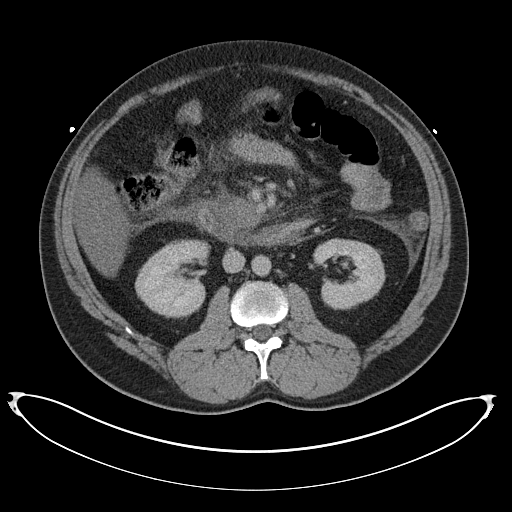
[im 63/104  soft-tissue]
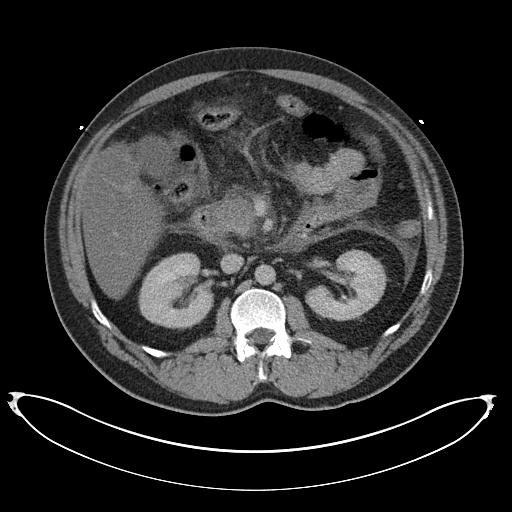
[im 75/104  soft-tissue]
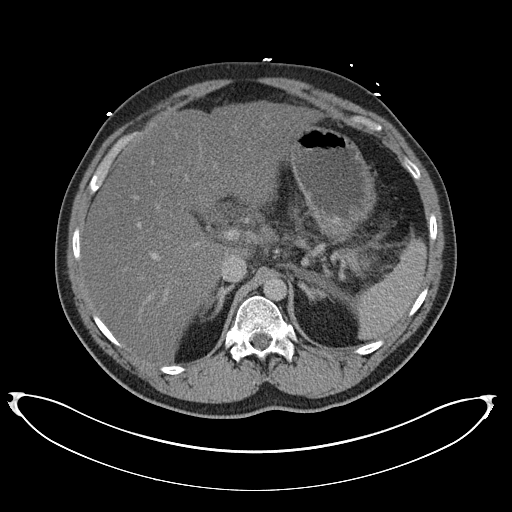
[im 75/104  bone]
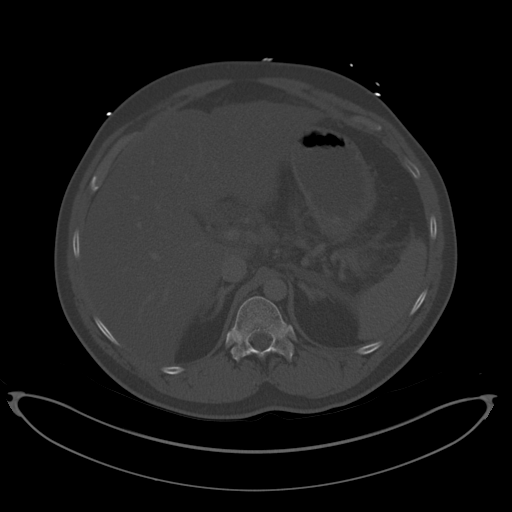
[im 81/104  soft-tissue]
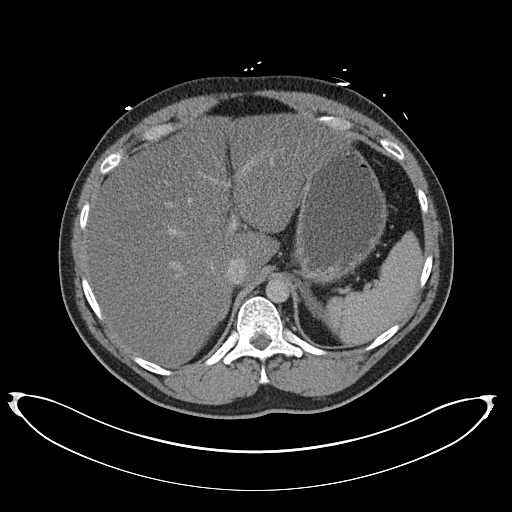
[im 86/104  soft-tissue]
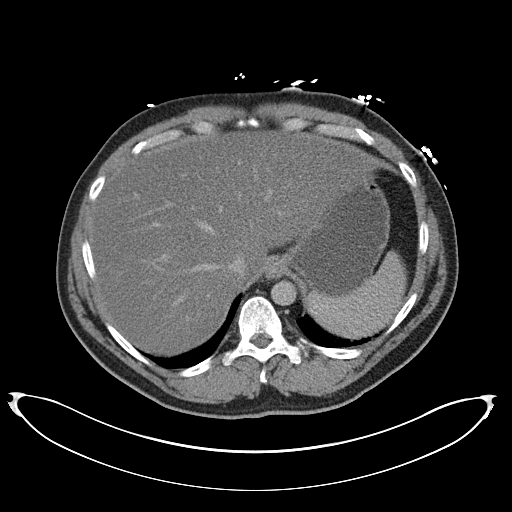
[im 98/104  soft-tissue]
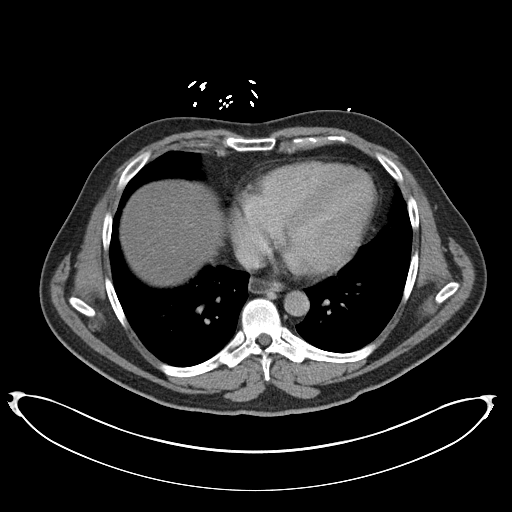

[Series 5: coronal st · coronal · 0.87mm/px · 3 of 194 slices shown]
[im 65/194  soft-tissue]
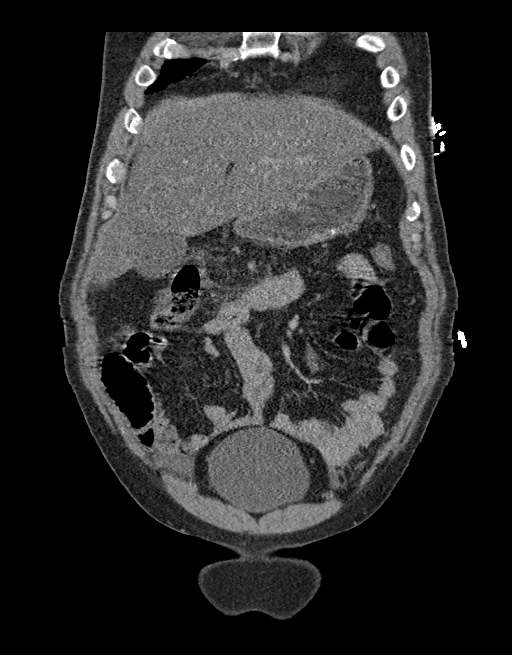
[im 86/194  soft-tissue]
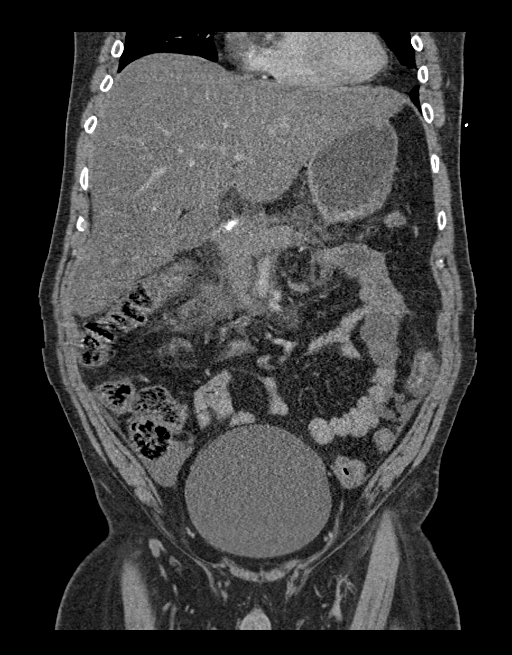
[im 108/194  soft-tissue]
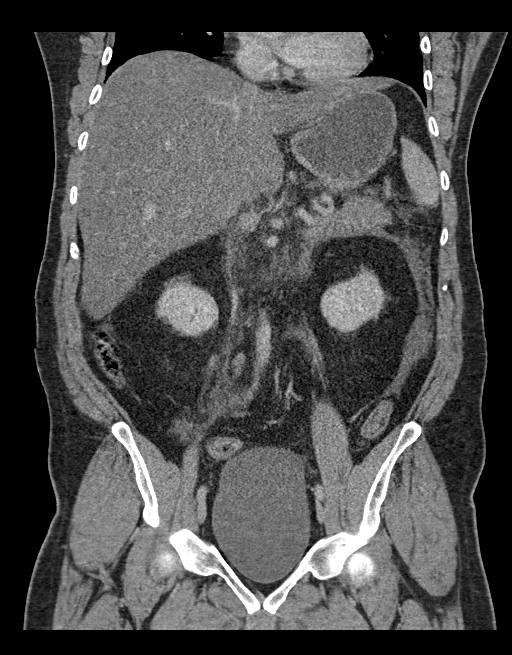

[15 of 46 positions shown; findings below may reference images not displayed]

FINDINGS: Lower chest: Lower lung volumes. Otherwise negative. No pericardial
or pleural effusion. There is mild fluid distension of the distal
thoracic esophagus.

Hepatobiliary: Severe hepatic steatosis. Negative gallbladder. No
bile duct enlargement.

Pancreas: Confluent peripancreatic inflammation, most pronounced at
the head and uncinate. No pancreatic ductal dilatation. Pancreatic
enhancement remains within normal limits, no necrosis identified.
Inflammation in the lesser sac, root of the small bowel mesentery,
and greater omentum with small volume mostly layering free fluid in
the upper abdomen. No rim enhancing or organized fluid collection.

Spleen: Normal enhancement. Adjacent inflammation and small volume
free fluid. No definite perisplenic varices.

Adrenals/Urinary Tract: Negative adrenal glands, kidneys and
ureters.

Dilated urinary bladder. Volume estimated at 780 mL. No perivesical
stranding. Incidental pelvic phleboliths.

Stomach/Bowel: Large bowel is decompressed from the hepatic flexure
distally. Small volume of low-density free fluid layering in the
gutters, more so the left. No large bowel inflammation identified.
Flocculated material in the terminal ileum which otherwise appears
negative. Appendix not delineated. No dilated small bowel. Trace
oral contrast in the distal stomach and duodenum. Secondary
inflammation of the duodenum and ligament of Treitz.

Free fluid layering in the lower abdomen and at the pelvic inlet. No
free air.

Vascular/Lymphatic: Major arterial structures in the abdomen and
pelvis are patent with minimal atherosclerosis. Portal vein is
patent but diminutive, and the splenic vein is indistinct at the
level of the pancreatic tail. The splenic vein in the midline
remains patent. SMV is patent. Systemic central venous structures in
the abdomen and pelvis also appear patent.

No lymphadenopathy.

Reproductive: Negative.

Other: No free fluid in the deep pelvis.

Musculoskeletal: No acute osseous abnormality identified.
IMPRESSION: 1. Acute Pancreatitis.
Upper abdominal inflammation with small volume free fluid in the
abdomen, pelvic inlet.
No pancreatic necrosis. No organized or drainable fluid collection
at this time.
Attenuated splenic vein, but no definite vascular thrombosis at this
time.
2. Dilated urinary bladder, estimated at 780 mL.
3. Severe hepatic steatosis.

## 2020-04-04 MED ORDER — THIAMINE HCL 100 MG/ML IJ SOLN: 100.0000 mg | Freq: Every day | INTRAMUSCULAR | Status: DC

## 2020-04-04 MED ORDER — THIAMINE HCL 100 MG PO TABS: 100.0000 mg | ORAL_TABLET | Freq: Every day | ORAL | Status: DC

## 2020-04-04 MED ORDER — LORAZEPAM 2 MG/ML IJ SOLN: 0.0000 mg | Freq: Two times a day (BID) | INTRAMUSCULAR | Status: DC

## 2020-04-04 MED ORDER — LACTATED RINGERS IV SOLN: INTRAVENOUS | Status: DC

## 2020-04-04 MED ORDER — IOHEXOL 300 MG/ML  SOLN
100.0000 mL | Freq: Once | INTRAMUSCULAR | Status: AC | PRN
  Administered 2020-04-04: 100 mL via INTRAVENOUS

## 2020-04-04 MED ORDER — ADULT MULTIVITAMIN W/MINERALS CH
1.0000 | ORAL_TABLET | Freq: Every day | ORAL | Status: DC
  Administered 2020-04-06 – 2020-04-15 (×10): 1 via ORAL
  Filled 2020-04-04 (×10): qty 1

## 2020-04-04 MED ORDER — INSULIN ASPART 100 UNIT/ML ~~LOC~~ SOLN
1.0000 [IU] | SUBCUTANEOUS | Status: DC | PRN
  Filled 2020-04-04: qty 0.04

## 2020-04-04 MED ORDER — CLONIDINE HCL 0.2 MG/24HR TD PTWK
0.2000 mg | MEDICATED_PATCH | TRANSDERMAL | Status: DC
  Administered 2020-04-04: 0.2 mg via TRANSDERMAL
  Filled 2020-04-04: qty 1

## 2020-04-04 MED ORDER — LORAZEPAM 2 MG/ML IJ SOLN
2.0000 mg | Freq: Once | INTRAMUSCULAR | Status: AC
  Administered 2020-04-04: 2 mg via INTRAVENOUS
  Filled 2020-04-04: qty 1

## 2020-04-04 MED ORDER — ASPIRIN EC 81 MG PO TBEC: 81.0000 mg | DELAYED_RELEASE_TABLET | Freq: Two times a day (BID) | ORAL | Status: DC

## 2020-04-04 MED ORDER — CLONIDINE HCL 0.1 MG PO TABS
0.2000 mg | ORAL_TABLET | Freq: Once | ORAL | Status: AC
  Administered 2020-04-04: 0.2 mg via ORAL
  Filled 2020-04-04: qty 2

## 2020-04-04 MED ORDER — LORAZEPAM 2 MG/ML IJ SOLN
0.0000 mg | Freq: Three times a day (TID) | INTRAMUSCULAR | Status: AC
  Administered 2020-04-06: 2 mg via INTRAVENOUS
  Administered 2020-04-07: 1 mg via INTRAVENOUS
  Administered 2020-04-07: 2 mg via INTRAVENOUS
  Administered 2020-04-08: 1 mg via INTRAVENOUS
  Filled 2020-04-04 (×4): qty 1

## 2020-04-04 MED ORDER — LORAZEPAM 2 MG/ML IJ SOLN
0.0000 mg | INTRAMUSCULAR | Status: AC
  Administered 2020-04-04 – 2020-04-05 (×2): 2 mg via INTRAVENOUS
  Administered 2020-04-05: 4 mg via INTRAVENOUS
  Administered 2020-04-05 (×4): 2 mg via INTRAVENOUS
  Administered 2020-04-05: 4 mg via INTRAVENOUS
  Administered 2020-04-06 (×3): 2 mg via INTRAVENOUS
  Filled 2020-04-04: qty 1
  Filled 2020-04-04: qty 2
  Filled 2020-04-04 (×8): qty 1

## 2020-04-04 MED ORDER — FOLIC ACID 1 MG PO TABS
1.0000 mg | ORAL_TABLET | Freq: Every day | ORAL | Status: DC
  Administered 2020-04-06 – 2020-04-15 (×10): 1 mg via ORAL
  Filled 2020-04-04 (×10): qty 1

## 2020-04-04 MED ORDER — LORAZEPAM 2 MG/ML IJ SOLN
1.0000 mg | INTRAMUSCULAR | Status: AC | PRN
  Administered 2020-04-04 – 2020-04-06 (×2): 2 mg via INTRAVENOUS
  Filled 2020-04-04: qty 2
  Filled 2020-04-04 (×2): qty 1

## 2020-04-04 MED ORDER — METOPROLOL TARTRATE 5 MG/5ML IV SOLN: 2.5000 mg | INTRAVENOUS | Status: DC | PRN

## 2020-04-04 MED ORDER — HYDROMORPHONE HCL 1 MG/ML IJ SOLN
1.0000 mg | INTRAMUSCULAR | Status: DC | PRN
  Administered 2020-04-04 (×3): 1 mg via INTRAVENOUS
  Filled 2020-04-04 (×3): qty 1

## 2020-04-04 MED ORDER — INFLUENZA VAC SPLIT QUAD 0.5 ML IM SUSY
0.5000 mL | PREFILLED_SYRINGE | INTRAMUSCULAR | Status: AC
  Administered 2020-04-07: 0.5 mL via INTRAMUSCULAR

## 2020-04-04 MED ORDER — MAGNESIUM SULFATE 4 GM/100ML IV SOLN
4.0000 g | Freq: Once | INTRAVENOUS | Status: AC
  Administered 2020-04-04: 4 g via INTRAVENOUS
  Filled 2020-04-04: qty 100

## 2020-04-04 MED ORDER — INSULIN ASPART 100 UNIT/ML ~~LOC~~ SOLN
5.0000 [IU] | Freq: Once | SUBCUTANEOUS | Status: AC
  Administered 2020-04-04: 5 [IU] via SUBCUTANEOUS
  Filled 2020-04-04: qty 0.05

## 2020-04-04 MED ORDER — AMLODIPINE BESYLATE 10 MG PO TABS
10.0000 mg | ORAL_TABLET | Freq: Every day | ORAL | Status: DC
  Administered 2020-04-06 – 2020-04-15 (×10): 10 mg via ORAL
  Filled 2020-04-04: qty 1
  Filled 2020-04-04 (×2): qty 2
  Filled 2020-04-04: qty 1
  Filled 2020-04-04: qty 2
  Filled 2020-04-04 (×2): qty 1
  Filled 2020-04-04: qty 2
  Filled 2020-04-04 (×2): qty 1

## 2020-04-04 MED ORDER — ORAL CARE MOUTH RINSE
15.0000 mL | Freq: Two times a day (BID) | OROMUCOSAL | Status: DC
  Administered 2020-04-05 – 2020-04-12 (×11): 15 mL via OROMUCOSAL

## 2020-04-04 MED ORDER — ACETAMINOPHEN 650 MG RE SUPP: 650.0000 mg | Freq: Four times a day (QID) | RECTAL | Status: DC | PRN

## 2020-04-04 MED ORDER — LORAZEPAM 2 MG/ML IJ SOLN
0.0000 mg | Freq: Four times a day (QID) | INTRAMUSCULAR | Status: DC
  Administered 2020-04-04: 4 mg via INTRAVENOUS
  Filled 2020-04-04: qty 2

## 2020-04-04 MED ORDER — HYDROMORPHONE HCL 1 MG/ML IJ SOLN
1.0000 mg | Freq: Once | INTRAMUSCULAR | Status: AC
  Administered 2020-04-04: 1 mg via INTRAVENOUS
  Filled 2020-04-04: qty 1

## 2020-04-04 MED ORDER — PANTOPRAZOLE SODIUM 40 MG PO TBEC
40.0000 mg | DELAYED_RELEASE_TABLET | Freq: Two times a day (BID) | ORAL | Status: DC
  Administered 2020-04-04 – 2020-04-15 (×21): 40 mg via ORAL
  Filled 2020-04-04 (×21): qty 1

## 2020-04-04 MED ORDER — SODIUM CHLORIDE 0.9% FLUSH
3.0000 mL | Freq: Two times a day (BID) | INTRAVENOUS | Status: DC
  Administered 2020-04-04 – 2020-04-15 (×17): 3 mL via INTRAVENOUS

## 2020-04-04 MED ORDER — ACETAMINOPHEN 325 MG PO TABS: 650.0000 mg | ORAL_TABLET | Freq: Four times a day (QID) | ORAL | Status: DC | PRN

## 2020-04-04 MED ORDER — CHLORHEXIDINE GLUCONATE CLOTH 2 % EX PADS
6.0000 | MEDICATED_PAD | Freq: Every day | CUTANEOUS | Status: DC
  Administered 2020-04-04 – 2020-04-14 (×11): 6 via TOPICAL

## 2020-04-04 MED ORDER — SODIUM CHLORIDE 0.9 % IV SOLN: INTRAVENOUS | Status: AC

## 2020-04-04 MED ORDER — METOPROLOL TARTRATE 5 MG/5ML IV SOLN
5.0000 mg | Freq: Four times a day (QID) | INTRAVENOUS | Status: DC
  Administered 2020-04-04 – 2020-04-07 (×11): 5 mg via INTRAVENOUS
  Filled 2020-04-04 (×12): qty 5

## 2020-04-04 MED ORDER — MORPHINE SULFATE (PF) 4 MG/ML IV SOLN
4.0000 mg | Freq: Once | INTRAVENOUS | Status: AC
  Administered 2020-04-04: 4 mg via INTRAVENOUS
  Filled 2020-04-04: qty 1

## 2020-04-04 MED ORDER — THIAMINE HCL 100 MG/ML IJ SOLN
100.0000 mg | Freq: Every day | INTRAMUSCULAR | Status: DC
  Administered 2020-04-05: 100 mg via INTRAVENOUS
  Filled 2020-04-04 (×2): qty 2

## 2020-04-04 MED ORDER — CHLORDIAZEPOXIDE HCL 25 MG PO CAPS
100.0000 mg | ORAL_CAPSULE | Freq: Once | ORAL | Status: AC
  Administered 2020-04-04: 100 mg via ORAL
  Filled 2020-04-04: qty 4

## 2020-04-04 MED ORDER — METOPROLOL TARTRATE 25 MG PO TABS
25.0000 mg | ORAL_TABLET | Freq: Two times a day (BID) | ORAL | Status: DC
  Administered 2020-04-04: 25 mg via ORAL
  Filled 2020-04-04: qty 1

## 2020-04-04 MED ORDER — LACTATED RINGERS IV BOLUS
1000.0000 mL | Freq: Once | INTRAVENOUS | Status: AC
  Administered 2020-04-04: 1000 mL via INTRAVENOUS

## 2020-04-04 MED ORDER — HYDRALAZINE HCL 20 MG/ML IJ SOLN: 20.0000 mg | Freq: Three times a day (TID) | INTRAMUSCULAR | Status: DC | PRN

## 2020-04-04 MED ORDER — LORAZEPAM 1 MG PO TABS
0.0000 mg | ORAL_TABLET | Freq: Four times a day (QID) | ORAL | Status: DC
  Administered 2020-04-04: 1 mg via ORAL
  Filled 2020-04-04: qty 1

## 2020-04-04 MED ORDER — LACTATED RINGERS IV BOLUS: 2000.0000 mL | Freq: Once | INTRAVENOUS | Status: DC

## 2020-04-04 MED ORDER — LABETALOL HCL 5 MG/ML IV SOLN
10.0000 mg | INTRAVENOUS | Status: DC | PRN
  Administered 2020-04-04 (×2): 10 mg via INTRAVENOUS
  Filled 2020-04-04 (×2): qty 4

## 2020-04-04 MED ORDER — HYDRALAZINE HCL 25 MG PO TABS
50.0000 mg | ORAL_TABLET | Freq: Three times a day (TID) | ORAL | Status: DC
  Administered 2020-04-04: 50 mg via ORAL
  Filled 2020-04-04: qty 2

## 2020-04-04 MED ORDER — HYDROMORPHONE HCL 1 MG/ML IJ SOLN
0.5000 mg | INTRAMUSCULAR | Status: DC | PRN
  Administered 2020-04-05 – 2020-04-07 (×2): 0.5 mg via INTRAVENOUS
  Filled 2020-04-04 (×2): qty 1

## 2020-04-04 MED ORDER — ONDANSETRON HCL 4 MG/2ML IJ SOLN: 4.0000 mg | Freq: Four times a day (QID) | INTRAMUSCULAR | Status: DC | PRN

## 2020-04-04 MED ORDER — CHLORHEXIDINE GLUCONATE 0.12 % MT SOLN
15.0000 mL | Freq: Two times a day (BID) | OROMUCOSAL | Status: DC
  Administered 2020-04-05 – 2020-04-15 (×17): 15 mL via OROMUCOSAL
  Filled 2020-04-04 (×17): qty 15

## 2020-04-04 MED ORDER — LORAZEPAM 1 MG PO TABS: 0.0000 mg | ORAL_TABLET | Freq: Two times a day (BID) | ORAL | Status: DC

## 2020-04-04 MED ORDER — SODIUM CHLORIDE 0.9 % IV BOLUS
2000.0000 mL | Freq: Once | INTRAVENOUS | Status: AC
  Administered 2020-04-04: 2000 mL via INTRAVENOUS

## 2020-04-04 MED ORDER — THIAMINE HCL 100 MG PO TABS
100.0000 mg | ORAL_TABLET | Freq: Every day | ORAL | Status: DC
  Administered 2020-04-04 – 2020-04-15 (×11): 100 mg via ORAL
  Filled 2020-04-04 (×11): qty 1

## 2020-04-04 MED ORDER — CLONIDINE HCL 0.1 MG PO TABS
0.2000 mg | ORAL_TABLET | Freq: Three times a day (TID) | ORAL | Status: DC
  Administered 2020-04-04: 0.2 mg via ORAL
  Filled 2020-04-04: qty 2

## 2020-04-04 MED ORDER — ONDANSETRON HCL 4 MG PO TABS: 4.0000 mg | ORAL_TABLET | Freq: Four times a day (QID) | ORAL | Status: DC | PRN

## 2020-04-04 MED ORDER — INSULIN ASPART 100 UNIT/ML ~~LOC~~ SOLN
0.0000 [IU] | Freq: Three times a day (TID) | SUBCUTANEOUS | Status: DC
  Filled 2020-04-04: qty 0.15

## 2020-04-04 MED ORDER — HYDROMORPHONE HCL 1 MG/ML IJ SOLN
1.0000 mg | Freq: Once | INTRAMUSCULAR | Status: AC | PRN
Start: 2020-04-04 — End: 2020-04-04
  Administered 2020-04-04: 1 mg via INTRAVENOUS
  Filled 2020-04-04: qty 1

## 2020-04-04 MED ORDER — ENOXAPARIN SODIUM 40 MG/0.4ML ~~LOC~~ SOLN
40.0000 mg | SUBCUTANEOUS | Status: DC
  Administered 2020-04-04 – 2020-04-10 (×7): 40 mg via SUBCUTANEOUS
  Filled 2020-04-04 (×7): qty 0.4

## 2020-04-04 MED ORDER — HYDROCODONE-ACETAMINOPHEN 5-325 MG PO TABS
1.0000 | ORAL_TABLET | ORAL | Status: DC | PRN
  Administered 2020-04-07 (×2): 2 via ORAL
  Administered 2020-04-08: 1 via ORAL
  Filled 2020-04-04: qty 2
  Filled 2020-04-04: qty 1
  Filled 2020-04-04: qty 2

## 2020-04-04 MED ORDER — LORAZEPAM 1 MG PO TABS: 1.0000 mg | ORAL_TABLET | ORAL | Status: AC | PRN

## 2020-04-04 NOTE — ED Triage Notes (Signed)
Pt came in via POV with c/o alcohol withdrawal and abdominal pain. Pt had his last drink 7 hours ago. Pt drinks a fifth of whisky a day. Pt HR 144

## 2020-04-04 NOTE — Consult Note (Signed)
NAME:  Peter [name], MRN:  [id number], DOB:  06/11/1980, LOS: 0 ADMISSION DATE:  04/04/2020, CONSULTATION DATE:  2/13 REFERRING MD:  EDP, CHIEF COMPLAINT:  abd pain/ hbp   Brief History:  39 yowm with HBP on clonidine/BB and smoker/ heavy etoh (1 bottle whiskey a day x weeks PTA) with last drink pm 2/12 admitted with abd pain ? DT's with EDP requesting admit to ICU for precedex so consulted PCCM am 2/13   History of Present Illness:  40 y.o. male with a hx of alcohol abuse, hypertension, pancreatitis, depression, and anxiety who presents to the ED with complaints of abdominal pain onset 3-4 days PTA. Patient states pain is located primarily in the epigastrium, it is constant, severe, no alleviating/aggravating factors. Has had associated nausea without vomiting. Feels like his prior pancreatitis. He is also having alcohol withdrawal, drinks a fifth of liquor daily, last drink was @ 1900 2/12  having anxiety, shakiness, and abdominal pain in this regard. He would like to stop drinking. He has hx of withdrawal, no hx of prior withdrawal seizures, states he maybe had mild DTs before.  He denies hematemesis, vomiting, diarrhea, melena, hematochezia, syncope, dysuria, seizure, hallucinations, or dyspnea.  Past Medical History:  Alcoholism Hypertension on clonidine pre admit Substance abuse   Significant Hospital Events:    Consults:  PCCM  Procedures:    Significant Diagnostic Tests:  Abd CT 2/13 1. Acute Pancreatitis. Upper abdominal inflammation with small volume free fluid in the abdomen, pelvic inlet. No pancreatic necrosis. No organized or drainable fluid collection Attenuated splenic vein, but no definite vascular thrombosis at this time. 2. Dilated urinary bladder, estimated at 780 mL. 3. Severe hepatic steatosis.  Micro Data:  Resp viral panel  2/13 >>>  Antimicrobials:    Scheduled Meds: . cloNIDine  0.2 mg Oral Once  . LORazepam  0-4 mg Intravenous Q6H   Or  .  LORazepam  0-4 mg Oral Q6H  . [START ON 04/06/2020] LORazepam  0-4 mg Intravenous Q12H   Or  . [START ON 04/06/2020] LORazepam  0-4 mg Oral Q12H  . thiamine  100 mg Oral Daily   Or  . thiamine  100 mg Intravenous Daily   Continuous Infusions: PRN Meds:.   Interim History / Subjective:  Feeling better since admit, still abd pain no nausea  Objective   Blood pressure (!) 168/106, pulse (!) 138, temperature 98 F (36.7 C), temperature source Oral, resp. rate 20, height 5\' 10"  (1.778 m), weight 103.4 kg, SpO2 94 %.        Intake/Output Summary (Last 24 hours) at 04/04/2020 04/06/2020 Last data filed at 04/04/2020 0636 Gross per 24 hour  Intake 3000 ml  Output --  Net 3000 ml   Filed Weights   04/04/20 0004  Weight: 103.4 kg    Examination: Tmax 98  And sats 96% RA General: alert approp nad  HENT: clear Lungs: clear bilaterall Cardiovascular: RRR ST at 140 Abdomen: markedly distended, gen tender, min rebuound Extremities: warm s clubbing/ edema Neuro: alert/ approp no def     Resolved Hospital Problem list      Assessment & Plan:  1)  Severe pancreatitis/sepsis  with lactic acidosis s necrosis or abscess rx with fluids/ bowel rest and GI eval prn if not clinically improving with conservative rx   2) HBP with probable clonidine rebound (made worse by stopping BB also) , not DT's rx clonidine/ prn lopressor and observe next 6 hours but don't think he'll need  precedex   3) Etohism with concern for w/d - last drink was 1900 2/12 so too early for typical dt's and should be able to manage this aspect of his problem with prn benzo's per protocol   4) AG acidosis with lactate > betahydroxy but probably has elements of both lactic acidosis and ketoacidosis both likely directly related to pancreatitis  >>> rx with fluids s dextrose/ SSI   Best practice (evaluated daily)  Diet: npo x chips/sips Pain/Anxiety/Delirium protocol (if indicated): benzo's per protocol VAP protocol (if  indicated): n/a DVT prophylaxis: pas GI prophylaxis: ppi Glucose control: ssi Mobility: bed rest Disposition: step down is probably fine  Code status: full code      Labs   CBC: Recent Labs  Lab 04/04/20 0048  WBC 24.8*  NEUTROABS 22.5*  HGB 17.1*  HCT 49.3  MCV 93.4  PLT 204    Basic Metabolic Panel: Recent Labs  Lab 04/04/20 0048  NA 133*  K 3.7  CL 95*  CO2 15*  GLUCOSE 277*  BUN 9  CREATININE 0.94  CALCIUM 8.8*   GFR: Estimated Creatinine Clearance: 127.1 mL/min (by C-G formula based on SCr of 0.94 mg/dL). Recent Labs  Lab 04/04/20 0048 04/04/20 0313 04/04/20 0525  WBC 24.8*  --   --   LATICACIDVEN  --  10.0* 9.5*    Liver Function Tests: Recent Labs  Lab 04/04/20 0048  AST 59*  ALT 73*  ALKPHOS 67  BILITOT 0.8  PROT 8.0  ALBUMIN 4.2   Recent Labs  Lab 04/04/20 0048  LIPASE 1,076*   No results for input(s): AMMONIA in the last 168 hours.  ABG No results found for: PHART, PCO2ART, PO2ART, HCO3, TCO2, ACIDBASEDEF, O2SAT   Coagulation Profile: No results for input(s): INR, PROTIME in the last 168 hours.  Cardiac Enzymes: No results for input(s): CKTOTAL, CKMB, CKMBINDEX, TROPONINI in the last 168 hours.  HbA1C: Hemoglobin A1C  Date/Time Value Ref Range Status  01/28/2019 03:21 PM 5.7 (A) 4.0 - 5.6 % Final    CBG: No results for input(s): GLUCAP in the last 168 hours.      Past Medical History:  He,  has a past medical history of Alcoholism (HCC), Hypertension, and Substance abuse (HCC).   Surgical History:   Past Surgical History:  Procedure Laterality Date  . DENTAL SURGERY    . TIBIA IM NAIL INSERTION Right 01/22/2018   Procedure: RIGHT INTRAMEDULLARY (IM) NAIL TIBIAL;  Surgeon: Sheral Apley, MD;  Location: [facility] OR;  Service: Orthopedics;  Laterality: Right;     Social History:   reports that he has been smoking cigarettes. He has been smoking about 0.50 packs per day. He has never used smokeless tobacco. He  reports current alcohol use. He reports that he does not use drugs.   Family History:  His family history includes CAD in his father; Cancer in his paternal grandfather; Hyperlipidemia in his paternal grandfather; Hypertension in his father and paternal grandfather; Stroke in his father.   Allergies No Known Allergies   Home Medications  Prior to Admission medications   Medication Sig Start Date End Date Taking? Authorizing Provider  amLODipine (NORVASC) 5 MG tablet Take 1 tablet (5 mg total) by mouth daily. 01/28/19   Kallie Locks, FNP  aspirin EC 81 MG tablet Take 1 tablet (81 mg total) by mouth 2 (two) times daily. For DVT prophylaxis for 30 days after surgery. 01/22/18   Albina Billet III, PA-C  busPIRone (BUSPAR) 7.5  MG tablet Take 1 tablet (7.5 mg total) by mouth 3 (three) times daily. Patient not taking: Reported on 04/04/2020 11/04/19   Kallie Locks, FNP  cloNIDine (CATAPRES) 0.1 MG tablet Take 1 tablet (0.1 mg total) by mouth 2 (two) times daily. 11/04/19   Kallie Locks, FNP  folic acid (FOLVITE) 1 MG tablet Take 1 tablet (1 mg total) by mouth daily. 05/21/17   Rolly Salter, MD  gabapentin (NEURONTIN) 300 MG capsule Take 1 capsule (300 mg total) by mouth 3 (three) times daily for 14 days. For 2 weeks post op for pain. Patient not taking: Reported on 04/04/2020 01/22/18 02/05/18  Albina Billet III, PA-C  hydrALAZINE (APRESOLINE) 50 MG tablet Take 50 mg by mouth every 8 (eight) hours. 02/28/20   [provider]  losartan (COZAAR) 100 MG tablet Take 1 tablet (100 mg total) by mouth daily. Patient not taking: No sig reported 11/04/19   Kallie Locks, FNP  magnesium oxide (MAG-OX) 400 MG tablet Take 1 tablet by mouth 2 (two) times daily. 02/28/20   [provider]  metoprolol tartrate (LOPRESSOR) 25 MG tablet Take 1 tablet (25 mg total) by mouth 2 (two) times daily. Patient to keep follow up appointment to receive additional refills. 11/04/19    Kallie Locks, FNP  multivitamin (ONE-A-DAY MEN'S) TABS tablet Take 1 tablet by mouth daily.    [provider]  pantoprazole (PROTONIX) 40 MG tablet Take 40 mg by mouth daily. 02/28/20   [provider]  thiamine 100 MG tablet Take 100 mg by mouth daily. 02/28/20   [provider]  venlafaxine XR (EFFEXOR-XR) 75 MG 24 hr capsule Take 1 capsule (75 mg total) by mouth daily with breakfast. Patient not taking: Reported on 04/04/2020 11/04/19 12/04/19  Kallie Locks, FNP        The patient is critically ill with multiple organ systems failure and requires high complexity decision making for assessment and support, frequent evaluation and titration of therapies, application of advanced monitoring technologies and extensive interpretation of multiple databases. Critical Care Time devoted to patient care services described in this note is 45 minutes.    Sandrea Hughs, MD Pulmonary and Critical Care Medicine [facility] Cell [phone]   After 7:00 pm call Elink  [phone]

## 2020-04-04 NOTE — Progress Notes (Signed)
OVERNIGHT COVERAGE CRITICAL CARE PROGRESS NOTE  CTSP re: need to change PO to IV meds and request for Precedex infusion.  Currently, on CIWA protocol and getting Ativan/Librium.  Resting comfortably.  Change metoprolol to 5 mg IV q hr scheduled with hold parameters. Change hydralazine 50 mg PO q 8 hr to 25 mg IV q 8 hr PRN. Change clonidine 0.2 mg PO TID to Catapres TTS-2. Decrease Dilaudid to 0.5 mg IV q 2 hr PRN.  Marcelle Smiling, MD Board Certified by the [employer]IM, Pulmonary Diseases & Critical Care Medicine

## 2020-04-04 NOTE — Progress Notes (Signed)
CRITICAL VALUE ALERT  Critical Value:  Lactic acid 5.6  Date & Time Notied:  04/04/2020 @ 1910  Provider Notified: Notified patient's nurse Lysle Pearl, RN-- Lab value trending down lower than previous critical value   Orders Received/Actions taken: Continue with current treatment

## 2020-04-04 NOTE — ED Provider Notes (Addendum)
[facility]-EMERGENCY DEPT Provider Note   CSN: [id number] Arrival date & time: 04/03/20  2347     History Chief Complaint  Patient presents with  . Abdominal Pain  . Withdrawal    Peter StairsBrandon Rennert is a 40 y.o. male with a hx of alcohol abuse, hypertension, pancreatitis, depression, and anxiety who presents to the ED with complaints of abdominal pain x 3-4 days. Patient states pain is located primarily in the epigastrium, it is constant, severe, no alleviating/aggravating factors. Has had associated nausea without vomiting. Feels like his prior pancreatitis. He is also having alcohol withdrawal, drinks a fifth of liquor daily, last drink was @ 1900, having anxiety, shakiness, and abdominal pain in this regard. He would like to stop drinking. He has hx of withdrawal, no hx of prior withdrawal seizures, states he maybe had mild DTs before.  He denies hematemesis, vomiting, diarrhea, melena, hematochezia, syncope, dysuria, seizure, hallucinations, or dyspnea.  HPI     Past Medical History:  Diagnosis Date  . Alcoholism (HCC)   . Hypertension   . Substance abuse [facility](HCC)     Patient Active Problem List   Diagnosis Date Noted  . Generalized anxiety disorder 01/29/2019  . Depression 01/29/2019  . Right tibial fracture 01/21/2018  . Chest pain 05/20/2017  . Hypomagnesemia 02/15/2017  . Abdominal pain 02/14/2017  . Acute pancreatitis 02/14/2017  . Alcoholic hepatitis without ascites 02/14/2017  . Hypokalemia 12/03/2016  . Leukocytosis 12/03/2016  . Alcohol withdrawal (HCC) 12/02/2016  . Alcohol dependence with other alcohol-induced disorder (HCC) 11/18/2016  . 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 Essential hypertension 11/18/2016  . Elevated liver enzymes 11/14/2016    Past Surgical History:  Procedure Laterality Date  . DENTAL SURGERY    . TIBIA IM NAIL INSERTION Right 01/22/2018   Procedure: RIGHT INTRAMEDULLARY (IM) NAIL TIBIAL;  Surgeon: Sheral ApleyMurphy, Timothy D, MD;   Location: [facility] OR;  Service: Orthopedics;  Laterality: Right;       Family History  Problem Relation Age of Onset  . Hypertension Father   . Stroke Father   . CAD Father   . Cancer Paternal Grandfather        Lung  . Hyperlipidemia Paternal Grandfather   . Hypertension Paternal Grandfather     Social History   Tobacco Use  . Smoking status: Current Every Day Smoker    Packs/day: 0.50    Types: Cigarettes  . Smokeless tobacco: Never Used  Vaping Use  . Vaping Use: Never used  Substance Use Topics  . Alcohol use: Yes    Comment:    . Drug use: No    Home Medications Prior to Admission medications   Medication Sig Start Date End Date Taking? Authorizing Provider  amLODipine (NORVASC) 5 MG tablet Take 1 tablet (5 mg total) by mouth daily. 01/28/19   Kallie LocksStroud, Natalie M, FNP  aspirin EC 81 MG tablet Take 1 tablet (81 mg total) by mouth 2 (two) times daily. For DVT prophylaxis for 30 days after surgery. 01/22/18   Martensen, Lucretia KernHenry Calvin III, PA-C  busPIRone (BUSPAR) 7.5 MG tablet Take 1 tablet (7.5 mg total) by mouth 3 (three) times daily. 11/04/19   Kallie LocksStroud, Natalie M, FNP  cloNIDine (CATAPRES) 0.1 MG tablet Take 1 tablet (0.1 mg total) by mouth 2 (two) times daily. 11/04/19   Kallie LocksStroud, Natalie M, FNP  folic acid (FOLVITE) 1 MG tablet Take 1 tablet (1 mg total) by mouth daily. 05/21/17   Rolly SalterPatel, Pranav M, MD  gabapentin (NEURONTIN) 300  MG capsule Take 1 capsule (300 mg total) by mouth 3 (three) times daily for 14 days. For 2 weeks post op for pain. 01/22/18 02/05/18  Albina Billet III, PA-C  losartan (COZAAR) 100 MG tablet Take 1 tablet (100 mg total) by mouth daily. 11/04/19   Kallie Locks, FNP  metoprolol tartrate (LOPRESSOR) 25 MG tablet Take 1 tablet (25 mg total) by mouth 2 (two) times daily. Patient to keep follow up appointment to receive additional refills. 11/04/19   Kallie Locks, FNP  multivitamin (ONE-A-DAY MEN'S) TABS tablet Take 1 tablet by mouth daily.     [provider]  venlafaxine XR (EFFEXOR-XR) 75 MG 24 hr capsule Take 1 capsule (75 mg total) by mouth daily with breakfast. 11/04/19 12/04/19  Kallie Locks, FNP    Allergies    Patient has no known allergies.  Review of Systems   Review of Systems  Constitutional: Negative for fever.  Respiratory: Negative for shortness of breath.   Gastrointestinal: Positive for abdominal pain and nausea. Negative for anal bleeding, blood in stool, constipation, diarrhea and vomiting.  Genitourinary: Negative for dysuria.  Neurological: Positive for tremors ("shaky"). Negative for seizures and syncope.  Psychiatric/Behavioral: Negative for hallucinations. The patient is nervous/anxious.   All other systems reviewed and are negative.   Physical Exam Updated Vital Signs BP (!) 139/97 (BP Location: Left Arm)   Pulse (!) 144   Temp 97.6 F (36.4 C) (Oral)   Resp (!) 28   Ht 5\' 10"  (1.778 m)   Wt 103.4 kg   SpO2 96%   BMI 32.71 kg/m   Physical Exam Vitals and nursing note reviewed.  Constitutional:      General: He is not in acute distress.    Appearance: He is well-developed. He is not toxic-appearing.  HENT:     Head: Normocephalic and atraumatic.  Eyes:     General:        Right eye: No discharge.        Left eye: No discharge.     Conjunctiva/sclera: Conjunctivae normal.  Cardiovascular:     Rate and Rhythm: Regular rhythm. Tachycardia present.  Pulmonary:     Effort: Pulmonary effort is normal. No respiratory distress.     Breath sounds: Normal breath sounds. No wheezing, rhonchi or rales.  Abdominal:     General: There is no distension.     Palpations: Abdomen is soft.     Tenderness: There is abdominal tenderness in the right upper quadrant, epigastric area and left upper quadrant. There is no guarding or rebound.  Musculoskeletal:     Cervical back: Neck supple.  Skin:    General: Skin is warm.     Findings: No rash.  Neurological:     Mental Status: He is  alert.     Comments: Clear speech. Mildly tremulous.   Psychiatric:        Behavior: Behavior normal.     ED Results / Procedures / Treatments   Labs (all labs ordered are listed, but only abnormal results are displayed) Labs Reviewed  CBC WITH DIFFERENTIAL/PLATELET - Abnormal; Notable for the following components:      Result Value   WBC 24.8 (*)    Hemoglobin 17.1 (*)    Neutro Abs 22.5 (*)    Monocytes Absolute 1.2 (*)    Abs Immature Granulocytes 0.17 (*)    All other components within normal limits  COMPREHENSIVE METABOLIC PANEL - Abnormal; Notable for the following components:  Sodium 133 (*)    Chloride 95 (*)    CO2 15 (*)    Glucose, Bld 277 (*)    Calcium 8.8 (*)    AST 59 (*)    ALT 73 (*)    Anion gap 23 (*)    All other components within normal limits  LIPASE, BLOOD - Abnormal; Notable for the following components:   Lipase 1,076 (*)    All other components within normal limits    EKG EKG Interpretation  Date/Time:  Sunday April 04 2020 00:25:37 EST Ventricular Rate:  143 PR Interval:    QRS Duration: 84 QT Interval:  281 QTC Calculation: 434 R Axis:   64 Text Interpretation: Sinus tachycardia Probable left atrial enlargement Nonspecific repol abnormality, inferior leads Baseline wander in lead(s) V1 12 Lead; Mason-Likar Since last tracing rate faster Otherwise no significant change Confirmed by Melene Plan [phone]) on 04/04/2020 12:29:39 AM   Radiology No results found.  Procedures .Critical Care Performed by: Cherly Anderson, PA-C Authorized by: Cherly Anderson, PA-C     CRITICAL CARE Performed by: Harvie Heck   Total critical care time: 45 minutes  Critical care time was exclusive of separately billable procedures and treating other patients.  Critical care was necessary to treat or prevent imminent or life-threatening deterioration.  Critical care was time spent personally by me on the following activities:  development of treatment plan with patient and/or surrogate as well as nursing, discussions with consultants, evaluation of patient's response to treatment, examination of patient, obtaining history from patient or surrogate, ordering and performing treatments and interventions, ordering and review of laboratory studies, ordering and review of radiographic studies, pulse oximetry and re-evaluation of patient's condition.   Medications Ordered in ED Medications - No data to display  ED Course  I have reviewed the triage vital signs and the nursing notes.  Pertinent labs & imaging results that were available during my care of the patient were reviewed by me and considered in my medical decision making (see chart for details).    MDM Rules/Calculators/A&P                          Patient presents to the ED with complaints of abdominal pain & epigastric pain.  Patient is notably tachycardic, also hypertensive. Upper abdominal tenderness noted.    Additional history obtained:  Additional history obtained from chart review & nursing note review.   Lab Tests:  I Ordered, reviewed, and interpreted labs, which included:  CBC: Leukocytosis @ almost 25,000. CMP: Elevated anion gap acidosis. Glucose elevated some @ 277 no hx of DM previously. Mild elevation in LFTs.  Lipase: Grossly elevated @ 1076.   CIWA protocol- will give ativan & librium initially and start 2L fluids. Plan for CT A/P.   Imaging Studies ordered:  I ordered imaging studies which included CT A/P, I independently reviewed, formal radiology impression shows:  1. Acute Pancreatitis. Upper abdominal inflammation with small volume free fluid in the abdomen, pelvic inlet. No pancreatic necrosis. No organized or drainable fluid collection at this time. Attenuated splenic vein, but no definite vascular thrombosis at this time. 2. Dilated urinary bladder, estimated at 780 mL.--> urinated shortly after this.  3. Severe hepatic  steatosis.  04:50: Lactic acid 10-  suspect this is the cause of elevated anion gap acidosis. Will give 2L LR. Pain medicine ordered as well. The patient is noted to have a lactate>4. With the current information  available to me, I don't think the patient is in septic shock. The lactate>4, is related to liver/alcohol disease & pancreatitis.   Repeat lactic acid 9.5- no significant improvement. Patient has received analgesics, additional ativan, and a 3rd L of fluid, he remains tachycardic @ 140. 4th L of fluid running, will order a 5th L (LR). Considering precedex with persistent tachycardia & hypertension. Discussed w/ attending, will discuss w/ critical care.   06:53: CONSULT: Discussed with intensivist Dr. Shiela Mayer have someone from the critical care team evaluate patient.  Appreciate consultation.  AM ED PA Langston Masker aware of pending critical care assessment.   This is a shared visit with supervising physician Dr. Adela Lank who has independently evaluated patient & provided guidance in evaluation/management/disposition, in agreement with care   Portions of this note were generated with Dragon dictation software. Dictation errors may occur despite best attempts at proofreading.  Final Clinical Impression(s) / ED Diagnoses Final diagnoses:  Acute pancreatitis, unspecified complication status, unspecified pancreatitis type  Alcohol withdrawal syndrome without complication (HCC)  High anion gap metabolic acidosis  Lactic acidosis    Rx / DC Orders ED Discharge Orders    None       Cherly Anderson, PA-C 04/04/20 0703    Cherly Anderson, PA-C 04/04/20 0704    Melene Plan, DO 04/04/20 [phone]

## 2020-04-04 NOTE — ED Notes (Signed)
Date and time results received: 04/04/20 0455 (use smartphrase ".now" to insert current time)  Test: Lactic Critical Value: 10  Name of Provider Notified: Sam  Orders Received? Or Actions Taken?: Orders Received - See Orders for details

## 2020-04-04 NOTE — H&P (Addendum)
History and Physical        Hospital Admission Note Date: 04/04/2020  Patient name: Peter [name] Medical record number: [id number] Date of birth: 1980-05-26 Age: 40 y.o. Gender: male  PCP: Azzie Glatter, FNP    Chief Complaint    Chief Complaint  Patient presents with  . Abdominal Pain  . Withdrawal      HPI:   This is a 40 year old male with past medical history of alcohol abuse, hypertension, pancreatitis, recent acute cholecystitis in January at [facility], depression and anxiety who presented to the ED with complaints of constant, severe epigastric abdominal pain x3 to 4 days.  Patient drinks 1/5 of whiskey daily and his last drink was yesterday at 1900.  Now with anxiety and shakiness in addition to the abdominal pain.  Feels like this is similar to prior withdrawal and pancreatitis episodes.  He wishes to quit drinking and so he decided to quit suddenly.  Denies auditory or visual hallucinations.  No history of withdrawal seizures but did have an episode of withdrawal hallucinations in the past.  No blood in stool or vomit.  No other complaints.  Of note, he was diagnosed with acute cholecystitis at the beginning of January, 2022 via MRCP at [facility]. Surgery recommended elective outpatient cholecystectomy two weeks from discharge however he did not go for follow up due to lack of insurance.    ED Course: Afebrile, tachycardic, tachypneic,  hypertensive, on room air. Notable Labs: Sodium 133, K3.7, chloride 95, CO2 15, glucose 277, BUN 9, creatinine 0.94, AG 23, alk phos 67, lipase 1076, AST 59, ALT 73, lactic acid 10.0, WBC 24.8, Hb 17.1, platelets 204, beta hydroxybutyrate 0.3, alcohol level undetectable, COVID-19 negative. Notable Imaging: CT abdomen pelvis with contrast-acute pancreatitis without necrosis, no bile duct enlargement, severe hepatic steatosis, dilated bladder.   He received Librium, clonidine, Ativan, morphine, Protonix, 3 L LR bolus and 2 L NS bolus.  PCCM was consulted by the ED who did not feel the patient required Precedex and recommended hospitalist admission.   Vitals:   04/04/20 1035 04/04/20 1245  BP: (!) 168/96 (!) 168/96  Pulse: (!) 149 (!) 144  Resp: (!) 40 (!) 31  Temp:    SpO2: 94% 91%     Review of Systems:  Review of Systems  All other systems reviewed and are negative.   Medical/Social/Family History   Past Medical History: Past Medical History:  Diagnosis Date  . Alcoholism ([place])   . DKA (diabetic ketoacidosis) ([place]) 04/04/2020  . Hypertension   . Substance abuse [facility])     Past Surgical History:  Procedure Laterality Date  . DENTAL SURGERY    . TIBIA IM NAIL INSERTION Right 01/22/2018   Procedure: RIGHT INTRAMEDULLARY (IM) NAIL TIBIAL;  Surgeon: Renette Butters, MD;  Location: [place];  Service: Orthopedics;  Laterality: Right;    Medications: Prior to Admission medications   Medication Sig Start Date End Date Taking? Authorizing Provider  amLODipine (NORVASC) 10 MG tablet Take 10 mg by mouth daily. 02/28/20  Yes [provider]  aspirin EC 81 MG tablet Take 1 tablet (81 mg total) by mouth 2 (two) times daily. For DVT prophylaxis for 30 days after surgery. 01/22/18  Yes Prudencio Burly III, PA-C  busPIRone (BUSPAR) 7.5 MG tablet Take 1 tablet (7.5 mg total) by mouth 3 (three) times daily. 11/04/19  Yes Azzie Glatter, FNP  cloNIDine (CATAPRES) 0.2 MG tablet Take 0.2 mg by mouth 3 (three) times daily. 02/28/20  Yes [provider]  DM-Phenylephrine-Acetaminophen (VICKS DAYQUIL COLD & FLU) 10-5-325 MG/15ML LIQD Take 15 mLs by mouth as needed (cold/congestion).   Yes [provider]  folic acid (FOLVITE) 1 MG tablet Take 1 tablet (1 mg total) by mouth daily. 05/21/17  Yes Lavina Hamman, MD  hydrALAZINE (APRESOLINE) 50 MG tablet Take 50 mg by mouth every 8 (eight) hours. 02/28/20  Yes  [provider]  magnesium oxide (MAG-OX) 400 MG tablet Take 1 tablet by mouth 2 (two) times daily. 02/28/20  Yes [provider]  metoprolol tartrate (LOPRESSOR) 25 MG tablet Take 1 tablet (25 mg total) by mouth 2 (two) times daily. Patient to keep follow up appointment to receive additional refills. 11/04/19  Yes Azzie Glatter, FNP  OVER THE COUNTER MEDICATION Take 6 tablets by mouth daily. Juice plus gummies   Yes [provider]  pantoprazole (PROTONIX) 40 MG tablet Take 40 mg by mouth daily. 02/28/20  Yes [provider]  thiamine 100 MG tablet Take 100 mg by mouth daily. 02/28/20  Yes [provider]  venlafaxine XR (EFFEXOR-XR) 75 MG 24 hr capsule Take 1 capsule (75 mg total) by mouth daily with breakfast. 11/04/19 12/04/19 Yes Azzie Glatter, FNP  amLODipine (NORVASC) 5 MG tablet Take 1 tablet (5 mg total) by mouth daily. Patient not taking: Reported on 04/04/2020 01/28/19   Azzie Glatter, FNP  cloNIDine (CATAPRES) 0.1 MG tablet Take 1 tablet (0.1 mg total) by mouth 2 (two) times daily. Patient not taking: Reported on 04/04/2020 11/04/19   Azzie Glatter, FNP  losartan (COZAAR) 100 MG tablet Take 1 tablet (100 mg total) by mouth daily. Patient not taking: No sig reported 11/04/19   Azzie Glatter, FNP    Allergies:  No Known Allergies  Social History:  reports that he has been smoking cigarettes. He has been smoking about 0.50 packs per day. He has never used smokeless tobacco. He reports current alcohol use. He reports that he does not use drugs.  Family History: Family History  Problem Relation Age of Onset  . Hypertension Father   . Stroke Father   . CAD Father   . Cancer Paternal Grandfather        Lung  . Hyperlipidemia Paternal Grandfather   . Hypertension Paternal Grandfather      Objective   Physical Exam: Blood pressure (!) 168/96, pulse (!) 144, temperature 98 F (36.7 C), temperature source Oral, resp. rate (!)  31, height 5' 10"  (1.778 m), weight 103.4 kg, SpO2 91 %.  Physical Exam Vitals and nursing note reviewed.  Constitutional:      Appearance: Normal appearance. He is ill-appearing and diaphoretic.  HENT:     Head: Normocephalic and atraumatic.  Eyes:     Conjunctiva/sclera: Conjunctivae normal.  Cardiovascular:     Rate and Rhythm: Regular rhythm. Tachycardia present.  Pulmonary:     Effort: Pulmonary effort is normal.     Breath sounds: Normal breath sounds.  Abdominal:     General: There is distension.     Tenderness: There is generalized abdominal tenderness and tenderness in the epigastric area.  Musculoskeletal:        General: No swelling or  tenderness.  Skin:    Coloration: Skin is not jaundiced or pale.  Neurological:     General: No focal deficit present.     Mental Status: He is alert. Mental status is at baseline.  Psychiatric:        Mood and Affect: Mood normal.        Behavior: Behavior normal.     LABS on Admission: I have personally reviewed all the labs and imaging below    Basic Metabolic Panel: Recent Labs  Lab 04/04/20 0048  NA 133*  K 3.7  CL 95*  CO2 15*  GLUCOSE 277*  BUN 9  CREATININE 0.94  CALCIUM 8.8*   Liver Function Tests: Recent Labs  Lab 04/04/20 0048  AST 59*  ALT 73*  ALKPHOS 67  BILITOT 0.8  PROT 8.0  ALBUMIN 4.2   Recent Labs  Lab 04/04/20 0048  LIPASE 1,076*   No results for input(s): AMMONIA in the last 168 hours. CBC: Recent Labs  Lab 04/04/20 0048  WBC 24.8*  NEUTROABS 22.5*  HGB 17.1*  HCT 49.3  MCV 93.4  PLT 204   Cardiac Enzymes: No results for input(s): CKTOTAL, CKMB, CKMBINDEX, TROPONINI in the last 168 hours. BNP: Invalid input(s): POCBNP CBG: No results for input(s): GLUCAP in the last 168 hours.  Radiological Exams on Admission:  CT Abdomen Pelvis W Contrast  Result Date: 04/04/2020 CLINICAL DATA:  40 year old male with abdominal pain. Alcohol withdrawal. EXAM: CT ABDOMEN AND PELVIS WITH  CONTRAST TECHNIQUE: Multidetector CT imaging of the abdomen and pelvis was performed using the standard protocol following bolus administration of intravenous contrast. CONTRAST:  188m OMNIPAQUE IOHEXOL 300 MG/ML  SOLN COMPARISON:  [facility] FINDINGS: Lower chest: Lower lung volumes. Otherwise negative. No pericardial or pleural effusion. There is mild fluid distension of the distal thoracic esophagus. Hepatobiliary: Severe hepatic steatosis. Negative gallbladder. No bile duct enlargement. Pancreas: Confluent peripancreatic inflammation, most pronounced at the head and uncinate. No pancreatic ductal dilatation. Pancreatic enhancement remains within normal limits, no necrosis identified. Inflammation in the lesser sac, root of the small bowel mesentery, and greater omentum with small volume mostly layering free fluid in the upper abdomen. No rim enhancing or organized fluid collection. Spleen: Normal enhancement. Adjacent inflammation and small volume free fluid. No definite perisplenic varices. Adrenals/Urinary Tract: Negative adrenal glands, kidneys and ureters. Dilated urinary bladder. Volume estimated at 780 mL. No perivesical stranding. Incidental pelvic phleboliths. Stomach/Bowel: Large bowel is decompressed from the hepatic flexure distally. Small volume of low-density free fluid layering in the gutters, more so the left. No large bowel inflammation identified. Flocculated material in the terminal ileum which otherwise appears negative. Appendix not delineated. No dilated small bowel. Trace oral contrast in the distal stomach and duodenum. Secondary inflammation of the duodenum and ligament of Treitz. Free fluid layering in the lower abdomen and at the pelvic inlet. No free air. Vascular/Lymphatic: Major arterial structures in the abdomen and pelvis are patent with minimal atherosclerosis.  Portal vein is patent but diminutive, and the splenic vein is indistinct at the level of the pancreatic tail. The splenic vein in the midline remains patent. SMV is patent. Systemic central venous structures in the abdomen and pelvis also appear patent. No lymphadenopathy. Reproductive: Negative. Other: No free fluid in the deep pelvis. Musculoskeletal: No acute osseous abnormality identified. IMPRESSION: 1. Acute Pancreatitis. Upper abdominal inflammation with small volume free fluid in  the abdomen, pelvic inlet. No pancreatic necrosis. No organized or drainable fluid collection at this time. Attenuated splenic vein, but no definite vascular thrombosis at this time. 2. Dilated urinary bladder, estimated at 780 mL. 3. Severe hepatic steatosis. Electronically Signed   By: Genevie Ann M.D.   On: 04/04/2020 04:55      EKG: sinus tachycardia   A & P   Active Problems:   Alcohol dependence with other alcohol-induced disorder (HCC)   Essential hypertension   Acute pancreatitis   Lactic acidosis   Pancreatitis, acute   1. SIRS secondary to pancreatitis, suspect alcohol induced a. More likely severe SIRS with organ dysfunction as opposed to sepsis as no suspected infection b. Tachycardia, tachypnea, lipase 1076, Lactic acid 10.0 c. Continue aggressive IV fluids d. N.p.o. except sips with meds and advance diet as tolerated e. Repeat lipase in a.m. f. Lipid panel (had elevated triglycerides in 2019) g. Dilaudid as needed fo severe r pain h. Trend lactic acid  2. Alcohol withdrawal a. No need for Precedex per PCCM b. At risk for tube developing DT but only about 15 hours since last drink c. Monitoring SDU d. CIWA protocol with standing and as needed Ativan  3. Hypertension a. Continue home clonidine, amlodipine and hydralazine b. Labetalol every 2 hours as needed c. Treat pain  4. Anion gap metabolic acidosis, likely secondary to alcoholic ketoacidosis a. Unlikely DKA but does have some  hyperglycemia as well with DM history b. Novolog 5 u x 1 and trend POC glucose c. Follow up Hb A1c d. Treat alcohol withdrawal  5. Recent history of Acute Cholecystitis a. MRCP [facility]/22/[facility] at [facility] consistent with acute cholecystitis - he has not gone for outpatient follow up or undergone elective cholecystectomy due to lack of insurance b. No evidence of biliary ductal dilation on CT  c. Consider general surgery consult while inpatient once through the acute illness period    DVT prophylaxis: Lovenox   Code Status: Full Code  Diet: N.p.o. except for sips with meds Family Communication: Admission, patients condition and plan of care including tests being ordered have been discussed with the patient who indicates understanding and agrees with the plan and Code Status.  Disposition Plan: The appropriate patient status for this patient is INPATIENT. Inpatient status is judged to be reasonable and necessary in order to provide the required intensity of service to ensure the patient's safety. The patient's presenting symptoms, physical exam findings, and initial radiographic and laboratory data in the context of their chronic comorbidities is felt to place them at high risk for further clinical deterioration. Furthermore, it is not anticipated that the patient will be medically stable for discharge from the hospital within 2 midnights of admission. The following factors support the patient status of inpatient.   " The patient's presenting symptoms include abdominal pain. " The worrisome physical exam findings include abdominal pain and distention. " The initial radiographic and laboratory data are worrisome because of elevated lipase, lactic acidosis. " The chronic co-morbidities include alcohol abuse, hypertension.   * I certify that at the point of admission it is my clinical judgment that the patient will require inpatient hospital care spanning beyond 2 midnights from the point of admission  due to high intensity of service, high risk for further deterioration and high frequency of surveillance required.*   Status is: Inpatient  Remains inpatient appropriate because:IV treatments appropriate due to intensity of illness or inability to take PO and Inpatient level of care  appropriate due to severity of illness   Dispo: The patient is from: Home              Anticipated d/c is to: Home              Anticipated d/c date is: 3 days              Patient currently is not medically stable to d/c.   Difficult to place patient No         The medical decision making on this patient was of high complexity and the patient is at high risk for clinical deterioration, therefore this is a level 3 admission.  Consultants  . PCCM  Procedures  . None  Time Spent on Admission: 70 minutes    Harold Hedge, DO [facility]  04/04/2020, 1:04 PM

## 2020-04-04 NOTE — ED Provider Notes (Signed)
Dr. Sherene Sires evaluated pt.  He advised pt can be admitted by hospitalist  Hospitalist consulted    Osie Cheeks 04/04/20 1053    Lorre Nick, MD 04/06/20 [phone]

## 2020-04-04 NOTE — Progress Notes (Signed)
Pt getting up to go to [facility] s assistance requesting pain meds for abd pain, received dilaudid and ativan previously and reported "they worked fine".   Still hypertensive/tachycardic  p clonidine 0.2 mg po 30 min prior to my recheck but sats fine on Ra and has not received prn lopressor yet.   Would rec admit to [facility], step down fine, no precedex or escalation of care anticipated over next 24-48 though he is certainly at risk.    Discussed with ER Staff.   Sandrea Hughs, MD Pulmonary and Critical Care Medicine [facility] Cell [phone]   After 7:00 pm call Elink  [phone]

## 2020-04-05 ENCOUNTER — Inpatient Hospital Stay (HOSPITAL_COMMUNITY): Payer: Self-pay

## 2020-04-05 DIAGNOSIS — K8500 Idiopathic acute pancreatitis without necrosis or infection: Secondary | ICD-10-CM

## 2020-04-05 LAB — BASIC METABOLIC PANEL
Anion gap: 10 (ref 5–15)
BUN: 24 mg/dL — ABNORMAL HIGH (ref 6–20)
CO2: 24 mmol/L (ref 22–32)
Calcium: 8 mg/dL — ABNORMAL LOW (ref 8.9–10.3)
Chloride: 97 mmol/L — ABNORMAL LOW (ref 98–111)
Creatinine, Ser: 1.43 mg/dL — ABNORMAL HIGH (ref 0.61–1.24)
GFR, Estimated: >60 mL/min (ref >60)
Glucose, Bld: 175 mg/dL — ABNORMAL HIGH (ref 70–99)
Potassium: 5.4 mmol/L — ABNORMAL HIGH (ref 3.5–5.1)
Sodium: 131 mmol/L — ABNORMAL LOW (ref 135–145)

## 2020-04-05 LAB — COMPREHENSIVE METABOLIC PANEL
ALT: 59 U/L — ABNORMAL HIGH (ref 0–44)
AST: 69 U/L — ABNORMAL HIGH (ref 15–41)
Albumin: 3.1 g/dL — ABNORMAL LOW (ref 3.5–5.0)
Alkaline Phosphatase: 48 U/L (ref 38–126)
Anion gap: 12 (ref 5–15)
BUN: 18 mg/dL (ref 6–20)
CO2: 21 mmol/L — ABNORMAL LOW (ref 22–32)
Calcium: 8.2 mg/dL — ABNORMAL LOW (ref 8.9–10.3)
Chloride: 97 mmol/L — ABNORMAL LOW (ref 98–111)
Creatinine, Ser: 0.96 mg/dL (ref 0.61–1.24)
GFR, Estimated: >60 mL/min (ref >60)
Glucose, Bld: 184 mg/dL — ABNORMAL HIGH (ref 70–99)
Potassium: 6.2 mmol/L — ABNORMAL HIGH (ref 3.5–5.1)
Sodium: 130 mmol/L — ABNORMAL LOW (ref 135–145)
Total Bilirubin: 3.3 mg/dL — ABNORMAL HIGH (ref 0.3–1.2)
Total Protein: 6.3 g/dL — ABNORMAL LOW (ref 6.5–8.1)

## 2020-04-05 LAB — URINALYSIS, ROUTINE W REFLEX MICROSCOPIC
Bacteria, UA: NONE SEEN
Bilirubin Urine: NEGATIVE
Glucose, UA: 50 mg/dL — AB
Ketones, ur: 5 mg/dL — AB
Leukocytes,Ua: NEGATIVE
Nitrite: NEGATIVE
Protein, ur: 100 mg/dL — AB
Specific Gravity, Urine: 1.025 (ref 1.005–1.030)
pH: 5 (ref 5.0–8.0)

## 2020-04-05 LAB — LIPASE, BLOOD: Lipase: 200 U/L — ABNORMAL HIGH (ref 11–51)

## 2020-04-05 LAB — GLUCOSE, CAPILLARY
Glucose-Capillary: 186 mg/dL — ABNORMAL HIGH (ref 70–99)
Glucose-Capillary: 192 mg/dL — ABNORMAL HIGH (ref 70–99)
Glucose-Capillary: 174 mg/dL — ABNORMAL HIGH (ref 70–99)
Glucose-Capillary: 161 mg/dL — ABNORMAL HIGH (ref 70–99)
Glucose-Capillary: 167 mg/dL — ABNORMAL HIGH (ref 70–99)

## 2020-04-05 LAB — CBC
HCT: 46.1 % (ref 39.0–52.0)
Hemoglobin: 15.5 g/dL (ref 13.0–17.0)
MCH: 32.4 pg (ref 26.0–34.0)
MCHC: 33.6 g/dL (ref 30.0–36.0)
MCV: 96.4 fL (ref 80.0–100.0)
Platelets: 152 10*3/uL (ref 150–400)
RBC: 4.78 MIL/uL (ref 4.22–5.81)
RDW: 13.7 % (ref 11.5–15.5)
WBC: 25.8 10*3/uL — ABNORMAL HIGH (ref 4.0–10.5)
nRBC: 0 % (ref 0.0–0.2)

## 2020-04-05 LAB — LACTIC ACID, PLASMA
Lactic Acid, Venous: 2.6 mmol/L (ref 0.5–1.9)
Lactic Acid, Venous: 2.5 mmol/L (ref 0.5–1.9)

## 2020-04-05 LAB — MAGNESIUM: Magnesium: 2 mg/dL (ref 1.7–2.4)

## 2020-04-05 LAB — PHOSPHORUS: Phosphorus: 2.2 mg/dL — ABNORMAL LOW (ref 2.5–4.6)

## 2020-04-05 IMAGING — DX DG CHEST 1V PORT
1 series · 2 of 2 positions shown · non-contrast
Comparison: [DATE]

CLINICAL DATA: Shortness of breath.

EXAM:
PORTABLE CHEST 1 VIEW

[Series 1: chest ap · 0.14mm/px · 2 of 2 slices shown]
[im 1/2]
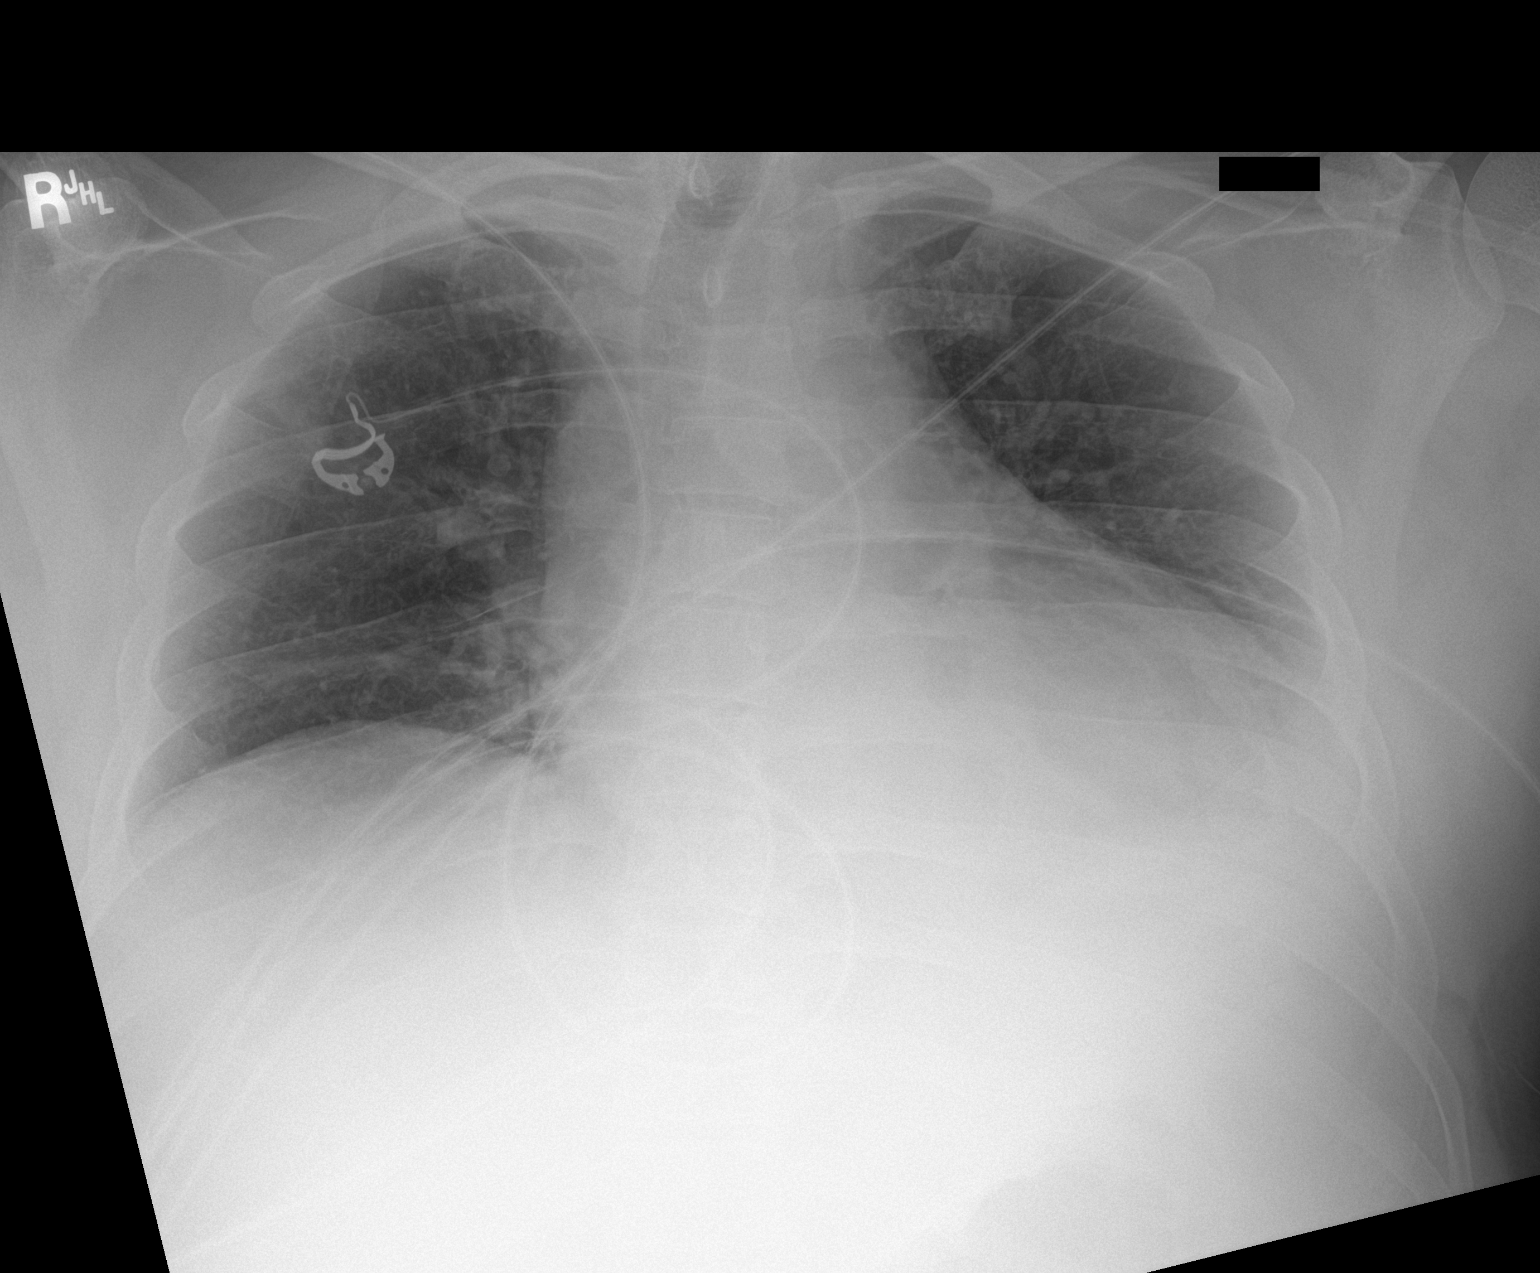
[im 2/2]
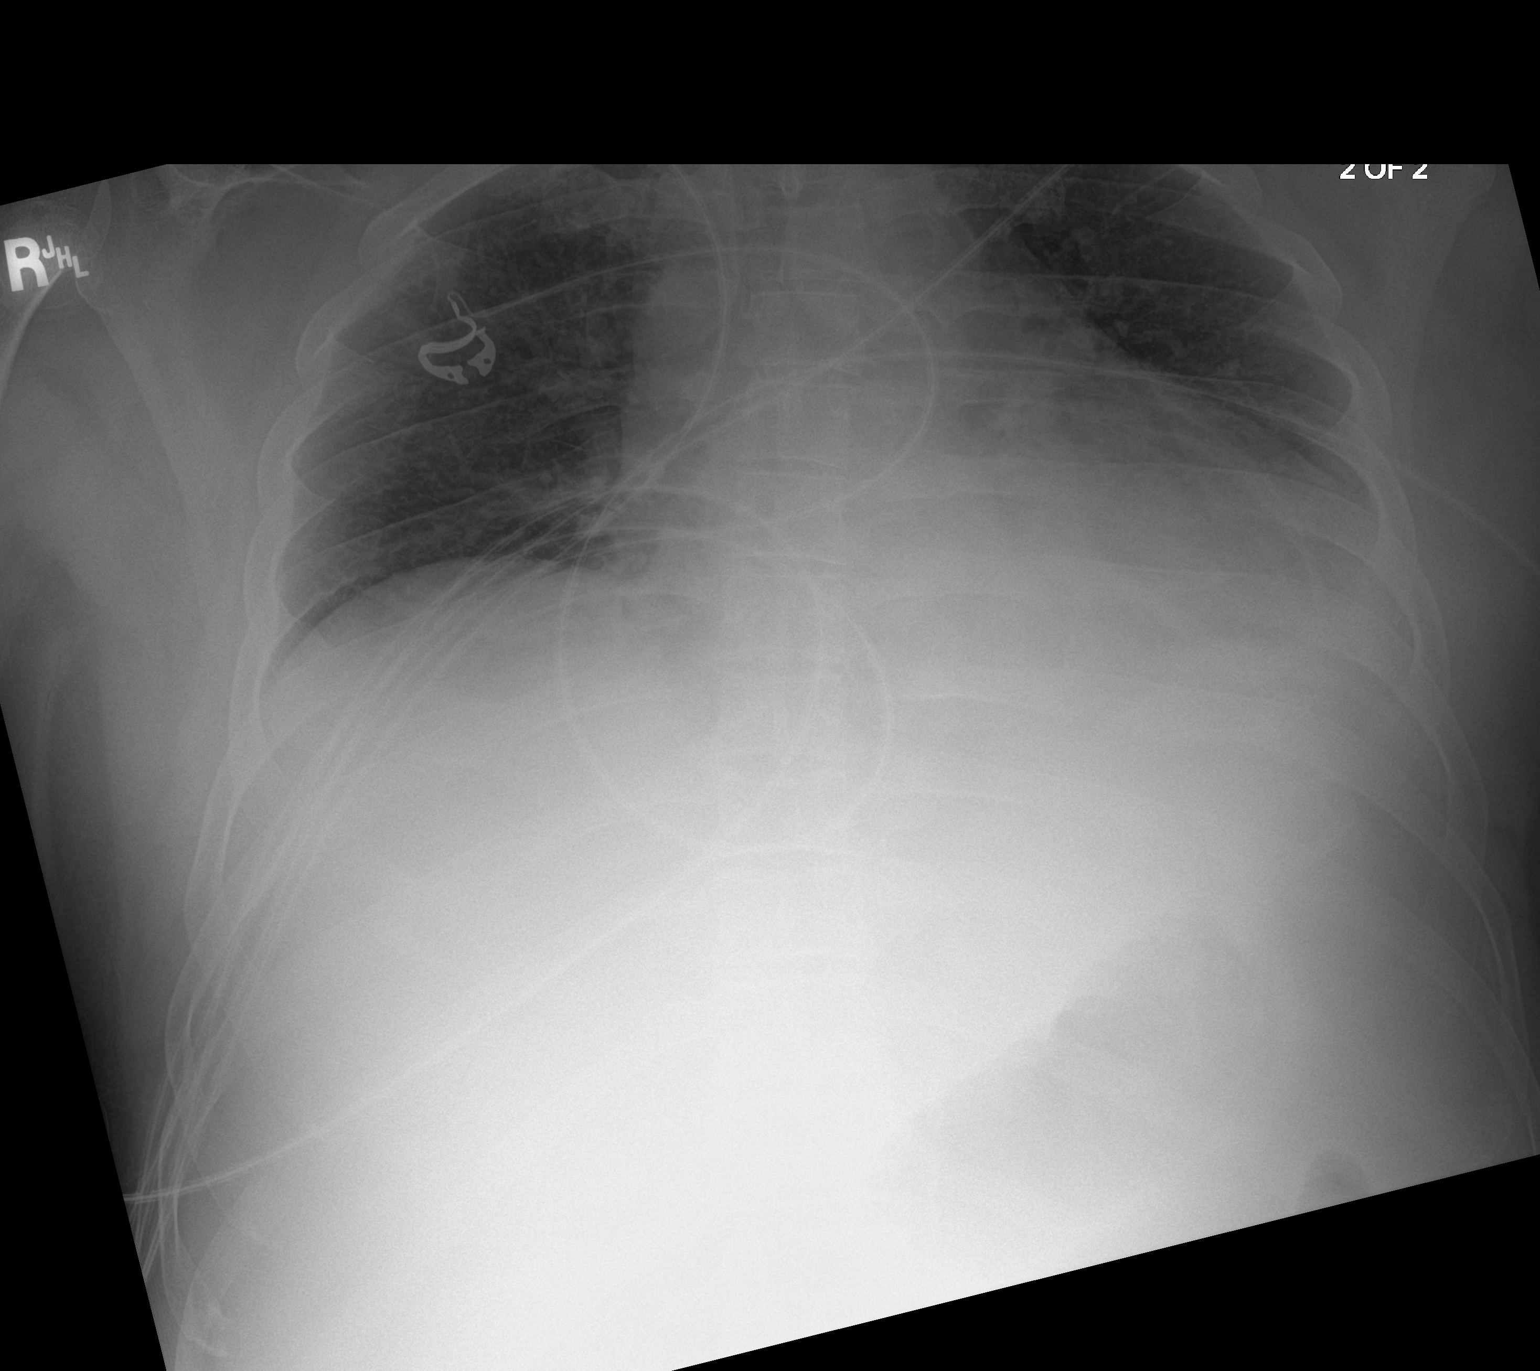

[2 of 2 positions shown; findings below may reference images not displayed]

FINDINGS: The lung volumes are low. There is opacity within the left base
which may represent atelectasis, pleural effusion or airspace
consolidation. Right lung appears clear.
IMPRESSION: 1. Low lung volumes.
2. Left base opacity compatible with effusion, atelectasis and/or
airspace consolidation.

## 2020-04-05 MED ORDER — IPRATROPIUM-ALBUTEROL 0.5-2.5 (3) MG/3ML IN SOLN
3.0000 mL | Freq: Three times a day (TID) | RESPIRATORY_TRACT | Status: DC
  Administered 2020-04-06 – 2020-04-08 (×7): 3 mL via RESPIRATORY_TRACT
  Filled 2020-04-05 (×6): qty 3

## 2020-04-05 MED ORDER — IPRATROPIUM-ALBUTEROL 0.5-2.5 (3) MG/3ML IN SOLN
3.0000 mL | Freq: Four times a day (QID) | RESPIRATORY_TRACT | Status: DC
  Administered 2020-04-05 (×2): 3 mL via RESPIRATORY_TRACT
  Filled 2020-04-05: qty 3

## 2020-04-05 MED ORDER — DEXMEDETOMIDINE HCL IN NACL 200 MCG/50ML IV SOLN
0.4000 ug/kg/h | INTRAVENOUS | Status: DC
  Administered 2020-04-05 (×2): 1.2 ug/kg/h via INTRAVENOUS
  Administered 2020-04-05: 0.7 ug/kg/h via INTRAVENOUS
  Administered 2020-04-05: 0.2 ug/kg/h via INTRAVENOUS
  Administered 2020-04-05 (×2): 0.8 ug/kg/h via INTRAVENOUS
  Administered 2020-04-05: 0.9 ug/kg/h via INTRAVENOUS
  Administered 2020-04-05: 0.6 ug/kg/h via INTRAVENOUS
  Administered 2020-04-05: 0.8 ug/kg/h via INTRAVENOUS
  Administered 2020-04-05: 1 ug/kg/h via INTRAVENOUS
  Administered 2020-04-06: 0.8 ug/kg/h via INTRAVENOUS
  Administered 2020-04-06: 1.1 ug/kg/h via INTRAVENOUS
  Administered 2020-04-06: 1.2 ug/kg/h via INTRAVENOUS
  Administered 2020-04-06: 0.8 ug/kg/h via INTRAVENOUS
  Administered 2020-04-06: 1.1 ug/kg/h via INTRAVENOUS
  Administered 2020-04-06: 1.2 ug/kg/h via INTRAVENOUS
  Administered 2020-04-06 – 2020-04-07 (×4): 0.8 ug/kg/h via INTRAVENOUS
  Administered 2020-04-07: 0.3 ug/kg/h via INTRAVENOUS
  Administered 2020-04-07: 0.8 ug/kg/h via INTRAVENOUS
  Filled 2020-04-05 (×9): qty 50
  Filled 2020-04-05: qty 100
  Filled 2020-04-05 (×3): qty 50
  Filled 2020-04-05: qty 100
  Filled 2020-04-05 (×4): qty 50
  Filled 2020-04-05: qty 100
  Filled 2020-04-05: qty 50
  Filled 2020-04-05: qty 100
  Filled 2020-04-05: qty 50

## 2020-04-05 MED ORDER — SODIUM CHLORIDE 0.9 % IV BOLUS
1000.0000 mL | Freq: Once | INTRAVENOUS | Status: AC
  Administered 2020-04-05: 1000 mL via INTRAVENOUS

## 2020-04-05 MED ORDER — SODIUM CHLORIDE 0.9 % IV SOLN: INTRAVENOUS | Status: DC

## 2020-04-05 MED ORDER — ALBUTEROL SULFATE (2.5 MG/3ML) 0.083% IN NEBU: 2.5000 mg | INHALATION_SOLUTION | Freq: Four times a day (QID) | RESPIRATORY_TRACT | Status: DC | PRN

## 2020-04-05 MED ORDER — ALBUTEROL SULFATE (2.5 MG/3ML) 0.083% IN NEBU: 2.5000 mg | INHALATION_SOLUTION | RESPIRATORY_TRACT | Status: DC | PRN

## 2020-04-05 MED ORDER — CHLORDIAZEPOXIDE HCL 5 MG PO CAPS
5.0000 mg | ORAL_CAPSULE | Freq: Three times a day (TID) | ORAL | Status: DC
  Administered 2020-04-05 (×3): 5 mg via ORAL
  Filled 2020-04-05 (×3): qty 1

## 2020-04-05 NOTE — Progress Notes (Signed)
Spoke with Dr Francine Graven, critical care attending. Currently on precedex drip with acute pancreatitis. Dr Francine Graven will be taking over for further care. Patient will be transferred to [facility] service when off precedex drip.

## 2020-04-05 NOTE — Consult Note (Addendum)
NAME:  Peter [name], MRN:  [id number], DOB:  [birth year], LOS: 1 ADMISSION DATE:  04/04/2020, CONSULTATION DATE:  2/13 REFERRING MD:  EDP, CHIEF COMPLAINT:  abd pain/ hbp   Brief History:  39 yowm with HBP on clonidine/BB and smoker/ heavy etoh (1 bottle whiskey a day x weeks PTA) with last drink pm 2/12 admitted with abd pain ? DT's with EDP requesting admit to ICU for precedex so consulted PCCM am 2/13   History of Present Illness:  40 y.o. male with a hx of alcohol abuse, hypertension, pancreatitis, depression, and anxiety who presents to the ED with complaints of abdominal pain onset 3-4 days PTA. Patient states pain is located primarily in the epigastrium, it is constant, severe, no alleviating/aggravating factors. Has had associated nausea without vomiting. Feels like his prior pancreatitis. He is also having alcohol withdrawal, drinks a fifth of liquor daily, last drink was @ 1900 2/12  having anxiety, shakiness, and abdominal pain in this regard. He would like to stop drinking. He has hx of withdrawal, no hx of prior withdrawal seizures, states he maybe had mild DTs before.  He denies hematemesis, vomiting, diarrhea, melena, hematochezia, syncope, dysuria, seizure, hallucinations, or dyspnea.  Past Medical History:  Alcoholism Hypertension on clonidine pre admit Substance abuse   Significant Hospital Events:    Consults:  PCCM  Procedures:    Significant Diagnostic Tests:  Abd CT 2/13 1. Acute Pancreatitis. Upper abdominal inflammation with small volume free fluid in the abdomen, pelvic inlet. No pancreatic necrosis. No organized or drainable fluid collection Attenuated splenic vein, but no definite vascular thrombosis at this time. 2. Dilated urinary bladder, estimated at 780 mL. 3. Severe hepatic steatosis.  Micro Data:  Resp viral panel  2/13 - negative  Antimicrobials:  n/a  Interim History / Subjective:  He is feeling better since admission. He is having  shortness of breath this morning and wheezing per nursing. His pain is much better than yesterday.   He had increasing agitation overnight and precedex was started.  Objective   Blood pressure 112/69, pulse 87, temperature 98.5 F (36.9 C), temperature source Oral, resp. rate (!) 37, height 5\' 10"  (1.778 m), weight 103.4 kg, SpO2 90 %.        Intake/Output Summary (Last 24 hours) at 04/05/2020 1036 Last data filed at 04/05/2020 04/07/2020 Gross per 24 hour  Intake 2339.76 ml  Output 700 ml  Net 1639.76 ml   Filed Weights   04/04/20 0004  Weight: 103.4 kg    Examination: General: young male, mild respiratory distress HENT: [place]/AT, PERRL, moist mucous membranes Lungs: diminished breath sounds throughout with scattered wheezing. Cardiovascular: RRR, no murmurs Abdomen: soft, non-tender, mildly distended, BS+ Extremities: warm, trace edema Neuro: alert, following commands GU: No foley in place    Resolved Hospital Problem list    AG Acidosis  Assessment & Plan:  Pancreatitis/sepsis  with lactic acidosis.  - IV fluid resuscitation has been discontinued this AM - Will try clears today - pain is improved.   Alcohol Abuse with withdrawal - last drink was 1900 2/12  - Has been requiring PRN benzodiazepine pushes and precedex was added overnight for increasing benzodiazepine need. - Will add librium 5mg  TID in order to wean off the precedex drip. Will increase if he has a high need for PRN benzodiazepines.  Shortness of Breath/Wheezing - History of smoking so concern for underlying asthma vs fluid overload from fluid resuscitation - We will check a chest radiograph - Start duonebs  scheduled and PRN albuterol nebs  HBP with probable clonidine rebound  - conlidine resumed overnight with improvements in blood pressure  Acute Kidney Injury Hyperkalemia Cr Baseline 0.96, today is 1.43 - Will continue IV fluids - Check urine sodium and urine Cr  Lactic Acidosis - Improving with  fluids  Best practice (evaluated daily)  Diet: npo x chips/sips Pain/Anxiety/Delirium protocol (if indicated): benzo's per protocol VAP protocol (if indicated): n/a DVT prophylaxis: pas GI prophylaxis: ppi Glucose control: ssi Mobility: bed rest Disposition: step down  Code status: full code      Labs   CBC: Recent Labs  Lab 04/04/20 0048 04/05/20 0334  WBC 24.8* 25.8*  NEUTROABS 22.5*  --   HGB 17.1* 15.5  HCT 49.3 46.1  MCV 93.4 96.4  PLT 204 152    Basic Metabolic Panel: Recent Labs  Lab 04/04/20 0048 04/04/20 1155 04/05/20 0334  NA 133*  --  130*  K 3.7  --  6.2*  CL 95*  --  97*  CO2 15*  --  21*  GLUCOSE 277*  --  184*  BUN 9  --  18  CREATININE 0.94  --  0.96  CALCIUM 8.8*  --  8.2*  MG  --  1.4*  --   PHOS  --  3.0  --    GFR: Estimated Creatinine Clearance: 124.5 mL/min (by C-G formula based on SCr of 0.96 mg/dL). Recent Labs  Lab 04/04/20 0048 04/04/20 0313 04/04/20 0525 04/04/20 1452 04/04/20 1820 04/05/20 0334  WBC 24.8*  --   --   --   --  25.8*  LATICACIDVEN  --  10.0* 9.5* 6.6* 5.6*  --     Liver Function Tests: Recent Labs  Lab 04/04/20 0048 04/05/20 0334  AST 59* 69*  ALT 73* 59*  ALKPHOS 67 48  BILITOT 0.8 3.3*  PROT 8.0 6.3*  ALBUMIN 4.2 3.1*   Recent Labs  Lab 04/04/20 0048 04/05/20 0334  LIPASE 1,076* 200*   No results for input(s): AMMONIA in the last 168 hours.  ABG No results found for: PHART, PCO2ART, PO2ART, HCO3, TCO2, ACIDBASEDEF, O2SAT   Coagulation Profile: Recent Labs  Lab 04/04/20 0602  INR 1.1    Cardiac Enzymes: No results for input(s): CKTOTAL, CKMB, CKMBINDEX, TROPONINI in the last 168 hours.  HbA1C: Hemoglobin A1C  Date/Time Value Ref Range Status  01/28/2019 03:21 PM 5.7 (A) 4.0 - 5.6 % Final   Hgb A1c MFr Bld  Date/Time Value Ref Range Status  04/04/2020 08:27 AM 6.6 (H) 4.8 - 5.6 % Final    Comment:    (NOTE) Pre diabetes:          5.7%-6.4%  Diabetes:               >6.4%  Glycemic control for   <7.0% adults with diabetes     CBG: Recent Labs  Lab 04/04/20 1723 04/04/20 1939 04/04/20 2321 04/05/20 0513 04/05/20 0751  GLUCAP 184* 176* 192* 186* 192*        Past Medical History:  He,  has a past medical history of Alcoholism (HCC), DKA (diabetic ketoacidosis) (HCC) (04/04/2020), Hypertension, and Substance abuse (HCC).   Surgical History:   Past Surgical History:  Procedure Laterality Date  . DENTAL SURGERY    . TIBIA IM NAIL INSERTION Right 01/22/2018   Procedure: RIGHT INTRAMEDULLARY (IM) NAIL TIBIAL;  Surgeon: Sheral Apley, MD;  Location: [facility] OR;  Service: Orthopedics;  Laterality: Right;     Social  History:   reports that he has been smoking cigarettes. He has been smoking about 0.50 packs per day. He has never used smokeless tobacco. He reports current alcohol use. He reports that he does not use drugs.   Family History:  His family history includes CAD in his father; Cancer in his paternal grandfather; Hyperlipidemia in his paternal grandfather; Hypertension in his father and paternal grandfather; Stroke in his father.   Allergies No Known Allergies   Home Medications  Prior to Admission medications   Medication Sig Start Date End Date Taking? Authorizing Provider  amLODipine (NORVASC) 5 MG tablet Take 1 tablet (5 mg total) by mouth daily. 01/28/19   Kallie Locks, FNP  aspirin EC 81 MG tablet Take 1 tablet (81 mg total) by mouth 2 (two) times daily. For DVT prophylaxis for 30 days after surgery. 01/22/18   Martensen, Lucretia Kern III, PA-C  busPIRone (BUSPAR) 7.5 MG tablet Take 1 tablet (7.5 mg total) by mouth 3 (three) times daily. Patient not taking: Reported on 04/04/2020 11/04/19   Kallie Locks, FNP  cloNIDine (CATAPRES) 0.1 MG tablet Take 1 tablet (0.1 mg total) by mouth 2 (two) times daily. 11/04/19   Kallie Locks, FNP  folic acid (FOLVITE) 1 MG tablet Take 1 tablet (1 mg total) by mouth daily. 05/21/17    Rolly Salter, MD  gabapentin (NEURONTIN) 300 MG capsule Take 1 capsule (300 mg total) by mouth 3 (three) times daily for 14 days. For 2 weeks post op for pain. Patient not taking: Reported on 04/04/2020 01/22/18 02/05/18  Albina Billet III, PA-C  hydrALAZINE (APRESOLINE) 50 MG tablet Take 50 mg by mouth every 8 (eight) hours. 02/28/20   [provider]  losartan (COZAAR) 100 MG tablet Take 1 tablet (100 mg total) by mouth daily. Patient not taking: No sig reported 11/04/19   Kallie Locks, FNP  magnesium oxide (MAG-OX) 400 MG tablet Take 1 tablet by mouth 2 (two) times daily. 02/28/20   [provider]  metoprolol tartrate (LOPRESSOR) 25 MG tablet Take 1 tablet (25 mg total) by mouth 2 (two) times daily. Patient to keep follow up appointment to receive additional refills. 11/04/19   Kallie Locks, FNP  multivitamin (ONE-A-DAY MEN'S) TABS tablet Take 1 tablet by mouth daily.    [provider]  pantoprazole (PROTONIX) 40 MG tablet Take 40 mg by mouth daily. 02/28/20   [provider]  thiamine 100 MG tablet Take 100 mg by mouth daily. 02/28/20   [provider]  venlafaxine XR (EFFEXOR-XR) 75 MG 24 hr capsule Take 1 capsule (75 mg total) by mouth daily with breakfast. Patient not taking: Reported on 04/04/2020 11/04/19 12/04/19  Kallie Locks, FNP      CC Time: 40 minutes   Melody Comas, MD [facility] Office: [phone]   See Amion for Pager Details    After 7:00 pm call Elink  [phone]

## 2020-04-05 NOTE — TOC Initial Note (Signed)
Transition of Care [facility]) - Initial/Assessment Note    Patient Details  Name: Peter [name] MRN: [id number] Date of Birth: 1980/09/24  Transition of [facility]) CM/SW Contact:    Golda Acre, RN Phone Number: 04/05/2020, 7:50 AM  Clinical Narrative:                 40 year old male with past medical history of alcohol abuse, hypertension, pancreatitis, recent acute cholecystitis in January at [facility], depression and anxiety who presented to the ED with complaints of constant, severe epigastric abdominal pain x3 to 4 days.  Patient drinks 1/5 of whiskey daily and his last drink was yesterday at 1900.  Now with anxiety and shakiness in addition to the abdominal pain.  Feels like this is similar to prior withdrawal and pancreatitis episodes.  He wishes to quit drinking and so he decided to quit suddenly.  Denies auditory or visual hallucinations.  No history of withdrawal seizures but did have an episode of withdrawal hallucinations in the past.  No blood in stool or vomit.  No other complaints.  Of note, he was diagnosed with acute cholecystitis at the beginning of January, 2022 via MRCP at [facility]. Surgery recommended elective outpatient cholecystectomy two weeks from discharge however he did not go for follow up due to lack of insurance.  PLAN TO RETURN HOME. WILL NEED SUBSTANCE ABUSE RESOURCES WHEN STABLE. Expected Discharge Plan: Home/Self Care Barriers to Discharge: Continued Medical Work up   Patient Goals and CMS Choice Patient states their goals for this hospitalization and ongoing recovery are:: TO QUIT DRINKING AND GET MY LIFE ON TRACK CMS Medicare.gov Compare Post Acute Care list provided to:: Patient    Expected Discharge Plan and Services Expected Discharge Plan: Home/Self Care   Discharge Planning Services: CM Consult   Living arrangements for the past 2 months: Apartment                                      Prior Living Arrangements/Services Living  arrangements for the past 2 months: Apartment Lives with:: Self Patient language and need for interpreter reviewed:: Yes Do you feel safe going back to the place where you live?: Yes      Need for Family Participation in Patient Care: Yes (Comment) (MOTHER) Care giver support system in place?: Yes (comment) (MOTHER)   Criminal Activity/Legal Involvement Pertinent to Current Situation/Hospitalization: No - Comment as needed  Activities of Daily Living Home Assistive Devices/Equipment: None ADL Screening (condition at time of admission) Patient's cognitive ability adequate to safely complete daily activities?: Yes Is the patient deaf or have difficulty hearing?: No Does the patient have difficulty seeing, even when wearing glasses/contacts?: No Does the patient have difficulty concentrating, remembering, or making decisions?: No Patient able to express need for assistance with ADLs?: Yes Does the patient have difficulty dressing or bathing?: No Independently performs ADLs?: Yes (appropriate for developmental age) Does the patient have difficulty walking or climbing stairs?: No Weakness of Legs: None Weakness of Arms/Hands: None  Permission Sought/Granted                  Emotional Assessment Appearance:: Appears stated age Attitude/Demeanor/Rapport: Unable to Assess Affect (typically observed): Unable to Assess Orientation: : Fluctuating Orientation (Suspected and/or reported Sundowners) Alcohol / Substance Use: Illicit Drugs,Alcohol Use,Tobacco Use Psych Involvement: No (comment)  Admission diagnosis:  Lactic acidosis [E87.2] Pancreatitis, acute [K85.90] High anion gap metabolic acidosis [  E87.2] Alcohol withdrawal syndrome without complication (HCC) [F10.230] Acute pancreatitis, unspecified complication status, unspecified pancreatitis type [K85.90] Patient Active Problem List   Diagnosis Date Noted  . Lactic acidosis 04/04/2020  . DKA (diabetic ketoacidosis) (HCC)  04/04/2020  . Pancreatitis, acute 04/04/2020  . High anion gap metabolic acidosis   . Generalized anxiety disorder 01/29/2019  . Depression 01/29/2019  . Right tibial fracture 01/21/2018  . Chest pain 05/20/2017  . Hypomagnesemia 02/15/2017  . Abdominal pain 02/14/2017  . Acute pancreatitis 02/14/2017  . Alcoholic hepatitis without ascites 02/14/2017  . Hypokalemia 12/03/2016  . Leukocytosis 12/03/2016  . Alcohol withdrawal (HCC) 12/02/2016  . Alcohol dependence with other alcohol-induced disorder (HCC) 11/18/2016  . 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 Essential hypertension 11/18/2016  . Elevated liver enzymes 11/14/2016   PCP:  Kallie Locks, FNP Pharmacy:   [facility] [address], [place] - [address] [place] [address] Phone: [phone] Fax: [phone]     Social Determinants of Health (SDOH) Interventions    Readmission Risk Interventions No flowsheet data found.

## 2020-04-05 NOTE — Progress Notes (Signed)
eLink Physician-Brief Progress Note Patient Name: Peter [name] DOB: 1980-04-07 MRN: [id number]   Date of Service  04/05/2020  HPI/Events of Note  RN is exhausting PRN Ativan with pt's ETOH Withdrawls, asking for Precedex  173/113  HR 131  eICU Interventions  Ordered precedex. Hand off to do list reviewed for same earlier.  Asp /seizure precautions.      Intervention Category Intermediate Interventions: Other:  Ranee Gosselin 04/05/2020, 12:27 AM

## 2020-04-05 NOTE — Progress Notes (Signed)
Inpatient Diabetes Program Recommendations  AACE/ADA: New Consensus Statement on Inpatient Glycemic Control (2015)  Target Ranges:  Prepandial:   less than 140 mg/dL      Peak postprandial:   less than 180 mg/dL (1-2 hours)      Critically ill patients:  140 - 180 mg/dL   Lab Results  Component Value Date   GLUCAP 174 (H) 04/05/2020   HGBA1C 6.6 (H) 04/04/2020    Review of Glycemic Control Results for Peter [name], Peter [name] (MRN [id number]) as of 04/05/2020 15:38  Ref. Range 04/04/2020 19:39 04/04/2020 23:21 04/05/2020 05:13 04/05/2020 07:51 04/05/2020 11:23  Glucose-Capillary Latest Ref Range: 70 - 99 mg/dL 962 (H) 952 (H) 841 (H) 192 (H) 174 (H)   Diabetes history: None Outpatient Diabetes medications:  None Current orders for Inpatient glycemic control:  None Inpatient Diabetes Program Recommendations:   Note elevated A1C of 6.6%.  ? If this is from pancreatitis. May consider adding Novolog very sensitive (0-6 units) q 4 hours.   Thanks,  Beryl Meager, RN, BC-ADM Inpatient Diabetes Coordinator Pager [phone] (8a-5p)

## 2020-04-05 NOTE — Progress Notes (Signed)
eLink Physician-Brief Progress Note Patient Name: Peter [name] DOB: Aug 10, 1980 MRN: [id number]   Date of Service  04/05/2020  HPI/Events of Note  Increasing agitation, asking to increase ceiling on precedex. Got 4 mg CIWA ativan. HR 105.   eICU Interventions  Increased ceiling up to 1.2 for now.      Intervention Category Minor Interventions: Agitation / anxiety - evaluation and management  Ranee Gosselin 04/05/2020, 4:36 AM

## 2020-04-05 NOTE — Progress Notes (Signed)
eLink Physician-Brief Progress Note Patient Name: Peter [name] DOB: [birth year] MRN: [id number]   Date of Service  04/05/2020  HPI/Events of Note  Nursing concerned about expiratory wheezing. Albuterol Nebs ordered, however, not given. Video assessment patient sleeping. Looks comfortable. Sat = 64% and RR = 27.   eICU Interventions  Plan: 1. Increase Albuterol Nebs to Q 4 hours PRN wheezing or SOB.     Intervention Category Major Interventions: Other:  Lenell Antu 04/05/2020, 11:07 PM

## 2020-04-06 DIAGNOSIS — K8521 Alcohol induced acute pancreatitis with uninfected necrosis: Secondary | ICD-10-CM

## 2020-04-06 LAB — CBC
HCT: 36.5 % — ABNORMAL LOW (ref 39.0–52.0)
Hemoglobin: 12.5 g/dL — ABNORMAL LOW (ref 13.0–17.0)
MCH: 32.6 pg (ref 26.0–34.0)
MCHC: 34.2 g/dL (ref 30.0–36.0)
MCV: 95.3 fL (ref 80.0–100.0)
Platelets: 117 10*3/uL — ABNORMAL LOW (ref 150–400)
RBC: 3.83 MIL/uL — ABNORMAL LOW (ref 4.22–5.81)
RDW: 13.9 % (ref 11.5–15.5)
WBC: 15.8 10*3/uL — ABNORMAL HIGH (ref 4.0–10.5)
nRBC: 0 % (ref 0.0–0.2)

## 2020-04-06 LAB — COMPREHENSIVE METABOLIC PANEL
ALT: 32 U/L (ref 0–44)
AST: 28 U/L (ref 15–41)
Albumin: 2.6 g/dL — ABNORMAL LOW (ref 3.5–5.0)
Alkaline Phosphatase: 51 U/L (ref 38–126)
Anion gap: 10 (ref 5–15)
BUN: 25 mg/dL — ABNORMAL HIGH (ref 6–20)
CO2: 23 mmol/L (ref 22–32)
Calcium: 7.9 mg/dL — ABNORMAL LOW (ref 8.9–10.3)
Chloride: 100 mmol/L (ref 98–111)
Creatinine, Ser: 1.05 mg/dL (ref 0.61–1.24)
GFR, Estimated: >60 mL/min (ref >60)
Glucose, Bld: 145 mg/dL — ABNORMAL HIGH (ref 70–99)
Potassium: 4.4 mmol/L (ref 3.5–5.1)
Sodium: 133 mmol/L — ABNORMAL LOW (ref 135–145)
Total Bilirubin: 1.3 mg/dL — ABNORMAL HIGH (ref 0.3–1.2)
Total Protein: 6 g/dL — ABNORMAL LOW (ref 6.5–8.1)

## 2020-04-06 LAB — GLUCOSE, CAPILLARY
Glucose-Capillary: 127 mg/dL — ABNORMAL HIGH (ref 70–99)
Glucose-Capillary: 136 mg/dL — ABNORMAL HIGH (ref 70–99)
Glucose-Capillary: 139 mg/dL — ABNORMAL HIGH (ref 70–99)
Glucose-Capillary: 142 mg/dL — ABNORMAL HIGH (ref 70–99)
Glucose-Capillary: 154 mg/dL — ABNORMAL HIGH (ref 70–99)

## 2020-04-06 MED ORDER — ORAL CARE MOUTH RINSE
15.0000 mL | Freq: Two times a day (BID) | OROMUCOSAL | Status: DC
  Administered 2020-04-06 – 2020-04-15 (×15): 15 mL via OROMUCOSAL

## 2020-04-06 MED ORDER — CHLORDIAZEPOXIDE HCL 5 MG PO CAPS
15.0000 mg | ORAL_CAPSULE | Freq: Three times a day (TID) | ORAL | Status: DC
  Administered 2020-04-06 – 2020-04-09 (×10): 15 mg via ORAL
  Filled 2020-04-06 (×10): qty 1

## 2020-04-06 MED ORDER — NICOTINE 14 MG/24HR TD PT24
14.0000 mg | MEDICATED_PATCH | Freq: Every day | TRANSDERMAL | Status: DC
  Administered 2020-04-06 – 2020-04-15 (×10): 14 mg via TRANSDERMAL
  Filled 2020-04-06 (×10): qty 1

## 2020-04-06 MED ORDER — SODIUM CHLORIDE 0.9 % IV SOLN
2.0000 g | INTRAVENOUS | Status: DC
  Administered 2020-04-06 – 2020-04-10 (×5): 2 g via INTRAVENOUS
  Filled 2020-04-06 (×2): qty 20
  Filled 2020-04-06 (×2): qty 2
  Filled 2020-04-06: qty 20

## 2020-04-06 MED ORDER — LIP MEDEX EX OINT
TOPICAL_OINTMENT | CUTANEOUS | Status: AC
  Filled 2020-04-06: qty 7

## 2020-04-06 MED ORDER — SODIUM CHLORIDE 0.9 % IV BOLUS
1000.0000 mL | Freq: Once | INTRAVENOUS | Status: AC
  Administered 2020-04-06: 1000 mL via INTRAVENOUS

## 2020-04-06 MED ORDER — CHLORDIAZEPOXIDE HCL 10 MG PO CAPS: 10.0000 mg | ORAL_CAPSULE | Freq: Three times a day (TID) | ORAL | Status: DC

## 2020-04-06 NOTE — Progress Notes (Signed)
eLink Physician-Brief Progress Note Patient Name: Jermine Bibbee DOB: 1980-06-06 MRN: [id number]   Date of Service  04/06/2020  HPI/Events of Note  Tobacco abuse - Patient smokes 7 cigarettes per day.   eICU Interventions  Plan: 1. Nicotine patch 14 mg to skin now and Q day.      Intervention Category Major Interventions: Other:  Lenell Antu 04/06/2020, 8:34 PM

## 2020-04-06 NOTE — Progress Notes (Addendum)
NAME:  Aren Pryde, MRN:  [id number], DOB:  20-Jun-1980, LOS: 2 ADMISSION DATE:  04/04/2020, CONSULTATION DATE:  2/13 REFERRING MD:  EDP, CHIEF COMPLAINT:  abd pain/ hbp   Brief History:  39 yowm with HBP on clonidine/BB and smoker/ heavy etoh (1 bottle whiskey a day x weeks PTA) with last drink pm 2/12 admitted with abd pain ? DT's with EDP requesting admit to ICU for precedex so consulted PCCM am 2/13   History of Present Illness:  40 y.o. male with a hx of alcohol abuse, hypertension, pancreatitis, depression, and anxiety who presents to the ED with complaints of abdominal pain onset 3-4 days PTA. Patient states pain is located primarily in the epigastrium, it is constant, severe, no alleviating/aggravating factors. Has had associated nausea without vomiting. Feels like his prior pancreatitis. He is also having alcohol withdrawal, drinks a fifth of liquor daily, last drink was @ 1900 2/12  having anxiety, shakiness, and abdominal pain in this regard. He would like to stop drinking. He has hx of withdrawal, no hx of prior withdrawal seizures, states he maybe had mild DTs before.  He denies hematemesis, vomiting, diarrhea, melena, hematochezia, syncope, dysuria, seizure, hallucinations, or dyspnea.  Past Medical History:  Alcoholism Hypertension on clonidine pre admit Substance abuse   Significant Hospital Events:    Consults:  PCCM  Procedures:    Significant Diagnostic Tests:  Abd CT 2/13 1. Acute Pancreatitis. Upper abdominal inflammation with small volume free fluid in the abdomen, pelvic inlet. No pancreatic necrosis. No organized or drainable fluid collection Attenuated splenic vein, but no definite vascular thrombosis at this time. 2. Dilated urinary bladder, estimated at 780 mL. 3. Severe hepatic steatosis.  Micro Data:  Resp viral panel  2/13 - negative  Antimicrobials:  n/a  Interim History / Subjective:  Patient more agitated and delirious yesterday,  required continuous need for precedex so he was transferred to the [facility] service.   His breathing is improved with nebulizer treatments. He is tolerating PO diet.   Foley catheter placed overnight due to urinary retention  Objective   Blood pressure 114/73, pulse 83, temperature 98.4 F (36.9 C), temperature source Axillary, resp. rate (!) 28, height 5\' 10"  (1.778 m), weight 103.4 kg, SpO2 95 %.        Intake/Output Summary (Last 24 hours) at 04/06/2020 0734 Last data filed at 04/06/2020 0542 Gross per 24 hour  Intake 1506.19 ml  Output 1575 ml  Net -68.81 ml   Filed Weights   04/04/20 0004  Weight: 103.4 kg    Examination: General: young male, resting comfortably in bed HENT: [place]/AT, PERRL, moist mucous membranes Lungs: no wheezing, improved air moement. No rales Cardiovascular: RRR, no murmurs Abdomen: soft, non-tender, moderately distended, BS+ Extremities: warm, no edema Neuro: alert to verbal stimuli, moving all extremities. Alert to person. GU: foley in place    Resolved Hospital Problem list   AG Acidosis Lactic Acidosis   Assessment & Plan:  Pancreatitis/ Severe sepsis  - IV fluid boluses as needed, will give NS bolus this AM - Tolerating PO diet, advance as tolerated - pain is controlled with current regimen of PRN dilaudid and PO oxycodone   Alcohol Abuse with withdrawal - last drink was 1900 2/12  - increase librium from 5mg  TID to 15mg  TID - continue CIWA protocol for PRN IV ativan pushes - continue precedex, wean as able  Acute Kidney Injury, non-oliguric Hyperkalemia Hyponatremia Cr Baseline 0.96, yesterday is 1.43. Awaiting labs from today -  Will continue IV fluid boluses as needed, give 1L NS this AM  Acute Hypoxemic Respiratory Failure In setting of severe sepsis and pancreatitis along with history of smoking so concern for underlying asthma vs fluid overload from fluid resuscitation - Chest radiograph with poor inspiratory effort on 2/14,  concern for left base opacity with effusion or atelectasis.  - Will take a look with bedside US tday - Continue duonebs scheduled and PRN albuterol nebs - Wean oxygen as able   Hypertension - continue home clonidine - continue amlodipine   Best practice (evaluated daily)  Diet: Clears, advance as tolerated Pain/Anxiety/Delirium protocol (if indicated): benzo's per protocol, PRN dilaudid VAP protocol (if indicated): n/a DVT prophylaxis: lovenox GI prophylaxis: ppi Glucose control: ssi Mobility: bed rest Disposition: ICU Code status: full code    Labs   CBC: Recent Labs  Lab 04/04/20 0048 04/05/20 0334  WBC 24.8* 25.8*  NEUTROABS 22.5*  --   HGB 17.1* 15.5  HCT 49.3 46.1  MCV 93.4 96.4  PLT 204 152    Basic Metabolic Panel: Recent Labs  Lab 04/04/20 0048 04/04/20 1155 04/05/20 0334 04/05/20 1139  NA 133*  --  130* 131*  K 3.7  --  6.2* 5.4*  CL 95*  --  97* 97*  CO2 15*  --  21* 24  GLUCOSE 277*  --  184* 175*  BUN 9  --  18 24*  CREATININE 0.94  --  0.96 1.43*  CALCIUM 8.8*  --  8.2* 8.0*  MG  --  1.4* 2.0  --   PHOS  --  3.0 2.2*  --    GFR: Estimated Creatinine Clearance: 83.6 mL/min (A) (by C-G formula based on SCr of 1.43 mg/dL (H)). Recent Labs  Lab 04/04/20 0048 04/04/20 0313 04/04/20 1452 04/04/20 1820 04/05/20 0334 04/05/20 1139 04/05/20 1432  WBC 24.8*  --   --   --  25.8*  --   --   LATICACIDVEN  --    < > 6.6* 5.6*  --  2.6* 2.5*   < > = values in this interval not displayed.    Liver Function Tests: Recent Labs  Lab 04/04/20 0048 04/05/20 0334  AST 59* 69*  ALT 73* 59*  ALKPHOS 67 48  BILITOT 0.8 3.3*  PROT 8.0 6.3*  ALBUMIN 4.2 3.1*   Recent Labs  Lab 04/04/20 0048 04/05/20 0334  LIPASE 1,076* 200*   No results for input(s): AMMONIA in the last 168 hours.  ABG No results found for: PHART, PCO2ART, PO2ART, HCO3, TCO2, ACIDBASEDEF, O2SAT   Coagulation Profile: Recent Labs  Lab 04/04/20 0602  INR 1.1     Cardiac Enzymes: No results for input(s): CKTOTAL, CKMB, CKMBINDEX, TROPONINI in the last 168 hours.  HbA1C: Hemoglobin A1C  Date/Time Value Ref Range Status  01/28/2019 03:21 PM 5.7 (A) 4.0 - 5.6 % Final   Hgb A1c MFr Bld  Date/Time Value Ref Range Status  04/04/2020 08:27 AM 6.6 (H) 4.8 - 5.6 % Final    Comment:    (NOTE) Pre diabetes:          5.7%-6.4%  Diabetes:              >6.4%  Glycemic control for   <7.0% adults with diabetes     CBG: Recent Labs  Lab 04/05/20 0751 04/05/20 1123 04/05/20 1651 04/05/20 1916 04/06/20 0026  GLUCAP 192* 174* 161* 167* 154*    CC Time: 35 minutes   Melody Comas, MD [place]  [facility] Office: [phone]   See Amion for Pager Details    After 7:00 pm call Elink  [phone]

## 2020-04-06 NOTE — Progress Notes (Signed)
Chaplain engaged in initial visit with Peter [name].  Chaplain introduced herself and offered support.  Chaplain will follow-up.    04/06/20 1200  Clinical Encounter Type  Visited With Patient  Visit Type Initial

## 2020-04-07 LAB — COMPREHENSIVE METABOLIC PANEL
ALT: 26 U/L (ref 0–44)
AST: 24 U/L (ref 15–41)
Albumin: 2.9 g/dL — ABNORMAL LOW (ref 3.5–5.0)
Alkaline Phosphatase: 73 U/L (ref 38–126)
Anion gap: 15 (ref 5–15)
BUN: 18 mg/dL (ref 6–20)
CO2: 22 mmol/L (ref 22–32)
Calcium: 8.6 mg/dL — ABNORMAL LOW (ref 8.9–10.3)
Chloride: 99 mmol/L (ref 98–111)
Creatinine, Ser: 1.01 mg/dL (ref 0.61–1.24)
GFR, Estimated: >60 mL/min (ref >60)
Glucose, Bld: 157 mg/dL — ABNORMAL HIGH (ref 70–99)
Potassium: 3.8 mmol/L (ref 3.5–5.1)
Sodium: 136 mmol/L (ref 135–145)
Total Bilirubin: 1.5 mg/dL — ABNORMAL HIGH (ref 0.3–1.2)
Total Protein: 7.2 g/dL (ref 6.5–8.1)

## 2020-04-07 LAB — GLUCOSE, CAPILLARY
Glucose-Capillary: 118 mg/dL — ABNORMAL HIGH (ref 70–99)
Glucose-Capillary: 121 mg/dL — ABNORMAL HIGH (ref 70–99)
Glucose-Capillary: 121 mg/dL — ABNORMAL HIGH (ref 70–99)
Glucose-Capillary: 123 mg/dL — ABNORMAL HIGH (ref 70–99)
Glucose-Capillary: 126 mg/dL — ABNORMAL HIGH (ref 70–99)
Glucose-Capillary: 139 mg/dL — ABNORMAL HIGH (ref 70–99)
Glucose-Capillary: 165 mg/dL — ABNORMAL HIGH (ref 70–99)

## 2020-04-07 LAB — LIPASE, BLOOD: Lipase: 31 U/L (ref 11–51)

## 2020-04-07 LAB — CBC
HCT: 37.8 % — ABNORMAL LOW (ref 39.0–52.0)
Hemoglobin: 12.8 g/dL — ABNORMAL LOW (ref 13.0–17.0)
MCH: 32.7 pg (ref 26.0–34.0)
MCHC: 33.9 g/dL (ref 30.0–36.0)
MCV: 96.7 fL (ref 80.0–100.0)
Platelets: 145 10*3/uL — ABNORMAL LOW (ref 150–400)
RBC: 3.91 MIL/uL — ABNORMAL LOW (ref 4.22–5.81)
RDW: 14.4 % (ref 11.5–15.5)
WBC: 19.4 10*3/uL — ABNORMAL HIGH (ref 4.0–10.5)
nRBC: 0 % (ref 0.0–0.2)

## 2020-04-07 MED ORDER — METOPROLOL TARTRATE 12.5 MG HALF TABLET
12.5000 mg | ORAL_TABLET | Freq: Two times a day (BID) | ORAL | Status: DC
  Administered 2020-04-07: 12.5 mg via ORAL
  Filled 2020-04-07: qty 1

## 2020-04-07 MED ORDER — VENLAFAXINE HCL ER 75 MG PO CP24
75.0000 mg | ORAL_CAPSULE | Freq: Every day | ORAL | Status: DC
  Administered 2020-04-07 – 2020-04-15 (×9): 75 mg via ORAL
  Filled 2020-04-07 (×9): qty 1

## 2020-04-07 MED ORDER — BUSPIRONE HCL 5 MG PO TABS
7.5000 mg | ORAL_TABLET | Freq: Three times a day (TID) | ORAL | Status: DC
Start: 2020-04-07 — End: 2020-04-15
  Administered 2020-04-07 – 2020-04-15 (×24): 7.5 mg via ORAL
  Filled 2020-04-07 (×24): qty 2

## 2020-04-07 MED ORDER — LACTATED RINGERS IV SOLN: INTRAVENOUS | Status: DC

## 2020-04-07 MED ORDER — METOPROLOL TARTRATE 5 MG/5ML IV SOLN
2.5000 mg | INTRAVENOUS | Status: DC | PRN
  Administered 2020-04-08 (×2): 2.5 mg via INTRAVENOUS
  Filled 2020-04-07 (×2): qty 5

## 2020-04-07 MED ORDER — HYDROMORPHONE HCL 1 MG/ML IJ SOLN
1.0000 mg | INTRAMUSCULAR | Status: DC | PRN
  Administered 2020-04-07 – 2020-04-08 (×4): 1 mg via INTRAVENOUS
  Filled 2020-04-07 (×4): qty 1

## 2020-04-07 NOTE — TOC Progression Note (Signed)
Transition of Care [facility]) - Progression Note    Patient Details  Name: Chong Wojdyla MRN: [id number] Date of Birth: 1980/07/22  Transition of Care [facility]) CM/SW Contact  Golda Acre, RN Phone Number: 04/07/2020, 8:28 AM  Clinical Narrative:    Patient is etoih withdrawal, iv precedex, iv ativan, thiamine and iv rocephin, PLAN: to return to home will need substance abuse resources once stable and awake.   Expected Discharge Plan: Home/Self Care Barriers to Discharge: Continued Medical Work up  Expected Discharge Plan and Services Expected Discharge Plan: Home/Self Care   Discharge Planning Services: CM Consult   Living arrangements for the past 2 months: Apartment                                       Social Determinants of Health (SDOH) Interventions    Readmission Risk Interventions No flowsheet data found.

## 2020-04-07 NOTE — Progress Notes (Signed)
eLink Physician-Brief Progress Note Patient Name: Peter [name] DOB: 22-Sep-1980 MRN: [id number]   Date of Service  04/07/2020  HPI/Events of Note  Patient has sinus tachycardia coupled with oliguria.  eICU Interventions  LR infusion ordered at 100 ml / hour x 24 hours, Metoprolol 2.5 mg iv Q 3 hours PRN heart rate > 115 or SBP > 160 mmhg        [place]on[place]wo U Ogan 04/07/2020, 11:27 PM

## 2020-04-07 NOTE — Progress Notes (Signed)
NAME:  Peter [name], MRN:  [id number], DOB:  November 15, 1980, LOS: 3 ADMISSION DATE:  04/04/2020, CONSULTATION DATE:  2/13 REFERRING MD:  EDP, CHIEF COMPLAINT:  abd pain/ hbp   Brief History:  39 yowm with HBP on clonidine/BB and smoker/ heavy etoh (1 bottle whiskey a day x weeks PTA) with last drink pm 2/12 admitted with abd pain ? DT's with EDP requesting admit to ICU for precedex so consulted PCCM am 2/13   History of Present Illness:  40 y.o. male with a hx of alcohol abuse, hypertension, pancreatitis, depression, and anxiety who presents to the ED with complaints of abdominal pain onset 3-4 days PTA. Patient states pain is located primarily in the epigastrium, it is constant, severe, no alleviating/aggravating factors. Has had associated nausea without vomiting. Feels like his prior pancreatitis. He is also having alcohol withdrawal, drinks a fifth of liquor daily, last drink was @ 1900 2/12  having anxiety, shakiness, and abdominal pain in this regard. He would like to stop drinking. He has hx of withdrawal, no hx of prior withdrawal seizures, states he maybe had mild DTs before.  He denies hematemesis, vomiting, diarrhea, melena, hematochezia, syncope, dysuria, seizure, hallucinations, or dyspnea.  Past Medical History:  Alcoholism Hypertension on clonidine pre admit Substance abuse   Significant Hospital Events:    Consults:  PCCM  Procedures:    Significant Diagnostic Tests:  Abd CT 2/13 1. Acute Pancreatitis. Upper abdominal inflammation with small volume free fluid in the abdomen, pelvic inlet. No pancreatic necrosis. No organized or drainable fluid collection Attenuated splenic vein, but no definite vascular thrombosis at this time. 2. Dilated urinary bladder, estimated at 780 mL. 3. Severe hepatic steatosis.  Micro Data:  Resp viral panel  2/13 - negative  Antimicrobials:  n/a  Interim History / Subjective:  No acute events overnight. Patient complaining of  some abdominal discomfort. Denies nausea or vomiting. He is able to clearly tell me where he is this morning and why he is in the hospital.   He has oxygen desaturations while sleeping. Otherwise no oxygen desaturations while awake.   Objective   Blood pressure (!) 159/95, pulse 89, temperature 98 F (36.7 C), temperature source Axillary, resp. rate (!) 25, height 5\' 10"  (1.778 m), weight 103.4 kg, SpO2 96 %.        Intake/Output Summary (Last 24 hours) at 04/07/2020 0819 Last data filed at 04/07/2020 0653 Gross per 24 hour  Intake 1631.64 ml  Output 2400 ml  Net -768.36 ml   Filed Weights   04/04/20 0004  Weight: 103.4 kg    Examination: General: young male, resting comfortably in bed HENT: [place]/AT, PERRL, moist mucous membranes Lungs: no wheezing, clear to auscultation. No rales Cardiovascular: RRR, no murmurs Abdomen: soft, non-tender, moderately distended, BS+ Extremities: warm, no edema Neuro: A&O x3, moving all extremities GU: foley in place    Resolved Hospital Problem list   AG Acidosis Lactic Acidosis   Assessment & Plan:  Pancreatitis/ Severe sepsis  - IV fluid boluses as needed - Tolerating PO diet, advance to regular diet today - pain is controlled with current regimen of PRN dilaudid and PO oxycodone   Alcohol Abuse with withdrawal - last drink was 1900 2/12  - continue librium 15mg  TID - continue CIWA protocol for PRN IV ativan pushes - Wean off precedex today  Acute Kidney Injury, non-oliguric Hyperkalemia Hyponatremia - resolving AKI, Cr yesterday 1.05 which is nearing his baseline  Acute Hypoxemic Respiratory Failre LLL Pneumonia Sleep  Related Hypoxemia - Concern for underlying sleep apnea, will need outpatient sleep study - Dyspnea is improved, he does not require oxygen while awake. - Ceftriaxone started yesterday for concern of pneumonia, possibly aspiration. CXR with LLL opacity, small effusion noted on bedside US.   - Continue duonebs  scheduled and PRN albuterol nebs - Wean oxygen as able   Hypertension - continue home clonidine - continue amlodipine   Best practice (evaluated daily)  Diet: regular Pain/Anxiety/Delirium protocol (if indicated): benzo's per protocol, PRN dilaudid VAP protocol (if indicated): n/a DVT prophylaxis: lovenox GI prophylaxis: ppi Glucose control: ssi Mobility: bed rest Disposition: ICU Code status: full code    Labs   CBC: Recent Labs  Lab 04/04/20 0048 04/05/20 0334 04/06/20 0750  WBC 24.8* 25.8* 15.8*  NEUTROABS 22.5*  --   --   HGB 17.1* 15.5 12.5*  HCT 49.3 46.1 36.5*  MCV 93.4 96.4 95.3  PLT 204 152 117*    Basic Metabolic Panel: Recent Labs  Lab 04/04/20 0048 04/04/20 1155 04/05/20 0334 04/05/20 1139 04/06/20 0750  NA 133*  --  130* 131* 133*  K 3.7  --  6.2* 5.4* 4.4  CL 95*  --  97* 97* 100  CO2 15*  --  21* 24 23  GLUCOSE 277*  --  184* 175* 145*  BUN 9  --  18 24* 25*  CREATININE 0.94  --  0.96 1.43* 1.05  CALCIUM 8.8*  --  8.2* 8.0* 7.9*  MG  --  1.4* 2.0  --   --   PHOS  --  3.0 2.2*  --   --    GFR: Estimated Creatinine Clearance: 113.8 mL/min (by C-G formula based on SCr of 1.05 mg/dL). Recent Labs  Lab 04/04/20 0048 04/04/20 0313 04/04/20 1452 04/04/20 1820 04/05/20 0334 04/05/20 1139 04/05/20 1432 04/06/20 0750  WBC 24.8*  --   --   --  25.8*  --   --  15.8*  LATICACIDVEN  --    < > 6.6* 5.6*  --  2.6* 2.5*  --    < > = values in this interval not displayed.    Liver Function Tests: Recent Labs  Lab 04/04/20 0048 04/05/20 0334 04/06/20 0750  AST 59* 69* 28  ALT 73* 59* 32  ALKPHOS 67 48 51  BILITOT 0.8 3.3* 1.3*  PROT 8.0 6.3* 6.0*  ALBUMIN 4.2 3.1* 2.6*   Recent Labs  Lab 04/04/20 0048 04/05/20 0334  LIPASE 1,076* 200*   No results for input(s): AMMONIA in the last 168 hours.  ABG No results found for: PHART, PCO2ART, PO2ART, HCO3, TCO2, ACIDBASEDEF, O2SAT   Coagulation Profile: Recent Labs  Lab 04/04/20 0602   INR 1.1    Cardiac Enzymes: No results for input(s): CKTOTAL, CKMB, CKMBINDEX, TROPONINI in the last 168 hours.  HbA1C: Hemoglobin A1C  Date/Time Value Ref Range Status  01/28/2019 03:21 PM 5.7 (A) 4.0 - 5.6 % Final   Hgb A1c MFr Bld  Date/Time Value Ref Range Status  04/04/2020 08:27 AM 6.6 (H) 4.8 - 5.6 % Final    Comment:    (NOTE) Pre diabetes:          5.7%-6.4%  Diabetes:              >6.4%  Glycemic control for   <7.0% adults with diabetes     CBG: Recent Labs  Lab 04/06/20 2017 04/07/20 0015 04/07/20 0341 04/07/20 0428 04/07/20 0757  GLUCAP 127* 123* 121* 118* 121*  CC Time: 35 minutes   Melody Comas, MD [place] Office: [phone]   See Amion for Pager Details    After 7:00 pm call Elink  [phone]

## 2020-04-08 ENCOUNTER — Inpatient Hospital Stay (HOSPITAL_COMMUNITY): Payer: Self-pay

## 2020-04-08 DIAGNOSIS — R451 Restlessness and agitation: Secondary | ICD-10-CM

## 2020-04-08 DIAGNOSIS — G9341 Metabolic encephalopathy: Secondary | ICD-10-CM

## 2020-04-08 DIAGNOSIS — D696 Thrombocytopenia, unspecified: Secondary | ICD-10-CM

## 2020-04-08 DIAGNOSIS — J690 Pneumonitis due to inhalation of food and vomit: Secondary | ICD-10-CM

## 2020-04-08 DIAGNOSIS — A419 Sepsis, unspecified organism: Principal | ICD-10-CM

## 2020-04-08 DIAGNOSIS — R6520 Severe sepsis without septic shock: Secondary | ICD-10-CM

## 2020-04-08 DIAGNOSIS — D72829 Elevated white blood cell count, unspecified: Secondary | ICD-10-CM

## 2020-04-08 DIAGNOSIS — J9601 Acute respiratory failure with hypoxia: Secondary | ICD-10-CM

## 2020-04-08 LAB — GLUCOSE, CAPILLARY
Glucose-Capillary: 157 mg/dL — ABNORMAL HIGH (ref 70–99)
Glucose-Capillary: 173 mg/dL — ABNORMAL HIGH (ref 70–99)
Glucose-Capillary: 162 mg/dL — ABNORMAL HIGH (ref 70–99)

## 2020-04-08 LAB — COMPREHENSIVE METABOLIC PANEL
ALT: 26 U/L (ref 0–44)
AST: 28 U/L (ref 15–41)
Albumin: 3 g/dL — ABNORMAL LOW (ref 3.5–5.0)
Alkaline Phosphatase: 88 U/L (ref 38–126)
Anion gap: 13 (ref 5–15)
BUN: 12 mg/dL (ref 6–20)
CO2: 25 mmol/L (ref 22–32)
Calcium: 8.5 mg/dL — ABNORMAL LOW (ref 8.9–10.3)
Chloride: 98 mmol/L (ref 98–111)
Creatinine, Ser: 0.79 mg/dL (ref 0.61–1.24)
GFR, Estimated: >60 mL/min (ref >60)
Glucose, Bld: 190 mg/dL — ABNORMAL HIGH (ref 70–99)
Potassium: 3.8 mmol/L (ref 3.5–5.1)
Sodium: 136 mmol/L (ref 135–145)
Total Bilirubin: 1.4 mg/dL — ABNORMAL HIGH (ref 0.3–1.2)
Total Protein: 7.4 g/dL (ref 6.5–8.1)

## 2020-04-08 LAB — PHOSPHORUS: Phosphorus: 3.4 mg/dL (ref 2.5–4.6)

## 2020-04-08 LAB — LIPASE, BLOOD: Lipase: 35 U/L (ref 11–51)

## 2020-04-08 LAB — LACTIC ACID, PLASMA
Lactic Acid, Venous: 1 mmol/L (ref 0.5–1.9)
Lactic Acid, Venous: 0.8 mmol/L (ref 0.5–1.9)

## 2020-04-08 LAB — CBC
HCT: 38.6 % — ABNORMAL LOW (ref 39.0–52.0)
Hemoglobin: 12.9 g/dL — ABNORMAL LOW (ref 13.0–17.0)
MCH: 32.4 pg (ref 26.0–34.0)
MCHC: 33.4 g/dL (ref 30.0–36.0)
MCV: 97 fL (ref 80.0–100.0)
Platelets: 147 10*3/uL — ABNORMAL LOW (ref 150–400)
RBC: 3.98 MIL/uL — ABNORMAL LOW (ref 4.22–5.81)
RDW: 14.6 % (ref 11.5–15.5)
WBC: 20.9 10*3/uL — ABNORMAL HIGH (ref 4.0–10.5)
nRBC: 0 % (ref 0.0–0.2)

## 2020-04-08 LAB — MAGNESIUM: Magnesium: 1.9 mg/dL (ref 1.7–2.4)

## 2020-04-08 IMAGING — DX DG ABD PORTABLE 1V
2 series · 2 of 2 positions shown · non-contrast
Comparison: CT [DATE]

CLINICAL DATA: Generalized pain

EXAM:
PORTABLE ABDOMEN - 1 VIEW

[abdomen kub (1 of 2)]
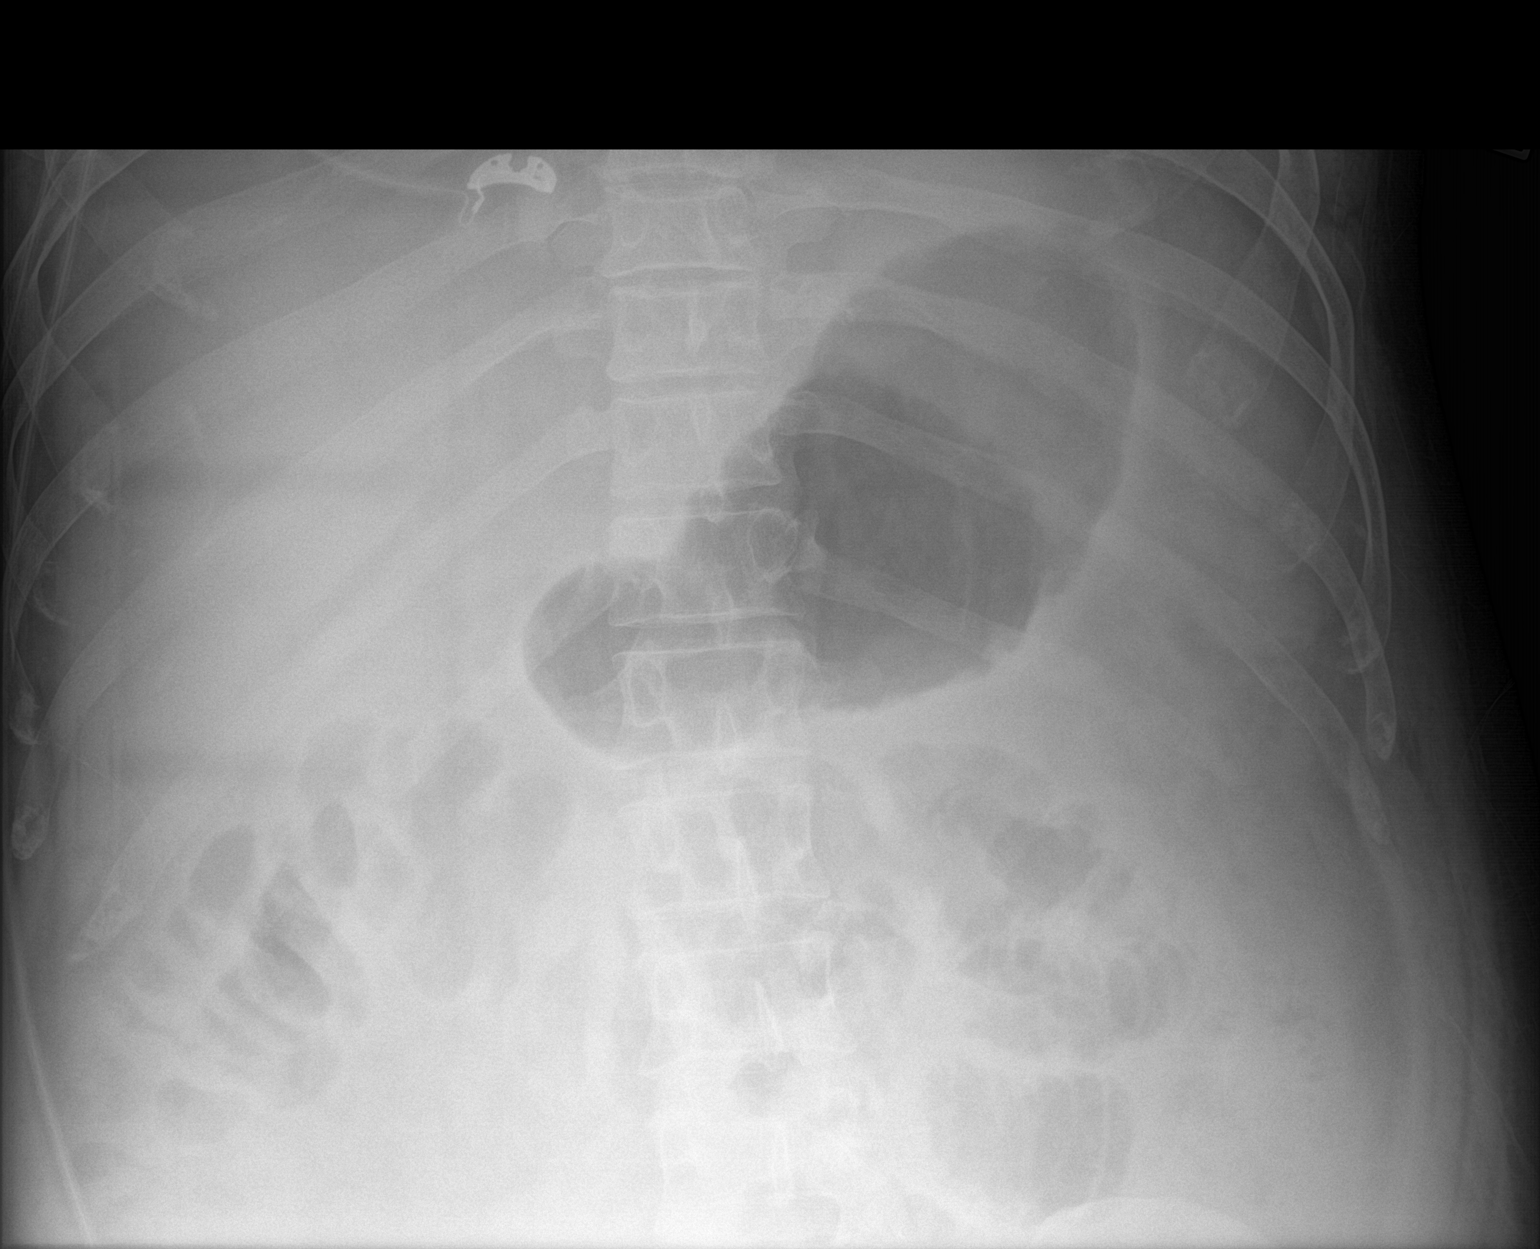

[abdomen kub (2 of 2)]
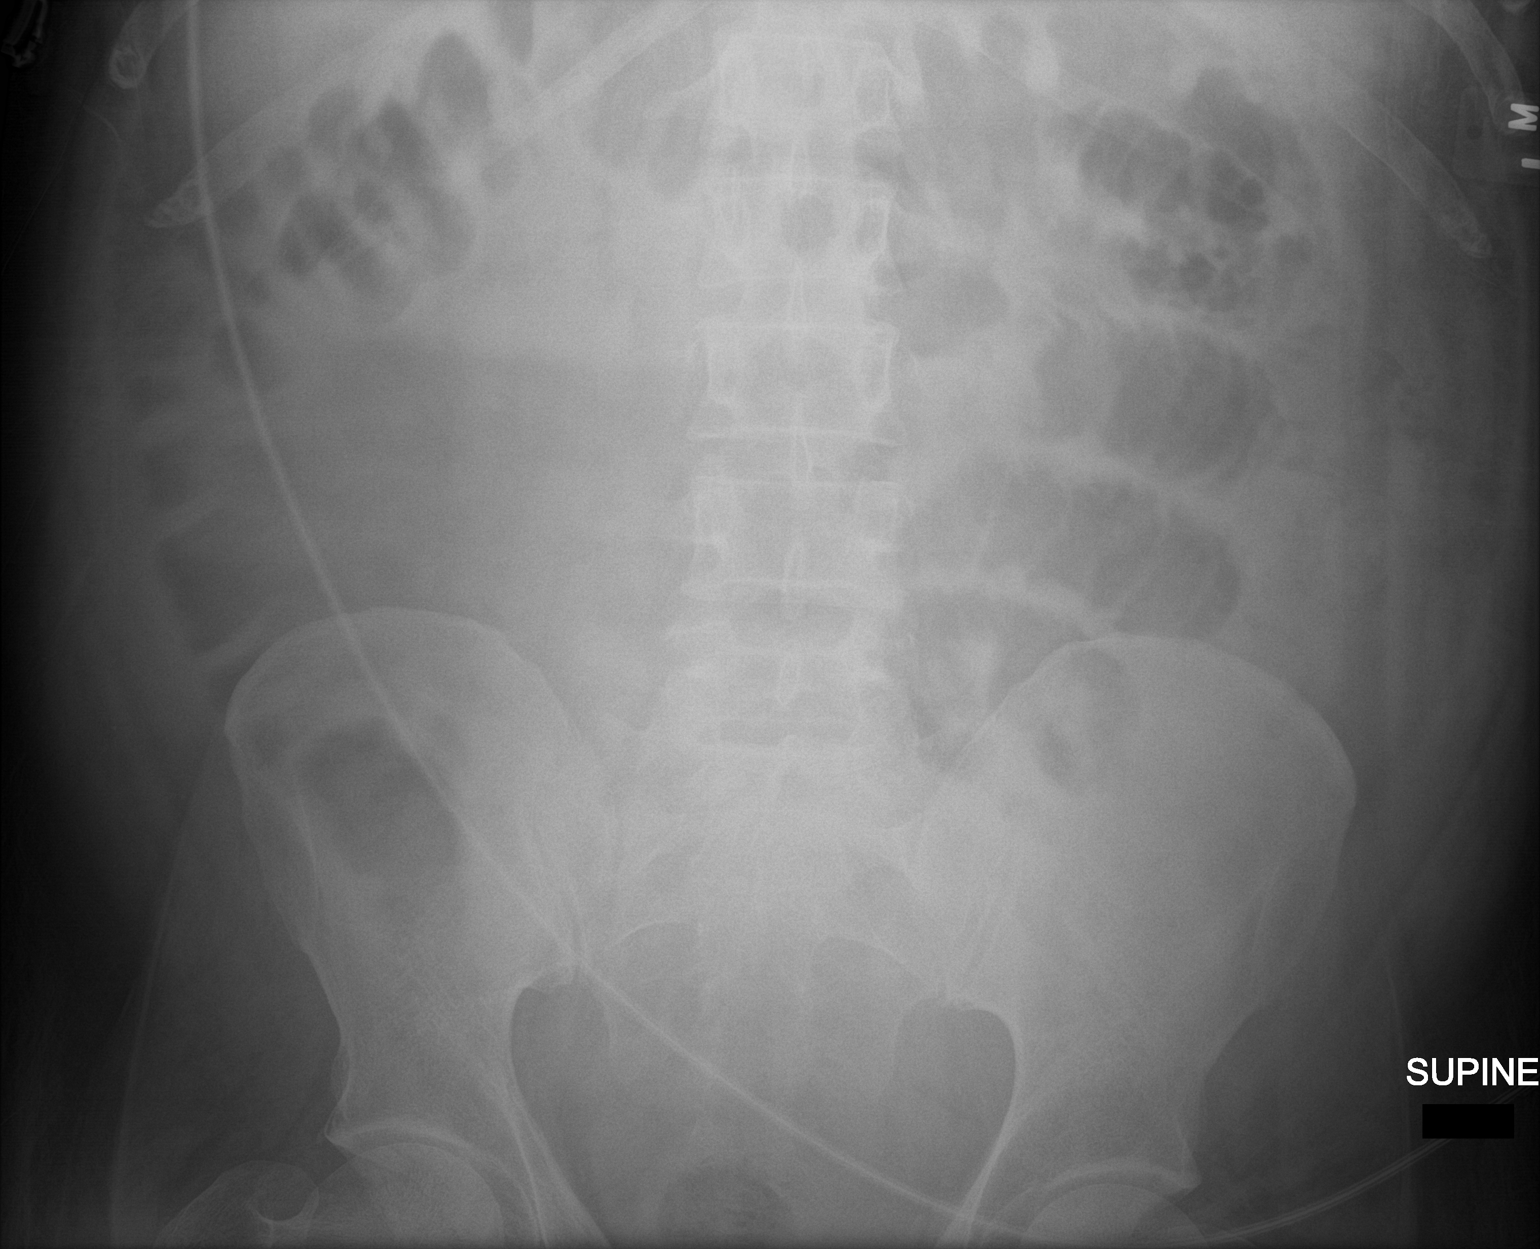

[2 of 2 positions shown; findings below may reference images not displayed]

FINDINGS: Gaseous dilatation of left lower quadrant small bowel measuring up
to 3.5 cm with scattered colon gas. No radiopaque calculi.
IMPRESSION: Mild gaseous dilatation of left lower quadrant small bowel with gas
present in the colon, suggestive of ileus.

## 2020-04-08 MED ORDER — POLYETHYLENE GLYCOL 3350 17 G PO PACK: 17.0000 g | PACK | Freq: Two times a day (BID) | ORAL | Status: DC | PRN

## 2020-04-08 MED ORDER — METOPROLOL TARTRATE 25 MG PO TABS: 25.0000 mg | ORAL_TABLET | Freq: Two times a day (BID) | ORAL | Status: DC

## 2020-04-08 MED ORDER — POTASSIUM CHLORIDE IN NACL 20-0.9 MEQ/L-% IV SOLN
INTRAVENOUS | Status: DC
  Filled 2020-04-08 (×2): qty 1000

## 2020-04-08 MED ORDER — HYDROMORPHONE HCL 1 MG/ML IJ SOLN
1.0000 mg | INTRAMUSCULAR | Status: DC | PRN
  Administered 2020-04-08: 1 mg via INTRAVENOUS
  Filled 2020-04-08: qty 1

## 2020-04-08 MED ORDER — HYDROCODONE-ACETAMINOPHEN 5-325 MG PO TABS
1.0000 | ORAL_TABLET | ORAL | Status: DC | PRN
Start: 2020-04-08 — End: 2020-04-09
  Administered 2020-04-09: 1 via ORAL
  Filled 2020-04-08: qty 1

## 2020-04-08 MED ORDER — HYDROMORPHONE HCL 1 MG/ML IJ SOLN
0.5000 mg | INTRAMUSCULAR | Status: DC | PRN
  Administered 2020-04-08 – 2020-04-10 (×10): 0.5 mg via INTRAVENOUS
  Filled 2020-04-08 (×3): qty 1
  Filled 2020-04-08 (×3): qty 0.5
  Filled 2020-04-08: qty 1
  Filled 2020-04-08 (×2): qty 0.5
  Filled 2020-04-08: qty 1

## 2020-04-08 MED ORDER — IPRATROPIUM-ALBUTEROL 0.5-2.5 (3) MG/3ML IN SOLN
3.0000 mL | Freq: Two times a day (BID) | RESPIRATORY_TRACT | Status: DC
  Administered 2020-04-08 – 2020-04-09 (×3): 3 mL via RESPIRATORY_TRACT
  Filled 2020-04-08 (×2): qty 3

## 2020-04-08 MED ORDER — CARVEDILOL 6.25 MG PO TABS
6.2500 mg | ORAL_TABLET | Freq: Two times a day (BID) | ORAL | Status: DC
  Administered 2020-04-08 – 2020-04-15 (×15): 6.25 mg via ORAL
  Filled 2020-04-08 (×15): qty 1

## 2020-04-08 MED ORDER — SODIUM CHLORIDE 0.9 % IV SOLN: INTRAVENOUS | Status: DC

## 2020-04-08 MED ORDER — LABETALOL HCL 5 MG/ML IV SOLN
10.0000 mg | INTRAVENOUS | Status: DC | PRN
  Administered 2020-04-08: 20 mg via INTRAVENOUS
  Filled 2020-04-08 (×2): qty 4

## 2020-04-08 MED ORDER — HYDROMORPHONE HCL 1 MG/ML IJ SOLN
0.5000 mg | INTRAMUSCULAR | Status: DC | PRN
  Administered 2020-04-08: 0.5 mg via INTRAVENOUS
  Filled 2020-04-08: qty 1

## 2020-04-08 MED ORDER — LORAZEPAM 2 MG/ML IJ SOLN
0.0000 mg | Freq: Three times a day (TID) | INTRAMUSCULAR | Status: DC
  Administered 2020-04-08: 2 mg via INTRAVENOUS
  Administered 2020-04-09: 1 mg via INTRAVENOUS
  Administered 2020-04-09 (×2): 2 mg via INTRAVENOUS
  Administered 2020-04-10: 1 mg via INTRAVENOUS
  Filled 2020-04-08 (×5): qty 1

## 2020-04-08 NOTE — Progress Notes (Addendum)
eLink Physician-Brief Progress Note Patient Name: Peter [name] DOB: 18-Oct-1980 MRN: [id number]   Date of Service  04/08/2020  HPI/Events of Note  Hypertension which is sub-optimally controlled with PRN Labetalol. Patient is restless raising concerns about falling out of bed.  eICU Interventions  Lopressor increased to 25 mg po and PRN Labetalol ordered iv. Safety sitter ordered.        Migdalia Dk 04/08/2020, 6:44 AM

## 2020-04-08 NOTE — Progress Notes (Signed)
Notified MD of acute onset of sharp abd pain. KUB ordered, labs ordered, and pain medication dose increased

## 2020-04-08 NOTE — Progress Notes (Signed)
PROGRESS NOTE  Peter StairsBrandon [name] ZOX:[id number] DOB: [birth year]   PCP: Kallie LocksStroud, Natalie M, FNP  Patient is from: Home  DOA: 04/04/2020 LOS: 4  Chief complaints: Abdominal pain  Brief Narrative / Interim history: 40 year old M with PMH of alcohol abuse, HTN, carditis, depression and anxiety presenting with 3 to 4 days of abdominal pain and nausea and admitted to ICU for alcohol withdrawal and severe sepsis due to possible aspiration pneumonia.  He was treated with Precedex and IV antibiotics.  Withdrawal symptoms improved, and [facility]  Subjective: Seen and examined earlier this morning.  He had an episode of urinary retention requiring indwelling Foley catheter.  Also markedly elevated BP and heart rate.  Also restless requiring one-to-one Recruitment consultantsafety sitter.  This morning, he complains of upper abdominal.  Nausea but no emesis.  He denies chest pain or dyspnea.  Reports some dry cough, audiovisual hallucination.   Objective: Vitals:   04/08/20 0823 04/08/20 0902 04/08/20 1000 04/08/20 1200  BP:   (!) 157/92   Pulse:   (!) 105   Resp:   13   Temp:    98.2 F (36.8 C)  TempSrc:    Oral  SpO2: 98% 95% 97%   Weight:      Height:        Intake/Output Summary (Last 24 hours) at 04/08/2020 1323 Last data filed at 04/08/2020 0800 Gross per 24 hour  Intake 824.41 ml  Output 1600 ml  Net -775.59 ml   Filed Weights   04/04/20 0004  Weight: 103.4 kg    Examination:  GENERAL: No apparent distress.  Nontoxic. HEENT: MMM.  Vision and hearing grossly intact.  NECK: Supple.  No apparent JVD.  RESP:  No IWOB.  Fair aeration bilaterally. CVS: Slightly tachycardic.Marland Kitchen. Heart sounds normal.  ABD/GI/GU: BS+. Abd soft, NTND.  MSK/EXT:  Moves extremities. No apparent deformity. No edema.  SKIN: no apparent skin lesion or wound NEURO: Awake, alert and oriented appropriately.  No apparent focal neuro deficit. PSYCH: Calm. Normal affect.  Procedures:   None  Microbiology summarized: Influenza and COVID-19 PCR nonreactive.  Assessment & Plan: Acute pancreatitis-likely due to alcohol. Alcohol abuse with withdrawal-still with tachycardia and elevated BPs.   Encephalopathy/agitation: restless overnight requiring one-to-one safety sitter. -Lipase normalized.  Still with some pain but improved. -Continue regular diet and pain control -Continue Librium taper with as needed IV Ativan push -Continue multivitamin, folic acid and thiamine. -TOC consult for resources and counseling  Acute respiratory failure with hypoxia Severe sepsis due to aspiration pneumonia Possible LLL aspiration pneumonia Sleep Related Hypoxemia -Currently on 6 L by nasal cannula.  Wean oxygen as able -Continue IV ceftriaxone -Continue as needed duonebs -PCCM to arrange outpatient sleep study but wants to be notified prior to discharge  Uncontrolled hypertension/sinus tachycardia-due to alcohol withdrawal?.  He could have underlying undiagnosed sleep apnea. -Continue home amlodipine. -Continue clonidine patch.  From p.o. clonidine -Change metoprolol to Coreg for better blood pressure control -May resume home losartan if BP remains elevated -Outpatient sleep study as above  Acute Kidney Injury/hyperkalemia/hyponatremia-old due to alcohol.  Resolved. -Encourage oral intake  Anxiety/depression -Continue home Effexor and BuSpar.  Leukocytosis: Could be due to sepsis.  Could also be due to demargination. -Continue monitoring  Thrombocytopenia: Likely from alcohol. -Continue monitoring  Body mass index is 32.71 kg/m.         DVT prophylaxis:  enoxaparin (LOVENOX) injection 40 mg Start: 04/04/20 1200  Code Status: Full code Family  Communication: Patient and/or RN. Available if any question.  Level of care: Stepdown Status is: Inpatient  Remains inpatient appropriate because:Hemodynamically unstable, Altered mental status, Unsafe d/c plan, IV  treatments appropriate due to intensity of illness or inability to take PO and Inpatient level of care appropriate due to severity of illness   Dispo: The patient is from: Home              Anticipated d/c is to: Home              Anticipated d/c date is: 2 days              Patient currently is not medically stable to d/c.   Difficult to place patient No       Consultants:  PCCM-signed off   Sch Meds:  Scheduled Meds: . amLODipine  10 mg Oral Daily  . busPIRone  7.5 mg Oral TID  . carvedilol  6.25 mg Oral BID WC  . chlordiazePOXIDE  15 mg Oral TID  . chlorhexidine  15 mL Mouth Rinse BID  . Chlorhexidine Gluconate Cloth  6 each Topical Daily  . cloNIDine  0.2 mg Transdermal Weekly  . enoxaparin (LOVENOX) injection  40 mg Subcutaneous Q24H  . folic acid  1 mg Oral Daily  . ipratropium-albuterol  3 mL Nebulization BID  . LORazepam  0-4 mg Intravenous Q8H  . mouth rinse  15 mL Mouth Rinse q12n4p  . mouth rinse  15 mL Mouth Rinse BID  . multivitamin with minerals  1 tablet Oral Daily  . nicotine  14 mg Transdermal Daily  . pantoprazole  40 mg Oral BID  . sodium chloride flush  3 mL Intravenous Q12H  . thiamine  100 mg Oral Daily   Or  . thiamine  100 mg Intravenous Daily  . venlafaxine XR  75 mg Oral Q breakfast   Continuous Infusions: . sodium chloride 100 mL/hr at 04/08/20 1210  . cefTRIAXone (ROCEPHIN)  IV Stopped (04/07/20 1353)   PRN Meds:.acetaminophen **OR** acetaminophen, albuterol, HYDROcodone-acetaminophen, HYDROmorphone (DILAUDID) injection, labetalol, ondansetron **OR** ondansetron (ZOFRAN) IV  Antimicrobials: Anti-infectives (From admission, onward)   Start     Dose/Rate Route Frequency Ordered Stop   04/06/20 1300  cefTRIAXone (ROCEPHIN) 2 g in sodium chloride 0.9 % 100 mL IVPB        2 g 200 mL/hr over 30 Minutes Intravenous Every 24 hours 04/06/20 1202         I have personally reviewed the following labs and images: CBC: Recent Labs  Lab  04/04/20 0048 04/05/20 0334 04/06/20 0750 04/07/20 1034 04/08/20 0533  WBC 24.8* 25.8* 15.8* 19.4* 20.9*  NEUTROABS 22.5*  --   --   --   --   HGB 17.1* 15.5 12.5* 12.8* 12.9*  HCT 49.3 46.1 36.5* 37.8* 38.6*  MCV 93.4 96.4 95.3 96.7 97.0  PLT 204 152 117* 145* 147*   BMP &GFR Recent Labs  Lab 04/04/20 1155 04/05/20 0334 04/05/20 1139 04/06/20 0750 04/07/20 1034 04/08/20 0533  NA  --  130* 131* 133* 136 136  K  --  6.2* 5.4* 4.4 3.8 3.8  CL  --  97* 97* 100 99 98  CO2  --  21* 24 23 22 25   GLUCOSE  --  184* 175* 145* 157* 190*  BUN  --  18 24* 25* 18 12  CREATININE  --  0.96 1.43* 1.05 1.01 0.79  CALCIUM  --  8.2* 8.0* 7.9* 8.6* 8.5*  MG  1.4* 2.0  --   --   --  1.9  PHOS 3.0 2.2*  --   --   --  3.4   Estimated Creatinine Clearance: 149.4 mL/min (by C-G formula based on SCr of 0.79 mg/dL). Liver & Pancreas: Recent Labs  Lab 04/04/20 0048 04/05/20 0334 04/06/20 0750 04/07/20 1034 04/08/20 0533  AST 59* 69* 28 24 28   ALT 73* 59* 32 26 26  ALKPHOS 67 48 51 73 88  BILITOT 0.8 3.3* 1.3* 1.5* 1.4*  PROT 8.0 6.3* 6.0* 7.2 7.4  ALBUMIN 4.2 3.1* 2.6* 2.9* 3.0*   Recent Labs  Lab 04/04/20 0048 04/05/20 0334 04/07/20 1034  LIPASE 1,076* 200* 31   No results for input(s): AMMONIA in the last 168 hours. Diabetic: No results for input(s): HGBA1C in the last 72 hours. Recent Labs  Lab 04/07/20 1600 04/07/20 1949 04/08/20 0358 04/08/20 0811 04/08/20 1218  GLUCAP 126* 165* 157* 173* 162*   Cardiac Enzymes: No results for input(s): CKTOTAL, CKMB, CKMBINDEX, TROPONINI in the last 168 hours. No results for input(s): PROBNP in the last 8760 hours. Coagulation Profile: Recent Labs  Lab 04/04/20 0602  INR 1.1   Thyroid Function Tests: No results for input(s): TSH, T4TOTAL, FREET4, T3FREE, THYROIDAB in the last 72 hours. Lipid Profile: No results for input(s): CHOL, HDL, LDLCALC, TRIG, CHOLHDL, LDLDIRECT in the last 72 hours. Anemia Panel: No results for  input(s): VITAMINB12, FOLATE, FERRITIN, TIBC, IRON, RETICCTPCT in the last 72 hours. Urine analysis:    Component Value Date/Time   COLORURINE AMBER (A) 04/04/2020 2354   APPEARANCEUR CLEAR 04/04/2020 2354   LABSPEC 1.025 04/04/2020 2354   PHURINE 5.0 04/04/2020 2354   GLUCOSEU 50 (A) 04/04/2020 2354   HGBUR SMALL (A) 04/04/2020 2354   BILIRUBINUR NEGATIVE 04/04/2020 2354   BILIRUBINUR Negative 01/28/2019 1521   KETONESUR 5 (A) 04/04/2020 2354   PROTEINUR 100 (A) 04/04/2020 2354   UROBILINOGEN 0.2 01/28/2019 1521   UROBILINOGEN 0.2 11/13/2016 1234   NITRITE NEGATIVE 04/04/2020 2354   LEUKOCYTESUR NEGATIVE 04/04/2020 2354   Sepsis Labs: Invalid input(s): PROCALCITONIN, LACTICIDVEN  Microbiology: Recent Results (from the past 240 hour(s))  Resp Panel by RT-PCR (Flu A&B, Covid) Nasopharyngeal Swab     Status: None   Collection Time: 04/04/20  6:49 AM   Specimen: Nasopharyngeal Swab; Nasopharyngeal(NP) swabs in vial transport medium  Result Value Ref Range Status   SARS Coronavirus 2 by RT PCR NEGATIVE NEGATIVE Final    Comment: (NOTE) SARS-CoV-2 target nucleic acids are NOT DETECTED.  The SARS-CoV-2 RNA is generally detectable in upper respiratory specimens during the acute phase of infection. The lowest concentration of SARS-CoV-2 viral copies this assay can detect is 138 copies/mL. A negative result does not preclude SARS-Cov-2 infection and should not be used as the sole basis for treatment or other patient management decisions. A negative result may occur with  improper specimen collection/handling, submission of specimen other than nasopharyngeal swab, presence of viral mutation(s) within the areas targeted by this assay, and inadequate number of viral copies(<138 copies/mL). A negative result must be combined with clinical observations, patient history, and epidemiological information. The expected result is Negative.  Fact Sheet for Patients:   04/06/20  Fact Sheet for Healthcare Providers:  BloggerCourse.com  This test is no t yet approved or cleared by the [place] FDA and  has been authorized for detection and/or diagnosis of SARS-CoV-2 by FDA under an Emergency Use Authorization (EUA). This EUA will remain  in effect (meaning this  test can be used) for the duration of the COVID-19 declaration under Section 564(b)(1) of the Act, 21 U.S.C.section 360bbb-3(b)(1), unless the authorization is terminated  or revoked sooner.       Influenza A by PCR NEGATIVE NEGATIVE Final   Influenza B by PCR NEGATIVE NEGATIVE Final    Comment: (NOTE) The Xpert Xpress SARS-CoV-2/FLU/RSV plus assay is intended as an aid in the diagnosis of influenza from Nasopharyngeal swab specimens and should not be used as a sole basis for treatment. Nasal washings and aspirates are unacceptable for Xpert Xpress SARS-CoV-2/FLU/RSV testing.  Fact Sheet for Patients: BloggerCourse.com  Fact Sheet for Healthcare Providers: SeriousBroker.it  This test is not yet approved or cleared by the [place] FDA and has been authorized for detection and/or diagnosis of SARS-CoV-2 by FDA under an Emergency Use Authorization (EUA). This EUA will remain in effect (meaning this test can be used) for the duration of the COVID-19 declaration under Section 564(b)(1) of the Act, 21 U.S.C. section 360bbb-3(b)(1), unless the authorization is terminated or revoked.  Performed at [facility], [address], [place], [place] [address]   MRSA PCR Screening     Status: None   Collection Time: 04/04/20  5:19 PM   Specimen: Nasal Mucosa; Nasopharyngeal  Result Value Ref Range Status   MRSA by PCR NEGATIVE NEGATIVE Final    Comment:        The GeneXpert MRSA Assay (FDA approved for NASAL specimens only), is one component of  a comprehensive MRSA colonization surveillance program. It is not intended to diagnose MRSA infection nor to guide or monitor treatment for MRSA infections. Performed at [facility], [address] [place], [place] [address]     Radiology Studies: No results found.     Linzy Laury T. Skyler Dusing [facility]  If 7PM-7AM, please contact night-coverage [email or web address] 04/08/2020, 1:23 PM

## 2020-04-08 NOTE — Progress Notes (Signed)
eLink Physician-Brief Progress Note Patient Name: Peter [name] DOB: [birth year] MRN: [id number]   Date of Service  04/08/2020  HPI/Events of Note  Urinary retention with > 500 ml of urine in the bladder on bladder scan.  eICU Interventions  Foley catheter ordered.        Aerie Donica U Susannah Carbin 04/08/2020, 1:01 AM

## 2020-04-08 NOTE — Progress Notes (Signed)
Notified E-link about pt's consistently high BP despite receiving IV metoprolol and pain medication. No new orders at this time.

## 2020-04-09 ENCOUNTER — Inpatient Hospital Stay (HOSPITAL_COMMUNITY): Payer: Self-pay

## 2020-04-09 DIAGNOSIS — K567 Ileus, unspecified: Secondary | ICD-10-CM

## 2020-04-09 LAB — CBC WITH DIFFERENTIAL/PLATELET
Abs Immature Granulocytes: 0.87 10*3/uL — ABNORMAL HIGH (ref 0.00–0.07)
Basophils Absolute: 0.1 10*3/uL (ref 0.0–0.1)
Basophils Relative: 1 %
Eosinophils Absolute: 0 10*3/uL (ref 0.0–0.5)
Eosinophils Relative: 0 %
HCT: 35.9 % — ABNORMAL LOW (ref 39.0–52.0)
Hemoglobin: 11.7 g/dL — ABNORMAL LOW (ref 13.0–17.0)
Immature Granulocytes: 6 %
Lymphocytes Relative: 6 %
Lymphs Abs: 0.9 10*3/uL (ref 0.7–4.0)
MCH: 32.2 pg (ref 26.0–34.0)
MCHC: 32.6 g/dL (ref 30.0–36.0)
MCV: 98.9 fL (ref 80.0–100.0)
Monocytes Absolute: 2 10*3/uL — ABNORMAL HIGH (ref 0.1–1.0)
Monocytes Relative: 13 %
Neutro Abs: 11.8 10*3/uL — ABNORMAL HIGH (ref 1.7–7.7)
Neutrophils Relative %: 74 %
Platelets: 167 10*3/uL (ref 150–400)
RBC: 3.63 MIL/uL — ABNORMAL LOW (ref 4.22–5.81)
RDW: 14.9 % (ref 11.5–15.5)
WBC: 15.8 10*3/uL — ABNORMAL HIGH (ref 4.0–10.5)
nRBC: 0.2 % (ref 0.0–0.2)

## 2020-04-09 LAB — RENAL FUNCTION PANEL
Albumin: 2.7 g/dL — ABNORMAL LOW (ref 3.5–5.0)
Anion gap: 11 (ref 5–15)
BUN: 10 mg/dL (ref 6–20)
CO2: 26 mmol/L (ref 22–32)
Calcium: 8.4 mg/dL — ABNORMAL LOW (ref 8.9–10.3)
Chloride: 100 mmol/L (ref 98–111)
Creatinine, Ser: 0.68 mg/dL (ref 0.61–1.24)
GFR, Estimated: >60 mL/min (ref >60)
Glucose, Bld: 179 mg/dL — ABNORMAL HIGH (ref 70–99)
Phosphorus: 2.3 mg/dL — ABNORMAL LOW (ref 2.5–4.6)
Potassium: 4 mmol/L (ref 3.5–5.1)
Sodium: 137 mmol/L (ref 135–145)

## 2020-04-09 LAB — MAGNESIUM: Magnesium: 2 mg/dL (ref 1.7–2.4)

## 2020-04-09 LAB — GLUCOSE, CAPILLARY
Glucose-Capillary: 170 mg/dL — ABNORMAL HIGH (ref 70–99)
Glucose-Capillary: 159 mg/dL — ABNORMAL HIGH (ref 70–99)
Glucose-Capillary: 171 mg/dL — ABNORMAL HIGH (ref 70–99)

## 2020-04-09 IMAGING — DX DG CHEST 1V PORT
1 series · 1 of 1 positions shown · non-contrast
Comparison: [DATE]

CLINICAL DATA: Cough.  Hypertension.

EXAM:
PORTABLE CHEST 1 VIEW

[chest ap]
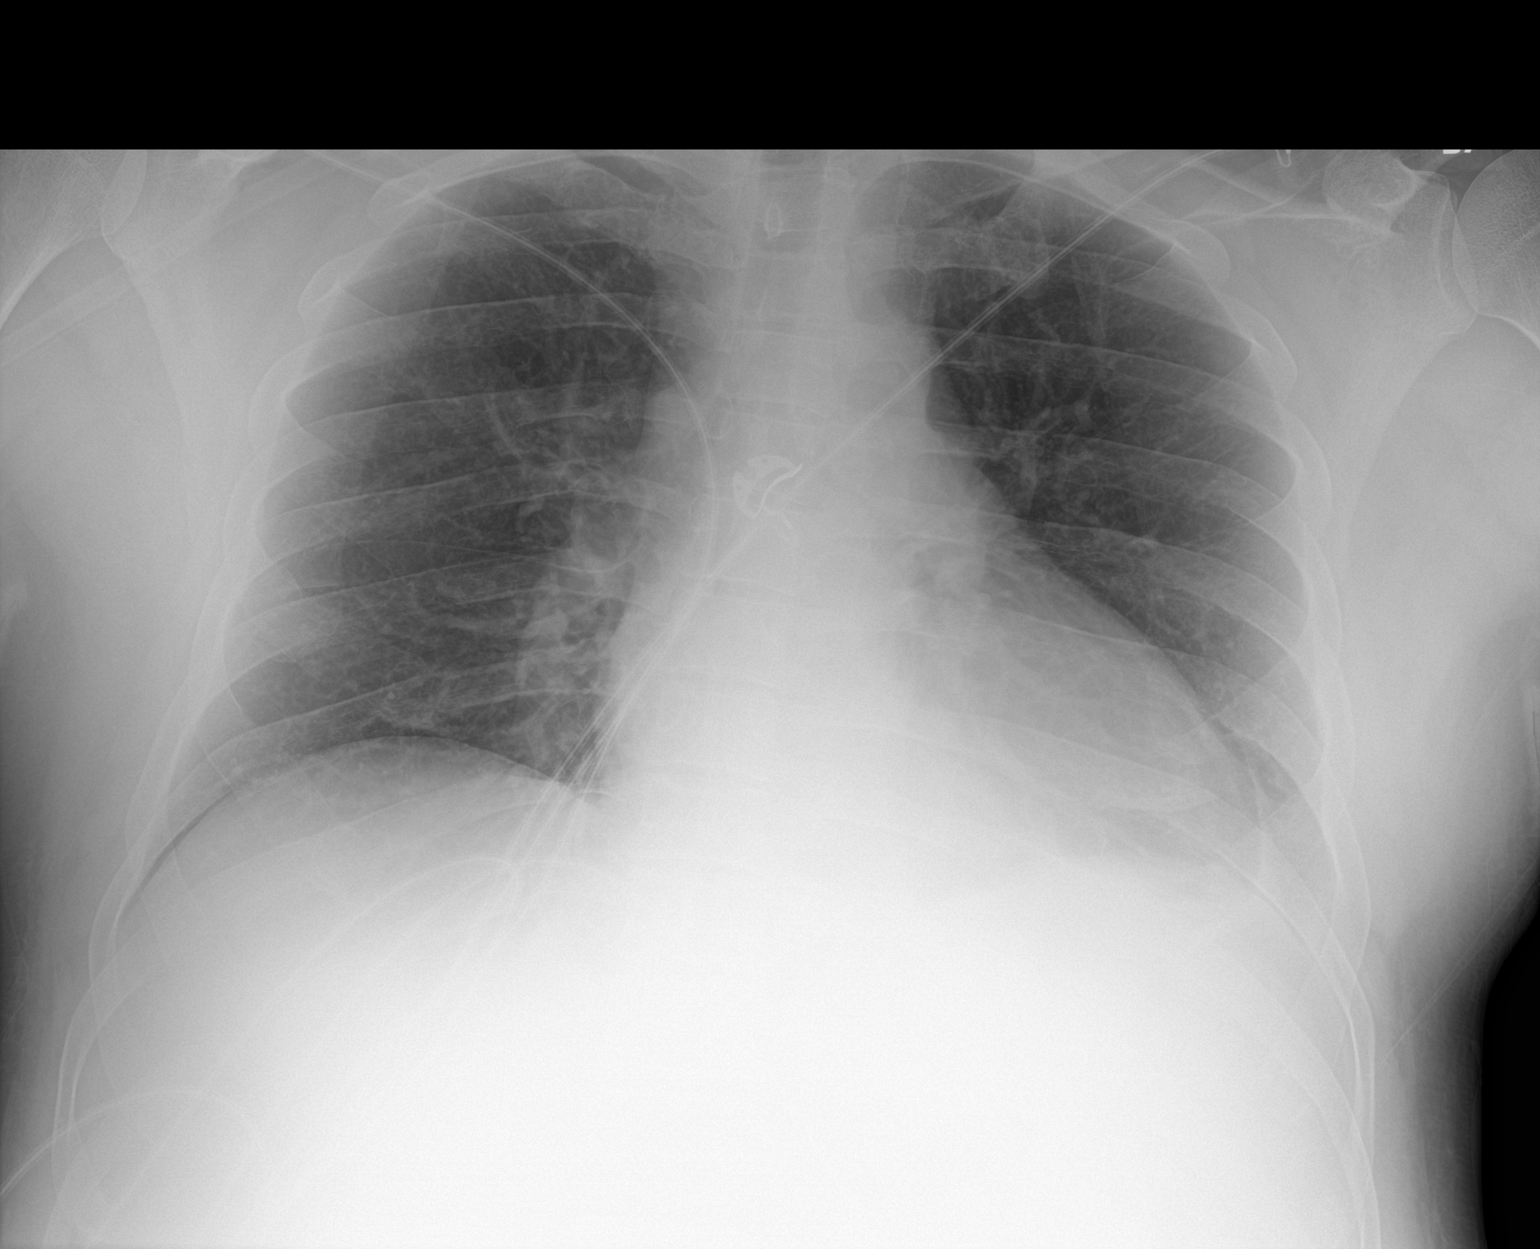

[1 of 1 positions shown; findings below may reference images not displayed]

FINDINGS: There is atelectatic change in the left base with a small left
pleural effusion. The lungs elsewhere are clear. There is
cardiomegaly, stable, with pulmonary vascularity normal. No
adenopathy. No bone lesions.
IMPRESSION: Stable cardiac prominence. Left base atelectasis with small left
pleural effusion. Lungs elsewhere clear.

## 2020-04-09 MED ORDER — CHLORDIAZEPOXIDE HCL 5 MG PO CAPS
10.0000 mg | ORAL_CAPSULE | Freq: Three times a day (TID) | ORAL | Status: DC
  Administered 2020-04-09 – 2020-04-10 (×3): 10 mg via ORAL
  Filled 2020-04-09 (×3): qty 2

## 2020-04-09 MED ORDER — ACETAMINOPHEN 500 MG PO TABS
1000.0000 mg | ORAL_TABLET | Freq: Three times a day (TID) | ORAL | Status: DC
  Administered 2020-04-09 – 2020-04-15 (×18): 1000 mg via ORAL
  Filled 2020-04-09 (×18): qty 2

## 2020-04-09 MED ORDER — OXYCODONE HCL 5 MG PO TABS
5.0000 mg | ORAL_TABLET | Freq: Four times a day (QID) | ORAL | Status: DC | PRN
  Administered 2020-04-10: 5 mg via ORAL
  Filled 2020-04-09: qty 1

## 2020-04-09 NOTE — Progress Notes (Signed)
Report called to [facility] on 6E. All questions answered.

## 2020-04-09 NOTE — Progress Notes (Signed)
PROGRESS NOTE  Peter [name] TKW:[id number] DOB: 1980/03/07   PCP: Kallie Locks, FNP  Patient is from: Home  DOA: 04/04/2020 LOS: 5  Chief complaints: Abdominal pain  Brief Narrative / Interim history: 40 year old M with PMH of alcohol abuse, HTN, carditis, depression and anxiety presenting with 3 to 4 days of abdominal pain and nausea and admitted to ICU for alcohol withdrawal and severe sepsis due to possible aspiration pneumonia.  He was treated with Precedex and IV antibiotics.  Withdrawal symptoms improved, and he was transferred to [facility] service on 04/08/2020.  Subjective: Seen and examined earlier this morning. No major events overnight of this morning. Still with some diffuse abdominal pain. He rates his pain 6/10. Reports passing gas and having bowel movements. KUB concerning for ileus. Lipase and lactic acid within normal. He denies chest pain. Saturating in upper 80s to low 90s on room air.  Objective: Vitals:   04/09/20 0606 04/09/20 0700 04/09/20 0804 04/09/20 0806  BP: (!) 153/93 (!) 142/68 (!) 137/45   Pulse: (!) 107 (!) 105 (!) 109   Resp:   19   Temp:   98.1 F (36.7 C)   TempSrc:   Oral   SpO2:   90% (!) 89%  Weight:      Height:        Intake/Output Summary (Last 24 hours) at 04/09/2020 1127 Last data filed at 04/09/2020 0400 Gross per 24 hour  Intake 1563.13 ml  Output -  Net 1563.13 ml   Filed Weights   04/04/20 0004  Weight: 103.4 kg    Examination:  GENERAL: No apparent distress.  Nontoxic. HEENT: MMM.  Vision and hearing grossly intact.  NECK: Supple.  No apparent JVD.  RESP: On RA. No IWOB. Diminished air movement over lower lung fields. CVS:  RRR. Heart sounds normal.  ABD/GI/GU: BS+. Slightly distended. Mild diffuse tenderness with deep palpation. MSK/EXT:  Moves extremities. No apparent deformity. No edema.  SKIN: no apparent skin lesion or wound NEURO: Awake, alert and oriented appropriately.  No apparent focal neuro  deficit. PSYCH: Calm. Normal affect.  Procedures:  None  Microbiology summarized: Influenza and COVID-19 PCR nonreactive.  Assessment & Plan: Acute pancreatitis-likely due to alcohol. Alcohol abuse with withdrawal-still with tachycardia and elevated BPs.   Encephalopathy/agitation: Improving. Likely from alcohol withdrawal and medications. -Lipase normalized.  Still with some pain but improved. -Resume clear liquid diet -Continue Librium taper with as needed IV Ativan push -Continue multivitamin, folic acid and thiamine. -TOC consult for resources and counseling  Acute respiratory failure with hypoxia-saturating in the upper 80s to low 90s on room air. Severe sepsis due to aspiration pneumonia Possible LLL aspiration pneumonia Sleep Related Hypoxemia -Portable chest x-ray today -Continue weaning oxygen as able -OOB/PT/OT/incentive spirometry -Continue IV ceftriaxone -Continue as needed duonebs as needed -PCCM to arrange outpatient sleep study but wants to be notified prior to discharge  Abdominal pain/ileus -Minimize opiates -OOB/PT/OT -As needed MiraLAX  Uncontrolled hypertension/sinus tachycardia-due to alcohol withdrawal?.  He could have underlying undiagnosed sleep apnea. Improved -Continue home amlodipine. -Continue clonidine patch. On p.o. clonidine at home. -Continue Coreg -May resume home losartan if BP remains elevated -Outpatient sleep study as above  Acute Kidney Injury/hyperkalemia/hyponatremia-old due to alcohol.  Resolved. -Encourage oral intake  Anxiety/depression -Continue home Effexor and BuSpar.  Leukocytosis: Could be due to sepsis.  Could also be due to demargination. Improved. -Continue monitoring  Thrombocytopenia: Likely from alcohol. Resolved. -Continue monitoring  Body mass index is 32.71 kg/m.  DVT prophylaxis:  enoxaparin (LOVENOX) injection 40 mg Start: 04/04/20 1200  Code Status: Full code Family Communication:  Patient and/or RN. Available if any question.  Level of care: Telemetry Status is: Inpatient  Remains inpatient appropriate because:Hemodynamically unstable, Altered mental status, Unsafe d/c plan, IV treatments appropriate due to intensity of illness or inability to take PO and Inpatient level of care appropriate due to severity of illness   Dispo: The patient is from: Home              Anticipated d/c is to: Home              Anticipated d/c date is: 2 days              Patient currently is not medically stable to d/c.   Difficult to place patient No       Consultants:  PCCM-signed off   Sch Meds:  Scheduled Meds: . acetaminophen  1,000 mg Oral Q8H  . amLODipine  10 mg Oral Daily  . busPIRone  7.5 mg Oral TID  . carvedilol  6.25 mg Oral BID WC  . chlordiazePOXIDE  10 mg Oral TID  . chlorhexidine  15 mL Mouth Rinse BID  . Chlorhexidine Gluconate Cloth  6 each Topical Daily  . cloNIDine  0.2 mg Transdermal Weekly  . enoxaparin (LOVENOX) injection  40 mg Subcutaneous Q24H  . folic acid  1 mg Oral Daily  . ipratropium-albuterol  3 mL Nebulization BID  . LORazepam  0-4 mg Intravenous Q8H  . mouth rinse  15 mL Mouth Rinse q12n4p  . mouth rinse  15 mL Mouth Rinse BID  . multivitamin with minerals  1 tablet Oral Daily  . nicotine  14 mg Transdermal Daily  . pantoprazole  40 mg Oral BID  . sodium chloride flush  3 mL Intravenous Q12H  . thiamine  100 mg Oral Daily   Or  . thiamine  100 mg Intravenous Daily  . venlafaxine XR  75 mg Oral Q breakfast   Continuous Infusions: . cefTRIAXone (ROCEPHIN)  IV Stopped (04/08/20 1525)   PRN Meds:.albuterol, HYDROmorphone (DILAUDID) injection, labetalol, ondansetron **OR** ondansetron (ZOFRAN) IV, oxyCODONE, polyethylene glycol  Antimicrobials: Anti-infectives (From admission, onward)   Start     Dose/Rate Route Frequency Ordered Stop   04/06/20 1300  cefTRIAXone (ROCEPHIN) 2 g in sodium chloride 0.9 % 100 mL IVPB        2 g 200  mL/hr over 30 Minutes Intravenous Every 24 hours 04/06/20 1202         I have personally reviewed the following labs and images: CBC: Recent Labs  Lab 04/04/20 0048 04/05/20 0334 04/06/20 0750 04/07/20 1034 04/08/20 0533 04/09/20 0237  WBC 24.8* 25.8* 15.8* 19.4* 20.9* 15.8*  NEUTROABS 22.5*  --   --   --   --  11.8*  HGB 17.1* 15.5 12.5* 12.8* 12.9* 11.7*  HCT 49.3 46.1 36.5* 37.8* 38.6* 35.9*  MCV 93.4 96.4 95.3 96.7 97.0 98.9  PLT 204 152 117* 145* 147* 167   BMP &GFR Recent Labs  Lab 04/04/20 1155 04/05/20 0334 04/05/20 1139 04/06/20 0750 04/07/20 1034 04/08/20 0533 04/09/20 0237  NA  --  130* 131* 133* 136 136 137  K  --  6.2* 5.4* 4.4 3.8 3.8 4.0  CL  --  97* 97* 100 99 98 100  CO2  --  21* 24 23 22 25 26   GLUCOSE  --  184* 175* 145* 157* 190* 179*  BUN  --  18 24* 25* 18 12 10   CREATININE  --  0.96 1.43* 1.05 1.01 0.79 0.68  CALCIUM  --  8.2* 8.0* 7.9* 8.6* 8.5* 8.4*  MG 1.4* 2.0  --   --   --  1.9 2.0  PHOS 3.0 2.2*  --   --   --  3.4 2.3*   Estimated Creatinine Clearance: 149.4 mL/min (by C-G formula based on SCr of 0.68 mg/dL). Liver & Pancreas: Recent Labs  Lab 04/04/20 0048 04/05/20 0334 04/06/20 0750 04/07/20 1034 04/08/20 0533 04/09/20 0237  AST 59* 69* 28 24 28   --   ALT 73* 59* 32 26 26  --   ALKPHOS 67 48 51 73 88  --   BILITOT 0.8 3.3* 1.3* 1.5* 1.4*  --   PROT 8.0 6.3* 6.0* 7.2 7.4  --   ALBUMIN 4.2 3.1* 2.6* 2.9* 3.0* 2.7*   Recent Labs  Lab 04/04/20 0048 04/05/20 0334 04/07/20 1034 04/08/20 1851  LIPASE 1,076* 200* 31 35   No results for input(s): AMMONIA in the last 168 hours. Diabetic: No results for input(s): HGBA1C in the last 72 hours. Recent Labs  Lab 04/07/20 1949 04/08/20 0358 04/08/20 0811 04/08/20 1218 04/09/20 0737  GLUCAP 165* 157* 173* 162* 170*   Cardiac Enzymes: No results for input(s): CKTOTAL, CKMB, CKMBINDEX, TROPONINI in the last 168 hours. No results for input(s): PROBNP in the last 8760  hours. Coagulation Profile: Recent Labs  Lab 04/04/20 0602  INR 1.1   Thyroid Function Tests: No results for input(s): TSH, T4TOTAL, FREET4, T3FREE, THYROIDAB in the last 72 hours. Lipid Profile: No results for input(s): CHOL, HDL, LDLCALC, TRIG, CHOLHDL, LDLDIRECT in the last 72 hours. Anemia Panel: No results for input(s): VITAMINB12, FOLATE, FERRITIN, TIBC, IRON, RETICCTPCT in the last 72 hours. Urine analysis:    Component Value Date/Time   COLORURINE AMBER (A) 04/04/2020 2354   APPEARANCEUR CLEAR 04/04/2020 2354   LABSPEC 1.025 04/04/2020 2354   PHURINE 5.0 04/04/2020 2354   GLUCOSEU 50 (A) 04/04/2020 2354   HGBUR SMALL (A) 04/04/2020 2354   BILIRUBINUR NEGATIVE 04/04/2020 2354   BILIRUBINUR Negative 01/28/2019 1521   KETONESUR 5 (A) 04/04/2020 2354   PROTEINUR 100 (A) 04/04/2020 2354   UROBILINOGEN 0.2 01/28/2019 1521   UROBILINOGEN 0.2 11/13/2016 1234   NITRITE NEGATIVE 04/04/2020 2354   LEUKOCYTESUR NEGATIVE 04/04/2020 2354   Sepsis Labs: Invalid input(s): PROCALCITONIN, LACTICIDVEN  Microbiology: Recent Results (from the past 240 hour(s))  Resp Panel by RT-PCR (Flu A&B, Covid) Nasopharyngeal Swab     Status: None   Collection Time: 04/04/20  6:49 AM   Specimen: Nasopharyngeal Swab; Nasopharyngeal(NP) swabs in vial transport medium  Result Value Ref Range Status   SARS Coronavirus 2 by RT PCR NEGATIVE NEGATIVE Final    Comment: (NOTE) SARS-CoV-2 target nucleic acids are NOT DETECTED.  The SARS-CoV-2 RNA is generally detectable in upper respiratory specimens during the acute phase of infection. The lowest concentration of SARS-CoV-2 viral copies this assay can detect is 138 copies/mL. A negative result does not preclude SARS-Cov-2 infection and should not be used as the sole basis for treatment or other patient management decisions. A negative result may occur with  improper specimen collection/handling, submission of specimen other than nasopharyngeal swab,  presence of viral mutation(s) within the areas targeted by this assay, and inadequate number of viral copies(<138 copies/mL). A negative result must be combined with clinical observations, patient history, and epidemiological information. The expected result is Negative.  Fact Sheet for Patients:  BloggerCourse.com  Fact Sheet for Healthcare Providers:  SeriousBroker.it  This test is no t yet approved or cleared by the [place] FDA and  has been authorized for detection and/or diagnosis of SARS-CoV-2 by FDA under an Emergency Use Authorization (EUA). This EUA will remain  in effect (meaning this test can be used) for the duration of the COVID-19 declaration under Section 564(b)(1) of the Act, 21 U.S.C.section 360bbb-3(b)(1), unless the authorization is terminated  or revoked sooner.       Influenza A by PCR NEGATIVE NEGATIVE Final   Influenza B by PCR NEGATIVE NEGATIVE Final    Comment: (NOTE) The Xpert Xpress SARS-CoV-2/FLU/RSV plus assay is intended as an aid in the diagnosis of influenza from Nasopharyngeal swab specimens and should not be used as a sole basis for treatment. Nasal washings and aspirates are unacceptable for Xpert Xpress SARS-CoV-2/FLU/RSV testing.  Fact Sheet for Patients: BloggerCourse.com  Fact Sheet for Healthcare Providers: SeriousBroker.it  This test is not yet approved or cleared by the [place] FDA and has been authorized for detection and/or diagnosis of SARS-CoV-2 by FDA under an Emergency Use Authorization (EUA). This EUA will remain in effect (meaning this test can be used) for the duration of the COVID-19 declaration under Section 564(b)(1) of the Act, 21 U.S.C. section 360bbb-3(b)(1), unless the authorization is terminated or revoked.  Performed at [facility], [address], [place], [place] [address]   MRSA  PCR Screening     Status: None   Collection Time: 04/04/20  5:19 PM   Specimen: Nasal Mucosa; Nasopharyngeal  Result Value Ref Range Status   MRSA by PCR NEGATIVE NEGATIVE Final    Comment:        The GeneXpert MRSA Assay (FDA approved for NASAL specimens only), is one component of a comprehensive MRSA colonization surveillance program. It is not intended to diagnose MRSA infection nor to guide or monitor treatment for MRSA infections. Performed at [facility], [address], [place], [place] [address]     Radiology Studies: DG Abd Portable 1V  Result Date: 04/08/2020 CLINICAL DATA:  Generalized pain EXAM: PORTABLE ABDOMEN - 1 VIEW COMPARISON:  CT 04/04/2020 FINDINGS: Gaseous dilatation of left lower quadrant small bowel measuring up to 3.5 cm with scattered colon gas. No radiopaque calculi. IMPRESSION: Mild gaseous dilatation of left lower quadrant small bowel with gas present in the colon, suggestive of ileus. Electronically Signed   By: Jasmine Pang M.D.   On: 04/08/2020 19:01       Kora Groom T. Christiano Blandon [facility]  If 7PM-7AM, please contact night-coverage [email or web address] 04/09/2020, 11:27 AM

## 2020-04-09 NOTE — Evaluation (Signed)
Physical Therapy Evaluation Patient Details Name: Peter [name] MRN: [id number] DOB: January 11, 1981 Today's Date: 04/09/2020   History of Present Illness  40 year old M with PMH of alcohol abuse, HTN, carditis, depression and anxiety presenting with 3 to 4 days of abdominal pain and nausea and admitted to ICU for alcohol withdrawal and severe sepsis due to possible aspiration pneumonia.  He was treated with Precedex and IV antibiotics.  Withdrawal symptoms have improved,  Clinical Impression  Pt admitted as above and presenting with functional mobility limitations 2* balance deficits associated with ETOH abuse and WD.  Pt should progress to dc home with no PT follow up.    Follow Up Recommendations No PT follow up    Equipment Recommendations  None recommended by PT    Recommendations for Other Services       Precautions / Restrictions Precautions Precautions: Fall Restrictions Weight Bearing Restrictions: No      Mobility  Bed Mobility Overal bed mobility: Modified Independent             General bed mobility comments: Pt to EOB unassisted    Transfers Overall transfer level: Needs assistance Equipment used: Rolling walker (2 wheeled) Transfers: Sit to/from Stand Sit to Stand: Min guard         General transfer comment: Steady assist with cues for use of UEs to self assist  Ambulation/Gait Ambulation/Gait assistance: Min assist Gait Distance (Feet): 450 Feet Assistive device: Rolling walker (2 wheeled) Gait Pattern/deviations: Step-to pattern;Step-through pattern;Shuffle;Wide base of support;Staggering left;Staggering right Gait velocity: mod pace   General Gait Details: general instability all directions largely compensated with use of RW and with noted improvement with increased distance ambulated  Stairs            Wheelchair Mobility    Modified Rankin (Stroke Patients Only)       Balance Overall balance assessment: Needs  assistance Sitting-balance support: No upper extremity supported;Feet supported Sitting balance-Leahy Scale: Good     Standing balance support: Bilateral upper extremity supported Standing balance-Leahy Scale: Poor                               Pertinent Vitals/Pain Pain Assessment: No/denies pain    Home Living Family/patient expects to be discharged to:: Private residence Living Arrangements: Parent Available Help at Discharge: Available PRN/intermittently Type of Home: House Home Access: Ramped entrance     Home Layout: One level Home Equipment: Environmental consultant - 2 wheels;Cane - single point;Shower seat Additional Comments: 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  Friends are currently assist pt's father while pt is in hospitalq    Prior Function Level of Independence: Independent               Hand Dominance        Extremity/Trunk Assessment   Upper Extremity Assessment Upper Extremity Assessment: Overall WFL for tasks assessed    Lower Extremity Assessment Lower Extremity Assessment: Overall WFL for tasks assessed    Cervical / Trunk Assessment Cervical / Trunk Assessment: Normal  Communication   Communication: No difficulties  Cognition Arousal/Alertness: Awake/alert Behavior During Therapy: WFL for tasks assessed/performed;Impulsive;Flat affect Overall Cognitive Status: Within Functional Limits for tasks assessed                                        General Comments  Exercises     Assessment/Plan    PT Assessment Patient needs continued PT services  PT Problem List Decreased activity tolerance;Decreased balance;Decreased mobility;Decreased safety awareness;Decreased knowledge of use of DME       PT Treatment Interventions DME instruction;Gait training;Functional mobility training;Therapeutic activities;Therapeutic exercise;Balance training;Patient/family education    PT Goals  (Current goals can be found in the Care Plan section)  Acute Rehab PT Goals Patient Stated Goal: Regain IND PT Goal Formulation: With patient Time For Goal Achievement: 04/23/20 Potential to Achieve Goals: Good    Frequency Min 3X/week   Barriers to discharge        Co-evaluation PT/OT/SLP Co-Evaluation/Treatment: Yes Reason for Co-Treatment: For patient/therapist safety PT goals addressed during session: Mobility/safety with mobility;Balance OT goals addressed during session: ADL's and self-care       AM-PAC PT "6 Clicks" Mobility  Outcome Measure Help needed turning from your back to your side while in a flat bed without using bedrails?: None Help needed moving from lying on your back to sitting on the side of a flat bed without using bedrails?: None Help needed moving to and from a bed to a chair (including a wheelchair)?: A Little Help needed standing up from a chair using your arms (e.g., wheelchair or bedside chair)?: A Little Help needed to walk in hospital room?: A Little Help needed climbing 3-5 steps with a railing? : A Little 6 Click Score: 20    End of Session Equipment Utilized During Treatment: Gait belt;Oxygen Activity Tolerance: Patient tolerated treatment well Patient left: in chair;with call bell/phone within reach;with nursing/sitter in room Nurse Communication: Mobility status PT Visit Diagnosis: Difficulty in walking, not elsewhere classified (R26.2)    Time: 1040-1056 PT Time Calculation (min) (ACUTE ONLY): 16 min   Charges:   PT Evaluation $PT Eval Low Complexity: 1 Low          Mauro Kaufmann PT Acute Rehabilitation Services Pager [phone] Office [phone]   Franklin Baumbach 04/09/2020, 12:36 PM

## 2020-04-09 NOTE — Evaluation (Signed)
Occupational Therapy Evaluation Patient Details Name: Peter [name] MRN: [id number] DOB: 20-Nov-1980 Today's Date: 04/09/2020    History of Present Illness 40 year old M with PMH of alcohol abuse, HTN, carditis, depression and anxiety presenting with 3 to 4 days of abdominal pain and nausea and admitted to ICU for alcohol withdrawal and severe sepsis due to possible aspiration pneumonia.  He was treated with Precedex and IV antibiotics.  Withdrawal symptoms have improved,   Clinical Impression   Peter [name] is a 40 year old man with above medical history who presents with impaired balance after ETOH withdrawal. On evaluation patient demonstrates normal ROM, strength and coordination. Is modified independent for bed mobility and is able to perform all ADLs without physical assistance. Patient ambulated in hall with RW and slightly unsteady. In regards to self care patient is near baseline. Patient limited by impaired balance and safety concerns. No OT needs at this time. Will defer to PT for balance and gait training. Expect patient to recover well in hospital and have no needs at discharge.    Follow Up Recommendations  No OT follow up    Equipment Recommendations  None recommended by OT    Recommendations for Other Services       Precautions / Restrictions Precautions Precautions: Fall Restrictions Weight Bearing Restrictions: No      Mobility Bed Mobility Overal bed mobility: Modified Independent             General bed mobility comments: Pt to EOB unassisted    Transfers Overall transfer level: Needs assistance Equipment used: Rolling walker (2 wheeled) Transfers: Sit to/from Stand Sit to Stand: Min guard         General transfer comment: Steady assist with cues for use of UEs to self assist    Balance Overall balance assessment: Needs assistance Sitting-balance support: No upper extremity supported;Feet supported Sitting balance-Leahy Scale:  Good     Standing balance support: Bilateral upper extremity supported Standing balance-Leahy Scale: Poor                             ADL either performed or assessed with clinical judgement   ADL Overall ADL's : Needs assistance/impaired                                       General ADL Comments: Patient did not require physical assistance for ADL tasks - just supervison for safety.     Vision   Vision Assessment?: No apparent visual deficits     Perception     Praxis      Pertinent Vitals/Pain Pain Assessment: No/denies pain     Hand Dominance     Extremity/Trunk Assessment Upper Extremity Assessment Upper Extremity Assessment: Overall WFL for tasks assessed   Lower Extremity Assessment Lower Extremity Assessment: Defer to PT evaluation   Cervical / Trunk Assessment Cervical / Trunk Assessment: Normal   Communication Communication Communication: No difficulties   Cognition Arousal/Alertness: Awake/alert Behavior During Therapy: WFL for tasks assessed/performed Overall Cognitive Status: Within Functional Limits for tasks assessed                                     General Comments       Exercises     Shoulder Instructions  Home Living Family/patient expects to be discharged to:: Private residence Living Arrangements: Parent Available Help at Discharge: Available PRN/intermittently Type of Home: House Home Access: Ramped entrance     Home Layout: One level     Bathroom Shower/Tub: Producer, television/film/video: Standard     Home Equipment: Environmental consultant - 2 wheels;Cane - single point;Shower seat   Additional Comments: 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  Friends are currently assist pt's father while pt is in hospitalq      Prior Functioning/Environment Level of Independence: Independent                 OT Problem List: Impaired balance (sitting  and/or standing)      OT Treatment/Interventions:      OT Goals(Current goals can be found in the care plan section) Acute Rehab OT Goals Patient Stated Goal: Regain IND OT Goal Formulation: All assessment and education complete, DC therapy  OT Frequency:     Barriers to D/C:            Co-evaluation PT/OT/SLP Co-Evaluation/Treatment: Yes Reason for Co-Treatment: For patient/therapist safety PT goals addressed during session: Mobility/safety with mobility;Balance OT goals addressed during session: ADL's and self-care      AM-PAC OT "6 Clicks" Daily Activity     Outcome Measure Help from another person eating meals?: None Help from another person taking care of personal grooming?: None Help from another person toileting, which includes using toliet, bedpan, or urinal?: None Help from another person bathing (including washing, rinsing, drying)?: None Help from another person to put on and taking off regular upper body clothing?: None Help from another person to put on and taking off regular lower body clothing?: None 6 Click Score: 24   End of Session Equipment Utilized During Treatment: Gait belt;Rolling walker Nurse Communication: Mobility status  Activity Tolerance: Patient tolerated treatment well Patient left: in chair;with call bell/phone within reach;with family/visitor present  OT Visit Diagnosis: Unsteadiness on feet (R26.81)                Time: 2956-2130 OT Time Calculation (min): 24 min Charges:  OT General Charges $OT Visit: 1 Visit OT Evaluation $OT Eval Low Complexity: 1 Low  Abdallah Hern, OTR/L Acute Care Rehab Services  Office [phone] Pager: [phone]   Kelli Churn 04/09/2020, 1:11 PM

## 2020-04-10 DIAGNOSIS — E876 Hypokalemia: Secondary | ICD-10-CM

## 2020-04-10 LAB — RENAL FUNCTION PANEL
Albumin: 2.6 g/dL — ABNORMAL LOW (ref 3.5–5.0)
Anion gap: 12 (ref 5–15)
BUN: 9 mg/dL (ref 6–20)
CO2: 27 mmol/L (ref 22–32)
Calcium: 8.2 mg/dL — ABNORMAL LOW (ref 8.9–10.3)
Chloride: 98 mmol/L (ref 98–111)
Creatinine, Ser: 0.7 mg/dL (ref 0.61–1.24)
GFR, Estimated: >60 mL/min (ref >60)
Glucose, Bld: 207 mg/dL — ABNORMAL HIGH (ref 70–99)
Phosphorus: 2 mg/dL — ABNORMAL LOW (ref 2.5–4.6)
Potassium: 3.2 mmol/L — ABNORMAL LOW (ref 3.5–5.1)
Sodium: 137 mmol/L (ref 135–145)

## 2020-04-10 LAB — GLUCOSE, CAPILLARY
Glucose-Capillary: 169 mg/dL — ABNORMAL HIGH (ref 70–99)
Glucose-Capillary: 189 mg/dL — ABNORMAL HIGH (ref 70–99)
Glucose-Capillary: 189 mg/dL — ABNORMAL HIGH (ref 70–99)
Glucose-Capillary: 196 mg/dL — ABNORMAL HIGH (ref 70–99)
Glucose-Capillary: 198 mg/dL — ABNORMAL HIGH (ref 70–99)
Glucose-Capillary: 215 mg/dL — ABNORMAL HIGH (ref 70–99)
Glucose-Capillary: 254 mg/dL — ABNORMAL HIGH (ref 70–99)

## 2020-04-10 LAB — CBC
HCT: 34.2 % — ABNORMAL LOW (ref 39.0–52.0)
Hemoglobin: 11.5 g/dL — ABNORMAL LOW (ref 13.0–17.0)
MCH: 32.5 pg (ref 26.0–34.0)
MCHC: 33.6 g/dL (ref 30.0–36.0)
MCV: 96.6 fL (ref 80.0–100.0)
Platelets: 201 10*3/uL (ref 150–400)
RBC: 3.54 MIL/uL — ABNORMAL LOW (ref 4.22–5.81)
RDW: 14.8 % (ref 11.5–15.5)
WBC: 17.7 10*3/uL — ABNORMAL HIGH (ref 4.0–10.5)
nRBC: 0.2 % (ref 0.0–0.2)

## 2020-04-10 LAB — MAGNESIUM: Magnesium: 1.8 mg/dL (ref 1.7–2.4)

## 2020-04-10 MED ORDER — POTASSIUM PHOSPHATES 15 MMOLE/5ML IV SOLN
30.0000 mmol | Freq: Once | INTRAVENOUS | Status: AC
  Administered 2020-04-10: 30 mmol via INTRAVENOUS
  Filled 2020-04-10: qty 10

## 2020-04-10 MED ORDER — OXYCODONE HCL 5 MG PO TABS
5.0000 mg | ORAL_TABLET | Freq: Three times a day (TID) | ORAL | Status: DC | PRN
  Administered 2020-04-10 – 2020-04-11 (×4): 5 mg via ORAL
  Filled 2020-04-10 (×4): qty 1

## 2020-04-10 MED ORDER — CHLORDIAZEPOXIDE HCL 5 MG PO CAPS
10.0000 mg | ORAL_CAPSULE | Freq: Two times a day (BID) | ORAL | Status: DC
  Administered 2020-04-10: 10 mg via ORAL
  Filled 2020-04-10: qty 2

## 2020-04-10 MED ORDER — LOSARTAN POTASSIUM 50 MG PO TABS
25.0000 mg | ORAL_TABLET | Freq: Every day | ORAL | Status: DC
  Administered 2020-04-10 – 2020-04-15 (×6): 25 mg via ORAL
  Filled 2020-04-10 (×6): qty 1

## 2020-04-10 MED ORDER — MAGNESIUM SULFATE 2 GM/50ML IV SOLN
2.0000 g | Freq: Once | INTRAVENOUS | Status: AC
  Administered 2020-04-10: 2 g via INTRAVENOUS
  Filled 2020-04-10: qty 50

## 2020-04-10 MED ORDER — POTASSIUM CHLORIDE CRYS ER 20 MEQ PO TBCR
40.0000 meq | EXTENDED_RELEASE_TABLET | Freq: Four times a day (QID) | ORAL | Status: AC
  Administered 2020-04-10: 40 meq via ORAL
  Filled 2020-04-10: qty 2

## 2020-04-10 MED ORDER — CLONIDINE HCL 0.2 MG PO TABS
0.2000 mg | ORAL_TABLET | Freq: Two times a day (BID) | ORAL | Status: DC
  Administered 2020-04-10 – 2020-04-14 (×9): 0.2 mg via ORAL
  Filled 2020-04-10 (×9): qty 1

## 2020-04-10 MED ORDER — HYDROMORPHONE HCL 1 MG/ML IJ SOLN
0.5000 mg | INTRAMUSCULAR | Status: DC | PRN
  Administered 2020-04-10: 0.5 mg via INTRAVENOUS
  Filled 2020-04-10: qty 0.5

## 2020-04-10 NOTE — TOC CAGE-AID Note (Signed)
Transition of Care [facility]) - CAGE-AID Screening   Patient Details  Name: Peter [name] MRN: [id number] Date of Birth: 1980/06/05  Transition of [facility]) CM/SW Contact:    Joanne Chars, LCSW Phone Number: 04/10/2020, 11:15 AM   Clinical Narrative: CSW met with pt for cage aid screening.  Pt reports daily alcohol use of a fifth of liquor "for a few months."  Pt denies current drug use.  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  He reports motivation to stop drinking as well and is aware it is taking a toll on his health.  Pt has past AA involvement as well, but not recent.  CSW went over local treatment options for both outpatient and residential treatment and pt showed interest in this.  Pt verbalizes understanding that he can reach out to these providers and treatment is available even without health insurance.     CAGE-AID Screening:    Have You Ever Felt You Ought to Cut Down on Your Drinking or Drug Use?: Yes Have People Annoyed You By Critizing Your Drinking Or Drug Use?: Yes Have You Felt Bad Or Guilty About Your Drinking Or Drug Use?: Yes Have You Ever Had a Drink or Used Drugs First Thing In The Morning to Steady Your Nerves or to Get Rid of a Hangover?: Yes CAGE-AID Score: 4  Substance Abuse Education Offered: Yes  Substance abuse interventions: Patient Counseling,Other (must comment) (treatment contact information)

## 2020-04-10 NOTE — Progress Notes (Signed)
PROGRESS NOTE  Peter StairsBrandon Cleaves ZOX:[id number] DOB: 30-Jan-1981   PCP: Kallie LocksStroud, Natalie M, FNP  Patient is from: Home  DOA: 04/04/2020 LOS: 6  Chief complaints: Abdominal pain  Brief Narrative / Interim history: 40 year old M with PMH of alcohol abuse, HTN, carditis, depression and anxiety presenting with 3 to 4 days of abdominal pain and nausea and admitted to ICU for alcohol withdrawal and severe sepsis due to possible aspiration pneumonia.  He was treated with Precedex and IV antibiotics.  Withdrawal symptoms improved, and [facility]  Hospital course notes worsening of ileus, electrolyte derangement and physical deconditioning.  Subjective: Seen and examined earlier this morning.  No major events overnight or this morning.  He says he had 7/10 diffuse abdominal pain overnight that has improved after pain medication.  Last bowel movement this morning.  Denies nausea or vomiting.  Denies chest pain or dyspnea.  Objective: Vitals:   04/09/20 2044 04/10/20 0015 04/10/20 0507 04/10/20 1400  BP: 140/80 (!) 141/84 (!) 168/95 (!) 149/83  Pulse: (!) 106 (!) 101 (!) 110 100  Resp: 18 18 16 17   Temp: 99.9 F (37.7 C) 98.2 F (36.8 C) 99.4 F (37.4 C) 98.7 F (37.1 C)  TempSrc: Oral Oral Oral Oral  SpO2: 90% 93% 92% 90%  Weight:      Height:        Intake/Output Summary (Last 24 hours) at 04/10/2020 1506 Last data filed at 04/10/2020 1401 Gross per 24 hour  Intake 60 ml  Output 0 ml  Net 60 ml   Filed Weights   04/04/20 0004  Weight: 103.4 kg    Examination:  GENERAL: No apparent distress.  Nontoxic. HEENT: MMM.  Vision and hearing grossly intact.  NECK: Supple.  No apparent JVD.  RESP: On RA.  No IWOB.  Fair aeration bilaterally. CVS:  RRR. Heart sounds normal.  ABD/GI/GU: BS+.  Abdomen is slightly distended.  Mild diffuse tenderness with deep palpation. MSK/EXT:  Moves extremities. No apparent deformity. No edema.  SKIN: no apparent  skin lesion or wound NEURO: Awake, alert and oriented appropriately.  No apparent focal neuro deficit. PSYCH: Calm. Normal affect.  Procedures:  None  Microbiology summarized: Influenza and COVID-19 PCR nonreactive.  Assessment & Plan: Acute pancreatitis-likely due to alcohol. Alcohol abuse with withdrawal-still with tachycardia and elevated BPs.   Encephalopathy/agitation: Improving. Likely from alcohol withdrawal and medications.  Resolved. -Lipase normalized.  Still with some pain but improved. -Continue soft diet -Continue Librium taper with as needed IV Ativan push -Added clonidine mainly due to tachycardia and elevated BP -Continue multivitamin, folic acid and thiamine. -TOC consult for resources and counseling  Acute respiratory failure with hypoxia-saturating in the upper 80s to low 90s on room air. Severe sepsis due to aspiration pneumonia Possible LLL aspiration pneumonia Sleep Related Hypoxemia -Portable chest x-ray today -Continue weaning oxygen as able -OOB/PT/OT/incentive spirometry -Continue IV ceftriaxone -Continue as needed duonebs as needed -PCCM to arrange outpatient sleep study but wants to be notified prior to discharge  Hypokalemia/hypomagnesemia/hypophosphatemia-due to alcohol? -Replenish and recheck.  Abdominal pain/ileus-mild. -Minimize opiates-reduced opiates. -Mobilize. -OOB/PT/OT -As needed MiraLAX  Uncontrolled hypertension/sinus tachycardia-due to alcohol withdrawal?.  He could have underlying undiagnosed sleep apnea. Improved -Continue home amlodipine. -Changed clonidine IV to p.o. -Continue Coreg -Resume home losartan at reduced dose -Outpatient sleep study as above  Acute Kidney Injury/hyperkalemia/hyponatremia-old due to alcohol.  Resolved. -Encourage oral intake  Anxiety/depression -Continue home Effexor and BuSpar.  Leukocytosis: Could be due to  sepsis.  Could also be due to demargination. Improved. -Continue  monitoring  Thrombocytopenia: Likely from alcohol. Resolved. -Continue monitoring  Debility/physical deconditioning-likely due to acute illness. -Therapy did not think he needs PT or DME. However, unsteady when he walked with RN  Obesity Body mass index is 32.71 kg/m.         DVT prophylaxis:  enoxaparin (LOVENOX) injection 40 mg Start: 04/04/20 1200  Code Status: Full code Family Communication: Patient and/or RN. Available if any question.  Level of care: Telemetry Status is: Inpatient  Remains inpatient appropriate because:Hemodynamically unstable, Persistent severe electrolyte disturbances, Unsafe d/c plan, IV treatments appropriate due to intensity of illness or inability to take PO and Inpatient level of care appropriate due to severity of illness   Dispo: The patient is from: Home              Anticipated d/c is to: Home              Anticipated d/c date is: 1 day              Patient currently is not medically stable to d/c.   Difficult to place patient No       Consultants:  PCCM-signed off   Sch Meds:  Scheduled Meds: . acetaminophen  1,000 mg Oral Q8H  . amLODipine  10 mg Oral Daily  . busPIRone  7.5 mg Oral TID  . carvedilol  6.25 mg Oral BID WC  . chlordiazePOXIDE  10 mg Oral BID  . chlorhexidine  15 mL Mouth Rinse BID  . Chlorhexidine Gluconate Cloth  6 each Topical Daily  . cloNIDine  0.2 mg Oral BID  . enoxaparin (LOVENOX) injection  40 mg Subcutaneous Q24H  . folic acid  1 mg Oral Daily  . losartan  25 mg Oral Daily  . mouth rinse  15 mL Mouth Rinse q12n4p  . mouth rinse  15 mL Mouth Rinse BID  . multivitamin with minerals  1 tablet Oral Daily  . nicotine  14 mg Transdermal Daily  . pantoprazole  40 mg Oral BID  . sodium chloride flush  3 mL Intravenous Q12H  . thiamine  100 mg Oral Daily   Or  . thiamine  100 mg Intravenous Daily  . venlafaxine XR  75 mg Oral Q breakfast   Continuous Infusions: . cefTRIAXone (ROCEPHIN)  IV 2 g  (04/10/20 1431)  . potassium PHOSPHATE IVPB (in mmol) 30 mmol (04/10/20 0930)   PRN Meds:.albuterol, HYDROmorphone (DILAUDID) injection, labetalol, ondansetron **OR** ondansetron (ZOFRAN) IV, oxyCODONE, polyethylene glycol  Antimicrobials: Anti-infectives (From admission, onward)   Start     Dose/Rate Route Frequency Ordered Stop   04/06/20 1300  cefTRIAXone (ROCEPHIN) 2 g in sodium chloride 0.9 % 100 mL IVPB        2 g 200 mL/hr over 30 Minutes Intravenous Every 24 hours 04/06/20 1202         I have personally reviewed the following labs and images: CBC: Recent Labs  Lab 04/04/20 0048 04/05/20 0334 04/06/20 0750 04/07/20 1034 04/08/20 0533 04/09/20 0237 04/10/20 0616  WBC 24.8*   < > 15.8* 19.4* 20.9* 15.8* 17.7*  NEUTROABS 22.5*  --   --   --   --  11.8*  --   HGB 17.1*   < > 12.5* 12.8* 12.9* 11.7* 11.5*  HCT 49.3   < > 36.5* 37.8* 38.6* 35.9* 34.2*  MCV 93.4   < > 95.3 96.7 97.0 98.9 96.6  PLT 204   < >  117* 145* 147* 167 201   < > = values in this interval not displayed.   BMP &GFR Recent Labs  Lab 04/04/20 1155 04/05/20 0334 04/05/20 1139 04/06/20 0750 04/07/20 1034 04/08/20 0533 04/09/20 0237 04/10/20 0616  NA  --  130*   < > 133* 136 136 137 137  K  --  6.2*   < > 4.4 3.8 3.8 4.0 3.2*  CL  --  97*   < > 100 99 98 100 98  CO2  --  21*   < > 23 22 25 26 27   GLUCOSE  --  184*   < > 145* 157* 190* 179* 207*  BUN  --  18   < > 25* 18 12 10 9   CREATININE  --  0.96   < > 1.05 1.01 0.79 0.68 0.70  CALCIUM  --  8.2*   < > 7.9* 8.6* 8.5* 8.4* 8.2*  MG 1.4* 2.0  --   --   --  1.9 2.0 1.8  PHOS 3.0 2.2*  --   --   --  3.4 2.3* 2.0*   < > = values in this interval not displayed.   Estimated Creatinine Clearance: 149.4 mL/min (by C-G formula based on SCr of 0.7 mg/dL). Liver & Pancreas: Recent Labs  Lab 04/04/20 0048 04/05/20 0334 04/06/20 0750 04/07/20 1034 04/08/20 0533 04/09/20 0237 04/10/20 0616  AST 59* 69* 28 24 28   --   --   ALT 73* 59* 32 26 26   --   --   ALKPHOS 67 48 51 73 88  --   --   BILITOT 0.8 3.3* 1.3* 1.5* 1.4*  --   --   PROT 8.0 6.3* 6.0* 7.2 7.4  --   --   ALBUMIN 4.2 3.1* 2.6* 2.9* 3.0* 2.7* 2.6*   Recent Labs  Lab 04/04/20 0048 04/05/20 0334 04/07/20 1034 04/08/20 1851  LIPASE 1,076* 200* 31 35   No results for input(s): AMMONIA in the last 168 hours. Diabetic: No results for input(s): HGBA1C in the last 72 hours. Recent Labs  Lab 04/09/20 2047 04/10/20 0017 04/10/20 0508 04/10/20 0726 04/10/20 1153  GLUCAP 171* 169* 189* 196* 254*   Cardiac Enzymes: No results for input(s): CKTOTAL, CKMB, CKMBINDEX, TROPONINI in the last 168 hours. No results for input(s): PROBNP in the last 8760 hours. Coagulation Profile: Recent Labs  Lab 04/04/20 0602  INR 1.1   Thyroid Function Tests: No results for input(s): TSH, T4TOTAL, FREET4, T3FREE, THYROIDAB in the last 72 hours. Lipid Profile: No results for input(s): CHOL, HDL, LDLCALC, TRIG, CHOLHDL, LDLDIRECT in the last 72 hours. Anemia Panel: No results for input(s): VITAMINB12, FOLATE, FERRITIN, TIBC, IRON, RETICCTPCT in the last 72 hours. Urine analysis:    Component Value Date/Time   COLORURINE AMBER (A) 04/04/2020 2354   APPEARANCEUR CLEAR 04/04/2020 2354   LABSPEC 1.025 04/04/2020 2354   PHURINE 5.0 04/04/2020 2354   GLUCOSEU 50 (A) 04/04/2020 2354   HGBUR SMALL (A) 04/04/2020 2354   BILIRUBINUR NEGATIVE 04/04/2020 2354   BILIRUBINUR Negative 01/28/2019 1521   KETONESUR 5 (A) 04/04/2020 2354   PROTEINUR 100 (A) 04/04/2020 2354   UROBILINOGEN 0.2 01/28/2019 1521   UROBILINOGEN 0.2 11/13/2016 1234   NITRITE NEGATIVE 04/04/2020 2354   LEUKOCYTESUR NEGATIVE 04/04/2020 2354   Sepsis Labs: Invalid input(s): PROCALCITONIN, LACTICIDVEN  Microbiology: Recent Results (from the past 240 hour(s))  Resp Panel by RT-PCR (Flu A&B, Covid) Nasopharyngeal Swab     Status: None  Collection Time: 04/04/20  6:49 AM   Specimen: Nasopharyngeal Swab;  Nasopharyngeal(NP) swabs in vial transport medium  Result Value Ref Range Status   SARS Coronavirus 2 by RT PCR NEGATIVE NEGATIVE Final    Comment: (NOTE) SARS-CoV-2 target nucleic acids are NOT DETECTED.  The SARS-CoV-2 RNA is generally detectable in upper respiratory specimens during the acute phase of infection. The lowest concentration of SARS-CoV-2 viral copies this assay can detect is 138 copies/mL. A negative result does not preclude SARS-Cov-2 infection and should not be used as the sole basis for treatment or other patient management decisions. A negative result may occur with  improper specimen collection/handling, submission of specimen other than nasopharyngeal swab, presence of viral mutation(s) within the areas targeted by this assay, and inadequate number of viral copies(<138 copies/mL). A negative result must be combined with clinical observations, patient history, and epidemiological information. The expected result is Negative.  Fact Sheet for Patients:  BloggerCourse.com  Fact Sheet for Healthcare Providers:  SeriousBroker.it  This test is no t yet approved or cleared by the [place] FDA and  has been authorized for detection and/or diagnosis of SARS-CoV-2 by FDA under an Emergency Use Authorization (EUA). This EUA will remain  in effect (meaning this test can be used) for the duration of the COVID-19 declaration under Section 564(b)(1) of the Act, 21 U.S.C.section 360bbb-3(b)(1), unless the authorization is terminated  or revoked sooner.       Influenza A by PCR NEGATIVE NEGATIVE Final   Influenza B by PCR NEGATIVE NEGATIVE Final    Comment: (NOTE) The Xpert Xpress SARS-CoV-2/FLU/RSV plus assay is intended as an aid in the diagnosis of influenza from Nasopharyngeal swab specimens and should not be used as a sole basis for treatment. Nasal washings and aspirates are unacceptable for Xpert Xpress  SARS-CoV-2/FLU/RSV testing.  Fact Sheet for Patients: BloggerCourse.com  Fact Sheet for Healthcare Providers: SeriousBroker.it  This test is not yet approved or cleared by the [place] FDA and has been authorized for detection and/or diagnosis of SARS-CoV-2 by FDA under an Emergency Use Authorization (EUA). This EUA will remain in effect (meaning this test can be used) for the duration of the COVID-19 declaration under Section 564(b)(1) of the Act, 21 U.S.C. section 360bbb-3(b)(1), unless the authorization is terminated or revoked.  Performed at [facility], [address] [place], [place] [address]   MRSA PCR Screening     Status: None   Collection Time: 04/04/20  5:19 PM   Specimen: Nasal Mucosa; Nasopharyngeal  Result Value Ref Range Status   MRSA by PCR NEGATIVE NEGATIVE Final    Comment:        The GeneXpert MRSA Assay (FDA approved for NASAL specimens only), is one component of a comprehensive MRSA colonization surveillance program. It is not intended to diagnose MRSA infection nor to guide or monitor treatment for MRSA infections. Performed at [facility], [address], [place], [place] [address]     Radiology Studies: No results found.     Jamori Biggar T. [facility]  If 7PM-7AM, please contact night-coverage [email or web address] 04/10/2020, 3:06 PM

## 2020-04-11 ENCOUNTER — Inpatient Hospital Stay (HOSPITAL_COMMUNITY): Payer: Self-pay

## 2020-04-11 DIAGNOSIS — K8591 Acute pancreatitis with uninfected necrosis, unspecified: Secondary | ICD-10-CM

## 2020-04-11 LAB — CBC WITH DIFFERENTIAL/PLATELET
Abs Immature Granulocytes: 0.82 10*3/uL — ABNORMAL HIGH (ref 0.00–0.07)
Basophils Absolute: 0.1 10*3/uL (ref 0.0–0.1)
Basophils Relative: 1 %
Eosinophils Absolute: 0 10*3/uL (ref 0.0–0.5)
Eosinophils Relative: 0 %
HCT: 34.3 % — ABNORMAL LOW (ref 39.0–52.0)
Hemoglobin: 11.5 g/dL — ABNORMAL LOW (ref 13.0–17.0)
Immature Granulocytes: 4 %
Lymphocytes Relative: 6 %
Lymphs Abs: 1.2 10*3/uL (ref 0.7–4.0)
MCH: 32.3 pg (ref 26.0–34.0)
MCHC: 33.5 g/dL (ref 30.0–36.0)
MCV: 96.3 fL (ref 80.0–100.0)
Monocytes Absolute: 1.2 10*3/uL — ABNORMAL HIGH (ref 0.1–1.0)
Monocytes Relative: 6 %
Neutro Abs: 16.2 10*3/uL — ABNORMAL HIGH (ref 1.7–7.7)
Neutrophils Relative %: 83 %
Platelets: 282 10*3/uL (ref 150–400)
RBC: 3.56 MIL/uL — ABNORMAL LOW (ref 4.22–5.81)
RDW: 14.9 % (ref 11.5–15.5)
WBC: 19.6 10*3/uL — ABNORMAL HIGH (ref 4.0–10.5)
nRBC: 0 % (ref 0.0–0.2)

## 2020-04-11 LAB — COMPREHENSIVE METABOLIC PANEL
ALT: 25 U/L (ref 0–44)
AST: 22 U/L (ref 15–41)
Albumin: 2.7 g/dL — ABNORMAL LOW (ref 3.5–5.0)
Alkaline Phosphatase: 77 U/L (ref 38–126)
Anion gap: 11 (ref 5–15)
BUN: 8 mg/dL (ref 6–20)
CO2: 26 mmol/L (ref 22–32)
Calcium: 8.3 mg/dL — ABNORMAL LOW (ref 8.9–10.3)
Chloride: 98 mmol/L (ref 98–111)
Creatinine, Ser: 0.72 mg/dL (ref 0.61–1.24)
GFR, Estimated: >60 mL/min (ref >60)
Glucose, Bld: 199 mg/dL — ABNORMAL HIGH (ref 70–99)
Potassium: 3.2 mmol/L — ABNORMAL LOW (ref 3.5–5.1)
Sodium: 135 mmol/L (ref 135–145)
Total Bilirubin: 1 mg/dL (ref 0.3–1.2)
Total Protein: 7 g/dL (ref 6.5–8.1)

## 2020-04-11 LAB — GLUCOSE, CAPILLARY
Glucose-Capillary: 187 mg/dL — ABNORMAL HIGH (ref 70–99)
Glucose-Capillary: 192 mg/dL — ABNORMAL HIGH (ref 70–99)
Glucose-Capillary: 153 mg/dL — ABNORMAL HIGH (ref 70–99)
Glucose-Capillary: 143 mg/dL — ABNORMAL HIGH (ref 70–99)
Glucose-Capillary: 144 mg/dL — ABNORMAL HIGH (ref 70–99)

## 2020-04-11 LAB — CREATININE, SERUM
Creatinine, Ser: 0.74 mg/dL (ref 0.61–1.24)
GFR, Estimated: >60 mL/min (ref >60)

## 2020-04-11 LAB — PHOSPHORUS: Phosphorus: 2.6 mg/dL (ref 2.5–4.6)

## 2020-04-11 LAB — LACTIC ACID, PLASMA
Lactic Acid, Venous: 0.9 mmol/L (ref 0.5–1.9)
Lactic Acid, Venous: 0.8 mmol/L (ref 0.5–1.9)

## 2020-04-11 LAB — TSH: TSH: 3.46 u[IU]/mL (ref 0.350–4.500)

## 2020-04-11 LAB — MAGNESIUM: Magnesium: 2 mg/dL (ref 1.7–2.4)

## 2020-04-11 LAB — LIPASE, BLOOD: Lipase: 63 U/L — ABNORMAL HIGH (ref 11–51)

## 2020-04-11 IMAGING — CT CT ABD-PELV W/ CM
2 of 4 series · 15 of 46 positions shown, 17 images · IV contrast (OMNIPAQUE)
Comparison: [DATE]

CLINICAL DATA: Acute abdominal pain and now with some back pain,
follow-up pancreatitis

EXAM:
CT ABDOMEN AND PELVIS WITH CONTRAST
TECHNIQUE: Multidetector CT imaging of the abdomen and pelvis was performed
using the standard protocol following bolus administration of
intravenous contrast. Sagittal and coronal MPR images reconstructed
from axial data set.
CONTRAST:  100mL OMNIPAQUE IOHEXOL 300 MG/ML SOLN IV. No oral
contrast.

[Series 2: axial st · axial · 0.87mm/px · z∈[+1044,+1524]mm · 12 of 108 slices shown, 14 images]
[im 6/108  soft-tissue]
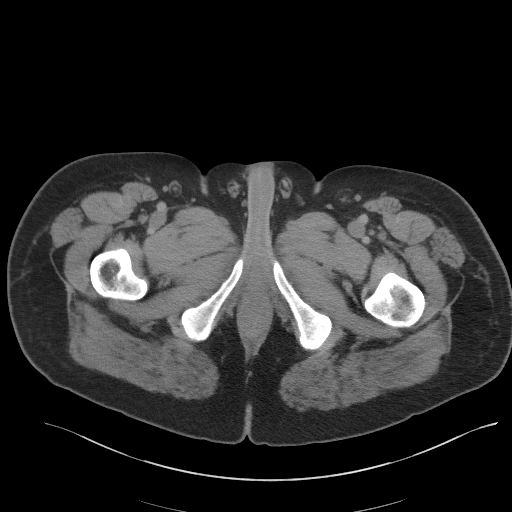
[im 6/108  bone]
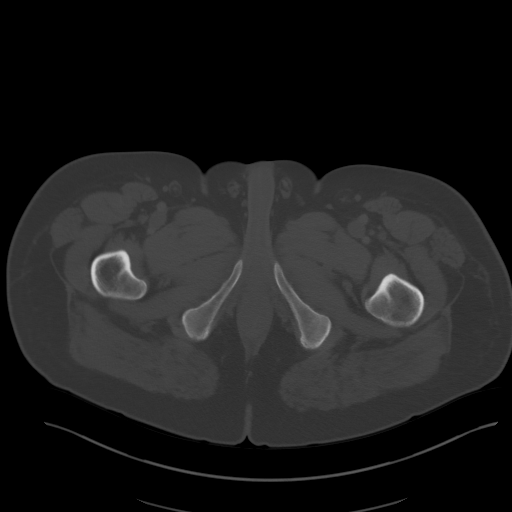
[im 18/108  soft-tissue]
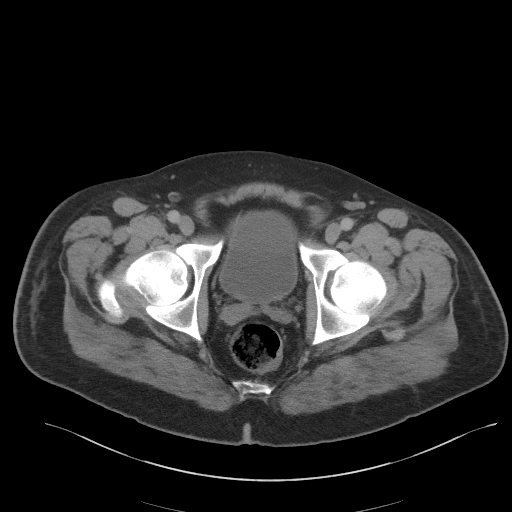
[im 24/108  soft-tissue]
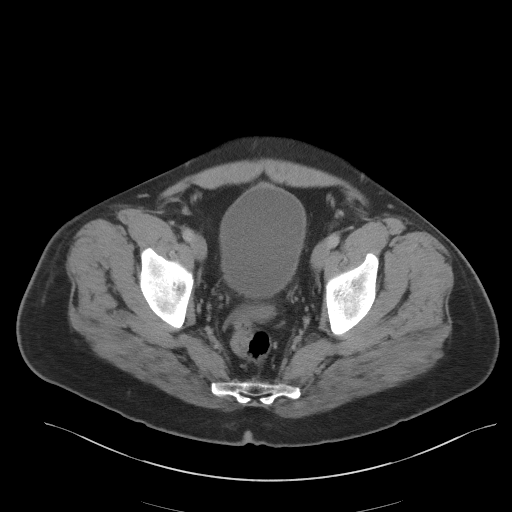
[im 30/108  soft-tissue]
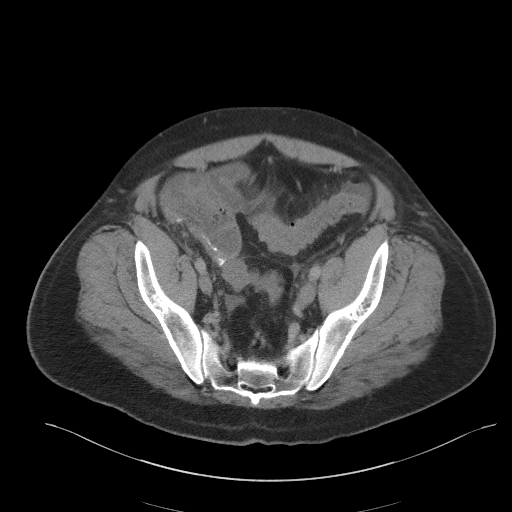
[im 42/108  soft-tissue]
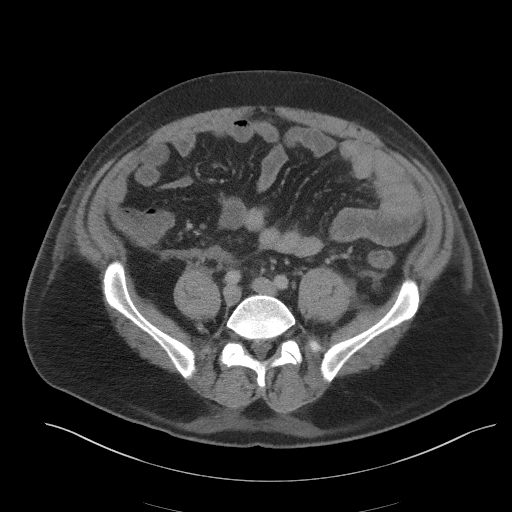
[im 48/108  soft-tissue]
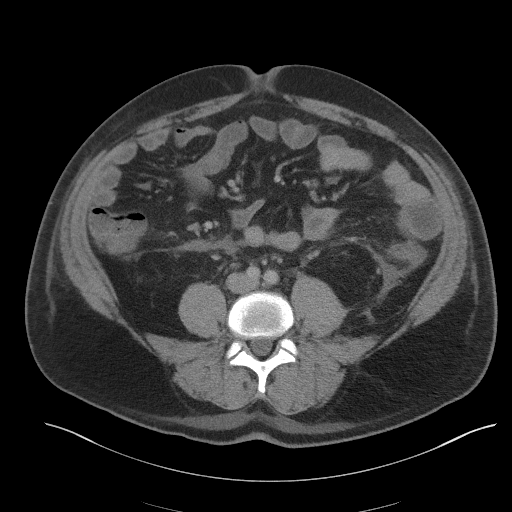
[im 60/108  soft-tissue]
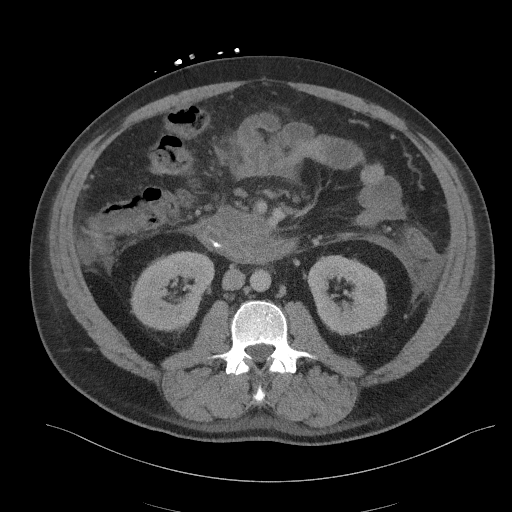
[im 66/108  soft-tissue]
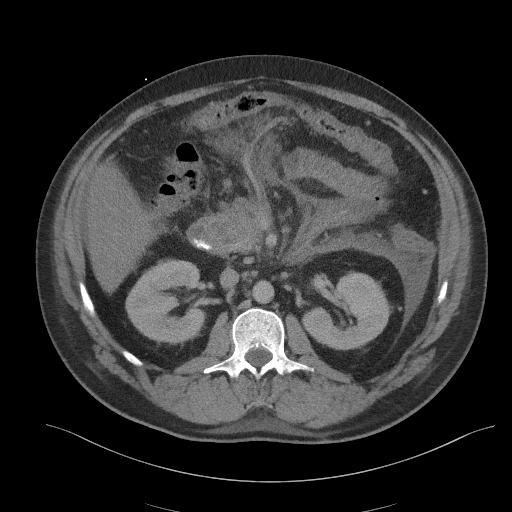
[im 78/108  soft-tissue]
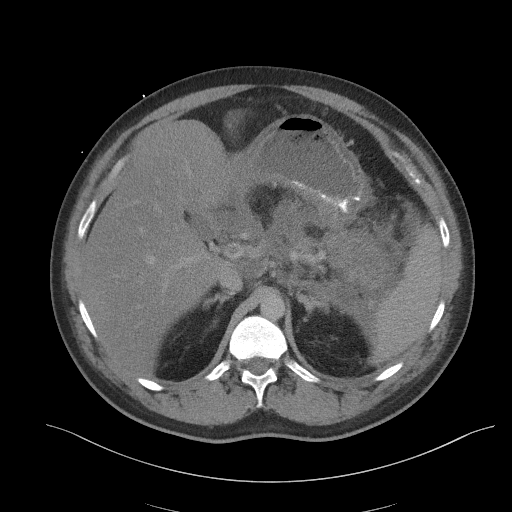
[im 78/108  bone]
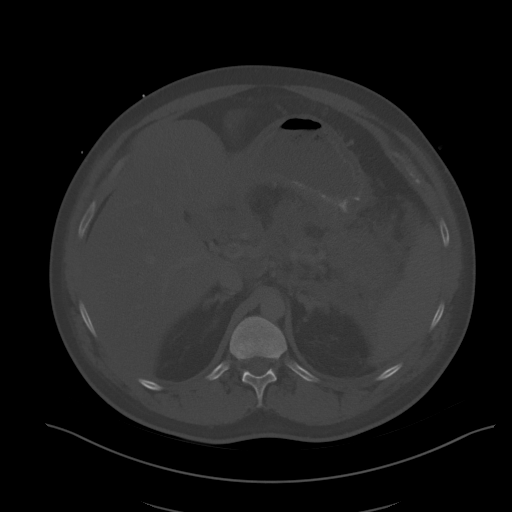
[im 84/108  soft-tissue]
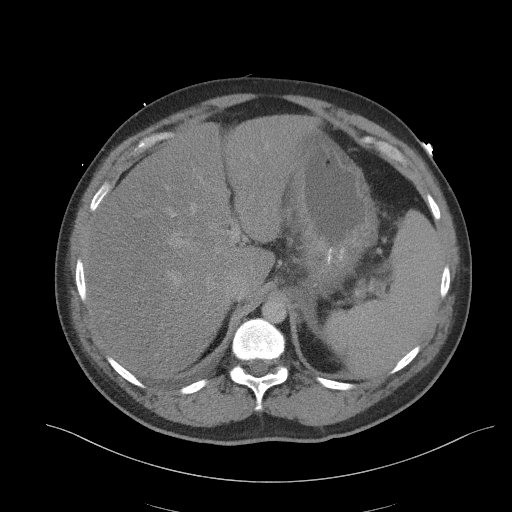
[im 90/108  soft-tissue]
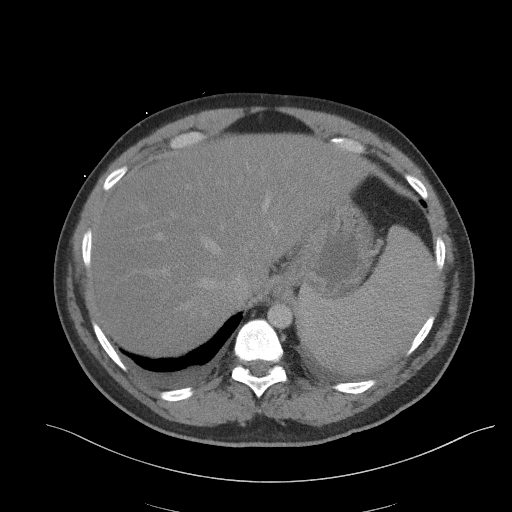
[im 102/108  soft-tissue]
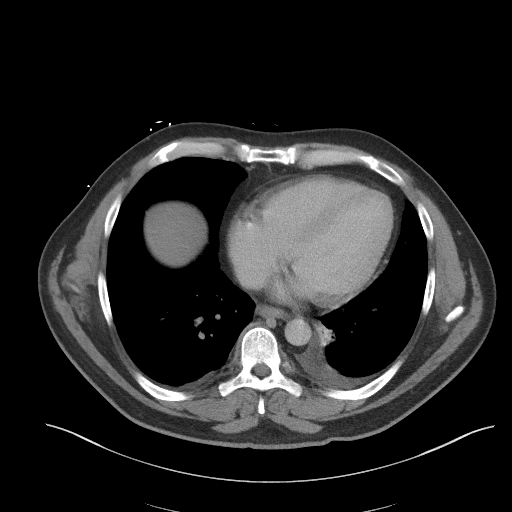

[Series 5: coronal st · coronal · 0.83mm/px · 3 of 112 slices shown]
[im 38/112  soft-tissue]
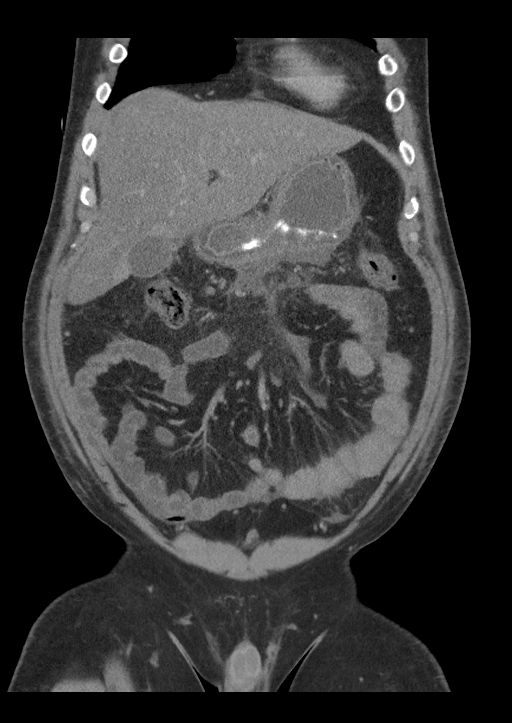
[im 50/112  soft-tissue]
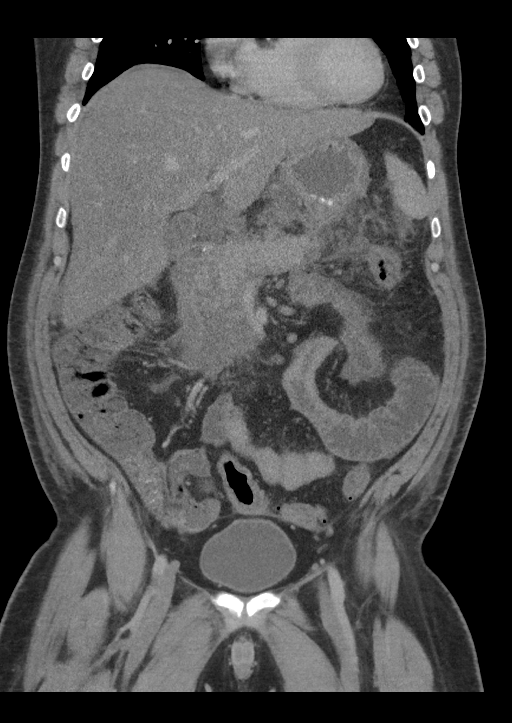
[im 62/112  soft-tissue]
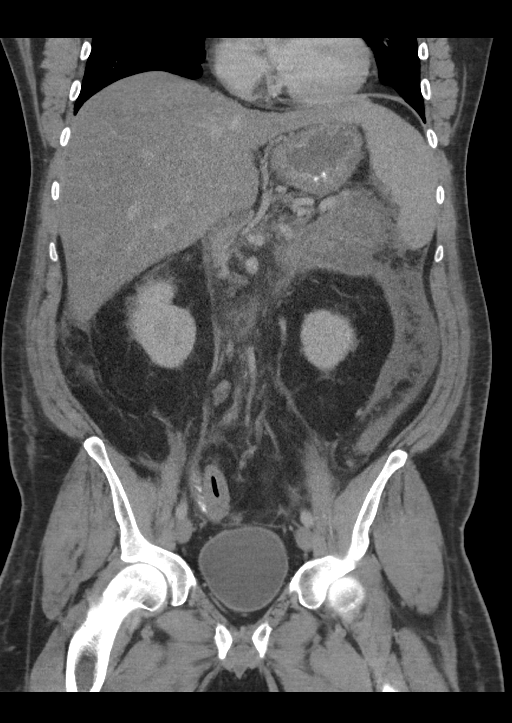

[15 of 46 positions shown; findings below may reference images not displayed]

FINDINGS: Lower chest: Small bibasilar pleural effusions with compressive
atelectasis of LEFT lower lobe.

Hepatobiliary: Fatty infiltration of liver. Dependent tiny calculi
within gallbladder. No focal hepatic mass or definite biliary
dilatation. Focal sparing adjacent to gallbladder fossa.

Pancreas: Enlarged edematous pancreas with extensive surrounding
inflammatory changes of consistent with acute pancreatitis.
Pancreatic tail demonstrates low-attenuation at L lack of
enhancement versus prior study, could represent severe edema or
pancreatic necrosis. No ductal dilatation or pancreatic hemorrhage.
No new pancreatic fluid collections. Increased pancreatic edema
along anterior pararenal fascia into LEFT lateral conal fascia and
adjacent to duodenum.

Spleen: Normal appearance

Adrenals/Urinary Tract: Adrenal glands, kidneys, ureters, and
bladder normal appearance

Stomach/Bowel: Mild gastric wall thickening. Thickening of splenic
flexure of colon and descending colon. Remaining bowel loops
unremarkable. Appendix not identified.

Vascular/Lymphatic: Small filling defect is seen centrally within
the portal vein consistent with nonocclusive portal thrombosis.
Remaining vascular structures patent. Scattered normal size
mesenteric lymph nodes.

Reproductive: Unremarkable prostate gland and seminal vesicles

Other: No free air. Scattered minimal free fluid. Tiny umbilical
hernia containing fat.

Musculoskeletal: No acute osseous findings
IMPRESSION: Increased size of pancreas since prior study with increased
peripancreatic inflammatory changes consistent with worsening of
pancreatitis.

Greater enlargement and low-attenuation of the pancreatic tail
versus previous exam, either representing increased pancreatic edema
or developing pancreatic necrosis.

Nonocclusive thrombus within portal vein.

No discrete drainable fluid collection identified.

Associated wall thickening of stomach and descending colon/splenic
flexure.

Cholelithiasis.

Fatty infiltration of liver with focal sparing adjacent to
gallbladder fossa.

Small bibasilar pleural effusions.

Findings called to Dr. LEONELITA On [DATE] at [6H] hours.

## 2020-04-11 MED ORDER — POTASSIUM CHLORIDE 10 MEQ/100ML IV SOLN
10.0000 meq | INTRAVENOUS | Status: AC
  Administered 2020-04-11 (×2): 10 meq via INTRAVENOUS
  Filled 2020-04-11 (×2): qty 100

## 2020-04-11 MED ORDER — POTASSIUM CHLORIDE CRYS ER 20 MEQ PO TBCR
40.0000 meq | EXTENDED_RELEASE_TABLET | ORAL | Status: DC
  Administered 2020-04-11: 40 meq via ORAL
  Filled 2020-04-11: qty 2

## 2020-04-11 MED ORDER — OXYCODONE HCL 5 MG PO TABS
5.0000 mg | ORAL_TABLET | ORAL | Status: DC | PRN
  Administered 2020-04-11 – 2020-04-14 (×9): 5 mg via ORAL
  Filled 2020-04-11 (×9): qty 1

## 2020-04-11 MED ORDER — CYCLOBENZAPRINE HCL 5 MG PO TABS
5.0000 mg | ORAL_TABLET | Freq: Three times a day (TID) | ORAL | Status: DC | PRN
  Administered 2020-04-13: 5 mg via ORAL
  Filled 2020-04-11: qty 1

## 2020-04-11 MED ORDER — CHLORDIAZEPOXIDE HCL 5 MG PO CAPS
10.0000 mg | ORAL_CAPSULE | Freq: Every day | ORAL | Status: AC
  Administered 2020-04-11: 10 mg via ORAL
  Filled 2020-04-11: qty 2

## 2020-04-11 MED ORDER — POTASSIUM CHLORIDE IN NACL 20-0.9 MEQ/L-% IV SOLN
INTRAVENOUS | Status: DC
  Filled 2020-04-11 (×9): qty 1000

## 2020-04-11 MED ORDER — IOHEXOL 300 MG/ML  SOLN
100.0000 mL | Freq: Once | INTRAMUSCULAR | Status: AC | PRN
  Administered 2020-04-11: 100 mL via INTRAVENOUS

## 2020-04-11 MED ORDER — HYDROMORPHONE HCL 1 MG/ML IJ SOLN
0.5000 mg | INTRAMUSCULAR | Status: DC | PRN
  Administered 2020-04-12 – 2020-04-14 (×17): 0.5 mg via INTRAVENOUS
  Filled 2020-04-11 (×17): qty 0.5

## 2020-04-11 NOTE — Progress Notes (Signed)
PROGRESS NOTE  Peter [name] ZDG:[id number] DOB: 1980/10/16   PCP: Kallie Locks, FNP  Patient is from: Home  DOA: 04/04/2020 LOS: 7  Chief complaints: Abdominal pain  Brief Narrative / Interim history: 40 year old M with PMH of alcohol abuse, HTN, carditis, depression and anxiety presenting with 3 to 4 days of abdominal pain and nausea and admitted to ICU for alcohol withdrawal and severe sepsis due to possible aspiration pneumonia.  He was treated with Precedex and IV antibiotics.  Withdrawal symptoms improved, and he was transferred to [facility] service on 04/08/2020.  Patient had persistent pain and leukocytosis although his lipase normalized.  Repeat CT abdomen and pelvis showed with worsening pancreatitis with possible necrosis in pancreatic tail but no pseudocyst.  CT also showed cholelithiasis but no choledocholithiasis.  [facility], Dr. Ewing Schlein consulted and recommended MRCP.   Subjective: Seen and examined earlier this morning and this afternoon.  Continues to endorse abdominal pain across upper abdomen.  Rates his pain 6-7/10.  Also worse at night.  Also reports bilateral back pain.  Denies nausea or vomiting.  Reports regular bowel movements.  Denies chest pain or dyspnea.  Objective: Vitals:   04/10/20 1400 04/10/20 2003 04/10/20 2010 04/11/20 0524  BP: (!) 149/83 131/81  131/84  Pulse: 100 95 93 98  Resp: 17 18  20   Temp: 98.7 F (37.1 C) 98.1 F (36.7 C)  98.3 F (36.8 C)  TempSrc: Oral Oral  Oral  SpO2: 90% 93%  96%  Weight:      Height:        Intake/Output Summary (Last 24 hours) at 04/11/2020 1454 Last data filed at 04/10/2020 1753 Gross per 24 hour  Intake 120 ml  Output -  Net 120 ml   Filed Weights   04/04/20 0004  Weight: 103.4 kg    Examination:  GENERAL: No apparent distress.  Nontoxic. HEENT: MMM.  Vision and hearing grossly intact.  NECK: Supple.  No apparent JVD.  RESP: On RA.  No IWOB.  Fair aeration bilaterally. CVS:  RRR. Heart sounds  normal.  ABD/GI/GU: BS+. Abd soft.  Diffuse tenderness across upper abdomen.  No rebound or guarding. MSK/EXT:  Moves extremities. No apparent deformity. No edema.  SKIN: no apparent skin lesion or wound NEURO: Awake, alert and oriented appropriately.  No apparent focal neuro deficit. PSYCH: Calm. Normal affect.  Procedures:  None  Microbiology summarized: Influenza and COVID-19 PCR nonreactive.  Assessment & Plan: Acute pancreatitis-likely due to alcohol.  However, he also has cholelithiasis on CT. -Lipase initially normalized, now up to 63.  Persistent abdominal pain radiating to his back.. -Repeat CTwith worsening pancreatitis with possible necrosis in pancreatic tail, and cholelithiasis. -N.p.o. except sips with meds -IV fluid, IV analgesics -Follow MRCP -[facility] to see patient on 2/21.  Acute respiratory failure with hypoxia-resolved. Severe sepsis due to LLL aspiration pneumonia-completed 5 days of IV CTX. Sleep Related Hypoxemia -OOB/PT/OT/incentive spirometry -Continue as needed duonebs as needed -PCCM to arrange outpatient sleep study but wants to be notified prior to discharge  Alcohol abuse with withdrawal-resolved. Encephalopathy/agitation:  Resolved. -Complete Librium taper -Counseled on alcohol cessation.  Hypokalemia/hypomagnesemia/hypophosphatemia-due to alcohol? -Replenish and recheck.  Abdominal pain/ileus-mild.  Having BMs. -Mobilize. -OOB/PT/OT -As needed MiraLAX  Uncontrolled hypertension/sinus tachycardia-due to alcohol withdrawal?  Resolved. -Continue home amlodipine, clonidine and Coreg -Resume home losartan at reduced dose -Outpatient sleep study as above  Acute Kidney Injury/hyperkalemia/hyponatremia-old due to alcohol.  Resolved. -IV fluid as above -Encourage oral intake  Anxiety/depression -Continue home  Effexor and BuSpar.  Leukocytosis: WBC up to 19.6.  Due to pancreatitis?  He just completed 5 days of CTX for aspiration  pneumonia. -Continue monitoring  Thrombocytopenia: Resolved.  Debility/physical deconditioning-likely due to acute illness.  Improving.  Obesity Body mass index is 32.71 kg/m.         DVT prophylaxis:  On subcu Lovenox  Code Status: Full code Family Communication: Updated patient's mother at bedside. Level of care: Telemetry Status is: Inpatient  Remains inpatient appropriate because:Hemodynamically unstable, Persistent severe electrolyte disturbances, Unsafe d/c plan, IV treatments appropriate due to intensity of illness or inability to take PO and Inpatient level of care appropriate due to severity of illness   Dispo: The patient is from: Home              Anticipated d/c is to: Home              Anticipated d/c date is: 3 days              Patient currently is not medically stable to d/c.   Difficult to place patient No       Consultants:  PCCM-signed off Gastroenterology   Sch Meds:  Scheduled Meds: . acetaminophen  1,000 mg Oral Q8H  . amLODipine  10 mg Oral Daily  . busPIRone  7.5 mg Oral TID  . carvedilol  6.25 mg Oral BID WC  . chlorhexidine  15 mL Mouth Rinse BID  . Chlorhexidine Gluconate Cloth  6 each Topical Daily  . cloNIDine  0.2 mg Oral BID  . folic acid  1 mg Oral Daily  . losartan  25 mg Oral Daily  . mouth rinse  15 mL Mouth Rinse q12n4p  . mouth rinse  15 mL Mouth Rinse BID  . multivitamin with minerals  1 tablet Oral Daily  . nicotine  14 mg Transdermal Daily  . pantoprazole  40 mg Oral BID  . potassium chloride  40 mEq Oral Q4H  . sodium chloride flush  3 mL Intravenous Q12H  . thiamine  100 mg Oral Daily   Or  . thiamine  100 mg Intravenous Daily  . venlafaxine XR  75 mg Oral Q breakfast   Continuous Infusions: . 0.9 % NaCl with KCl 20 mEq / L 100 mL/hr at 04/11/20 1451   PRN Meds:.albuterol, cyclobenzaprine, HYDROmorphone (DILAUDID) injection, labetalol, ondansetron **OR** ondansetron (ZOFRAN) IV, oxyCODONE, polyethylene  glycol  Antimicrobials: Anti-infectives (From admission, onward)   Start     Dose/Rate Route Frequency Ordered Stop   04/06/20 1300  cefTRIAXone (ROCEPHIN) 2 g in sodium chloride 0.9 % 100 mL IVPB  Status:  Discontinued        2 g 200 mL/hr over 30 Minutes Intravenous Every 24 hours 04/06/20 1202 04/10/20 1510       I have personally reviewed the following labs and images: CBC: Recent Labs  Lab 04/07/20 1034 04/08/20 0533 04/09/20 0237 04/10/20 0616 04/11/20 0551  WBC 19.4* 20.9* 15.8* 17.7* 19.6*  NEUTROABS  --   --  11.8*  --  16.2*  HGB 12.8* 12.9* 11.7* 11.5* 11.5*  HCT 37.8* 38.6* 35.9* 34.2* 34.3*  MCV 96.7 97.0 98.9 96.6 96.3  PLT 145* 147* 167 201 282   BMP &GFR Recent Labs  Lab 04/05/20 0334 04/05/20 1139 04/07/20 1034 04/08/20 0533 04/09/20 0237 04/10/20 0616 04/11/20 0551  NA 130*   < > 136 136 137 137 135  K 6.2*   < > 3.8 3.8 4.0 3.2* 3.2*  CL 97*   < > 99 98 100 98 98  CO2 21*   < > GLUCOSE 184*   < > 157* 190* 179* 207* 199*  BUN 18   < > CREATININE 0.96   < > 1.01 0.79 0.68 0.70 0.72  0.74  CALCIUM 8.2*   < > 8.6* 8.5* 8.4* 8.2* 8.3*  MG 2.0  --   --  1.9 2.0 1.8 2.0  PHOS 2.2*  --   --  3.4 2.3* 2.0* 2.6   < > = values in this interval not displayed.   Estimated Creatinine Clearance: 149.4 mL/min (by C-G formula based on SCr of 0.74 mg/dL). Liver & Pancreas: Recent Labs  Lab 04/05/20 0334 04/06/20 0750 04/07/20 1034 04/08/20 0533 04/09/20 0237 04/10/20 0616 04/11/20 0551  AST 69* --   --  22  ALT 59* 32 26 26  --   --  25  ALKPHOS 48 51 73 88  --   --  77  BILITOT 3.3* 1.3* 1.5* 1.4*  --   --  1.0  PROT 6.3* 6.0* 7.2 7.4  --   --  7.0  ALBUMIN 3.1* 2.6* 2.9* 3.0* 2.7* 2.6* 2.7*   Recent Labs  Lab 04/05/20 0334 04/07/20 1034 04/08/20 1851 04/11/20 0938  LIPASE 200* 31 35 63*   No results for input(s): AMMONIA in the last 168 hours. Diabetic: No results for input(s): HGBA1C in the  last 72 hours. Recent Labs  Lab 04/10/20 1933 04/10/20 2337 04/11/20 0328 04/11/20 0801 04/11/20 1249  GLUCAP 198* 215* 187* 192* 153*   Cardiac Enzymes: No results for input(s): CKTOTAL, CKMB, CKMBINDEX, TROPONINI in the last 168 hours. No results for input(s): PROBNP in the last 8760 hours. Coagulation Profile: No results for input(s): INR, PROTIME in the last 168 hours. Thyroid Function Tests: Recent Labs    04/11/20 0551  TSH 3.460   Lipid Profile: No results for input(s): CHOL, HDL, LDLCALC, TRIG, CHOLHDL, LDLDIRECT in the last 72 hours. Anemia Panel: No results for input(s): VITAMINB12, FOLATE, FERRITIN, TIBC, IRON, RETICCTPCT in the last 72 hours. Urine analysis:    Component Value Date/Time   COLORURINE AMBER (A) 04/04/2020 2354   APPEARANCEUR CLEAR 04/04/2020 2354   LABSPEC 1.025 04/04/2020 2354   PHURINE 5.0 04/04/2020 2354   GLUCOSEU 50 (A) 04/04/2020 2354   HGBUR SMALL (A) 04/04/2020 2354   BILIRUBINUR NEGATIVE 04/04/2020 2354   BILIRUBINUR Negative 01/28/2019 1521   KETONESUR 5 (A) 04/04/2020 2354   PROTEINUR 100 (A) 04/04/2020 2354   UROBILINOGEN 0.2 01/28/2019 1521   UROBILINOGEN 0.2 11/13/2016 1234   NITRITE NEGATIVE 04/04/2020 2354   LEUKOCYTESUR NEGATIVE 04/04/2020 2354   Sepsis Labs: Invalid input(s): PROCALCITONIN, LACTICIDVEN  Microbiology: Recent Results (from the past 240 hour(s))  Resp Panel by RT-PCR (Flu A&B, Covid) Nasopharyngeal Swab     Status: None   Collection Time: 04/04/20  6:49 AM   Specimen: Nasopharyngeal Swab; Nasopharyngeal(NP) swabs in vial transport medium  Result Value Ref Range Status   SARS Coronavirus 2 by RT PCR NEGATIVE NEGATIVE Final    Comment: (NOTE) SARS-CoV-2 target nucleic acids are NOT DETECTED.  The SARS-CoV-2 RNA is generally detectable in upper respiratory specimens during the acute phase of infection. The lowest concentration of SARS-CoV-2 viral copies this assay can detect is 138 copies/mL. A negative  result does not preclude SARS-Cov-2 infection and should not be used as the  sole basis for treatment or other patient management decisions. A negative result may occur with  improper specimen collection/handling, submission of specimen other than nasopharyngeal swab, presence of viral mutation(s) within the areas targeted by this assay, and inadequate number of viral copies(<138 copies/mL). A negative result must be combined with clinical observations, patient history, and epidemiological information. The expected result is Negative.  Fact Sheet for Patients:  BloggerCourse.com  Fact Sheet for Healthcare Providers:  SeriousBroker.it  This test is no t yet approved or cleared by the [place] FDA and  has been authorized for detection and/or diagnosis of SARS-CoV-2 by FDA under an Emergency Use Authorization (EUA). This EUA will remain  in effect (meaning this test can be used) for the duration of the COVID-19 declaration under Section 564(b)(1) of the Act, 21 U.S.C.section 360bbb-3(b)(1), unless the authorization is terminated  or revoked sooner.       Influenza A by PCR NEGATIVE NEGATIVE Final   Influenza B by PCR NEGATIVE NEGATIVE Final    Comment: (NOTE) The Xpert Xpress SARS-CoV-2/FLU/RSV plus assay is intended as an aid in the diagnosis of influenza from Nasopharyngeal swab specimens and should not be used as a sole basis for treatment. Nasal washings and aspirates are unacceptable for Xpert Xpress SARS-CoV-2/FLU/RSV testing.  Fact Sheet for Patients: BloggerCourse.com  Fact Sheet for Healthcare Providers: SeriousBroker.it  This test is not yet approved or cleared by the [place] FDA and has been authorized for detection and/or diagnosis of SARS-CoV-2 by FDA under an Emergency Use Authorization (EUA). This EUA will remain in effect (meaning this test can be used)  for the duration of the COVID-19 declaration under Section 564(b)(1) of the Act, 21 U.S.C. section 360bbb-3(b)(1), unless the authorization is terminated or revoked.  Performed at [facility], [address], [place], [place] [address]   MRSA PCR Screening     Status: None   Collection Time: 04/04/20  5:19 PM   Specimen: Nasal Mucosa; Nasopharyngeal  Result Value Ref Range Status   MRSA by PCR NEGATIVE NEGATIVE Final    Comment:        The GeneXpert MRSA Assay (FDA approved for NASAL specimens only), is one component of a comprehensive MRSA colonization surveillance program. It is not intended to diagnose MRSA infection nor to guide or monitor treatment for MRSA infections. Performed at [facility], [address] [place], [place] [address]     Radiology Studies: CT ABDOMEN PELVIS W CONTRAST  Result Date: 04/11/2020 CLINICAL DATA:  Acute abdominal pain and now with some back pain, follow-up pancreatitis EXAM: CT ABDOMEN AND PELVIS WITH CONTRAST TECHNIQUE: Multidetector CT imaging of the abdomen and pelvis was performed using the standard protocol following bolus administration of intravenous contrast. Sagittal and coronal MPR images reconstructed from axial data set. CONTRAST:  OMNIPAQUE IOHEXOL 300 MG/ML SOLN IV. No oral contrast. COMPARISON:  04/04/2020 FINDINGS: Lower chest: Small bibasilar pleural effusions with compressive atelectasis of LEFT lower lobe. Hepatobiliary: Fatty infiltration of liver. Dependent tiny calculi within gallbladder. No focal hepatic mass or definite biliary dilatation. Focal sparing adjacent to gallbladder fossa. Pancreas: Enlarged edematous pancreas with extensive surrounding inflammatory changes of consistent with acute pancreatitis. Pancreatic tail demonstrates low-attenuation at L lack of enhancement versus prior study, could represent severe edema or pancreatic necrosis. No ductal dilatation or pancreatic  hemorrhage. No new pancreatic fluid collections. Increased pancreatic edema along anterior pararenal fascia into LEFT lateral conal fascia and adjacent to duodenum. Spleen: Normal appearance Adrenals/Urinary  Tract: Adrenal glands, kidneys, ureters, and bladder normal appearance Stomach/Bowel: Mild gastric wall thickening. Thickening of splenic flexure of colon and descending colon. Remaining bowel loops unremarkable. Appendix not identified. Vascular/Lymphatic: Small filling defect is seen centrally within the portal vein consistent with nonocclusive portal thrombosis. Remaining vascular structures patent. Scattered normal size mesenteric lymph nodes. Reproductive: Unremarkable prostate gland and seminal vesicles Other: No free air. Scattered minimal free fluid. Tiny umbilical hernia containing fat. Musculoskeletal: No acute osseous findings IMPRESSION: Increased size of pancreas since prior study with increased peripancreatic inflammatory changes consistent with worsening of pancreatitis. Greater enlargement and low-attenuation of the pancreatic tail versus previous exam, either representing increased pancreatic edema or developing pancreatic necrosis. Nonocclusive thrombus within portal vein. No discrete drainable fluid collection identified. Associated wall thickening of stomach and descending colon/splenic flexure. Cholelithiasis. Fatty infiltration of liver with focal sparing adjacent to gallbladder fossa. Small bibasilar pleural effusions. Findings called to Dr. Alanda SlimGonfa On 04/11/2020 at 1257 hours. Electronically Signed   By: Ulyses SouthwardMark  Boles M.D.   On: 04/11/2020 13:21       Taye T. Gonfa [facility]  If 7PM-7AM, please contact night-coverage [email or web address] 04/11/2020, 2:54 PM

## 2020-04-12 ENCOUNTER — Inpatient Hospital Stay (HOSPITAL_COMMUNITY): Payer: Self-pay

## 2020-04-12 LAB — CBC WITH DIFFERENTIAL/PLATELET
Abs Immature Granulocytes: 0.68 10*3/uL — ABNORMAL HIGH (ref 0.00–0.07)
Basophils Absolute: 0.1 10*3/uL (ref 0.0–0.1)
Basophils Relative: 1 %
Eosinophils Absolute: 0.1 10*3/uL (ref 0.0–0.5)
Eosinophils Relative: 0 %
HCT: 34.2 % — ABNORMAL LOW (ref 39.0–52.0)
Hemoglobin: 11.4 g/dL — ABNORMAL LOW (ref 13.0–17.0)
Immature Granulocytes: 4 %
Lymphocytes Relative: 6 %
Lymphs Abs: 1 10*3/uL (ref 0.7–4.0)
MCH: 32.1 pg (ref 26.0–34.0)
MCHC: 33.3 g/dL (ref 30.0–36.0)
MCV: 96.3 fL (ref 80.0–100.0)
Monocytes Absolute: 1.3 10*3/uL — ABNORMAL HIGH (ref 0.1–1.0)
Monocytes Relative: 7 %
Neutro Abs: 15 10*3/uL — ABNORMAL HIGH (ref 1.7–7.7)
Neutrophils Relative %: 82 %
Platelets: 308 10*3/uL (ref 150–400)
RBC: 3.55 MIL/uL — ABNORMAL LOW (ref 4.22–5.81)
RDW: 14.9 % (ref 11.5–15.5)
WBC: 18.1 10*3/uL — ABNORMAL HIGH (ref 4.0–10.5)
nRBC: 0 % (ref 0.0–0.2)

## 2020-04-12 LAB — COMPREHENSIVE METABOLIC PANEL
ALT: 21 U/L (ref 0–44)
AST: 20 U/L (ref 15–41)
Albumin: 2.6 g/dL — ABNORMAL LOW (ref 3.5–5.0)
Alkaline Phosphatase: 70 U/L (ref 38–126)
Anion gap: 10 (ref 5–15)
BUN: 7 mg/dL (ref 6–20)
CO2: 24 mmol/L (ref 22–32)
Calcium: 8.3 mg/dL — ABNORMAL LOW (ref 8.9–10.3)
Chloride: 102 mmol/L (ref 98–111)
Creatinine, Ser: 0.62 mg/dL (ref 0.61–1.24)
GFR, Estimated: >60 mL/min (ref >60)
Glucose, Bld: 177 mg/dL — ABNORMAL HIGH (ref 70–99)
Potassium: 3.7 mmol/L (ref 3.5–5.1)
Sodium: 136 mmol/L (ref 135–145)
Total Bilirubin: 1 mg/dL (ref 0.3–1.2)
Total Protein: 6.6 g/dL (ref 6.5–8.1)

## 2020-04-12 LAB — GLUCOSE, CAPILLARY
Glucose-Capillary: 144 mg/dL — ABNORMAL HIGH (ref 70–99)
Glucose-Capillary: 149 mg/dL — ABNORMAL HIGH (ref 70–99)
Glucose-Capillary: 150 mg/dL — ABNORMAL HIGH (ref 70–99)
Glucose-Capillary: 157 mg/dL — ABNORMAL HIGH (ref 70–99)
Glucose-Capillary: 174 mg/dL — ABNORMAL HIGH (ref 70–99)
Glucose-Capillary: 196 mg/dL — ABNORMAL HIGH (ref 70–99)

## 2020-04-12 LAB — MAGNESIUM: Magnesium: 1.8 mg/dL (ref 1.7–2.4)

## 2020-04-12 LAB — LIPASE, BLOOD: Lipase: 55 U/L — ABNORMAL HIGH (ref 11–51)

## 2020-04-12 LAB — PHOSPHORUS: Phosphorus: 2.9 mg/dL (ref 2.5–4.6)

## 2020-04-12 IMAGING — MR MR 3D RECON AT [facility]NER
18 of 21 series · 42 of 48 positions shown · IV contrast (gadavist)
Comparison: [DATE] CT abdomen/pelvis.

CLINICAL DATA: Inpatient.  Acute pancreatitis.  Alcohol abuse.

EXAM:
MRI ABDOMEN WITHOUT AND WITH CONTRAST (INCLUDING MRCP)
TECHNIQUE: Multiplanar multisequence MR imaging of the abdomen was performed
both before and after the administration of intravenous contrast.
Heavily T2-weighted images of the biliary and pancreatic ducts were
obtained, and three-dimensional MRCP images were rendered by post
processing.
CONTRAST:  10mL GADAVIST GADOBUTROL 1 MMOL/ML IV SOLN

[Series 3: T2 fat-sat · axial · 6.0mm · 1.25mm/px · 1 of 46 slices shown]
[im 1/46]
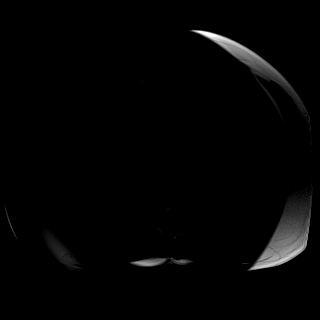

[Series 6: cor_3d_spc_trig · coronal · 1.2mm · 0.49mm/px · 2 of 72 slices shown]
[im 1/72]
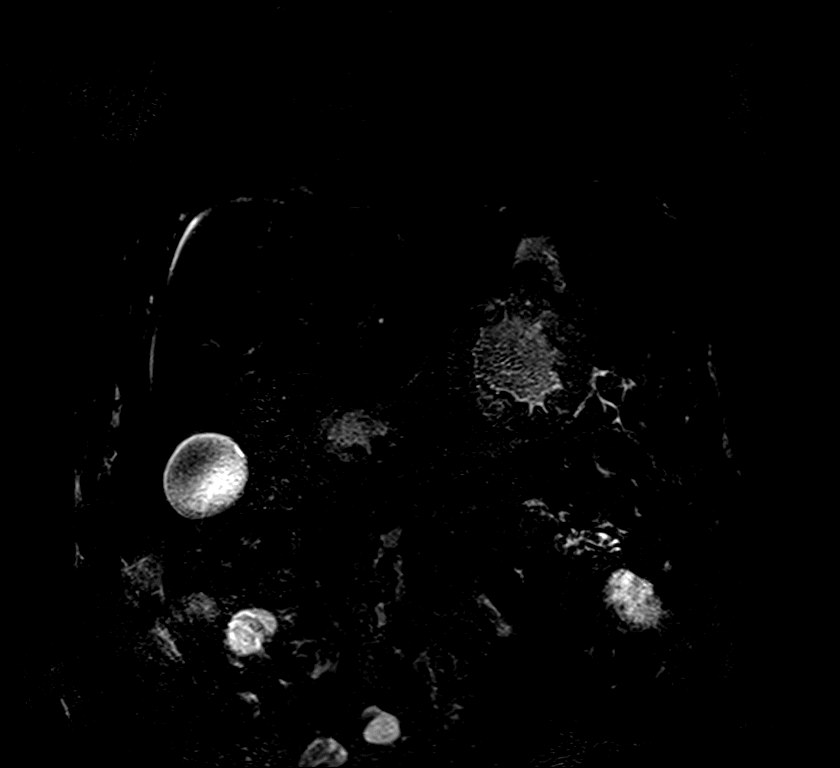
[im 72/72]
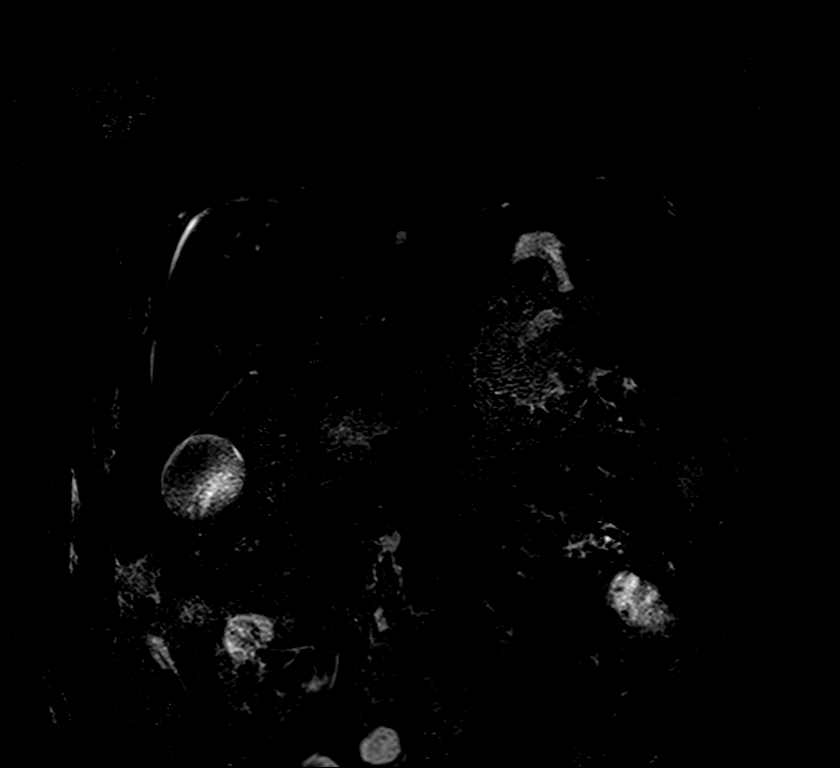

[Series 8: T2 · coronal · 6.0mm · 1.68mm/px · 1 of 42 slices shown (1 of 2)]
[im 1/42]
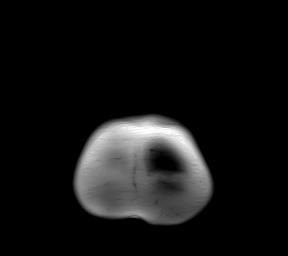

[Series 10: T1 · axial · 3.4mm · 1.68mm/px · z∈[-174,+149]mm · 3 of 96 slices shown (1 of 2)]
[im 1/96]
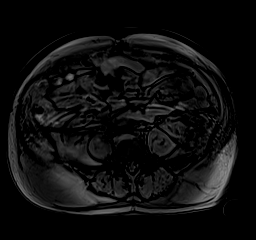
[im 48/96]
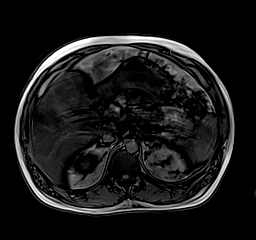
[im 96/96]
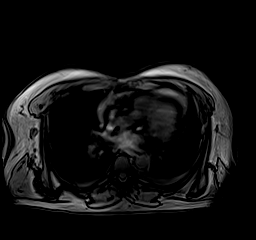

[Series 11: T1 · axial · 3.4mm · 1.68mm/px · z∈[-174,+149]mm · 3 of 96 slices shown (2 of 2)]
[im 1/96]
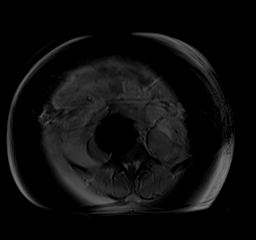
[im 48/96]
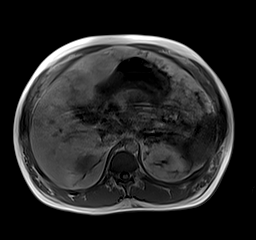
[im 96/96]
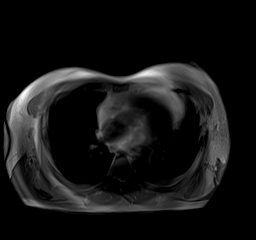

[Series 12: DWI · axial · 6.0mm · 1.49mm/px · z∈[-174,+150]mm · 3 of 92 slices shown (1 of 2)]
[im 1/92]
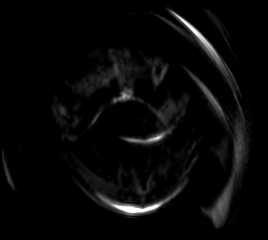
[im 46/92]
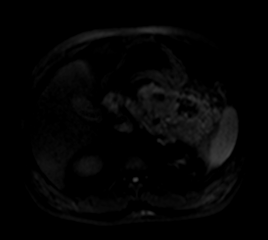
[im 92/92]
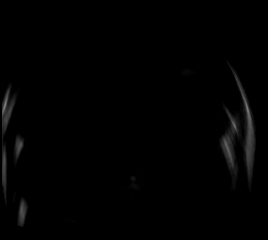

[Series 13: DWI · axial · 6.0mm · 1.49mm/px · 1 of 46 slices shown (2 of 2)]
[im 1/46]
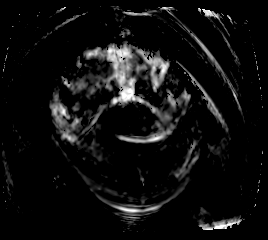

[Series 14: cor obl thk · sagittal · 50.0mm · 0.78mm/px · 1 of 9 slices shown]
[im 1/9]
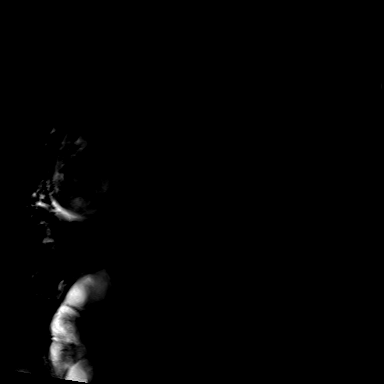

[Series 15: T2 · axial · 6.0mm · 1.68mm/px · 1 of 46 slices shown (2 of 2)]
[im 1/46]
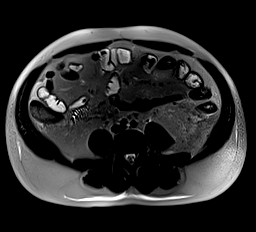

[Series 18: T1 dynamic · axial · 3.0mm · 1.34mm/px · z∈[-145,+164]mm · 3 of 104 slices shown (1 of 6)]
[im 1/104]
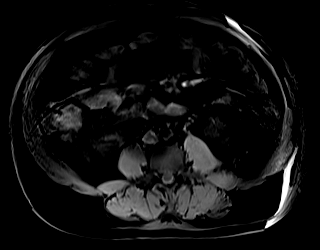
[im 52/104]
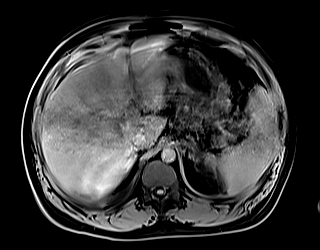
[im 104/104]
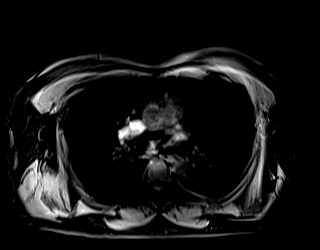

[Series 21: T1 dynamic · axial · 3.0mm · 1.34mm/px · z∈[-145,+164]mm · 3 of 104 slices shown (2 of 6)]
[im 1/104]
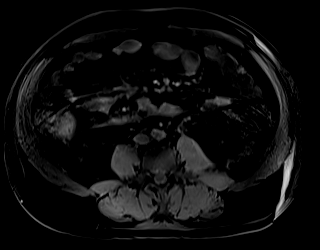
[im 52/104]
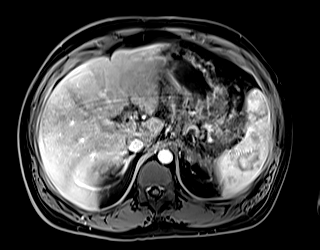
[im 104/104]
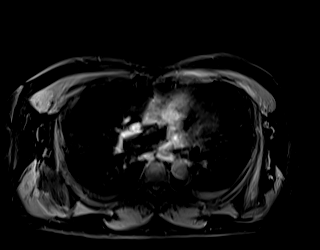

[Series 23: T1 dynamic · axial · 3.0mm · 1.34mm/px · z∈[-145,+164]mm · 3 of 104 slices shown (3 of 6)]
[im 1/104]
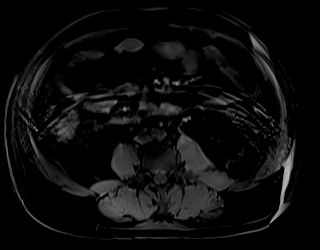
[im 52/104]
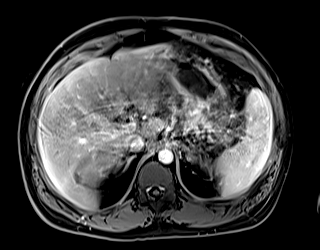
[im 104/104]
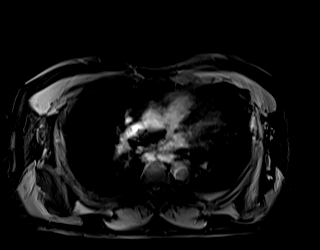

[Series 25: T1 dynamic · axial · 3.0mm · 1.34mm/px · z∈[-145,+164]mm · 3 of 104 slices shown (4 of 6)]
[im 1/104]
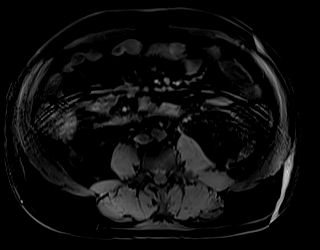
[im 52/104]
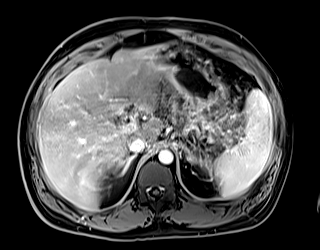
[im 104/104]
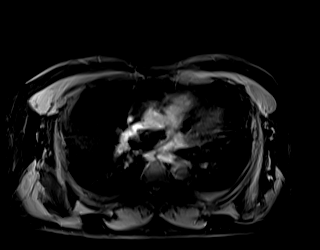

[Series 27: T1 dynamic · coronal · 5.0mm · 1.56mm/px · 2 of 64 slices shown (5 of 6)]
[im 1/64]
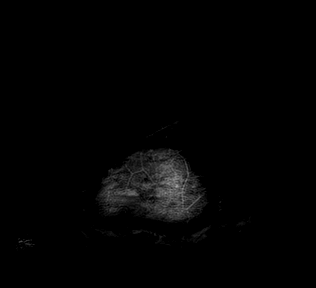
[im 64/64]
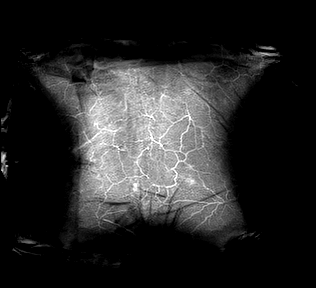

[Series 29: T1 dynamic · axial · 3.0mm · 1.34mm/px · z∈[-145,+164]mm · 3 of 104 slices shown (6 of 6)]
[im 1/104]
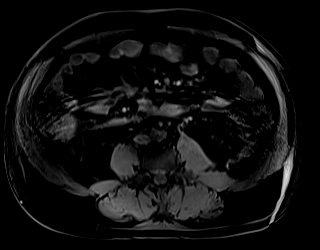
[im 52/104]
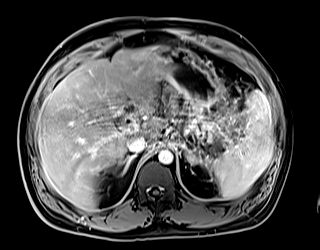
[im 104/104]
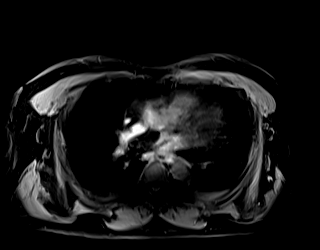

[Series 102: sub_20 sec · axial · 3.0mm · 1.34mm/px · z∈[-145,+164]mm · 3 of 104 slices shown]
[im 1/104]
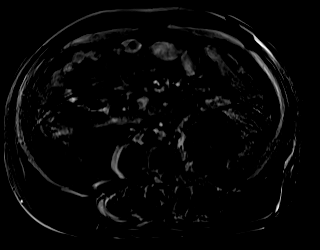
[im 52/104]
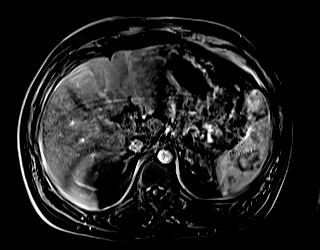
[im 104/104]
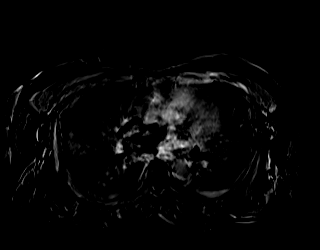

[Series 103: sub_45 sec · axial · 3.0mm · 1.34mm/px · z∈[-145,+164]mm · 3 of 104 slices shown]
[im 1/104]
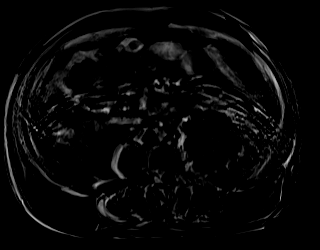
[im 52/104]
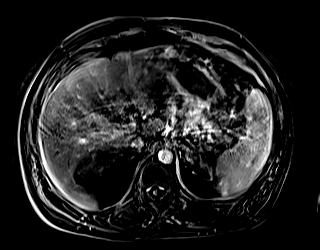
[im 104/104]
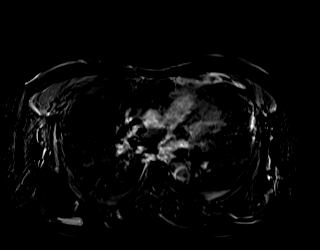

[Series 104: sub_90 sec · axial · 3.0mm · 1.34mm/px · z∈[-145,+164]mm · 3 of 104 slices shown]
[im 1/104]
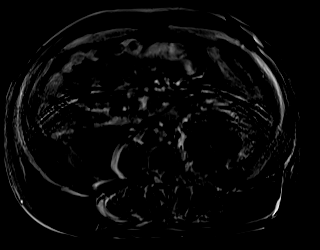
[im 52/104]
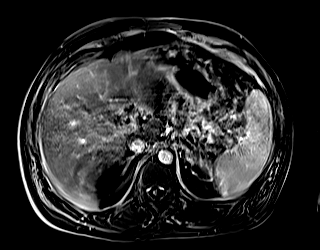
[im 104/104]
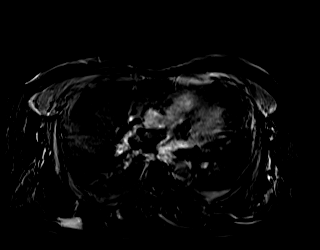

[42 of 48 positions shown; findings below may reference images not displayed]

FINDINGS: Lower chest: Small dependent bilateral pleural effusions, unchanged
from CT study from 1 day prior.

Hepatobiliary: Top-normal liver size. No liver surface irregularity.
Moderate diffuse hepatic steatosis. Subcentimeter simple segment 6
right liver cyst. No suspicious liver masses. A few tiny layering
2-3 mm gallstones in the otherwise normal gallbladder with no
gallbladder wall thickening or pericholecystic fluid. No biliary
ductal dilatation. Common bile duct diameter 4 mm. No
choledocholithiasis. No biliary masses, strictures or beading.

Pancreas: There is diffuse expansion of pancreas, most prominent in
the pancreatic tail. There is abnormal mixed signal intensity
throughout the expanded pancreatic tail with areas of patchy
pre-contrast T1 hyperintensity. There is absence of parenchymal
enhancement throughout much of the pancreatic tail. Findings are
compatible with acute hemorrhagic necrotizing pancreatitis. There
are similar less prominent regions of absent parenchymal enhancement
and mixed signal intensity in pancreatico-duodenal groove in the
pancreatic head also compatible with necrotizing pancreatitis.
Overall, approximately 40% of pancreatic parenchyma appears
necrotic. No discrete pancreatic mass. No pancreatic duct dilation.
No pancreas divisum. A few small ill-defined peripancreatic
inflammatory fluid collections are identified, largest 4.2 x 2.9 x
3.0 cm at the inferior margin of the pancreatic tail (series 3/image
30).

Spleen: Top normal size spleen.  No splenic mass.

Adrenals/Urinary Tract: Normal adrenals. No hydronephrosis. Simple
subcentimeter lower left renal cortical cyst. Otherwise normal
kidneys, with no suspicious renal masses.

Stomach/Bowel: Normal non-distended stomach. Visualized small and
large bowel is normal caliber, with no bowel wall thickening.

Vascular/Lymphatic: Normal caliber abdominal aorta. Nonocclusive
thrombosis of the main portal vein (series 23/image 62). Splenic
vein appears occluded. Patent hepatic and renal veins. No
pathologically enlarged lymph nodes in the abdomen.

Other: No abdominal ascites.

Musculoskeletal: No aggressive appearing focal osseous lesions.
IMPRESSION: 1. Acute hemorrhagic necrotizing pancreatitis predominantly
involving the pancreatic tail and pancreatico-duodenal groove
(approximately 40% pancreatic parenchymal necrosis). Small
ill-defined peripancreatic inflammatory fluid collections, largest
4.2 x 2.9 x 3.0 cm at the inferior margin of the pancreatic tail. No
pancreatic duct dilation.
2. Nonocclusive thrombosis of the main portal vein. Occluded splenic
vein.
3. Moderate diffuse hepatic steatosis.
4. Cholelithiasis. No biliary ductal dilatation. No
choledocholithiasis.
5. Small dependent bilateral pleural effusions, unchanged from CT
study from 1 day prior.

## 2020-04-12 IMAGING — MR MR ABDOMEN WO/W CM MRCP
18 of 21 series · 42 of 48 positions shown · IV contrast (gadavist)
Comparison: [DATE] CT abdomen/pelvis.

CLINICAL DATA: Inpatient.  Acute pancreatitis.  Alcohol abuse.

EXAM:
MRI ABDOMEN WITHOUT AND WITH CONTRAST (INCLUDING MRCP)
TECHNIQUE: Multiplanar multisequence MR imaging of the abdomen was performed
both before and after the administration of intravenous contrast.
Heavily T2-weighted images of the biliary and pancreatic ducts were
obtained, and three-dimensional MRCP images were rendered by post
processing.
CONTRAST:  10mL GADAVIST GADOBUTROL 1 MMOL/ML IV SOLN

[Series 3: T2 fat-sat · axial · 6.0mm · 1.25mm/px · 1 of 46 slices shown]
[im 1/46]
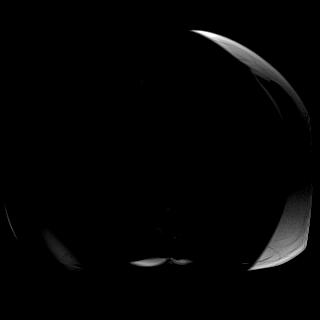

[Series 6: cor_3d_spc_trig · coronal · 1.2mm · 0.49mm/px · 2 of 72 slices shown]
[im 1/72]
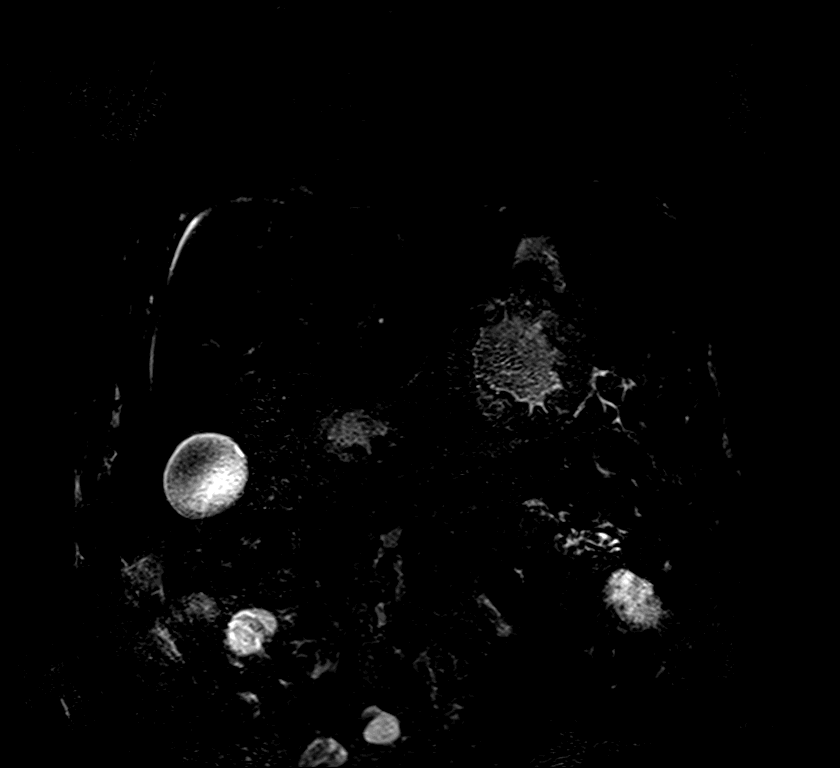
[im 72/72]
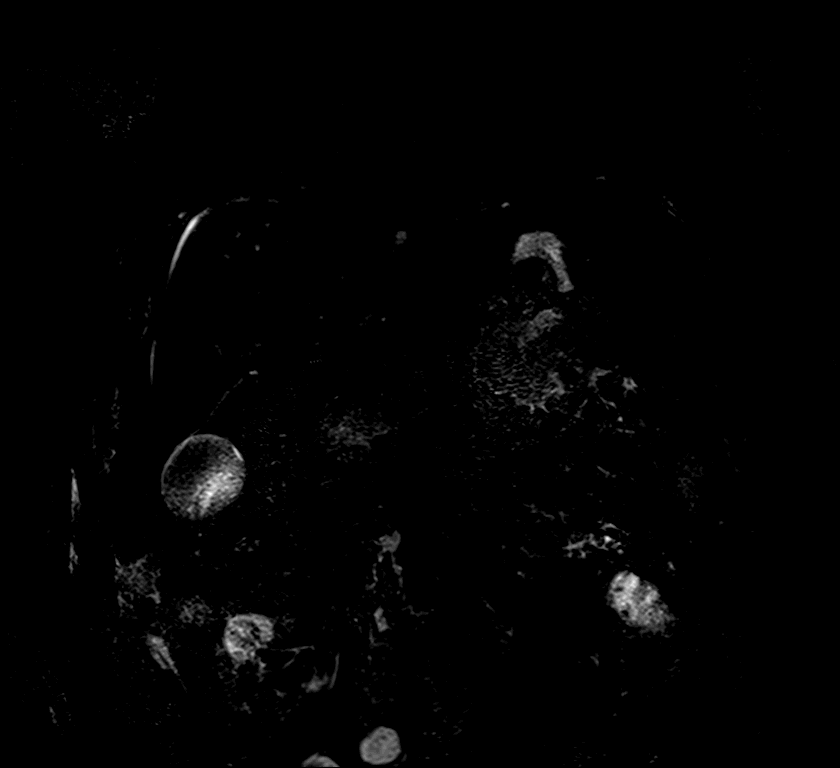

[Series 8: T2 · coronal · 6.0mm · 1.68mm/px · 1 of 42 slices shown (1 of 2)]
[im 1/42]
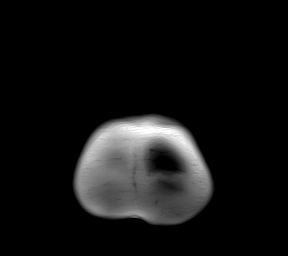

[Series 10: T1 · axial · 3.4mm · 1.68mm/px · z∈[-174,+149]mm · 3 of 96 slices shown (1 of 2)]
[im 1/96]
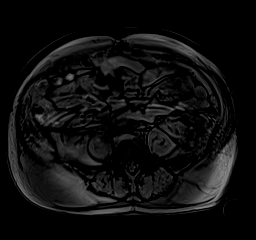
[im 48/96]
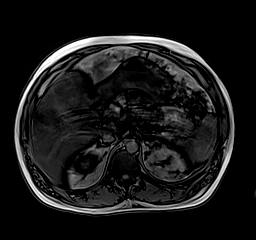
[im 96/96]
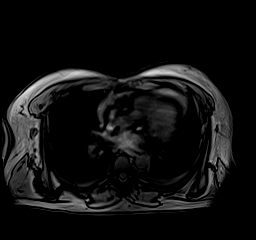

[Series 11: T1 · axial · 3.4mm · 1.68mm/px · z∈[-174,+149]mm · 3 of 96 slices shown (2 of 2)]
[im 1/96]
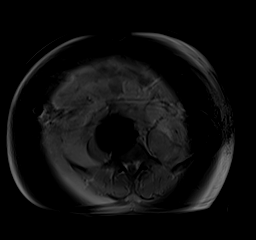
[im 48/96]
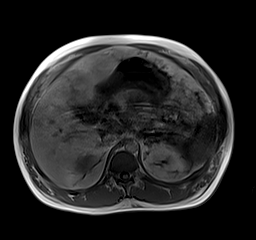
[im 96/96]
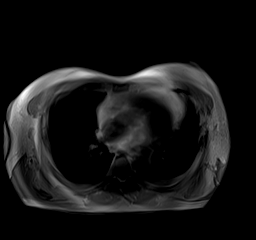

[Series 12: DWI · axial · 6.0mm · 1.49mm/px · z∈[-174,+150]mm · 3 of 92 slices shown (1 of 2)]
[im 1/92]
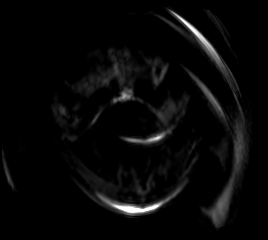
[im 46/92]
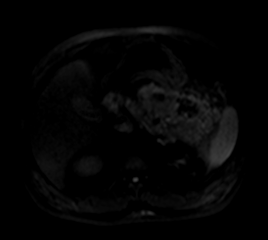
[im 92/92]
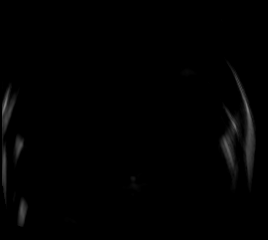

[Series 13: DWI · axial · 6.0mm · 1.49mm/px · 1 of 46 slices shown (2 of 2)]
[im 1/46]
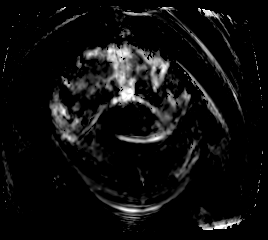

[Series 14: cor obl thk · sagittal · 50.0mm · 0.78mm/px · 1 of 9 slices shown]
[im 1/9]
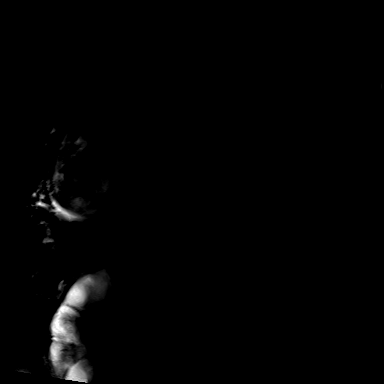

[Series 15: T2 · axial · 6.0mm · 1.68mm/px · 1 of 46 slices shown (2 of 2)]
[im 1/46]
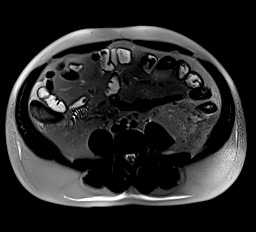

[Series 18: T1 dynamic · axial · 3.0mm · 1.34mm/px · z∈[-145,+164]mm · 3 of 104 slices shown (1 of 6)]
[im 1/104]
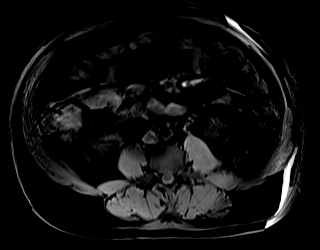
[im 52/104]
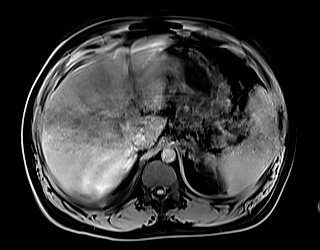
[im 104/104]
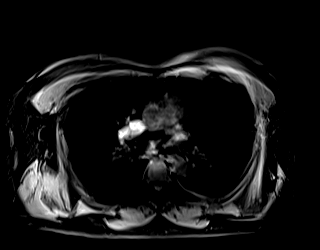

[Series 21: T1 dynamic · axial · 3.0mm · 1.34mm/px · z∈[-145,+164]mm · 3 of 104 slices shown (2 of 6)]
[im 1/104]
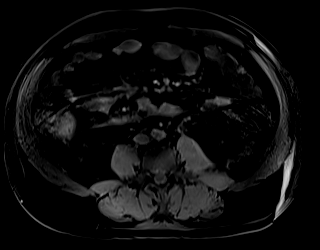
[im 52/104]
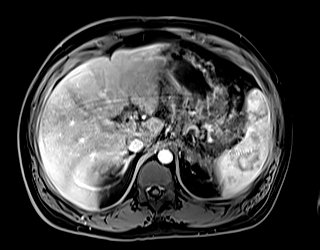
[im 104/104]
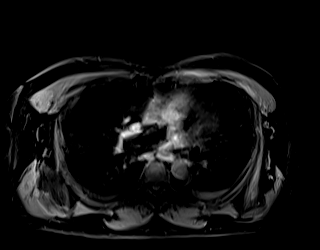

[Series 23: T1 dynamic · axial · 3.0mm · 1.34mm/px · z∈[-145,+164]mm · 3 of 104 slices shown (3 of 6)]
[im 1/104]
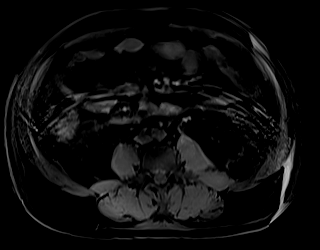
[im 52/104]
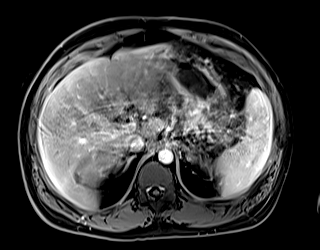
[im 104/104]
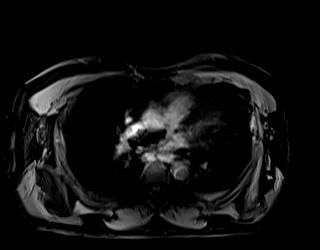

[Series 25: T1 dynamic · axial · 3.0mm · 1.34mm/px · z∈[-145,+164]mm · 3 of 104 slices shown (4 of 6)]
[im 1/104]
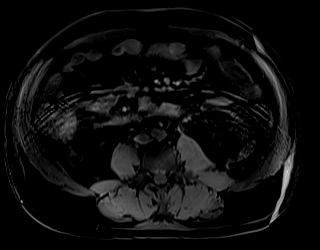
[im 52/104]
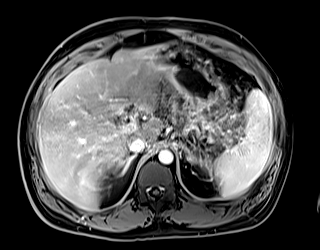
[im 104/104]
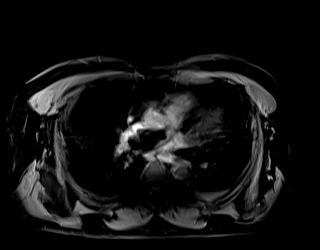

[Series 27: T1 dynamic · coronal · 5.0mm · 1.56mm/px · 2 of 64 slices shown (5 of 6)]
[im 1/64]
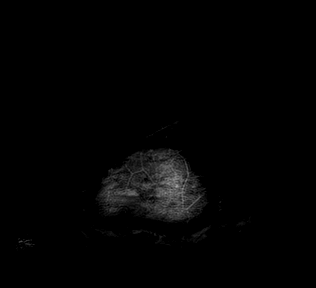
[im 64/64]
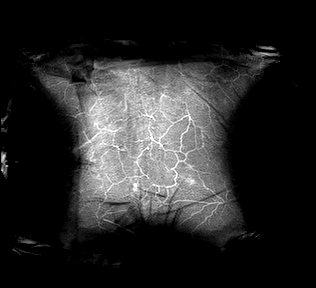

[Series 29: T1 dynamic · axial · 3.0mm · 1.34mm/px · z∈[-145,+164]mm · 3 of 104 slices shown (6 of 6)]
[im 1/104]
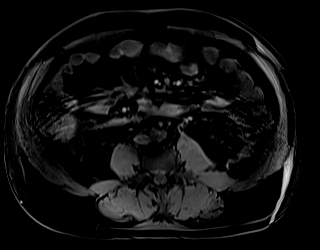
[im 52/104]
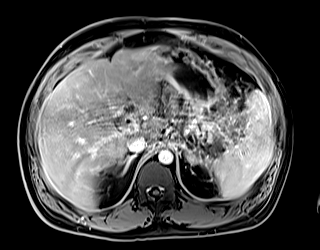
[im 104/104]
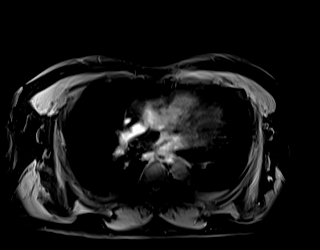

[Series 102: sub_20 sec · axial · 3.0mm · 1.34mm/px · z∈[-145,+164]mm · 3 of 104 slices shown]
[im 1/104]
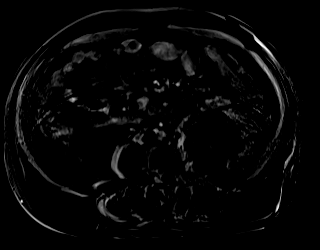
[im 52/104]
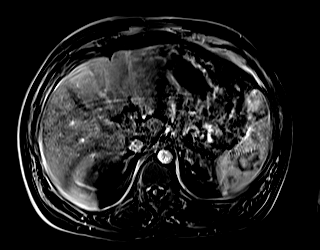
[im 104/104]
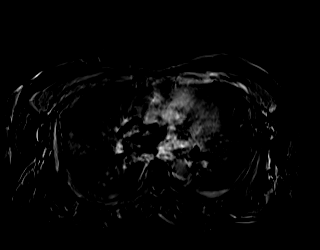

[Series 103: sub_45 sec · axial · 3.0mm · 1.34mm/px · z∈[-145,+164]mm · 3 of 104 slices shown]
[im 1/104]
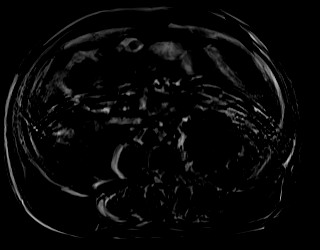
[im 52/104]
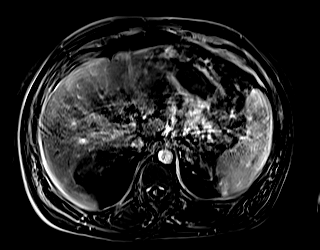
[im 104/104]
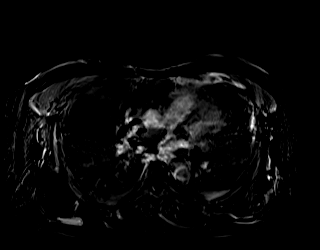

[Series 104: sub_90 sec · axial · 3.0mm · 1.34mm/px · z∈[-145,+164]mm · 3 of 104 slices shown]
[im 1/104]
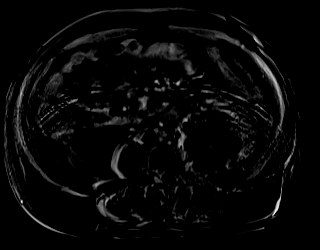
[im 52/104]
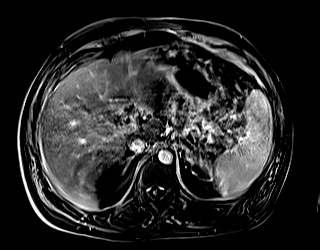
[im 104/104]
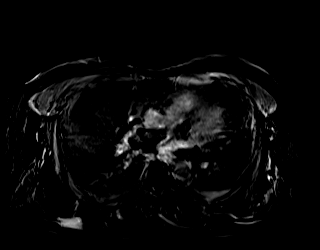

[42 of 48 positions shown; findings below may reference images not displayed]

FINDINGS: Lower chest: Small dependent bilateral pleural effusions, unchanged
from CT study from 1 day prior.

Hepatobiliary: Top-normal liver size. No liver surface irregularity.
Moderate diffuse hepatic steatosis. Subcentimeter simple segment 6
right liver cyst. No suspicious liver masses. A few tiny layering
2-3 mm gallstones in the otherwise normal gallbladder with no
gallbladder wall thickening or pericholecystic fluid. No biliary
ductal dilatation. Common bile duct diameter 4 mm. No
choledocholithiasis. No biliary masses, strictures or beading.

Pancreas: There is diffuse expansion of pancreas, most prominent in
the pancreatic tail. There is abnormal mixed signal intensity
throughout the expanded pancreatic tail with areas of patchy
pre-contrast T1 hyperintensity. There is absence of parenchymal
enhancement throughout much of the pancreatic tail. Findings are
compatible with acute hemorrhagic necrotizing pancreatitis. There
are similar less prominent regions of absent parenchymal enhancement
and mixed signal intensity in pancreatico-duodenal groove in the
pancreatic head also compatible with necrotizing pancreatitis.
Overall, approximately 40% of pancreatic parenchyma appears
necrotic. No discrete pancreatic mass. No pancreatic duct dilation.
No pancreas divisum. A few small ill-defined peripancreatic
inflammatory fluid collections are identified, largest 4.2 x 2.9 x
3.0 cm at the inferior margin of the pancreatic tail (series 3/image
30).

Spleen: Top normal size spleen.  No splenic mass.

Adrenals/Urinary Tract: Normal adrenals. No hydronephrosis. Simple
subcentimeter lower left renal cortical cyst. Otherwise normal
kidneys, with no suspicious renal masses.

Stomach/Bowel: Normal non-distended stomach. Visualized small and
large bowel is normal caliber, with no bowel wall thickening.

Vascular/Lymphatic: Normal caliber abdominal aorta. Nonocclusive
thrombosis of the main portal vein (series 23/image 62). Splenic
vein appears occluded. Patent hepatic and renal veins. No
pathologically enlarged lymph nodes in the abdomen.

Other: No abdominal ascites.

Musculoskeletal: No aggressive appearing focal osseous lesions.
IMPRESSION: 1. Acute hemorrhagic necrotizing pancreatitis predominantly
involving the pancreatic tail and pancreatico-duodenal groove
(approximately 40% pancreatic parenchymal necrosis). Small
ill-defined peripancreatic inflammatory fluid collections, largest
4.2 x 2.9 x 3.0 cm at the inferior margin of the pancreatic tail. No
pancreatic duct dilation.
2. Nonocclusive thrombosis of the main portal vein. Occluded splenic
vein.
3. Moderate diffuse hepatic steatosis.
4. Cholelithiasis. No biliary ductal dilatation. No
choledocholithiasis.
5. Small dependent bilateral pleural effusions, unchanged from CT
study from 1 day prior.

## 2020-04-12 MED ORDER — GADOBUTROL 1 MMOL/ML IV SOLN
10.0000 mL | Freq: Once | INTRAVENOUS | Status: AC | PRN
  Administered 2020-04-12: 10 mL via INTRAVENOUS

## 2020-04-12 NOTE — Progress Notes (Addendum)
PROGRESS NOTE  Peter [name] ZOX:[id number] DOB: Aug 26, 1980   PCP: Kallie Locks, FNP  Patient is from: Home  DOA: 04/04/2020 LOS: 8  Chief complaints: Abdominal pain  Brief Narrative / Interim history: 40 year old M with PMH of alcohol abuse, HTN, carditis, depression and anxiety presenting with 3 to 4 days of abdominal pain and nausea and admitted to ICU for alcohol withdrawal and severe sepsis due to possible aspiration pneumonia.  He was treated with Precedex and IV antibiotics.  Withdrawal symptoms improved, and he was transferred to [facility] service on 04/08/2020.  Patient had persistent pain and leukocytosis although his lipase normalized.  Repeat CT abdomen and pelvis showed with worsening pancreatitis with possible necrosis in pancreatic tail but no pseudocyst.  [facility] consulted.  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   Subjective: Seen and examined earlier this morning.  He said he had 7/10 pain earlier this morning.  Pain improved to 3/10 after IV Dilaudid.  He also reports 2 episode of emesis that he describes as spitting.  No other complaints.  Objective: Vitals:   04/11/20 0524 04/11/20 1743 04/11/20 2024 04/12/20 0455  BP: 131/84 138/85 135/74 (!) 166/95  Pulse: 98 92 93 (!) 102  Resp: Temp: 98.3 F (36.8 C) 98.1 F (36.7 C) 98.2 F (36.8 C) 98.5 F (36.9 C)  TempSrc: Oral Oral Oral Oral  SpO2: 96% 97% 95% 94%  Weight:      Height:        Intake/Output Summary (Last 24 hours) at 04/12/2020 1244 Last data filed at 04/12/2020 0526 Gross per 24 hour  Intake 1442.72 ml  Output --  Net 1442.72 ml   Filed Weights   04/04/20 0004  Weight: 103.4 kg    Examination:  GENERAL: No apparent distress.  Nontoxic. HEENT: MMM.  Vision and hearing grossly intact.  NECK: Supple.  No apparent JVD.  RESP: On RA.  No IWOB.  Fair aeration bilaterally. CVS:  RRR. Heart sounds normal.  ABD/GI/GU: BS+.  Abd soft.  Mild tenderness across upper abdomen with deep palpation.  No rebound or guarding. MSK/EXT:  Moves extremities. No apparent deformity. No edema.  SKIN: no apparent skin lesion or wound NEURO: Awake, alert and oriented appropriately.  No apparent focal neuro deficit. PSYCH: Calm. Normal affect.  Procedures:  None  Microbiology summarized: Influenza and COVID-19 PCR nonreactive.  Assessment & Plan: Acute hemorrhagic pancreatitis with necrosis-likely due to alcohol.  -WBC 18.  Normal renal function.  Lipase 55 (improved).  LFT normal. -Continue IV fluid, IV analgesics -Advance to full liquid diet by GI. -Appreciate help by GI.   Acute respiratory failure with hypoxia-resolved. Severe sepsis due to LLL aspiration pneumonia-POA. Completed 5 days of IV CTX. Sleep Related Hypoxemia -OOB/PT/OT/incentive spirometry -Continue as needed duonebs as needed -PCCM to arrange outpatient sleep study but wants to be notified prior to discharge  Alcohol abuse with withdrawal-resolved. Encephalopathy/agitation:  Resolved. -Completed Librium taper -Counseled on alcohol cessation.  Hypokalemia/hypomagnesemia/hypophosphatemia-due to alcohol? -Replenish and recheck.  Abdominal pain/ileus-mild.  Having BMs. -Mobilize. -OOB/PT/OT -As needed MiraLAX  Uncontrolled hypertension/sinus tachycardia-resolved. -Continue home amlodipine, clonidine and Coreg -Resume home losartan at reduced dose -Outpatient sleep study as above  Acute Kidney Injury/hyperkalemia/hyponatremia-Resolved. -IV fluid as above   Anxiety/depression -Continue home Effexor and BuSpar.  Leukocytosis: WBC up to 19.6> 18.1.  Improved..  Due to pancreatitis?  He just completed 5 days of CTX for aspiration pneumonia. -Continue monitoring  Thrombocytopenia: Resolved.  Debility/physical deconditioning-likely due to acute illness.  Improving. -Continue PT/OT  Obesity Body mass index is 32.71 kg/m.         DVT  prophylaxis:  On subcu Lovenox  Code Status: Full code Family Communication: Updated patient's mother at bedside on 2/20. Level of care: Med-Surg Status is: Inpatient  Remains inpatient appropriate because:Hemodynamically unstable, Persistent severe electrolyte disturbances, Unsafe d/c plan, IV treatments appropriate due to intensity of illness or inability to take PO and Inpatient level of care appropriate due to severity of illness   Dispo: The patient is from: Home              Anticipated d/c is to: Home              Anticipated d/c date is: 3 days              Patient currently is not medically stable to d/c.   Difficult to place patient No       Consultants:  PCCM-signed off Gastroenterology   Sch Meds:  Scheduled Meds:  acetaminophen  1,000 mg Oral Q8H   amLODipine  10 mg Oral Daily   busPIRone  7.5 mg Oral TID   carvedilol  6.25 mg Oral BID WC   chlorhexidine  15 mL Mouth Rinse BID   Chlorhexidine Gluconate Cloth  6 each Topical Daily   cloNIDine  0.2 mg Oral BID   folic acid  1 mg Oral Daily   losartan  25 mg Oral Daily   mouth rinse  15 mL Mouth Rinse q12n4p   mouth rinse  15 mL Mouth Rinse BID   multivitamin with minerals  1 tablet Oral Daily   nicotine  14 mg Transdermal Daily   pantoprazole  40 mg Oral BID   sodium chloride flush  3 mL Intravenous Q12H   thiamine  100 mg Oral Daily   Or   thiamine  100 mg Intravenous Daily   venlafaxine XR  75 mg Oral Q breakfast   Continuous Infusions:  0.9 % NaCl with KCl 20 mEq / L 100 mL/hr at 04/12/20 1128   PRN Meds:.albuterol, cyclobenzaprine, HYDROmorphone (DILAUDID) injection, labetalol, ondansetron **OR** ondansetron (ZOFRAN) IV, oxyCODONE, polyethylene glycol  Antimicrobials: Anti-infectives (From admission, onward)    Start     Dose/Rate Route Frequency Ordered Stop   04/06/20 1300  cefTRIAXone (ROCEPHIN) 2 g in sodium chloride 0.9 % 100 mL IVPB  Status:  Discontinued        2 g 200 mL/hr over 30  Minutes Intravenous Every 24 hours 04/06/20 1202 04/10/20 1510        I have personally reviewed the following labs and images: CBC: Recent Labs  Lab 04/08/20 0533 04/09/20 0237 04/10/20 0616 04/11/20 0551 04/12/20 0530  WBC 20.9* 15.8* 17.7* 19.6* 18.1*  NEUTROABS  --  11.8*  --  16.2* 15.0*  HGB 12.9* 11.7* 11.5* 11.5* 11.4*  HCT 38.6* 35.9* 34.2* 34.3* 34.2*  MCV 97.0 98.9 96.6 96.3 96.3  PLT 147* 167 201 282 308   BMP &GFR Recent Labs  Lab 04/08/20 0533 04/09/20 0237 04/10/20 0616 04/11/20 0551 04/12/20 0530  NA 136 137 137 135 136  K 3.8 4.0 3.2* 3.2* 3.7  CL 98 100 98 98 102  CO2 25 26 27 26 24   GLUCOSE 190* 179* 207* 199* 177*  BUN 12 10 9 8 7   CREATININE 0.79 0.68 0.70 0.72  0.74 0.62  CALCIUM 8.5* 8.4* 8.2* 8.3* 8.3*  MG 1.9 2.0 1.8  2.0 1.8  PHOS 3.4 2.3* 2.0* 2.6 2.9   Estimated Creatinine Clearance: 149.4 mL/min (by C-G formula based on SCr of 0.62 mg/dL). Liver & Pancreas: Recent Labs  Lab 04/06/20 0750 04/07/20 1034 04/08/20 0533 04/09/20 0237 04/10/20 0616 04/11/20 0551 04/12/20 0530  AST 28 24 28   --   --  22 20  ALT 32 26 26  --   --  25 21  ALKPHOS 51 73 88  --   --  77 70  BILITOT 1.3* 1.5* 1.4*  --   --  1.0 1.0  PROT 6.0* 7.2 7.4  --   --  7.0 6.6  ALBUMIN 2.6* 2.9* 3.0* 2.7* 2.6* 2.7* 2.6*   Recent Labs  Lab 04/07/20 1034 04/08/20 1851 04/11/20 0938 04/12/20 0530  LIPASE 31 35 63* 55*   No results for input(s): AMMONIA in the last 168 hours. Diabetic: No results for input(s): HGBA1C in the last 72 hours. Recent Labs  Lab 04/11/20 2026 04/12/20 0001 04/12/20 0454 04/12/20 0759 04/12/20 1202  GLUCAP 144* 196* 157* 174* 150*   Cardiac Enzymes: No results for input(s): CKTOTAL, CKMB, CKMBINDEX, TROPONINI in the last 168 hours. No results for input(s): PROBNP in the last 8760 hours. Coagulation Profile: No results for input(s): INR, PROTIME in the last 168 hours. Thyroid Function Tests: Recent Labs     04/11/20 0551  TSH 3.460   Lipid Profile: No results for input(s): CHOL, HDL, LDLCALC, TRIG, CHOLHDL, LDLDIRECT in the last 72 hours. Anemia Panel: No results for input(s): VITAMINB12, FOLATE, FERRITIN, TIBC, IRON, RETICCTPCT in the last 72 hours. Urine analysis:    Component Value Date/Time   COLORURINE AMBER (A) 04/04/2020 2354   APPEARANCEUR CLEAR 04/04/2020 2354   LABSPEC 1.025 04/04/2020 2354   PHURINE 5.0 04/04/2020 2354   GLUCOSEU 50 (A) 04/04/2020 2354   HGBUR SMALL (A) 04/04/2020 2354   BILIRUBINUR NEGATIVE 04/04/2020 2354   BILIRUBINUR Negative 01/28/2019 1521   KETONESUR 5 (A) 04/04/2020 2354   PROTEINUR 100 (A) 04/04/2020 2354   UROBILINOGEN 0.2 01/28/2019 1521   UROBILINOGEN 0.2 11/13/2016 1234   NITRITE NEGATIVE 04/04/2020 2354   LEUKOCYTESUR NEGATIVE 04/04/2020 2354   Sepsis Labs: Invalid input(s): PROCALCITONIN, LACTICIDVEN  Microbiology: Recent Results (from the past 240 hour(s))  Resp Panel by RT-PCR (Flu A&B, Covid) Nasopharyngeal Swab     Status: None   Collection Time: 04/04/20  6:49 AM   Specimen: Nasopharyngeal Swab; Nasopharyngeal(NP) swabs in vial transport medium  Result Value Ref Range Status   SARS Coronavirus 2 by RT PCR NEGATIVE NEGATIVE Final    Comment: (NOTE) SARS-CoV-2 target nucleic acids are NOT DETECTED.  The SARS-CoV-2 RNA is generally detectable in upper respiratory specimens during the acute phase of infection. The lowest concentration of SARS-CoV-2 viral copies this assay can detect is 138 copies/mL. A negative result does not preclude SARS-Cov-2 infection and should not be used as the sole basis for treatment or other patient management decisions. A negative result may occur with  improper specimen collection/handling, submission of specimen other than nasopharyngeal swab, presence of viral mutation(s) within the areas targeted by this assay, and inadequate number of viral copies(<138 copies/mL). A negative result must be  combined with clinical observations, patient history, and epidemiological information. The expected result is Negative.  Fact Sheet for Patients:  04/06/20  Fact Sheet for Healthcare Providers:  BloggerCourse.com  This test is no t yet approved or cleared by the [place] FDA and  has been authorized for detection and/or  diagnosis of SARS-CoV-2 by FDA under an Emergency Use Authorization (EUA). This EUA will remain  in effect (meaning this test can be used) for the duration of the COVID-19 declaration under Section 564(b)(1) of the Act, 21 U.S.C.section 360bbb-3(b)(1), unless the authorization is terminated  or revoked sooner.       Influenza A by PCR NEGATIVE NEGATIVE Final   Influenza B by PCR NEGATIVE NEGATIVE Final    Comment: (NOTE) The Xpert Xpress SARS-CoV-2/FLU/RSV plus assay is intended as an aid in the diagnosis of influenza from Nasopharyngeal swab specimens and should not be used as a sole basis for treatment. Nasal washings and aspirates are unacceptable for Xpert Xpress SARS-CoV-2/FLU/RSV testing.  Fact Sheet for Patients: BloggerCourse.comhttps://www.fda.gov/media/152166/download  Fact Sheet for Healthcare Providers: SeriousBroker.ithttps://www.fda.gov/media/152162/download  This test is not yet approved or cleared by the [place] FDA and has been authorized for detection and/or diagnosis of SARS-CoV-2 by FDA under an Emergency Use Authorization (EUA). This EUA will remain in effect (meaning this test can be used) for the duration of the COVID-19 declaration under Section 564(b)(1) of the Act, 21 U.S.C. section 360bbb-3(b)(1), unless the authorization is terminated or revoked.  Performed at [facility], [address] [place], [place] [address]   MRSA PCR Screening     Status: None   Collection Time: 04/04/20  5:19 PM   Specimen: Nasal Mucosa; Nasopharyngeal  Result Value Ref Range Status   MRSA by  PCR NEGATIVE NEGATIVE Final    Comment:        The GeneXpert MRSA Assay (FDA approved for NASAL specimens only), is one component of a comprehensive MRSA colonization surveillance program. It is not intended to diagnose MRSA infection nor to guide or monitor treatment for MRSA infections. Performed at [facility], [address] [place], [place] [address]     Radiology Studies: MR 3D Recon At Scanner  Result Date: 04/12/2020 CLINICAL DATA:  Inpatient.  Acute pancreatitis.  Alcohol abuse. EXAM: MRI ABDOMEN WITHOUT AND WITH CONTRAST (INCLUDING MRCP) TECHNIQUE: Multiplanar multisequence MR imaging of the abdomen was performed both before and after the administration of intravenous contrast. Heavily T2-weighted images of the biliary and pancreatic ducts were obtained, and three-dimensional MRCP images were rendered by post processing. CONTRAST:  10mL GADAVIST GADOBUTROL 1 MMOL/ML IV SOLN COMPARISON:  04/11/2020 CT abdomen/pelvis. FINDINGS: Lower chest: Small dependent bilateral pleural effusions, unchanged from CT study from 1 day prior. Hepatobiliary: Top-normal liver size. No liver surface irregularity. Moderate diffuse hepatic steatosis. Subcentimeter simple segment 6 right liver cyst. No suspicious liver masses. A few tiny layering 2-3 mm gallstones in the otherwise normal gallbladder with no gallbladder wall thickening or pericholecystic fluid. No biliary ductal dilatation. Common bile duct diameter 4 mm. No choledocholithiasis. No biliary masses, strictures or beading. Pancreas: There is diffuse expansion of pancreas, most prominent in the pancreatic tail. There is abnormal mixed signal intensity throughout the expanded pancreatic tail with areas of patchy pre-contrast T1 hyperintensity. There is absence of parenchymal enhancement throughout much of the pancreatic tail. Findings are compatible with acute hemorrhagic necrotizing pancreatitis. There are similar less prominent  regions of absent parenchymal enhancement and mixed signal intensity in pancreatico-duodenal groove in the pancreatic head also compatible with necrotizing pancreatitis. Overall, approximately 40% of pancreatic parenchyma appears necrotic. No discrete pancreatic mass. No pancreatic duct dilation. No pancreas divisum. A few small ill-defined peripancreatic inflammatory fluid collections are identified, largest 4.2 x 2.9 x 3.0 cm at the inferior margin of the pancreatic tail (  series 3/image 30). Spleen: Top normal size spleen.  No splenic mass. Adrenals/Urinary Tract: Normal adrenals. No hydronephrosis. Simple subcentimeter lower left renal cortical cyst. Otherwise normal kidneys, with no suspicious renal masses. Stomach/Bowel: Normal non-distended stomach. Visualized small and large bowel is normal caliber, with no bowel wall thickening. Vascular/Lymphatic: Normal caliber abdominal aorta. 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 Splenic vein appears occluded. Patent hepatic and renal veins. No pathologically enlarged lymph nodes in the abdomen. Other: No abdominal ascites. Musculoskeletal: No aggressive appearing focal osseous lesions. IMPRESSION: 1. Acute hemorrhagic necrotizing pancreatitis predominantly involving the pancreatic tail and pancreatico-duodenal groove (approximately 40% pancreatic parenchymal necrosis). Small ill-defined peripancreatic inflammatory fluid collections, largest 4.2 x 2.9 x 3.0 cm at the inferior margin of the pancreatic tail. No pancreatic duct dilation. 2. 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 3. Moderate diffuse hepatic steatosis. 4. Cholelithiasis. No biliary ductal dilatation. No choledocholithiasis. 5. Small dependent bilateral pleural effusions, unchanged from CT study from 1 day prior. Electronically Signed   By: Delbert Phenix M.D.   On: 04/12/2020 08:43   MR ABDOMEN MRCP W WO CONTAST  Result Date:  04/12/2020 CLINICAL DATA:  Inpatient.  Acute pancreatitis.  Alcohol abuse. EXAM: MRI ABDOMEN WITHOUT AND WITH CONTRAST (INCLUDING MRCP) TECHNIQUE: Multiplanar multisequence MR imaging of the abdomen was performed both before and after the administration of intravenous contrast. Heavily T2-weighted images of the biliary and pancreatic ducts were obtained, and three-dimensional MRCP images were rendered by post processing. CONTRAST:  10mL GADAVIST GADOBUTROL 1 MMOL/ML IV SOLN COMPARISON:  04/11/2020 CT abdomen/pelvis. FINDINGS: Lower chest: Small dependent bilateral pleural effusions, unchanged from CT study from 1 day prior. Hepatobiliary: Top-normal liver size. No liver surface irregularity. Moderate diffuse hepatic steatosis. Subcentimeter simple segment 6 right liver cyst. No suspicious liver masses. A few tiny layering 2-3 mm gallstones in the otherwise normal gallbladder with no gallbladder wall thickening or pericholecystic fluid. No biliary ductal dilatation. Common bile duct diameter 4 mm. No choledocholithiasis. No biliary masses, strictures or beading. Pancreas: There is diffuse expansion of pancreas, most prominent in the pancreatic tail. There is abnormal mixed signal intensity throughout the expanded pancreatic tail with areas of patchy pre-contrast T1 hyperintensity. There is absence of parenchymal enhancement throughout much of the pancreatic tail. Findings are compatible with acute hemorrhagic necrotizing pancreatitis. There are similar less prominent regions of absent parenchymal enhancement and mixed signal intensity in pancreatico-duodenal groove in the pancreatic head also compatible with necrotizing pancreatitis. Overall, approximately 40% of pancreatic parenchyma appears necrotic. No discrete pancreatic mass. No pancreatic duct dilation. No pancreas divisum. A few small ill-defined peripancreatic inflammatory fluid collections are identified, largest 4.2 x 2.9 x 3.0 cm at the inferior margin of  the pancreatic tail (series 3/image 30). Spleen: Top normal size spleen.  No splenic mass. Adrenals/Urinary Tract: Normal adrenals. No hydronephrosis. Simple subcentimeter lower left renal cortical cyst. Otherwise normal kidneys, with no suspicious renal masses. Stomach/Bowel: Normal non-distended stomach. Visualized small and large bowel is normal caliber, with no bowel wall thickening. Vascular/Lymphatic: Normal caliber abdominal aorta. 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 Splenic vein appears occluded. Patent hepatic and renal veins. No pathologically enlarged lymph nodes in the abdomen. Other: No abdominal ascites. Musculoskeletal: No aggressive appearing focal osseous lesions. IMPRESSION: 1. Acute hemorrhagic necrotizing pancreatitis predominantly involving the pancreatic tail and pancreatico-duodenal groove (approximately 40% pancreatic parenchymal necrosis). Small ill-defined peripancreatic inflammatory fluid collections, largest 4.2 x 2.9 x 3.0 cm at the  inferior margin of the pancreatic tail. No pancreatic duct dilation. 2. 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 3. Moderate diffuse hepatic steatosis. 4. Cholelithiasis. No biliary ductal dilatation. No choledocholithiasis. 5. Small dependent bilateral pleural effusions, unchanged from CT study from 1 day prior. Electronically Signed   By: Delbert Phenix M.D.   On: 04/12/2020 08:43       Alferd Obryant T. Jamariya Davidoff [facility]  If 7PM-7AM, please contact night-coverage [email or web address] 04/12/2020, 12:44 PM

## 2020-04-12 NOTE — Progress Notes (Signed)
Physical Therapy Treatment Patient Details Name: Peter [name] MRN: [id number] DOB: [birth year] Today's Date: 04/12/2020    History of Present Illness 40 year old M with PMH of alcohol abuse, HTN, carditis, depression and anxiety presenting with 3 to 4 days of abdominal pain and nausea and admitted to ICU for alcohol withdrawal and severe sepsis due to possible aspiration pneumonia.  He was treated with Precedex and IV antibiotics.  Withdrawal symptoms have improved,    PT Comments    Assisted OOB to amb a great distance in hallway.  General Gait Details: no AD needed this session.  tolerated a functional distance.  No LOB.  Good alternating gait.  "I feel better" stated pt.  No dyspnea.  Pt has met his Acute PT goals.  Will consult evaluating LPT  Follow Up Recommendations  No PT follow up     Equipment Recommendations  None recommended by PT    Recommendations for Other Services       Precautions / Restrictions Precautions Precautions: None    Mobility  Bed Mobility Overal bed mobility: Modified Independent             General bed mobility comments: self able    Transfers Overall transfer level: Modified independent               General transfer comment: self able with good use of hands to steady self and good safety cognition  Ambulation/Gait Ambulation/Gait assistance: Modified independent (Device/Increase time) Gait Distance (Feet): 425 Feet Assistive device: None Gait Pattern/deviations: Step-through pattern Gait velocity: WFL   General Gait Details: no AD needed this session.  tolerated a functional distance.  No LOB.  Good alternating gait.  "I feel better" stated pt.  No dyspnea.   Stairs             Wheelchair Mobility    Modified Rankin (Stroke Patients Only)       Balance                                            Cognition Arousal/Alertness: Awake/alert Behavior During Therapy: WFL for tasks  assessed/performed Overall Cognitive Status: Within Functional Limits for tasks assessed                                 General Comments: AxO x 3 motivated      Exercises      General Comments        Pertinent Vitals/Pain Pain Assessment: Faces Faces Pain Scale: Hurts a little bit Pain Location: epi ABD Pain Descriptors / Indicators: Aching;Burning Pain Intervention(s): Monitored during session    Home Living                      Prior Function            PT Goals (current goals can now be found in the care plan section) Progress towards PT goals: Progressing toward goals (Goals met.Marland Kitchen..Marland Kitchenwill consult evaluating LPT)    Frequency    Min 3X/week      PT Plan Current plan remains appropriate    Co-evaluation              AM-PAC PT "6 Clicks" Mobility   Outcome Measure  Help needed turning from your back to your side while in a flat  bed without using bedrails?: None Help needed moving from lying on your back to sitting on the side of a flat bed without using bedrails?: None Help needed moving to and from a bed to a chair (including a wheelchair)?: None Help needed standing up from a chair using your arms (e.g., wheelchair or bedside chair)?: None Help needed to walk in hospital room?: None Help needed climbing 3-5 steps with a railing? : None 6 Click Score: 24    End of Session Equipment Utilized During Treatment: Gait belt Activity Tolerance: Patient tolerated treatment well Patient left: in bed;with family/visitor present;with call [name]/phone within reach   PT Visit Diagnosis: Difficulty in walking, not elsewhere classified (R26.2)     Time: 1432-1450 PT Time Calculation (min) (ACUTE ONLY): 18 min  Charges:  $Gait Training: 8-22 mins                     Rica Koyanagi  PTA Acute  Rehabilitation Services Pager      [phone] Office      [phone]

## 2020-04-12 NOTE — Consult Note (Signed)
Referring Provider: [facility] Primary Care Physician:  Kallie Locks, FNP Primary Gastroenterologist:  Gentry Fitz  Reason for Consultation: Hemorrhagic necrotizing pancreatitis  HPI: Peter [name] is a 40 y.o. male with history of alcohol use and multiple episodes of pancreatitis presenting for consultation of acute hemorrhagic necrotizing pancreatitis.  Patient's current episode of pancreatitis started around 2/9 and led him to present to the ED on 2/13.  He reports 4-5 prior episodes of pancreatitis.  Prior to admission, he was drinking one fifth of liquor a day.  He states he has been drinking this amount for the past 2 years.  He reports continued epigastric abdominal pain, which is well controlled with current pain regimen.  He has nausea with meals, particularly when he was advanced to a soft diet.  He reports "greasy" stools.  Denies any new medication changes.  Denies any family history of pancreatitis or other gastrointestinal disorders.  MRI/MRCP today: Acute hemorrhagic necrotizing pancreatitis predominantly involving the pancreatic tail and pancreatico-duodenal groove (approximately 40% pancreatic parenchymal necrosis). Small ill-defined peripancreatic inflammatory fluid collections, largest 4.2 x 2.9 x 3.0 cm at the inferior margin of the pancreatic tail. No pancreatic duct dilation. 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 Cholelithiasis. No biliary ductal dilatation. No choledocholithiasis  Past Medical History:  Diagnosis Date  . Alcoholism (HCC)   . DKA (diabetic ketoacidosis) (HCC) 04/04/2020  . Hypertension   . Substance abuse [facility])     Past Surgical History:  Procedure Laterality Date  . DENTAL SURGERY    . TIBIA IM NAIL INSERTION Right 01/22/2018   Procedure: RIGHT INTRAMEDULLARY (IM) NAIL TIBIAL;  Surgeon: Sheral Apley, MD;  Location: [facility] OR;  Service: Orthopedics;  Laterality: Right;    Prior to Admission medications    Medication Sig Start Date End Date Taking? Authorizing Provider  amLODipine (NORVASC) 10 MG tablet Take 10 mg by mouth daily. 02/28/20  Yes [provider]  aspirin EC 81 MG tablet Take 1 tablet (81 mg total) by mouth 2 (two) times daily. For DVT prophylaxis for 30 days after surgery. 01/22/18  Yes Martensen, Lucretia Kern III, PA-C  busPIRone (BUSPAR) 7.5 MG tablet Take 1 tablet (7.5 mg total) by mouth 3 (three) times daily. 11/04/19  Yes Kallie Locks, FNP  cloNIDine (CATAPRES) 0.2 MG tablet Take 0.2 mg by mouth 3 (three) times daily. 02/28/20  Yes [provider]  DM-Phenylephrine-Acetaminophen (VICKS DAYQUIL COLD & FLU) 10-5-325 MG/15ML LIQD Take 15 mLs by mouth as needed (cold/congestion).   Yes [provider]  folic acid (FOLVITE) 1 MG tablet Take 1 tablet (1 mg total) by mouth daily. 05/21/17  Yes Rolly Salter, MD  hydrALAZINE (APRESOLINE) 50 MG tablet Take 50 mg by mouth every 8 (eight) hours. 02/28/20  Yes [provider]  magnesium oxide (MAG-OX) 400 MG tablet Take 1 tablet by mouth 2 (two) times daily. 02/28/20  Yes [provider]  metoprolol tartrate (LOPRESSOR) 25 MG tablet Take 1 tablet (25 mg total) by mouth 2 (two) times daily. Patient to keep follow up appointment to receive additional refills. 11/04/19  Yes Kallie Locks, FNP  OVER THE COUNTER MEDICATION Take 6 tablets by mouth daily. Juice plus gummies   Yes [provider]  pantoprazole (PROTONIX) 40 MG tablet Take 40 mg by mouth daily. 02/28/20  Yes [provider]  thiamine 100 MG tablet Take 100 mg by mouth daily. 02/28/20  Yes [provider]  venlafaxine XR (EFFEXOR-XR) 75 MG 24 hr  capsule Take 1 capsule (75 mg total) by mouth daily with breakfast. 11/04/19 12/04/19 Yes Kallie Locks, FNP  amLODipine (NORVASC) 5 MG tablet Take 1 tablet (5 mg total) by mouth daily. Patient not taking: Reported on 04/04/2020 01/28/19   Kallie Locks, FNP  cloNIDine  (CATAPRES) 0.1 MG tablet Take 1 tablet (0.1 mg total) by mouth 2 (two) times daily. Patient not taking: Reported on 04/04/2020 11/04/19   Kallie Locks, FNP  losartan (COZAAR) 100 MG tablet Take 1 tablet (100 mg total) by mouth daily. Patient not taking: No sig reported 11/04/19   Kallie Locks, FNP    Scheduled Meds: . acetaminophen  1,000 mg Oral Q8H  . amLODipine  10 mg Oral Daily  . busPIRone  7.5 mg Oral TID  . carvedilol  6.25 mg Oral BID WC  . chlorhexidine  15 mL Mouth Rinse BID  . Chlorhexidine Gluconate Cloth  6 each Topical Daily  . cloNIDine  0.2 mg Oral BID  . folic acid  1 mg Oral Daily  . losartan  25 mg Oral Daily  . mouth rinse  15 mL Mouth Rinse q12n4p  . mouth rinse  15 mL Mouth Rinse BID  . multivitamin with minerals  1 tablet Oral Daily  . nicotine  14 mg Transdermal Daily  . pantoprazole  40 mg Oral BID  . sodium chloride flush  3 mL Intravenous Q12H  . thiamine  100 mg Oral Daily   Or  . thiamine  100 mg Intravenous Daily  . venlafaxine XR  75 mg Oral Q breakfast   Continuous Infusions: . 0.9 % NaCl with KCl 20 mEq / L 100 mL/hr at 04/12/20 0125   PRN Meds:.albuterol, cyclobenzaprine, HYDROmorphone (DILAUDID) injection, labetalol, ondansetron **OR** ondansetron (ZOFRAN) IV, oxyCODONE, polyethylene glycol  Allergies as of 04/03/2020  . (No Known Allergies)    Family History  Problem Relation Age of Onset  . Hypertension Father   . Stroke Father   . CAD Father   . Cancer Paternal Grandfather        Lung  . Hyperlipidemia Paternal Grandfather   . Hypertension Paternal Grandfather     Social History   Socioeconomic History  . Marital status: Single    Spouse name: Not on file  . Number of children: Not on file  . Years of education: Not on file  . Highest education level: Not on file  Occupational History  . Not on file  Tobacco Use  . Smoking status: Current Every Day Smoker    Packs/day: 0.50    Types: Cigarettes  . Smokeless  tobacco: Never Used  Vaping Use  . Vaping Use: Never used  Substance and Sexual Activity  . Alcohol use: Yes    Comment:    . Drug use: No  . Sexual activity: Yes    Birth control/protection: Condom  Other Topics Concern  . Not on file  Social History Narrative  . Not on file   Social Determinants of Health   Financial Resource Strain: Not on file  Food Insecurity: Not on file  Transportation Needs: Not on file  Physical Activity: Not on file  Stress: Not on file  Social Connections: Not on file  Intimate Partner Violence: Not on file    Review of Systems: Review of Systems  Constitutional: Positive for malaise/fatigue. Negative for chills and fever.  HENT: Negative for hearing loss and tinnitus.   Eyes: Negative for pain and redness.  Respiratory: Negative for  cough and shortness of breath.   Cardiovascular: Negative for chest pain and palpitations.  Gastrointestinal: Positive for abdominal pain, diarrhea, nausea and vomiting. Negative for blood in stool, constipation, heartburn and melena.  Genitourinary: Negative for flank pain and hematuria.  Musculoskeletal: Negative for falls and joint pain.  Skin: Negative for itching and rash.  Neurological: Negative for seizures and loss of consciousness.  Endo/Heme/Allergies: Negative for polydipsia. Does not bruise/bleed easily.  Psychiatric/Behavioral: Positive for substance abuse (ETOH). The patient is not nervous/anxious.      Physical Exam: Vital signs: Vitals:   04/11/20 2024 04/12/20 0455  BP: 135/74 (!) 166/95  Pulse: 93 (!) 102  Resp: 16 16  Temp: 98.2 F (36.8 C) 98.5 F (36.9 C)  SpO2: 95% 94%   Last BM Date: 04/11/20  Physical Exam Vitals reviewed.  Constitutional:      General: He is not in acute distress.    Appearance: He is overweight.  HENT:     Head: Normocephalic and atraumatic.     Nose: Nose normal. No congestion.     Mouth/Throat:     Mouth: Mucous membranes are moist.     Pharynx:  Oropharynx is clear.  Eyes:     General: No scleral icterus.    Extraocular Movements: Extraocular movements intact.     Conjunctiva/sclera: Conjunctivae normal.  Cardiovascular:     Rate and Rhythm: Normal rate and regular rhythm.     Pulses: Normal pulses.  Pulmonary:     Effort: Pulmonary effort is normal. No respiratory distress.     Breath sounds: Normal breath sounds.  Abdominal:     General: Bowel sounds are normal. There is distension.     Palpations: Abdomen is soft. There is no mass.     Tenderness: There is abdominal tenderness (mild, epigastric). There is no guarding or rebound.     Hernia: No hernia is present.  Musculoskeletal:        General: No swelling or tenderness.     Cervical back: Normal range of motion and neck supple.  Skin:    General: Skin is warm and dry.     Coloration: Skin is not jaundiced.  Neurological:     General: No focal deficit present.     Mental Status: He is alert and oriented to person, place, and time.  Psychiatric:        Mood and Affect: Mood normal.        Behavior: Behavior normal. Behavior is cooperative.     GI:  Lab Results: Recent Labs    04/10/20 0616 04/11/20 0551 04/12/20 0530  WBC 17.7* 19.6* 18.1*  HGB 11.5* 11.5* 11.4*  HCT 34.2* 34.3* 34.2*  PLT 201 282 308   BMET Recent Labs    04/10/20 0616 04/11/20 0551 04/12/20 0530  NA 137 135 136  K 3.2* 3.2* 3.7  CL 98 98 102  CO2 27 26 24   GLUCOSE 207* 199* 177*  BUN 9 8 7   CREATININE 0.70 0.72  0.74 0.62  CALCIUM 8.2* 8.3* 8.3*   LFT Recent Labs    04/12/20 0530  PROT 6.6  ALBUMIN 2.6*  AST 20  ALT 21  ALKPHOS 70  BILITOT 1.0   PT/INR No results for input(s): LABPROT, INR in the last 72 hours.   Studies/Results: CT ABDOMEN PELVIS W CONTRAST  Result Date: 04/11/2020 CLINICAL DATA:  Acute abdominal pain and now with some back pain, follow-up pancreatitis EXAM: CT ABDOMEN AND PELVIS WITH CONTRAST TECHNIQUE: Multidetector CT imaging  of the  abdomen and pelvis was performed using the standard protocol following bolus administration of intravenous contrast. Sagittal and coronal MPR images reconstructed from axial data set. CONTRAST:  OMNIPAQUE IOHEXOL 300 MG/ML SOLN IV. No oral contrast. COMPARISON:  04/04/2020 FINDINGS: Lower chest: Small bibasilar pleural effusions with compressive atelectasis of LEFT lower lobe. Hepatobiliary: Fatty infiltration of liver. Dependent tiny calculi within gallbladder. No focal hepatic mass or definite biliary dilatation. Focal sparing adjacent to gallbladder fossa. Pancreas: Enlarged edematous pancreas with extensive surrounding inflammatory changes of consistent with acute pancreatitis. Pancreatic tail demonstrates low-attenuation at L lack of enhancement versus prior study, could represent severe edema or pancreatic necrosis. No ductal dilatation or pancreatic hemorrhage. No new pancreatic fluid collections. Increased pancreatic edema along anterior pararenal fascia into LEFT lateral conal fascia and adjacent to duodenum. Spleen: Normal appearance Adrenals/Urinary Tract: Adrenal glands, kidneys, ureters, and bladder normal appearance Stomach/Bowel: Mild gastric wall thickening. Thickening of splenic flexure of colon and descending colon. Remaining bowel loops unremarkable. Appendix not identified. Vascular/Lymphatic: Small filling defect is seen centrally within the portal vein consistent with nonocclusive portal thrombosis. Remaining vascular structures patent. Scattered normal size mesenteric lymph nodes. Reproductive: Unremarkable prostate gland and seminal vesicles Other: No free air. Scattered minimal free fluid. Tiny umbilical hernia containing fat. Musculoskeletal: No acute osseous findings IMPRESSION: Increased size of pancreas since prior study with increased peripancreatic inflammatory changes consistent with worsening of pancreatitis. Greater enlargement and low-attenuation of the pancreatic tail versus  previous exam, either representing increased pancreatic edema or developing pancreatic necrosis. Nonocclusive thrombus within portal vein. No discrete drainable fluid collection identified. Associated wall thickening of stomach and descending colon/splenic flexure. Cholelithiasis. Fatty infiltration of liver with focal sparing adjacent to gallbladder fossa. Small bibasilar pleural effusions. Findings called to Dr. Alanda Slim On 04/11/2020 at 1257 hours. Electronically Signed   By: Ulyses Southward M.D.   On: 04/11/2020 13:21   MR 3D Recon At Scanner  Result Date: 04/12/2020 CLINICAL DATA:  Inpatient.  Acute pancreatitis.  Alcohol abuse. EXAM: MRI ABDOMEN WITHOUT AND WITH CONTRAST (INCLUDING MRCP) TECHNIQUE: Multiplanar multisequence MR imaging of the abdomen was performed both before and after the administration of intravenous contrast. Heavily T2-weighted images of the biliary and pancreatic ducts were obtained, and three-dimensional MRCP images were rendered by post processing. CONTRAST:  10mL GADAVIST GADOBUTROL 1 MMOL/ML IV SOLN COMPARISON:  04/11/2020 CT abdomen/pelvis. FINDINGS: Lower chest: Small dependent bilateral pleural effusions, unchanged from CT study from 1 day prior. Hepatobiliary: Top-normal liver size. No liver surface irregularity. Moderate diffuse hepatic steatosis. Subcentimeter simple segment 6 right liver cyst. No suspicious liver masses. A few tiny layering 2-3 mm gallstones in the otherwise normal gallbladder with no gallbladder wall thickening or pericholecystic fluid. No biliary ductal dilatation. Common bile duct diameter 4 mm. No choledocholithiasis. No biliary masses, strictures or beading. Pancreas: There is diffuse expansion of pancreas, most prominent in the pancreatic tail. There is abnormal mixed signal intensity throughout the expanded pancreatic tail with areas of patchy pre-contrast T1 hyperintensity. There is absence of parenchymal enhancement throughout much of the pancreatic tail.  Findings are compatible with acute hemorrhagic necrotizing pancreatitis. There are similar less prominent regions of absent parenchymal enhancement and mixed signal intensity in pancreatico-duodenal groove in the pancreatic head also compatible with necrotizing pancreatitis. Overall, approximately 40% of pancreatic parenchyma appears necrotic. No discrete pancreatic mass. No pancreatic duct dilation. No pancreas divisum. A few small ill-defined peripancreatic inflammatory fluid collections are identified, largest 4.2 x 2.9 x 3.0 cm at  the inferior margin of the pancreatic tail (series 3/image 30). Spleen: Top normal size spleen.  No splenic mass. Adrenals/Urinary Tract: Normal adrenals. No hydronephrosis. Simple subcentimeter lower left renal cortical cyst. Otherwise normal kidneys, with no suspicious renal masses. Stomach/Bowel: Normal non-distended stomach. Visualized small and large bowel is normal caliber, with no bowel wall thickening. Vascular/Lymphatic: Normal caliber abdominal aorta. 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 Splenic vein appears occluded. Patent hepatic and renal veins. No pathologically enlarged lymph nodes in the abdomen. Other: No abdominal ascites. Musculoskeletal: No aggressive appearing focal osseous lesions. IMPRESSION: 1. Acute hemorrhagic necrotizing pancreatitis predominantly involving the pancreatic tail and pancreatico-duodenal groove (approximately 40% pancreatic parenchymal necrosis). Small ill-defined peripancreatic inflammatory fluid collections, largest 4.2 x 2.9 x 3.0 cm at the inferior margin of the pancreatic tail. No pancreatic duct dilation. 2. 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 3. Moderate diffuse hepatic steatosis. 4. Cholelithiasis. No biliary ductal dilatation. No choledocholithiasis. 5. Small dependent bilateral pleural effusions, unchanged from CT study from 1 day prior. Electronically Signed   By:  Delbert Phenix M.D.   On: 04/12/2020 08:43   MR ABDOMEN MRCP W WO CONTAST  Result Date: 04/12/2020 CLINICAL DATA:  Inpatient.  Acute pancreatitis.  Alcohol abuse. EXAM: MRI ABDOMEN WITHOUT AND WITH CONTRAST (INCLUDING MRCP) TECHNIQUE: Multiplanar multisequence MR imaging of the abdomen was performed both before and after the administration of intravenous contrast. Heavily T2-weighted images of the biliary and pancreatic ducts were obtained, and three-dimensional MRCP images were rendered by post processing. CONTRAST:  15mL GADAVIST GADOBUTROL 1 MMOL/ML IV SOLN COMPARISON:  04/11/2020 CT abdomen/pelvis. FINDINGS: Lower chest: Small dependent bilateral pleural effusions, unchanged from CT study from 1 day prior. Hepatobiliary: Top-normal liver size. No liver surface irregularity. Moderate diffuse hepatic steatosis. Subcentimeter simple segment 6 right liver cyst. No suspicious liver masses. A few tiny layering 2-3 mm gallstones in the otherwise normal gallbladder with no gallbladder wall thickening or pericholecystic fluid. No biliary ductal dilatation. Common bile duct diameter 4 mm. No choledocholithiasis. No biliary masses, strictures or beading. Pancreas: There is diffuse expansion of pancreas, most prominent in the pancreatic tail. There is abnormal mixed signal intensity throughout the expanded pancreatic tail with areas of patchy pre-contrast T1 hyperintensity. There is absence of parenchymal enhancement throughout much of the pancreatic tail. Findings are compatible with acute hemorrhagic necrotizing pancreatitis. There are similar less prominent regions of absent parenchymal enhancement and mixed signal intensity in pancreatico-duodenal groove in the pancreatic head also compatible with necrotizing pancreatitis. Overall, approximately 40% of pancreatic parenchyma appears necrotic. No discrete pancreatic mass. No pancreatic duct dilation. No pancreas divisum. A few small ill-defined peripancreatic  inflammatory fluid collections are identified, largest 4.2 x 2.9 x 3.0 cm at the inferior margin of the pancreatic tail (series 3/image 30). Spleen: Top normal size spleen.  No splenic mass. Adrenals/Urinary Tract: Normal adrenals. No hydronephrosis. Simple subcentimeter lower left renal cortical cyst. Otherwise normal kidneys, with no suspicious renal masses. Stomach/Bowel: Normal non-distended stomach. Visualized small and large bowel is normal caliber, with no bowel wall thickening. Vascular/Lymphatic: Normal caliber abdominal aorta. 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 Splenic vein appears occluded. Patent hepatic and renal veins. No pathologically enlarged lymph nodes in the abdomen. Other: No abdominal ascites. Musculoskeletal: No aggressive appearing focal osseous lesions. IMPRESSION: 1. Acute hemorrhagic necrotizing pancreatitis predominantly involving the pancreatic tail and pancreatico-duodenal groove (approximately 40% pancreatic parenchymal necrosis). Small ill-defined peripancreatic inflammatory fluid collections, largest 4.2  x 2.9 x 3.0 cm at the inferior margin of the pancreatic tail. No pancreatic duct dilation. 2. 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 3. Moderate diffuse hepatic steatosis. 4. Cholelithiasis. No biliary ductal dilatation. No choledocholithiasis. 5. Small dependent bilateral pleural effusions, unchanged from CT study from 1 day prior. Electronically Signed   By: Delbert Phenix M.D.   On: 04/12/2020 08:43    Impression: Acute hemorrhagic necrotizing pancreatitis due to alcohol. -MRI/MRCP today revealed acute hemorrhagic necrotizing pancreatitis predominantly involving the pancreatic tail.  Small ill-defined peripancreatic inflammatory fluid collections, largest 4.2 x 2.9 x 3.0 cm. No pancreatic duct dilation. Cholelithiasis; No biliary ductal dilatation or choledocholithiasis -WBCs 18.1 -Normal kidney function: BUN  7/creatinine 0.62 -Lipase 55 today (1076 on admission) -Normal LFTs -Albumin 2.6  Plan: Continue IV fluids at current rate (100 cc/hour).  Continue to monitor hemoglobin with CBC daily.  Recommend inpatient observation for the next 2 to 3 days due to risk of hemorrhaging in the setting of hemorrhagic pancreatitis.  Full liquid diet today, advance as tolerated.  Alcohol cessation.  [facility] will follow.   LOS: 8 days   Edrick Kins  PA-C 04/12/2020, 8:53 AM  Contact #  [phone]

## 2020-04-12 NOTE — Progress Notes (Signed)
Physical Therapy Discharge Patient Details Name: Peter [name] MRN: [id number] DOB: 11-03-1980 Today's Date: 04/12/2020 Time: 9357-0177 PT Time Calculation (min) (ACUTE ONLY): 18 min  Patient discharged from PT services secondary to goals met and no further PT needs identified.  Please see latest therapy progress note for current level of functioning and progress toward goals.    Progress and discharge plan discussed with patient and/or caregiver: Patient/Caregiver agrees with plan  GP     Peter [name] 04/12/2020, 4:55 PM

## 2020-04-13 LAB — GLUCOSE, CAPILLARY
Glucose-Capillary: 138 mg/dL — ABNORMAL HIGH (ref 70–99)
Glucose-Capillary: 139 mg/dL — ABNORMAL HIGH (ref 70–99)
Glucose-Capillary: 153 mg/dL — ABNORMAL HIGH (ref 70–99)
Glucose-Capillary: 153 mg/dL — ABNORMAL HIGH (ref 70–99)
Glucose-Capillary: 156 mg/dL — ABNORMAL HIGH (ref 70–99)
Glucose-Capillary: 160 mg/dL — ABNORMAL HIGH (ref 70–99)
Glucose-Capillary: 165 mg/dL — ABNORMAL HIGH (ref 70–99)

## 2020-04-13 LAB — CBC
HCT: 36.4 % — ABNORMAL LOW (ref 39.0–52.0)
Hemoglobin: 11.7 g/dL — ABNORMAL LOW (ref 13.0–17.0)
MCH: 31.5 pg (ref 26.0–34.0)
MCHC: 32.1 g/dL (ref 30.0–36.0)
MCV: 98.1 fL (ref 80.0–100.0)
Platelets: 366 10*3/uL (ref 150–400)
RBC: 3.71 MIL/uL — ABNORMAL LOW (ref 4.22–5.81)
RDW: 14.8 % (ref 11.5–15.5)
WBC: 16.2 10*3/uL — ABNORMAL HIGH (ref 4.0–10.5)
nRBC: 0 % (ref 0.0–0.2)

## 2020-04-13 LAB — COMPREHENSIVE METABOLIC PANEL
ALT: 19 U/L (ref 0–44)
AST: 20 U/L (ref 15–41)
Albumin: 2.7 g/dL — ABNORMAL LOW (ref 3.5–5.0)
Alkaline Phosphatase: 68 U/L (ref 38–126)
Anion gap: 12 (ref 5–15)
BUN: 5 mg/dL — ABNORMAL LOW (ref 6–20)
CO2: 23 mmol/L (ref 22–32)
Calcium: 8.4 mg/dL — ABNORMAL LOW (ref 8.9–10.3)
Chloride: 100 mmol/L (ref 98–111)
Creatinine, Ser: 0.66 mg/dL (ref 0.61–1.24)
GFR, Estimated: >60 mL/min (ref >60)
Glucose, Bld: 160 mg/dL — ABNORMAL HIGH (ref 70–99)
Potassium: 3.9 mmol/L (ref 3.5–5.1)
Sodium: 135 mmol/L (ref 135–145)
Total Bilirubin: 0.8 mg/dL (ref 0.3–1.2)
Total Protein: 6.9 g/dL (ref 6.5–8.1)

## 2020-04-13 LAB — LIPASE, BLOOD: Lipase: 62 U/L — ABNORMAL HIGH (ref 11–51)

## 2020-04-13 LAB — PHOSPHORUS: Phosphorus: 3.1 mg/dL (ref 2.5–4.6)

## 2020-04-13 LAB — MAGNESIUM: Magnesium: 1.8 mg/dL (ref 1.7–2.4)

## 2020-04-13 MED ORDER — PANCRELIPASE (LIP-PROT-AMYL) 12000-38000 UNITS PO CPEP
36000.0000 [IU] | ORAL_CAPSULE | Freq: Three times a day (TID) | ORAL | Status: DC
  Administered 2020-04-13 – 2020-04-15 (×6): 36000 [IU] via ORAL
  Filled 2020-04-13 (×6): qty 3

## 2020-04-13 NOTE — Progress Notes (Signed)
[facility] Gastroenterology Progress Note  Peter [name] 40 y.o. 12/22/1980  CC:  Acute hemorrhagic necrotizing pancreatitis  Subjective: Patient reports feeling about the same today as he did yesterday.  Continues to have epigastric pain and reports back pain today, which he believes is from lying in bed.  He continues to have loose stools. Denies nausea/vomiting and is tolerating a full liquid diet.  ROS : Review of Systems  Gastrointestinal: Positive for abdominal pain and diarrhea. Negative for blood in stool, constipation, heartburn, melena, nausea and vomiting.  Musculoskeletal: Positive for back pain. Negative for joint pain.    Objective: Vital signs in last 24 hours: Vitals:   04/12/20 2008 04/13/20 0417  BP: (!) 156/88 (!) 150/95  Pulse: 97 (!) 102  Resp: 18 18  Temp: 98.8 F (37.1 C) 98.2 F (36.8 C)  SpO2: 97% 97%    Physical Exam:  General:  Alert, oriented, ooperative, no distress  Head:  Normocephalic, without obvious abnormality, atraumatic  Eyes:  Anicteric sclera, EOMs intact  Lungs:   Clear to auscultation bilaterally, respirations unlabored  Heart:  Regular rate and rhythm, S1, S2 normal  Abdomen:   Soft with mild to moderate epigastric tenderness on palpation.  No guarding or peritoneal signs. Normoactive bowel sounds  Extremities: Extremities normal, atraumatic, no  edema  Pulses: 2+ and symmetric    Lab Results: Recent Labs    04/12/20 0530 04/13/20 0545  NA 136 135  K 3.7 3.9  CL 102 100  CO2 24 23  GLUCOSE 177* 160*  BUN 7 5*  CREATININE 0.62 0.66  CALCIUM 8.3* 8.4*  MG 1.8 1.8  PHOS 2.9 3.1   Recent Labs    04/12/20 0530 04/13/20 0545  AST 20 20  ALT 21 19  ALKPHOS 70 68  BILITOT 1.0 0.8  PROT 6.6 6.9  ALBUMIN 2.6* 2.7*   Recent Labs    04/11/20 0551 04/12/20 0530 04/13/20 0545  WBC 19.6* 18.1* 16.2*  NEUTROABS 16.2* 15.0*  --   HGB 11.5* 11.4* 11.7*  HCT 34.3* 34.2* 36.4*  MCV 96.3 96.3 98.1  PLT 282 308 366   No  results for input(s): LABPROT, INR in the last 72 hours.    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 -MRI/MRCP 04/12/20 revealed acute hemorrhagic necrotizing pancreatitis involving 40% of the pancreatic parencyma.  Small ill-defined peripancreatic inflammatory fluid collections, largest 4.2 x 2.9 x 3.0 cm -WBCs 16.2, decreased from 18.1 yesterday -Normal kidney function: BUN 5/creatinine 0.66 -Lipase 62 today (1076 on admission) -LFTs remain normal -Albumin 2.7  Plan: Continue IV fluids at current rate (100 cc/hour).  Continue to monitor hemoglobin with CBC daily.    Soft low-fat diet.  Creon 36,000u with meals.  [facility] will follow.  Edrick Kins PA-C 04/13/2020, 9:48 AM  Contact #  [phone]

## 2020-04-13 NOTE — Progress Notes (Signed)
PROGRESS NOTE  Peter [name] RUE:[id number] DOB: 1980/08/19   PCP: Kallie LocksStroud, Natalie M, FNP  Patient is from: Home  DOA: 04/04/2020 LOS: 9  Chief complaints: Abdominal pain  Brief Narrative / Interim history: 40 year old M with PMH of alcohol abuse, HTN, carditis, depression and anxiety presenting with 3 to 4 days of abdominal pain and nausea and admitted to ICU for alcohol withdrawal and severe sepsis due to possible aspiration pneumonia.  He was treated with Precedex and IV antibiotics.  Withdrawal symptoms improved, and [facility]  Patient had persistent pain and leukocytosis although his lipase normalized.  Repeat CT abdomen and pelvis showed with worsening pancreatitis with possible necrosis in pancreatic tail but no pseudocyst.  [facility] consulted.  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   Subjective: Seen and examined earlier this morning.  No major events overnight or this morning.  Continues to endorse abdominal pain.  He rates pain 7/10.  He just received IV Dilaudid.  He denies nausea or vomiting.  Reports loose and oily stool.  Denies chest pain, dyspnea or UTI symptoms.  Objective: Vitals:   04/12/20 1531 04/12/20 2008 04/13/20 0417 04/13/20 1414  BP: (!) 141/87 (!) 156/88 (!) 150/95 (!) 159/92  Pulse: 91 97 (!) 102 99  Resp: 16 18 18 18   Temp: 98.1 F (36.7 C) 98.8 F (37.1 C) 98.2 F (36.8 C) 98.9 F (37.2 C)  TempSrc: Oral Oral Oral Oral  SpO2: 95% 97% 97% 95%  Weight:      Height:        Intake/Output Summary (Last 24 hours) at 04/13/2020 1641 Last data filed at 04/13/2020 1618 Gross per 24 hour  Intake 2830.07 ml  Output -  Net 2830.07 ml   Filed Weights   04/04/20 0004  Weight: 103.4 kg    Examination:  GENERAL: No apparent distress.  Nontoxic. HEENT: MMM.  Vision and hearing grossly intact.  NECK: Supple.  No apparent JVD.  RESP:  No IWOB.  Fair aeration  bilaterally. CVS:  RRR. Heart sounds normal.  ABD/GI/GU: BS+. Abd soft.  Mild tenderness across upper abdomen. MSK/EXT:  Moves extremities. No apparent deformity. No edema.  SKIN: no apparent skin lesion or wound NEURO: Awake, alert and oriented appropriately.  No apparent focal neuro deficit. PSYCH: Calm. Normal affect.   Procedures:  None  Microbiology summarized: Influenza and COVID-19 PCR nonreactive.  Assessment & Plan: Acute hemorrhagic pancreatitis with necrosis-likely due to alcohol.  LFT and renal function within normal.  Mild elevated lipase.  Leukocytosis improving. -Continue IV fluid, IV analgesics -Advance to soft diet by GI. -Started on Creon. -Appreciate help by GI.   Acute respiratory failure with hypoxia-resolved. Severe sepsis due to LLL aspiration pneumonia-POA. Completed 5 days of IV CTX. Sleep Related Hypoxemia -OOB.  PT/OT signed off. -PCCM to arrange outpatient sleep study but wants to be notified prior to discharge  Alcohol abuse with withdrawal-resolved. Encephalopathy/agitation:  Resolved. -Completed Librium taper -Counseled on alcohol cessation.  Hypokalemia/hypomagnesemia/hypophosphatemia-due to alcohol?  Resolved. -Monitor and replenish as appropriate  Abdominal pain/ileus: Resolved.  Having BMs. -Mobilize.  Uncontrolled hypertension/sinus tachycardia-resolved. -Continue home amlodipine, clonidine and Coreg -Resume home losartan at reduced dose -Outpatient sleep study as above  Acute Kidney Injury/hyperkalemia/hyponatremia-Resolved. -IV fluid as above   Anxiety/depression -Continue home Effexor and BuSpar.  Leukocytosis: Improving.  Likely demargination versus infection.  He just completed 5 days of CTX for aspiration pneumonia. -Continue monitoring  Thrombocytopenia: Resolved.  Debility/physical  deconditioning-improved. -PT/OT signed off.  Obesity Body mass index is 32.71 kg/m.         DVT prophylaxis:  On subcu  Lovenox  Code Status: Full code Family Communication: Updated patient's mother at bedside on 2/20. Level of care: Med-Surg Status is: Inpatient  Remains inpatient appropriate because:Ongoing active pain requiring inpatient pain management, Unsafe d/c plan, IV treatments appropriate due to intensity of illness or inability to take PO and Inpatient level of care appropriate due to severity of illness   Dispo: The patient is from: Home              Anticipated d/c is to: Home              Anticipated d/c date is: 2 days              Patient currently is not medically stable to d/c.   Difficult to place patient No       Consultants:  PCCM-signed off Gastroenterology   Sch Meds:  Scheduled Meds: . acetaminophen  1,000 mg Oral Q8H  . amLODipine  10 mg Oral Daily  . busPIRone  7.5 mg Oral TID  . carvedilol  6.25 mg Oral BID WC  . chlorhexidine  15 mL Mouth Rinse BID  . Chlorhexidine Gluconate Cloth  6 each Topical Daily  . cloNIDine  0.2 mg Oral BID  . folic acid  1 mg Oral Daily  . lipase/protease/amylase  36,000 Units Oral TID WC  . losartan  25 mg Oral Daily  . mouth rinse  15 mL Mouth Rinse q12n4p  . mouth rinse  15 mL Mouth Rinse BID  . multivitamin with minerals  1 tablet Oral Daily  . nicotine  14 mg Transdermal Daily  . pantoprazole  40 mg Oral BID  . sodium chloride flush  3 mL Intravenous Q12H  . thiamine  100 mg Oral Daily   Or  . thiamine  100 mg Intravenous Daily  . venlafaxine XR  75 mg Oral Q breakfast   Continuous Infusions: . 0.9 % NaCl with KCl 20 mEq / L Stopped (04/13/20 1548)   PRN Meds:.albuterol, cyclobenzaprine, HYDROmorphone (DILAUDID) injection, labetalol, ondansetron **OR** ondansetron (ZOFRAN) IV, oxyCODONE, polyethylene glycol  Antimicrobials: Anti-infectives (From admission, onward)   Start     Dose/Rate Route Frequency Ordered Stop   04/06/20 1300  cefTRIAXone (ROCEPHIN) 2 g in sodium chloride 0.9 % 100 mL IVPB  Status:  Discontinued         2 g 200 mL/hr over 30 Minutes Intravenous Every 24 hours 04/06/20 1202 04/10/20 1510       I have personally reviewed the following labs and images: CBC: Recent Labs  Lab 04/09/20 0237 04/10/20 0616 04/11/20 0551 04/12/20 0530 04/13/20 0545  WBC 15.8* 17.7* 19.6* 18.1* 16.2*  NEUTROABS 11.8*  --  16.2* 15.0*  --   HGB 11.7* 11.5* 11.5* 11.4* 11.7*  HCT 35.9* 34.2* 34.3* 34.2* 36.4*  MCV 98.9 96.6 96.3 96.3 98.1  PLT 167 201 282 308 366   BMP &GFR Recent Labs  Lab 04/09/20 0237 04/10/20 0616 04/11/20 0551 04/12/20 0530 04/13/20 0545  NA 137 137 135 136 135  K 4.0 3.2* 3.2* 3.7 3.9  CL 100 98 98 102 100  CO2 26 27 26 24 23   GLUCOSE 179* 207* 199* 177* 160*  BUN 10 9 8 7  5*  CREATININE 0.68 0.70 0.72  0.74 0.62 0.66  CALCIUM 8.4* 8.2* 8.3* 8.3* 8.4*  MG 2.0 1.8 2.0  1.8 1.8  PHOS 2.3* 2.0* 2.6 2.9 3.1   Estimated Creatinine Clearance: 149.4 mL/min (by C-G formula based on SCr of 0.66 mg/dL). Liver & Pancreas: Recent Labs  Lab 04/07/20 1034 04/08/20 0533 04/09/20 0237 04/10/20 0616 04/11/20 0551 04/12/20 0530 04/13/20 0545  AST 24 28  --   --  22 20 20   ALT 26 26  --   --  25 21 19   ALKPHOS 73 88  --   --  77 70 68  BILITOT 1.5* 1.4*  --   --  1.0 1.0 0.8  PROT 7.2 7.4  --   --  7.0 6.6 6.9  ALBUMIN 2.9* 3.0* 2.7* 2.6* 2.7* 2.6* 2.7*   Recent Labs  Lab 04/07/20 1034 04/08/20 1851 04/11/20 0938 04/12/20 0530 04/13/20 0545  LIPASE 31 35 63* 55* 62*   No results for input(s): AMMONIA in the last 168 hours. Diabetic: No results for input(s): HGBA1C in the last 72 hours. Recent Labs  Lab 04/12/20 2011 04/13/20 0001 04/13/20 0420 04/13/20 0803 04/13/20 1204  GLUCAP 144* 165* 153* 160* 139*   Cardiac Enzymes: No results for input(s): CKTOTAL, CKMB, CKMBINDEX, TROPONINI in the last 168 hours. No results for input(s): PROBNP in the last 8760 hours. Coagulation Profile: No results for input(s): INR, PROTIME in the last 168 hours. Thyroid  Function Tests: Recent Labs    04/11/20 0551  TSH 3.460   Lipid Profile: No results for input(s): CHOL, HDL, LDLCALC, TRIG, CHOLHDL, LDLDIRECT in the last 72 hours. Anemia Panel: No results for input(s): VITAMINB12, FOLATE, FERRITIN, TIBC, IRON, RETICCTPCT in the last 72 hours. Urine analysis:    Component Value Date/Time   COLORURINE AMBER (A) 04/04/2020 2354   APPEARANCEUR CLEAR 04/04/2020 2354   LABSPEC 1.025 04/04/2020 2354   PHURINE 5.0 04/04/2020 2354   GLUCOSEU 50 (A) 04/04/2020 2354   HGBUR SMALL (A) 04/04/2020 2354   BILIRUBINUR NEGATIVE 04/04/2020 2354   BILIRUBINUR Negative 01/28/2019 1521   KETONESUR 5 (A) 04/04/2020 2354   PROTEINUR 100 (A) 04/04/2020 2354   UROBILINOGEN 0.2 01/28/2019 1521   UROBILINOGEN 0.2 11/13/2016 1234   NITRITE NEGATIVE 04/04/2020 2354   LEUKOCYTESUR NEGATIVE 04/04/2020 2354   Sepsis Labs: Invalid input(s): PROCALCITONIN, LACTICIDVEN  Microbiology: Recent Results (from the past 240 hour(s))  Resp Panel by RT-PCR (Flu A&B, Covid) Nasopharyngeal Swab     Status: None   Collection Time: 04/04/20  6:49 AM   Specimen: Nasopharyngeal Swab; Nasopharyngeal(NP) swabs in vial transport medium  Result Value Ref Range Status   SARS Coronavirus 2 by RT PCR NEGATIVE NEGATIVE Final    Comment: (NOTE) SARS-CoV-2 target nucleic acids are NOT DETECTED.  The SARS-CoV-2 RNA is generally detectable in upper respiratory specimens during the acute phase of infection. The lowest concentration of SARS-CoV-2 viral copies this assay can detect is 138 copies/mL. A negative result does not preclude SARS-Cov-2 infection and should not be used as the sole basis for treatment or other patient management decisions. A negative result may occur with  improper specimen collection/handling, submission of specimen other than nasopharyngeal swab, presence of viral mutation(s) within the areas targeted by this assay, and inadequate number of viral copies(<138  copies/mL). A negative result must be combined with clinical observations, patient history, and epidemiological information. The expected result is Negative.  Fact Sheet for Patients:  04/06/2020  Fact Sheet for Healthcare Providers:  04/06/20  This test is no t yet approved or cleared by the [employer] and  has been authorized  for detection and/or diagnosis of SARS-CoV-2 by FDA under an Emergency Use Authorization (EUA). This EUA will remain  in effect (meaning this test can be used) for the duration of the COVID-19 declaration under Section 564(b)(1) of the Act, 21 U.S.C.section 360bbb-3(b)(1), unless the authorization is terminated  or revoked sooner.       Influenza A by PCR NEGATIVE NEGATIVE Final   Influenza B by PCR NEGATIVE NEGATIVE Final    Comment: (NOTE) The Xpert Xpress SARS-CoV-2/FLU/RSV plus assay is intended as an aid in the diagnosis of influenza from Nasopharyngeal swab specimens and should not be used as a sole basis for treatment. Nasal washings and aspirates are unacceptable for Xpert Xpress SARS-CoV-2/FLU/RSV testing.  Fact Sheet for Patients: [employer]  Fact Sheet for Healthcare Providers: [employer]  This test is not yet approved or cleared by the [place] FDA and has been authorized for detection and/or diagnosis of SARS-CoV-2 by FDA under an Emergency Use Authorization (EUA). This EUA will remain in effect (meaning this test can be used) for the duration of the COVID-19 declaration under Section 564(b)(1) of the Act, 21 U.S.C. section 360bbb-3(b)(1), unless the authorization is terminated or revoked.  Performed at [facility], [address], [place], [place] [address]   MRSA PCR Screening     Status: None   Collection Time: 04/04/20  5:19 PM   Specimen: Nasal Mucosa; Nasopharyngeal   Result Value Ref Range Status   MRSA by PCR NEGATIVE NEGATIVE Final    Comment:        The GeneXpert MRSA Assay (FDA approved for NASAL specimens only), is one component of a comprehensive MRSA colonization surveillance program. It is not intended to diagnose MRSA infection nor to guide or monitor treatment for MRSA infections. Performed at [facility], [address], [place], [place] [address]     Radiology Studies: No results found.     Taye T. Gonfa [facility]  If 7PM-7AM, please contact night-coverage [email or web address] 04/13/2020, 4:41 PM

## 2020-04-14 LAB — GLUCOSE, CAPILLARY
Glucose-Capillary: 128 mg/dL — ABNORMAL HIGH (ref 70–99)
Glucose-Capillary: 133 mg/dL — ABNORMAL HIGH (ref 70–99)
Glucose-Capillary: 137 mg/dL — ABNORMAL HIGH (ref 70–99)
Glucose-Capillary: 146 mg/dL — ABNORMAL HIGH (ref 70–99)
Glucose-Capillary: 172 mg/dL — ABNORMAL HIGH (ref 70–99)
Glucose-Capillary: 179 mg/dL — ABNORMAL HIGH (ref 70–99)

## 2020-04-14 LAB — CBC
HCT: 34.7 % — ABNORMAL LOW (ref 39.0–52.0)
Hemoglobin: 11.5 g/dL — ABNORMAL LOW (ref 13.0–17.0)
MCH: 32 pg (ref 26.0–34.0)
MCHC: 33.1 g/dL (ref 30.0–36.0)
MCV: 96.7 fL (ref 80.0–100.0)
Platelets: 373 10*3/uL (ref 150–400)
RBC: 3.59 MIL/uL — ABNORMAL LOW (ref 4.22–5.81)
RDW: 14.4 % (ref 11.5–15.5)
WBC: 15.2 10*3/uL — ABNORMAL HIGH (ref 4.0–10.5)
nRBC: 0 % (ref 0.0–0.2)

## 2020-04-14 LAB — COMPREHENSIVE METABOLIC PANEL
ALT: 19 U/L (ref 0–44)
AST: 21 U/L (ref 15–41)
Albumin: 2.7 g/dL — ABNORMAL LOW (ref 3.5–5.0)
Alkaline Phosphatase: 66 U/L (ref 38–126)
Anion gap: 12 (ref 5–15)
BUN: 6 mg/dL (ref 6–20)
CO2: 23 mmol/L (ref 22–32)
Calcium: 8.3 mg/dL — ABNORMAL LOW (ref 8.9–10.3)
Chloride: 96 mmol/L — ABNORMAL LOW (ref 98–111)
Creatinine, Ser: 0.7 mg/dL (ref 0.61–1.24)
GFR, Estimated: >60 mL/min (ref >60)
Glucose, Bld: 187 mg/dL — ABNORMAL HIGH (ref 70–99)
Potassium: 3.9 mmol/L (ref 3.5–5.1)
Sodium: 131 mmol/L — ABNORMAL LOW (ref 135–145)
Total Bilirubin: 1.1 mg/dL (ref 0.3–1.2)
Total Protein: 7 g/dL (ref 6.5–8.1)

## 2020-04-14 LAB — PHOSPHORUS: Phosphorus: 3 mg/dL (ref 2.5–4.6)

## 2020-04-14 LAB — LIPASE, BLOOD: Lipase: 68 U/L — ABNORMAL HIGH (ref 11–51)

## 2020-04-14 LAB — MAGNESIUM: Magnesium: 1.8 mg/dL (ref 1.7–2.4)

## 2020-04-14 MED ORDER — OXYCODONE HCL 5 MG PO TABS
10.0000 mg | ORAL_TABLET | ORAL | Status: DC | PRN
  Administered 2020-04-14 – 2020-04-15 (×3): 10 mg via ORAL
  Filled 2020-04-14 (×3): qty 2

## 2020-04-14 MED ORDER — HYDROMORPHONE HCL 1 MG/ML IJ SOLN
0.5000 mg | Freq: Four times a day (QID) | INTRAMUSCULAR | Status: DC | PRN
  Administered 2020-04-14 – 2020-04-15 (×3): 0.5 mg via INTRAVENOUS
  Filled 2020-04-14 (×3): qty 0.5

## 2020-04-14 MED ORDER — CLONIDINE HCL 0.2 MG PO TABS
0.2000 mg | ORAL_TABLET | Freq: Two times a day (BID) | ORAL | Status: DC
  Administered 2020-04-14 – 2020-04-15 (×2): 0.2 mg via ORAL
  Filled 2020-04-14 (×2): qty 1

## 2020-04-14 NOTE — Progress Notes (Signed)
[facility] Gastroenterology Progress Note  Peter [name] 40 y.o. 1980-05-04  CC:  Acute hemorrhagic necrotizing pancreatitis  Subjective: Patient reports feeling about the same today as he did yesterday.  Continues to have epigastric pain and loose stools. Denies nausea/vomiting and is tolerating a soft diet.  He states he ate approximately 60% of his dinner (omelet, pancakes) and is awaiting breakfast.  ROS : Review of Systems  Cardiovascular: Negative for chest pain and palpitations.  Gastrointestinal: Positive for abdominal pain and diarrhea. Negative for blood in stool, constipation, heartburn, melena, nausea and vomiting.    Objective: Vital signs in last 24 hours: Vitals:   04/13/20 1927 04/14/20 0517  BP: (!) 169/100 (!) 160/105  Pulse: 97 96  Resp: 20 20  Temp: 99.2 F (37.3 C) 98.9 F (37.2 C)  SpO2: 96% 96%    Physical Exam:  General:  Alert, oriented, ooperative, no distress  Head:  Normocephalic, without obvious abnormality, atraumatic  Eyes:  Anicteric sclera, EOMs intact  Lungs:   Clear to auscultation bilaterally, respirations unlabored  Heart:  Regular rate and rhythm, S1, S2 normal  Abdomen:   Soft with mild firmness in the epigastrium, mild epigastric tenderness on palpation.  No guarding or peritoneal signs. Normoactive bowel sounds  Extremities: Extremities normal, atraumatic, no  edema  Pulses: 2+ and symmetric    Lab Results: Recent Labs    04/13/20 0545 04/14/20 0546  NA 135 131*  K 3.9 3.9  CL 100 96*  CO2 23 23  GLUCOSE 160* 187*  BUN 5* 6  CREATININE 0.66 0.70  CALCIUM 8.4* 8.3*  MG 1.8 1.8  PHOS 3.1 3.0   Recent Labs    04/13/20 0545 04/14/20 0546  AST 20 21  ALT 19 19  ALKPHOS 68 66  BILITOT 0.8 1.1  PROT 6.9 7.0  ALBUMIN 2.7* 2.7*   Recent Labs    04/12/20 0530 04/13/20 0545 04/14/20 0546  WBC 18.1* 16.2* 15.2*  NEUTROABS 15.0*  --   --   HGB 11.4* 11.7* 11.5*  HCT 34.2* 36.4* 34.7*  MCV 96.3 98.1 96.7  PLT 308 366  373   No results for input(s): LABPROT, INR in the last 72 hours.    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 -MRI/MRCP 04/12/20 revealed acute hemorrhagic necrotizing pancreatitis involving 40% of the pancreatic parencyma.  Small ill-defined peripancreatic inflammatory fluid collections, largest 4.2 x 2.9 x 3.0 cm -Hgb 11.5, stable -WBCs 15.2, gradually decreasing -Normal kidney function: BUN 6/creatinine 0.70 -Lipase 68 today (1076 on admission) -LFTs remain normal -Albumin 2.7  Plan: If patient can be managed on PO pain regimen, plan for discharge tomorrow, assuming Hgb remains stable, (with FU in our office with repeat imaging in 3-4 weeks).  Continue low-fat diet and Creon 36,000u with meals.  [facility] will follow.  Edrick Kins PA-C 04/14/2020, 10:45 AM  Contact #  [phone]

## 2020-04-14 NOTE — Progress Notes (Signed)
PROGRESS NOTE    Peter [name]  ZRA:[id number] DOB: Dec 13, 1980 DOA: 04/04/2020 PCP: Kallie Locks, FNP    Brief Narrative:  Peter [name] was admitted to the hospital with a working diagnosis of acute hemorrhagic pancreatitis with necrosis.  40 year old male past medical history of alcohol abuse, hypertension and pancreatitis. Patient reported constant, severe, epigastric abdominal pain for 3-4 days.  Positive daily alcohol consumption.  On his initial physical examination blood pressure 168/96, heart rate 149, respiratory rate 40, oxygenation 91%, lungs are clear to auscultation bilaterally, heart S1-S2, present, tachycardic, abdomen distended, tender to palpation, no lower extremity edema.  During his hospitalization he developed severe alcohol withdrawal, complicated by aspiration pneumonia and severe sepsis. Patient required continuous infusion of dexmedetomidine in the intensive care unit to control his withdrawal symptoms.  Further work-up with abdominal CTA showed worsening pancreatitis with possible necrosis.  Abdominal MRI showed necrotizing pancreatitis with hemorrhage involving 40% of pancreatic parenchyma.   Assessment & Plan:   Active Problems:   Alcohol dependence with other alcohol-induced disorder (HCC)   Essential hypertension   Acute pancreatitis   Lactic acidosis   Pancreatitis, acute   1. Acute hemorrhagic/ necrotic alcoholic pancreatitis. Pain is persistent, improved with IV hydromorphone. No nausea or vomiting and tolerating soft diet. Continue with acetaminophen. Will increase oxycodone to 10 mg as needed and decrease the frequency of hydromorphone.  Discontinue IV fluids. Continue with Creon.   Out of bed to chair tid with meals.   2. Alcohol withdrawal syndrome. No further tremors or anxiety, continue with as needed benzodiazepines. Continue with thiamine and multivitamins.   3. Severe sepsis due to left lower lobe aspiration pneumonia. Completed  antibiotic therapy with good toleration.  Clinically has resolved.   4. AKI, hyperkalemia and hyponatremia. Renal function stable, discontinue IV fluids, patient is tolerating po well.  5. HTN. Continue blood pressure control with amlodipine, losartan, clonidine and carvedilol.   6. Depression. Continue with venlafaxone, buspirone,. .    Status is: Inpatient  Remains inpatient appropriate because:IV treatments appropriate due to intensity of illness or inability to take PO   Dispo: The patient is from: Home              Anticipated d/c is to: Home              Anticipated d/c date is: 1 day              Patient currently is not medically stable to d/c.   Difficult to place patient No  DVT prophylaxis: scd  Code Status:   full  Family Communication:  No family at the bedside       Subjective: Patient is feeling better, no nausea or vomiting, his abdominal pain is better with hydromorphone, tolerating po well.   Objective: Vitals:   04/13/20 1414 04/13/20 1927 04/14/20 0517 04/14/20 1320  BP: (!) 159/92 (!) 169/100 (!) 160/105 (!) 143/84  Pulse: 99 97 96 97  Resp: 18 20 20 18   Temp: 98.9 F (37.2 C) 99.2 F (37.3 C) 98.9 F (37.2 C) 98.5 F (36.9 C)  TempSrc: Oral Oral Oral Oral  SpO2: 95% 96% 96%   Weight:      Height:        Intake/Output Summary (Last 24 hours) at 04/14/2020 1530 Last data filed at 04/13/2020 1812 Gross per 24 hour  Intake 1259.97 ml  Output --  Net 1259.97 ml   Filed Weights   04/04/20 0004  Weight: 103.4 kg  Examination:   General: Not in pain or dyspnea, deconditioned  Neurology: Awake and alert, non focal  E ENT: mild pallor, no icterus, oral mucosa moist Cardiovascular: No JVD. S1-S2 present, rhythmic, no gallops, rubs, or murmurs. No lower extremity edema. Pulmonary: vesicular breath sounds bilaterally, adequate air movement, no wheezing, rhonchi or rales. Gastrointestinal. Abdomen soft and non tender Skin. No  rashes Musculoskeletal: no joint deformities     Data Reviewed: I have personally reviewed following labs and imaging studies  CBC: Recent Labs  Lab 04/09/20 0237 04/10/20 0616 04/11/20 0551 04/12/20 0530 04/13/20 0545 04/14/20 0546  WBC 15.8* 17.7* 19.6* 18.1* 16.2* 15.2*  NEUTROABS 11.8*  --  16.2* 15.0*  --   --   HGB 11.7* 11.5* 11.5* 11.4* 11.7* 11.5*  HCT 35.9* 34.2* 34.3* 34.2* 36.4* 34.7*  MCV 98.9 96.6 96.3 96.3 98.1 96.7  PLT 167 201 282 308 366 373   Basic Metabolic Panel: Recent Labs  Lab 04/10/20 0616 04/11/20 0551 04/12/20 0530 04/13/20 0545 04/14/20 0546  NA 137 135 136 135 131*  K 3.2* 3.2* 3.7 3.9 3.9  CL 98 98 102 100 96*  CO2 27 26 24 23 23   GLUCOSE 207* 199* 177* 160* 187*  BUN 9 8 7  5* 6  CREATININE 0.70 0.72  0.74 0.62 0.66 0.70  CALCIUM 8.2* 8.3* 8.3* 8.4* 8.3*  MG 1.8 2.0 1.8 1.8 1.8  PHOS 2.0* 2.6 2.9 3.1 3.0   GFR: Estimated Creatinine Clearance: 149.4 mL/min (by C-G formula based on SCr of 0.7 mg/dL). Liver Function Tests: Recent Labs  Lab 04/08/20 0533 04/09/20 0237 04/10/20 0616 04/11/20 0551 04/12/20 0530 04/13/20 0545 04/14/20 0546  AST 28  --   --  22 20 20 21   ALT 26  --   --  25 21 19 19   ALKPHOS 88  --   --  77 70 68 66  BILITOT 1.4*  --   --  1.0 1.0 0.8 1.1  PROT 7.4  --   --  7.0 6.6 6.9 7.0  ALBUMIN 3.0*   < > 2.6* 2.7* 2.6* 2.7* 2.7*   < > = values in this interval not displayed.   Recent Labs  Lab 04/08/20 1851 04/11/20 0938 04/12/20 0530 04/13/20 0545 04/14/20 0546  LIPASE 35 63* 55* 62* 68*   No results for input(s): AMMONIA in the last 168 hours. Coagulation Profile: No results for input(s): INR, PROTIME in the last 168 hours. Cardiac Enzymes: No results for input(s): CKTOTAL, CKMB, CKMBINDEX, TROPONINI in the last 168 hours. BNP (last 3 results) No results for input(s): PROBNP in the last 8760 hours. HbA1C: No results for input(s): HGBA1C in the last 72 hours. CBG: Recent Labs  Lab  04/13/20 1916 04/13/20 2328 04/14/20 0514 04/14/20 0751 04/14/20 1202  GLUCAP 156* 153* 179* 172* 128*   Lipid Profile: No results for input(s): CHOL, HDL, LDLCALC, TRIG, CHOLHDL, LDLDIRECT in the last 72 hours. Thyroid Function Tests: No results for input(s): TSH, T4TOTAL, FREET4, T3FREE, THYROIDAB in the last 72 hours. Anemia Panel: No results for input(s): VITAMINB12, FOLATE, FERRITIN, TIBC, IRON, RETICCTPCT in the last 72 hours.    Radiology Studies: I have reviewed all of the imaging during this hospital visit personally     Scheduled Meds: . acetaminophen  1,000 mg Oral Q8H  . amLODipine  10 mg Oral Daily  . busPIRone  7.5 mg Oral TID  . carvedilol  6.25 mg Oral BID WC  . chlorhexidine  15 mL Mouth Rinse  BID  . Chlorhexidine Gluconate Cloth  6 each Topical Daily  . cloNIDine  0.2 mg Oral BID  . folic acid  1 mg Oral Daily  . lipase/protease/amylase  36,000 Units Oral TID WC  . losartan  25 mg Oral Daily  . mouth rinse  15 mL Mouth Rinse q12n4p  . mouth rinse  15 mL Mouth Rinse BID  . multivitamin with minerals  1 tablet Oral Daily  . nicotine  14 mg Transdermal Daily  . pantoprazole  40 mg Oral BID  . sodium chloride flush  3 mL Intravenous Q12H  . thiamine  100 mg Oral Daily   Or  . thiamine  100 mg Intravenous Daily  . venlafaxine XR  75 mg Oral Q breakfast   Continuous Infusions: . 0.9 % NaCl with KCl 20 mEq / L 100 mL/hr at 04/14/20 1151     LOS: 10 days        Mikaelyn Arthurs Annett Gula, MD

## 2020-04-15 LAB — GLUCOSE, CAPILLARY
Glucose-Capillary: 131 mg/dL — ABNORMAL HIGH (ref 70–99)
Glucose-Capillary: 151 mg/dL — ABNORMAL HIGH (ref 70–99)

## 2020-04-15 MED ORDER — VENLAFAXINE HCL ER 75 MG PO CP24: 75.0000 mg | ORAL_CAPSULE | Freq: Every day | ORAL | 0 refills | Status: DC

## 2020-04-15 MED ORDER — ACETAMINOPHEN 325 MG PO TABS: 650.0000 mg | ORAL_TABLET | Freq: Four times a day (QID) | ORAL | 0 refills | Status: DC | PRN

## 2020-04-15 MED ORDER — PANTOPRAZOLE SODIUM 40 MG PO TBEC: 40.0000 mg | DELAYED_RELEASE_TABLET | Freq: Every day | ORAL | 0 refills | Status: DC

## 2020-04-15 MED ORDER — CLONIDINE HCL 0.2 MG PO TABS: 0.2000 mg | ORAL_TABLET | Freq: Three times a day (TID) | ORAL | 0 refills | Status: DC

## 2020-04-15 MED ORDER — METOPROLOL TARTRATE 25 MG PO TABS: 25.0000 mg | ORAL_TABLET | Freq: Two times a day (BID) | ORAL | 0 refills | Status: DC

## 2020-04-15 MED ORDER — AMLODIPINE BESYLATE 10 MG PO TABS: 10.0000 mg | ORAL_TABLET | Freq: Every day | ORAL | 0 refills | Status: DC

## 2020-04-15 MED ORDER — OXYCODONE HCL 5 MG PO TABS: 5.0000 mg | ORAL_TABLET | Freq: Four times a day (QID) | ORAL | 0 refills | Status: DC | PRN

## 2020-04-15 MED ORDER — BUSPIRONE HCL 7.5 MG PO TABS: 7.5000 mg | ORAL_TABLET | Freq: Three times a day (TID) | ORAL | 0 refills | Status: DC

## 2020-04-15 MED ORDER — PANCRELIPASE (LIP-PROT-AMYL) 36000-114000 UNITS PO CPEP: 36000.0000 [IU] | ORAL_CAPSULE | Freq: Three times a day (TID) | ORAL | 0 refills | Status: AC

## 2020-04-15 MED ORDER — LOSARTAN POTASSIUM 25 MG PO TABS: 25.0000 mg | ORAL_TABLET | Freq: Every day | ORAL | 0 refills | Status: DC

## 2020-04-15 NOTE — Discharge Summary (Addendum)
Physician Discharge Summary  Peter [name] DGL:[id number] DOB: [birth year] DOA: 04/04/2020  PCP: Kallie Locks, FNP  Admit date: 04/04/2020 Discharge date: 04/15/2020  Admitted From: Home  Disposition:  Home   Recommendations for Outpatient Follow-up and new medication changes:  1. Follow up with Raliegh Ip in 7 days.  2. Patient needs repeat abdominal imaging, CT abdomen in 2 to 3 weeks for follow up. 3. Continue with pancreatic enzymes. 4. Follow with GI as scheduled.   Home Health: no   Equipment/Devices: no    Discharge Condition: stable  CODE STATUS:  full Diet recommendation: soft and advance as tolerated.   Brief/Interim Summary: [name] was admitted to the hospital with a working diagnosis of acute alcoholic hemorrhagic pancreatitis with necrosis.  40 year old male past medical history of alcohol abuse, hypertension and pancreatitis. Patient reported constant, severe, epigastric abdominal pain for 3-4 days.  Positive daily alcohol consumption.  On his initial physical examination blood pressure 168/96, heart rate 149, respiratory rate 40, oxygenation 91%, lungs were clear to auscultation bilaterally, heart S1-S2, present, tachycardic, abdomen distended, tender to palpation, no lower extremity edema.  Sodium 133, potassium 3.7, chloride 95, bicarb 15, glucose 277, BUN 9, creatinine 0.94, anion gap 23, lipase 1076, lactic acid 10, white count 24.8, hemoglobin 17.1, hematocrit 49.3, platelets 204. SARS COVID-19 negative. Urinalysis specific gravity 1.025, 6-10 white cells, 0-5 red cells. Alcohol level less than 10  EKG 143 bpm, normal axis, normal intervals, sinus rhythm, no significant ST segment or T wave changes.  CT of the abdomen with acute pancreatitis.  Severe hepatic steatosis. Chest radiograph with hypoinflation, right base atelectasis.  During his hospitalization he developed severe alcohol withdrawal, complicated by aspiration pneumonia and severe  sepsis. Patient required continuous infusion of dexmedetomidine in the intensive care unit to control his withdrawal symptoms.  Further work-up with abdominal CTA showed worsening pancreatitis with possible necrosis.  Abdominal MRI showed necrotizing pancreatitis with hemorrhage involving 40% of pancreatic parenchyma  With supportive medical therapy his symptoms slowly improved, and his diet has been advanced with good toleration.  Patient will need follow-up as an outpatient in 2 to 3 weeks for abdominal imaging, continue pancreatic enzymes.  Discharge Diagnoses:  Active Problems:   Alcohol dependence with other alcohol-induced disorder (HCC)   Essential hypertension   Acute pancreatitis   Lactic acidosis   Pancreatitis, acute  1.  Acute alcoholic hemorrhagic/necrotic pancreatitis.  Patient had a prolonged hospitalization, including a stay in the intensive care unit.  Patient received intravenous fluids, IV analgesics, antiacids and antiemetics.  His symptoms slowly improved, his was advanced, currently on Creon. Patient will need follow-up imaging in 2 to 3 weeks.  2.  Acute alcohol withdrawal syndrome.  Severe withdrawal symptoms, refractory to benzodiazepines.  Patient required infusion of dexmedetomidine in the intensive care unit. At time of discharge his withdrawal symptoms have resolved, he was placed on thiamine and multivitamins.  3.  Severe sepsis due to left lower lobe aspiration pneumonia.  Patient received broad-spectrum antibiotic therapy and supplemental oxygen per nasal cannula. He responded well to medical therapy.  4.  Acute kidney injury with hyperkalemia/hyponatremia, non-anion gap metabolic acidosis/lactic acidosis.  Patient received supportive medical therapy including intravenous fluids, his electrolytes were corrected. At the time of discharge sodium 131, potassium 3.9, chloride 96, bicarb 23, glucose 187, BUN 6, creatinine 0.7.  5.  Hypertension. (Difficult  to control) continue blood pressure control with amlodipine, losartan, clonidine and carvedilol.  6.  Depression.  He will continue venlafaxine  and buspirone.   Discharge Instructions   Allergies as of 04/15/2020   No Known Allergies     Medication List    STOP taking these medications   aspirin EC 81 MG tablet   folic acid 1 MG tablet Commonly known as: FOLVITE   hydrALAZINE 50 MG tablet Commonly known as: APRESOLINE   magnesium oxide 400 MG tablet Commonly known as: MAG-OX   OVER THE COUNTER MEDICATION   thiamine 100 MG tablet   Vicks DayQuil Cold & Flu 10-5-325 MG/15ML Liqd Generic drug: DM-Phenylephrine-Acetaminophen     TAKE these medications   acetaminophen 325 MG tablet Commonly known as: TYLENOL Take 2 tablets (650 mg total) by mouth every 6 (six) hours as needed for moderate pain.   amLODipine 10 MG tablet Commonly known as: NORVASC Take 1 tablet (10 mg total) by mouth daily. What changed: Another medication with the same name was removed. Continue taking this medication, and follow the directions you see here.   busPIRone 7.5 MG tablet Commonly known as: BUSPAR Take 1 tablet (7.5 mg total) by mouth 3 (three) times daily.   cloNIDine 0.2 MG tablet Commonly known as: CATAPRES Take 1 tablet (0.2 mg total) by mouth 3 (three) times daily. What changed: Another medication with the same name was removed. Continue taking this medication, and follow the directions you see here.   lipase/protease/amylase [id number] UNITS Cpep capsule Commonly known as: CREON Take 1 capsule (36,000 Units total) by mouth 3 (three) times daily with meals.   losartan 25 MG tablet Commonly known as: COZAAR Take 1 tablet (25 mg total) by mouth daily. Start taking on: April 16, 2020 What changed:   medication strength  how much to take   metoprolol tartrate 25 MG tablet Commonly known as: LOPRESSOR Take 1 tablet (25 mg total) by mouth 2 (two) times daily. Patient to keep  follow up appointment to receive additional refills.   oxyCODONE 5 MG immediate release tablet Commonly known as: Oxy IR/ROXICODONE Take 1 tablet (5 mg total) by mouth every 6 (six) hours as needed for severe pain.   pantoprazole 40 MG tablet Commonly known as: PROTONIX Take 1 tablet (40 mg total) by mouth daily.   venlafaxine XR 75 MG 24 hr capsule Commonly known as: EFFEXOR-XR Take 1 capsule (75 mg total) by mouth daily with breakfast. What changed: Another medication with the same name was added. Make sure you understand how and when to take each.   venlafaxine XR 75 MG 24 hr capsule Commonly known as: EFFEXOR-XR Take 1 capsule (75 mg total) by mouth daily with breakfast. What changed: You were already taking a medication with the same name, and this prescription was added. Make sure you understand how and when to take each.       No Known Allergies  Consultations:  GI   CC   Procedures/Studies: CT ABDOMEN PELVIS W CONTRAST  Result Date: 04/11/2020 CLINICAL DATA:  Acute abdominal pain and now with some back pain, follow-up pancreatitis EXAM: CT ABDOMEN AND PELVIS WITH CONTRAST TECHNIQUE: Multidetector CT imaging of the abdomen and pelvis was performed using the standard protocol following bolus administration of intravenous contrast. Sagittal and coronal MPR images reconstructed from axial data set. CONTRAST:  100mL OMNIPAQUE IOHEXOL 300 MG/ML SOLN IV. No oral contrast. COMPARISON:  04/04/2020 FINDINGS: Lower chest: Small bibasilar pleural effusions with compressive atelectasis of LEFT lower lobe. Hepatobiliary: Fatty infiltration of liver. Dependent tiny calculi within gallbladder. No focal hepatic mass or definite biliary dilatation. Focal  sparing adjacent to gallbladder fossa. Pancreas: Enlarged edematous pancreas with extensive surrounding inflammatory changes of consistent with acute pancreatitis. Pancreatic tail demonstrates low-attenuation at L lack of enhancement versus  prior study, could represent severe edema or pancreatic necrosis. No ductal dilatation or pancreatic hemorrhage. No new pancreatic fluid collections. Increased pancreatic edema along anterior pararenal fascia into LEFT lateral conal fascia and adjacent to duodenum. Spleen: Normal appearance Adrenals/Urinary Tract: Adrenal glands, kidneys, ureters, and bladder normal appearance Stomach/Bowel: Mild gastric wall thickening. Thickening of splenic flexure of colon and descending colon. Remaining bowel loops unremarkable. Appendix not identified. Vascular/Lymphatic: Small filling defect is seen centrally within the portal vein consistent with nonocclusive portal thrombosis. Remaining vascular structures patent. Scattered normal size mesenteric lymph nodes. Reproductive: Unremarkable prostate gland and seminal vesicles Other: No free air. Scattered minimal free fluid. Tiny umbilical hernia containing fat. Musculoskeletal: No acute osseous findings IMPRESSION: Increased size of pancreas since prior study with increased peripancreatic inflammatory changes consistent with worsening of pancreatitis. Greater enlargement and low-attenuation of the pancreatic tail versus previous exam, either representing increased pancreatic edema or developing pancreatic necrosis. Nonocclusive thrombus within portal vein. No discrete drainable fluid collection identified. Associated wall thickening of stomach and descending colon/splenic flexure. Cholelithiasis. Fatty infiltration of liver with focal sparing adjacent to gallbladder fossa. Small bibasilar pleural effusions. Findings called to Dr. Alanda Slim On 04/11/2020 at 1257 hours. Electronically Signed   By: Ulyses Southward M.D.   On: 04/11/2020 13:21   CT Abdomen Pelvis W Contrast  Result Date: 04/04/2020 CLINICAL DATA:  40 year old male with abdominal pain. Alcohol withdrawal. EXAM: CT ABDOMEN AND PELVIS WITH CONTRAST TECHNIQUE: Multidetector CT imaging of the abdomen and pelvis was performed  using the standard protocol following bolus administration of intravenous contrast. CONTRAST:  OMNIPAQUE IOHEXOL 300 MG/ML  SOLN COMPARISON:  CT Abdomen and Pelvis 11/28/2017. [facility] Noncontrast CT Abdomen and Pelvis 09/08/2019 FINDINGS: Lower chest: Lower lung volumes. Otherwise negative. No pericardial or pleural effusion. There is mild fluid distension of the distal thoracic esophagus. Hepatobiliary: Severe hepatic steatosis. Negative gallbladder. No bile duct enlargement. Pancreas: Confluent peripancreatic inflammation, most pronounced at the head and uncinate. No pancreatic ductal dilatation. Pancreatic enhancement remains within normal limits, no necrosis identified. Inflammation in the lesser sac, root of the small bowel mesentery, and greater omentum with small volume mostly layering free fluid in the upper abdomen. No rim enhancing or organized fluid collection. Spleen: Normal enhancement. Adjacent inflammation and small volume free fluid. No definite perisplenic varices. Adrenals/Urinary Tract: Negative adrenal glands, kidneys and ureters. Dilated urinary bladder. Volume estimated at 780 mL. No perivesical stranding. Incidental pelvic phleboliths. Stomach/Bowel: Large bowel is decompressed from the hepatic flexure distally. Small volume of low-density free fluid layering in the gutters, more so the left. No large bowel inflammation identified. Flocculated material in the terminal ileum which otherwise appears negative. Appendix not delineated. No dilated small bowel. Trace oral contrast in the distal stomach and duodenum. Secondary inflammation of the duodenum and ligament of Treitz. Free fluid layering in the lower abdomen and at the pelvic inlet. No free air. Vascular/Lymphatic: Major arterial structures in the abdomen and pelvis are patent with minimal atherosclerosis. Portal vein is patent but diminutive, and the splenic vein is indistinct at the  level of the pancreatic tail. The splenic vein in the midline remains patent. SMV is patent. Systemic central venous structures in the abdomen and pelvis also appear patent. No lymphadenopathy. Reproductive: Negative. Other: No free fluid in the  deep pelvis. Musculoskeletal: No acute osseous abnormality identified. IMPRESSION: 1. Acute Pancreatitis. Upper abdominal inflammation with small volume free fluid in the abdomen, pelvic inlet. No pancreatic necrosis. No organized or drainable fluid collection at this time. Attenuated splenic vein, but no definite vascular thrombosis at this time. 2. Dilated urinary bladder, estimated at 780 mL. 3. Severe hepatic steatosis. Electronically Signed   By: Odessa Fleming M.D.   On: 04/04/2020 04:55   MR 3D Recon At Scanner  Result Date: 04/12/2020 CLINICAL DATA:  Inpatient.  Acute pancreatitis.  Alcohol abuse. EXAM: MRI ABDOMEN WITHOUT AND WITH CONTRAST (INCLUDING MRCP) TECHNIQUE: Multiplanar multisequence MR imaging of the abdomen was performed both before and after the administration of intravenous contrast. Heavily T2-weighted images of the biliary and pancreatic ducts were obtained, and three-dimensional MRCP images were rendered by post processing. CONTRAST:  10mL GADAVIST GADOBUTROL 1 MMOL/ML IV SOLN COMPARISON:  04/11/2020 CT abdomen/pelvis. FINDINGS: Lower chest: Small dependent bilateral pleural effusions, unchanged from CT study from 1 day prior. Hepatobiliary: Top-normal liver size. No liver surface irregularity. Moderate diffuse hepatic steatosis. Subcentimeter simple segment 6 right liver cyst. No suspicious liver masses. A few tiny layering 2-3 mm gallstones in the otherwise normal gallbladder with no gallbladder wall thickening or pericholecystic fluid. No biliary ductal dilatation. Common bile duct diameter 4 mm. No choledocholithiasis. No biliary masses, strictures or beading. Pancreas: There is diffuse expansion of pancreas, most prominent in the pancreatic tail.  There is abnormal mixed signal intensity throughout the expanded pancreatic tail with areas of patchy pre-contrast T1 hyperintensity. There is absence of parenchymal enhancement throughout much of the pancreatic tail. Findings are compatible with acute hemorrhagic necrotizing pancreatitis. There are similar less prominent regions of absent parenchymal enhancement and mixed signal intensity in pancreatico-duodenal groove in the pancreatic head also compatible with necrotizing pancreatitis. Overall, approximately 40% of pancreatic parenchyma appears necrotic. No discrete pancreatic mass. No pancreatic duct dilation. No pancreas divisum. A few small ill-defined peripancreatic inflammatory fluid collections are identified, largest 4.2 x 2.9 x 3.0 cm at the inferior margin of the pancreatic tail (series 3/image 30). Spleen: Top normal size spleen.  No splenic mass. Adrenals/Urinary Tract: Normal adrenals. No hydronephrosis. Simple subcentimeter lower left renal cortical cyst. Otherwise normal kidneys, with no suspicious renal masses. Stomach/Bowel: Normal non-distended stomach. Visualized small and large bowel is normal caliber, with no bowel wall thickening. Vascular/Lymphatic: Normal caliber abdominal aorta. 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 Splenic vein appears occluded. Patent hepatic and renal veins. No pathologically enlarged lymph nodes in the abdomen. Other: No abdominal ascites. Musculoskeletal: No aggressive appearing focal osseous lesions. IMPRESSION: 1. Acute hemorrhagic necrotizing pancreatitis predominantly involving the pancreatic tail and pancreatico-duodenal groove (approximately 40% pancreatic parenchymal necrosis). Small ill-defined peripancreatic inflammatory fluid collections, largest 4.2 x 2.9 x 3.0 cm at the inferior margin of the pancreatic tail. No pancreatic duct dilation. 2. 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 3. Moderate  diffuse hepatic steatosis. 4. Cholelithiasis. No biliary ductal dilatation. No choledocholithiasis. 5. Small dependent bilateral pleural effusions, unchanged from CT study from 1 day prior. Electronically Signed   By: Delbert Phenix M.D.   On: 04/12/2020 08:43   DG Chest Port 1 View  Result Date: 04/09/2020 CLINICAL DATA:  Cough.  Hypertension. EXAM: PORTABLE CHEST 1 VIEW COMPARISON:  April 05, 2020 FINDINGS: There is atelectatic change in the left base with a small left pleural effusion. The lungs elsewhere are clear. There is cardiomegaly, stable, with pulmonary vascularity  normal. No adenopathy. No bone lesions. IMPRESSION: Stable cardiac prominence. Left base atelectasis with small left pleural effusion. Lungs elsewhere clear. Electronically Signed   By: Bretta Bang III M.D.   On: 04/09/2020 11:36   DG CHEST PORT 1 VIEW  Result Date: 04/05/2020 CLINICAL DATA:  Shortness of breath. EXAM: PORTABLE CHEST 1 VIEW COMPARISON:  11/28/2017 FINDINGS: The lung volumes are low. There is opacity within the left base which may represent atelectasis, pleural effusion or airspace consolidation. Right lung appears clear. IMPRESSION: 1. Low lung volumes. 2. Left base opacity compatible with effusion, atelectasis and/or airspace consolidation. Electronically Signed   By: Signa Kell M.D.   On: 04/05/2020 14:03   DG Abd Portable 1V  Result Date: 04/08/2020 CLINICAL DATA:  Generalized pain EXAM: PORTABLE ABDOMEN - 1 VIEW COMPARISON:  CT 04/04/2020 FINDINGS: Gaseous dilatation of left lower quadrant small bowel measuring up to 3.5 cm with scattered colon gas. No radiopaque calculi. IMPRESSION: Mild gaseous dilatation of left lower quadrant small bowel with gas present in the colon, suggestive of ileus. Electronically Signed   By: Jasmine Pang M.D.   On: 04/08/2020 19:01   MR ABDOMEN MRCP W WO CONTAST  Result Date: 04/12/2020 CLINICAL DATA:  Inpatient.  Acute pancreatitis.  Alcohol abuse. EXAM: MRI ABDOMEN  WITHOUT AND WITH CONTRAST (INCLUDING MRCP) TECHNIQUE: Multiplanar multisequence MR imaging of the abdomen was performed both before and after the administration of intravenous contrast. Heavily T2-weighted images of the biliary and pancreatic ducts were obtained, and three-dimensional MRCP images were rendered by post processing. CONTRAST:  96mL GADAVIST GADOBUTROL 1 MMOL/ML IV SOLN COMPARISON:  04/11/2020 CT abdomen/pelvis. FINDINGS: Lower chest: Small dependent bilateral pleural effusions, unchanged from CT study from 1 day prior. Hepatobiliary: Top-normal liver size. No liver surface irregularity. Moderate diffuse hepatic steatosis. Subcentimeter simple segment 6 right liver cyst. No suspicious liver masses. A few tiny layering 2-3 mm gallstones in the otherwise normal gallbladder with no gallbladder wall thickening or pericholecystic fluid. No biliary ductal dilatation. Common bile duct diameter 4 mm. No choledocholithiasis. No biliary masses, strictures or beading. Pancreas: There is diffuse expansion of pancreas, most prominent in the pancreatic tail. There is abnormal mixed signal intensity throughout the expanded pancreatic tail with areas of patchy pre-contrast T1 hyperintensity. There is absence of parenchymal enhancement throughout much of the pancreatic tail. Findings are compatible with acute hemorrhagic necrotizing pancreatitis. There are similar less prominent regions of absent parenchymal enhancement and mixed signal intensity in pancreatico-duodenal groove in the pancreatic head also compatible with necrotizing pancreatitis. Overall, approximately 40% of pancreatic parenchyma appears necrotic. No discrete pancreatic mass. No pancreatic duct dilation. No pancreas divisum. A few small ill-defined peripancreatic inflammatory fluid collections are identified, largest 4.2 x 2.9 x 3.0 cm at the inferior margin of the pancreatic tail (series 3/image 30). Spleen: Top normal size spleen.  No splenic mass.  Adrenals/Urinary Tract: Normal adrenals. No hydronephrosis. Simple subcentimeter lower left renal cortical cyst. Otherwise normal kidneys, with no suspicious renal masses. Stomach/Bowel: Normal non-distended stomach. Visualized small and large bowel is normal caliber, with no bowel wall thickening. Vascular/Lymphatic: Normal caliber abdominal aorta. 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 Splenic vein appears occluded. Patent hepatic and renal veins. No pathologically enlarged lymph nodes in the abdomen. Other: No abdominal ascites. Musculoskeletal: No aggressive appearing focal osseous lesions. IMPRESSION: 1. Acute hemorrhagic necrotizing pancreatitis predominantly involving the pancreatic tail and pancreatico-duodenal groove (approximately 40% pancreatic parenchymal necrosis). Small ill-defined peripancreatic inflammatory fluid collections, largest 4.2  x 2.9 x 3.0 cm at the inferior margin of the pancreatic tail. No pancreatic duct dilation. 2. 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 3. Moderate diffuse hepatic steatosis. 4. Cholelithiasis. No biliary ductal dilatation. No choledocholithiasis. 5. Small dependent bilateral pleural effusions, unchanged from CT study from 1 day prior. Electronically Signed   By: Delbert Phenix M.D.   On: 04/12/2020 08:43     Subjective: Patient is feeling better, his abdominal pain has improved, no nausea or vomiting, no chest pain or dyspnea,.   Discharge Exam: Vitals:   04/14/20 2102 04/15/20 0400  BP: (!) 164/101 (!) 142/90  Pulse: (!) 103 94  Resp: 20 18  Temp: 97.8 F (36.6 C) 98.6 F (37 C)  SpO2: 96% 97%   Vitals:   04/14/20 0517 04/14/20 1320 04/14/20 2102 04/15/20 0400  BP: (!) 160/105 (!) 143/84 (!) 164/101 (!) 142/90  Pulse: 96 97 (!) 103 94  Resp: 20 18 20 18   Temp: 98.9 F (37.2 C) 98.5 F (36.9 C) 97.8 F (36.6 C) 98.6 F (37 C)  TempSrc: Oral Oral Oral Oral  SpO2: 96%  96% 97%   Weight:      Height:        General: Not in pain or dyspnea.  Neurology: Awake and alert, non focal  E ENT: no pallor, no icterus, oral mucosa moist Cardiovascular: No JVD. S1-S2 present, rhythmic, no gallops, rubs, or murmurs. No lower extremity edema. Pulmonary: positive breath sounds bilaterally, adequate air movement, no wheezing, rhonchi or rales. Gastrointestinal. Abdomen mild tender to deep palpation Skin. No rashes Musculoskeletal: no joint deformities   The results of significant diagnostics from this hospitalization (including imaging, microbiology, ancillary and laboratory) are listed below for reference.     Microbiology: No results found for this or any previous visit (from the past 240 hour(s)).   Labs: BNP (last 3 results) No results for input(s): BNP in the last 8760 hours. Basic Metabolic Panel: Recent Labs  Lab 04/10/20 0616 04/11/20 0551 04/12/20 0530 04/13/20 0545 04/14/20 0546  NA 137 135 136 135 131*  K 3.2* 3.2* 3.7 3.9 3.9  CL 98 98 102 100 96*  CO2 27 26 24 23 23   GLUCOSE 207* 199* 177* 160* 187*  BUN 9 8 7  5* 6  CREATININE 0.70 0.72  0.74 0.62 0.66 0.70  CALCIUM 8.2* 8.3* 8.3* 8.4* 8.3*  MG 1.8 2.0 1.8 1.8 1.8  PHOS 2.0* 2.6 2.9 3.1 3.0   Liver Function Tests: Recent Labs  Lab 04/10/20 0616 04/11/20 0551 04/12/20 0530 04/13/20 0545 04/14/20 0546  AST  --  22 20 20 21   ALT  --  25 21 19 19   ALKPHOS  --  77 70 68 66  BILITOT  --  1.0 1.0 0.8 1.1  PROT  --  7.0 6.6 6.9 7.0  ALBUMIN 2.6* 2.7* 2.6* 2.7* 2.7*   Recent Labs  Lab 04/08/20 1851 04/11/20 0938 04/12/20 0530 04/13/20 0545 04/14/20 0546  LIPASE 35 63* 55* 62* 68*   No results for input(s): AMMONIA in the last 168 hours. CBC: Recent Labs  Lab 04/09/20 0237 04/10/20 0616 04/11/20 0551 04/12/20 0530 04/13/20 0545 04/14/20 0546  WBC 15.8* 17.7* 19.6* 18.1* 16.2* 15.2*  NEUTROABS 11.8*  --  16.2* 15.0*  --   --   HGB 11.7* 11.5* 11.5* 11.4* 11.7* 11.5*  HCT  35.9* 34.2* 34.3* 34.2* 36.4* 34.7*  MCV 98.9 96.6 96.3 96.3 98.1 96.7  PLT 167 201 282  308 366 373   Cardiac Enzymes: No results for input(s): CKTOTAL, CKMB, CKMBINDEX, TROPONINI in the last 168 hours. BNP: Invalid input(s): POCBNP CBG: Recent Labs  Lab 04/14/20 1630 04/14/20 1936 04/14/20 2351 04/15/20 0403 04/15/20 0743  GLUCAP 146* 133* 137* 131* 151*   D-Dimer No results for input(s): DDIMER in the last 72 hours. Hgb A1c No results for input(s): HGBA1C in the last 72 hours. Lipid Profile No results for input(s): CHOL, HDL, LDLCALC, TRIG, CHOLHDL, LDLDIRECT in the last 72 hours. Thyroid function studies No results for input(s): TSH, T4TOTAL, T3FREE, THYROIDAB in the last 72 hours.  Invalid input(s): FREET3 Anemia work up No results for input(s): VITAMINB12, FOLATE, FERRITIN, TIBC, IRON, RETICCTPCT in the last 72 hours. Urinalysis    Component Value Date/Time   COLORURINE AMBER (A) 04/04/2020 2354   APPEARANCEUR CLEAR 04/04/2020 2354   LABSPEC 1.025 04/04/2020 2354   PHURINE 5.0 04/04/2020 2354   GLUCOSEU 50 (A) 04/04/2020 2354   HGBUR SMALL (A) 04/04/2020 2354   BILIRUBINUR NEGATIVE 04/04/2020 2354   BILIRUBINUR Negative 01/28/2019 1521   KETONESUR 5 (A) 04/04/2020 2354   PROTEINUR 100 (A) 04/04/2020 2354   UROBILINOGEN 0.2 01/28/2019 1521   UROBILINOGEN 0.2 11/13/2016 1234   NITRITE NEGATIVE 04/04/2020 2354   LEUKOCYTESUR NEGATIVE 04/04/2020 2354   Sepsis Labs Invalid input(s): PROCALCITONIN,  WBC,  LACTICIDVEN Microbiology No results found for this or any previous visit (from the past 240 hour(s)).   Time coordinating discharge: 45 minutes  SIGNED:   Coralie Keens, MD  [facility] 04/15/2020, 9:38 AM

## 2020-04-15 NOTE — Discharge Instructions (Signed)
Acute Pancreatitis  Acute pancreatitis happens when the pancreas gets swollen. The pancreas is a large gland in the body that helps to control blood sugar. It also makes enzymes that help to digest food. This condition can last a few days and cause serious problems. The lungs, heart, and kidneys may stop working. What are the causes? Causes include:  Alcohol abuse.  Drug abuse.  Gallstones.  A tumor in the pancreas. Other causes include:  Some medicines.  Some chemicals.  Diabetes.  An infection.  Damage caused by an accident.  The poison (venom) from a scorpion bite.  Belly (abdominal) surgery.  The body's defense system (immune system) attacking the pancreas (autoimmune pancreatitis).  Genes that are passed from parent to child (inherited). In some cases, the cause is not known. What are the signs or symptoms?  Pain in the upper belly that may be felt in the back. The pain may be very bad.  Swelling of the belly.  Feeling sick to your stomach (nauseous) and throwing up (vomiting).  Fever. How is this treated? You will likely have to stay in the hospital. Treatment may include:  Pain medicine.  Fluid through an IV tube.  Placing a tube in the stomach to take out the stomach contents. This may help you stop throwing up.  Not eating for 3-4 days.  Antibiotic medicines, if you have an infection.  Treating any other problems that may be the cause.  Steroid medicines, if your problem is caused by your defense system attacking your body's own tissues.  Surgery. Follow these instructions at home: Eating and drinking  Follow instructions from your doctor about what to eat and drink.  Eat foods that do not have a lot of fat in them.  Eat small meals often. Do not eat big meals.  Drink enough fluid to keep your pee (urine) pale yellow.  Do not drink alcohol if it caused your condition.   Medicines  Take over-the-counter and prescription medicines only  as told by your doctor.  Ask your doctor if the medicine prescribed to you: ? Requires you to avoid driving or using heavy machinery. ? Can cause trouble pooping (constipation). You may need to take steps to prevent or treat trouble pooping:  Take over-the-counter or prescription medicines.  Eat foods that are high in fiber. These include beans, whole grains, and fresh fruits and vegetables.  Limit foods that are high in fat and sugar. These include fried or sweet foods. General instructions  Do not use any products that contain nicotine or tobacco, such as cigarettes, e-cigarettes, and chewing tobacco. If you need help quitting, ask your doctor.  Get plenty of rest.  Check your blood sugar at home as told by your doctor.  Keep all follow-up visits as told by your doctor. This is important. Contact a doctor if:  You do not get better as quickly as expected.  You have new symptoms.  Your symptoms get worse.  You have pain or weakness that lasts a long time.  You keep feeling sick to your stomach.  You get better and then you have pain again.  You have a fever. Get help right away if:  You cannot eat or keep fluids down.  Your pain gets very bad.  Your skin or the white part of your eyes turns yellow.  You have sudden swelling in your belly.  You throw up.  You feel dizzy or you pass out (faint).  Your blood sugar is high (over   300 mg/dL). Summary  Acute pancreatitis happens when the pancreas gets swollen.  This condition is often caused by alcohol abuse, drug abuse, or gallstones.  You will likely have to stay in the hospital for treatment. This information is not intended to replace advice given to you by your health care provider. Make sure you discuss any questions you have with your health care provider. Document Revised: 11/26/2017 Document Reviewed: 11/26/2017 Elsevier Patient Education  2021 Elsevier Inc.   Alcohol Abuse and Dependence Information,  Adult Alcohol is a widely available drug. People drink alcohol in different amounts. People who drink alcohol very often and in large amounts often have problems during and after drinking. They may develop what is called an alcohol use disorder. There are two main types of alcohol use disorders:  Alcohol abuse. This is when you use alcohol too much or too often. You may use alcohol to make yourself feel happy or to reduce stress. You may have a hard time setting a limit on the amount you drink.  Alcohol dependence. This is when you use alcohol consistently for a period of time, and your body changes as a result. This can make it hard to stop drinking because you may start to feel sick or feel different when you do not use alcohol. These symptoms are known as withdrawal. How can alcohol abuse and dependence affect me? Alcohol abuse and dependence can have a negative effect on your life. Drinking too much can lead to addiction. You may feel like you need alcohol to function normally. You may drink alcohol before work in the morning, during the day, or as soon as you get home from work in the evening. These actions can result in:  Poor work performance.  Job loss.  Financial problems.  Car crashes or criminal charges from driving after drinking alcohol.  Problems in your relationships with friends and family.  Losing the trust and respect of coworkers, friends, and family. Drinking heavily over a long period of time can permanently damage your body and brain, and can cause lifelong health issues, such as:  Damage to your liver or pancreas.  Heart problems, high blood pressure, or stroke.  Certain cancers.  Decreased ability to fight infections.  Brain or nerve damage.  Depression.  Early (premature) death. If you are careless or you crave alcohol, it is easy to drink more than your body can handle (overdose). Alcohol overdose is a serious situation that requires hospitalization. It  may lead to permanent injuries or death. What can increase my risk?  Having a family history of alcohol abuse.  Having depression or other mental health conditions.  Beginning to drink at an early age.  Binge drinking often.  Experiencing trauma, stress, and an unstable home life during childhood.  Spending time with people who drink often. What actions can I take to prevent or manage alcohol abuse and dependence?  Do not drink alcohol if: ? Your health care provider tells you not to drink. ? You are pregnant, may be pregnant, or are planning to become pregnant.  If you drink alcohol: ? Limit how much you use to:  0-1 drink a day for women.  0-2 drinks a day for men. ? Be aware of how much alcohol is in your drink. In the [place] one drink equals one 12 oz bottle of beer (355 mL), one 5 oz glass of wine (148 mL), or one 1 oz glass of hard liquor (44 mL).  Stop drinking if you  have been drinking too much. This can be very hard to do if you are used to abusing alcohol. If you begin to have withdrawal symptoms, talk with your health care provider or a person that you trust. These symptoms may include anxiety, shaky hands, headache, nausea, sweating, or not being able to sleep.  Choose to drink nonalcoholic beverages in social gatherings and places where there may be alcohol. Activity  Spend more time on activities that you enjoy that do not involve alcohol, like hobbies or exercise.  Find healthy ways to cope with stress, such as exercise, meditation, or spending time with people you care about. General information  Talk to your family, coworkers, and friends about supporting you in your efforts to stop drinking. If they drink, ask them not to drink around you. Spend more time with people who do not drink alcohol.  If you think that you have an alcohol dependency problem: ? Tell friends or family about your concerns. ? Talk with your health care provider or another health  professional about where to get help. ? Work with a Paramedic and a Network engineer. ? Consider joining a support group for people who struggle with alcohol abuse and dependence. Where to find support  Your health care provider.  SMART Recovery: www.smartrecovery.org Therapy and support groups  Local treatment centers or chemical dependency counselors.  Local AA groups in your community: SalaryStart.tn   Where to find more information  Centers for Disease Control and Prevention: FootballExhibition.com.br  [employer] on Alcohol Abuse and Alcoholism: BasicStudents.dk  Alcoholics Anonymous (AA): SalaryStart.tn Contact a health care provider if:  You drank more or for longer than you intended on more than one occasion.  You tried to stop drinking or to cut back on how much you drink, but you were not able to.  You often drink to the point of vomiting or passing out.  You want to drink so badly that you cannot think about anything else.  You have problems in your life due to drinking, but you continue to drink.  You keep drinking even though you feel anxious, depressed, or have experienced memory loss.  You have stopped doing the things you used to enjoy in order to drink.  You have to drink more than you used to in order to get the effect you want.  You experience anxiety, sweating, nausea, shakiness, and trouble sleeping when you try to stop drinking. Get help right away if:  You have thoughts about hurting yourself or others.  You have serious withdrawal symptoms, including: ? Confusion. ? Racing heart. ? High blood pressure. ? Fever. If you ever feel like you may hurt yourself or others, or have thoughts about taking your own life, get help right away. You can go to your nearest emergency department or call:  Your local emergency services (911 in the [place]  A suicide crisis helpline, such as the National Suicide Prevention Lifeline at 4145480098. This is open  24 hours a day. Summary  Alcohol abuse and dependence can have a negative effect on your life. Drinking too much or too often can lead to addiction.  If you drink alcohol, limit how much you use.  If you are having trouble keeping your drinking under control, find ways to change your behavior. Hobbies, calming activities, exercise, or support groups can help.  If you feel you need help with changing your drinking habits, talk with your health care provider, a good friend, or a therapist, or go  to an AA group. This information is not intended to replace advice given to you by your health care provider. Make sure you discuss any questions you have with your health care provider. Document Revised: 05/28/2018 Document Reviewed: 04/16/2018 Elsevier Patient Education  2021 Elsevier Inc.   Abdominal Pain, Adult Many things can cause belly (abdominal) pain. Most times, belly pain is not dangerous. Many cases of belly pain can be watched and treated at home. Sometimes, though, belly pain is serious. Your doctor will try to find the cause of your belly pain. Follow these instructions at home: Medicines  Take over-the-counter and prescription medicines only as told by your doctor.  Do not take medicines that help you poop (laxatives) unless told by your doctor. General instructions  Watch your belly pain for any changes.  Drink enough fluid to keep your pee (urine) pale yellow.  Keep all follow-up visits as told by your doctor. This is important.   Contact a doctor if:  Your belly pain changes or gets worse.  You are not hungry, or you lose weight without trying.  You are having trouble pooping (constipated) or have watery poop (diarrhea) for more than 2-3 days.  You have pain when you pee or poop.  Your belly pain wakes you up at night.  Your pain gets worse with meals, after eating, or with certain foods.  You are vomiting and cannot keep anything down.  You have a fever.  You  have blood in your pee. Get help right away if:  Your pain does not go away as soon as your doctor says it should.  You cannot stop vomiting.  Your pain is only in areas of your belly, such as the right side or the left lower part of the belly.  You have bloody or black poop, or poop that looks like tar.  You have very bad pain, cramping, or bloating in your belly.  You have signs of not having enough fluid or water in your body (dehydration), such as: ? Dark pee, very little pee, or no pee. ? Cracked lips. ? Dry mouth. ? Sunken eyes. ? Sleepiness. ? Weakness.  You have trouble breathing or chest pain. Summary  Many cases of belly pain can be watched and treated at home.  Watch your belly pain for any changes.  Take over-the-counter and prescription medicines only as told by your doctor.  Contact a doctor if your belly pain changes or gets worse.  Get help right away if you have very bad pain, cramping, or bloating in your belly. This information is not intended to replace advice given to you by your health care provider. Make sure you discuss any questions you have with your health care provider. Document Revised: 06/17/2018 Document Reviewed: 06/17/2018 Elsevier Patient Education  2021 [employer].

## 2020-04-15 NOTE — Progress Notes (Signed)
[facility] Gastroenterology Progress Note  Peter [name] 40 y.o. January 24, 1981  CC:  Acute hemorrhagic necrotizing pancreatitis  Subjective: Patient reports feeling about the same today as he did yesterday, though abdominal pain is slightly better.  Continues to loose stools but states they are slightly more formed.  He reports 4 stools yesterday. Denies nausea/vomiting and is tolerating a soft diet.    ROS : Review of Systems  Cardiovascular: Negative for chest pain and palpitations.  Gastrointestinal: Positive for abdominal pain and diarrhea. Negative for blood in stool, constipation, heartburn, melena, nausea and vomiting.    Objective: Vital signs in last 24 hours: Vitals:   04/14/20 2102 04/15/20 0400  BP: (!) 164/101 (!) 142/90  Pulse: (!) 103 94  Resp: 20 18  Temp: 97.8 F (36.6 C) 98.6 F (37 C)  SpO2: 96% 97%    Physical Exam:  General:  Alert, oriented, ooperative, no distress  Head:  Normocephalic, without obvious abnormality, atraumatic  Eyes:  Anicteric sclera, EOMs intact  Lungs:   Clear to auscultation bilaterally, respirations unlabored  Heart:  Regular rate and rhythm, S1, S2 normal  Abdomen:   Soft with mild firmness in the epigastrium, mild epigastric tenderness on palpation.  No guarding or peritoneal signs. Normoactive bowel sounds  Extremities: Extremities normal, atraumatic, no  edema  Pulses: 2+ and symmetric    Lab Results: Recent Labs    04/13/20 0545 04/14/20 0546  NA 135 131*  K 3.9 3.9  CL 100 96*  CO2 23 23  GLUCOSE 160* 187*  BUN 5* 6  CREATININE 0.66 0.70  CALCIUM 8.4* 8.3*  MG 1.8 1.8  PHOS 3.1 3.0   Recent Labs    04/13/20 0545 04/14/20 0546  AST 20 21  ALT 19 19  ALKPHOS 68 66  BILITOT 0.8 1.1  PROT 6.9 7.0  ALBUMIN 2.7* 2.7*   Recent Labs    04/13/20 0545 04/14/20 0546  WBC 16.2* 15.2*  HGB 11.7* 11.5*  HCT 36.4* 34.7*  MCV 98.1 96.7  PLT 366 373   No results for input(s): LABPROT, INR in the last 72  hours.    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 -MRI/MRCP 04/12/20 revealed acute hemorrhagic necrotizing pancreatitis involving 40% of the pancreatic parencyma.  Small ill-defined peripancreatic inflammatory fluid collections, largest 4.2 x 2.9 x 3.0 cm -Hgb 11.5 as of 2/23, stable -WBCs 15.2 as of 2/23, gradually decreasing -Normal kidney function as of 2/23: BUN 6/creatinine 0.70 -Lipase 68 as of 2/23 (1076 on admission) -LFTs have remained normal  Plan: OK from a GI standpoint to discharge today.  Continue low-fat diet and Creon 36,000u with meals. Importance of strict alcohol abstinence was discussed with the patient.  Patient to follow up with PCP [facility]).  Recommend repeat CT in 2-3 weeks (or sooner if symptoms worsen).  Follow up with GI as recommended by PCP, or [facility], whichever patient/PCP prefer.  [facility] will sign off. Please contact us if we can be of any further assistance during this hospital stay.  Edrick Kins PA-C 04/15/2020, 10:32 AM  Contact #  [phone]

## 2020-05-27 ENCOUNTER — Other Ambulatory Visit: Payer: Self-pay

## 2020-05-27 ENCOUNTER — Ambulatory Visit (INDEPENDENT_AMBULATORY_CARE_PROVIDER_SITE_OTHER): Payer: Self-pay | Admitting: Nurse Practitioner

## 2020-05-27 ENCOUNTER — Encounter: Payer: Self-pay | Admitting: Nurse Practitioner

## 2020-05-27 VITALS — BP 152/99 | HR 106 | Temp 98.2°F | Ht 70.0 in | Wt 212.0 lb

## 2020-05-27 DIAGNOSIS — Z131 Encounter for screening for diabetes mellitus: Secondary | ICD-10-CM

## 2020-05-27 DIAGNOSIS — Z13220 Encounter for screening for lipoid disorders: Secondary | ICD-10-CM

## 2020-05-27 DIAGNOSIS — F411 Generalized anxiety disorder: Secondary | ICD-10-CM

## 2020-05-27 DIAGNOSIS — I10 Essential (primary) hypertension: Secondary | ICD-10-CM

## 2020-05-27 MED ORDER — LOSARTAN POTASSIUM 25 MG PO TABS: 25.0000 mg | ORAL_TABLET | Freq: Every day | ORAL | 3 refills | Status: DC

## 2020-05-27 MED ORDER — BUSPIRONE HCL 7.5 MG PO TABS
7.5000 mg | ORAL_TABLET | Freq: Three times a day (TID) | ORAL | 3 refills | Status: DC
Start: 2020-05-27 — End: 2020-10-31

## 2020-05-27 MED ORDER — METOPROLOL TARTRATE 25 MG PO TABS: 25.0000 mg | ORAL_TABLET | Freq: Two times a day (BID) | ORAL | 3 refills | Status: DC

## 2020-05-27 MED ORDER — AMLODIPINE BESYLATE 10 MG PO TABS: 10.0000 mg | ORAL_TABLET | Freq: Every day | ORAL | 3 refills | Status: DC

## 2020-05-27 MED ORDER — VENLAFAXINE HCL ER 75 MG PO CP24: 75.0000 mg | ORAL_CAPSULE | Freq: Every day | ORAL | 3 refills | Status: DC

## 2020-05-27 MED ORDER — CLONIDINE HCL 0.2 MG PO TABS: 0.2000 mg | ORAL_TABLET | Freq: Three times a day (TID) | ORAL | 3 refills | Status: DC

## 2020-05-27 NOTE — Progress Notes (Signed)
[facility] [address] [place], [place]  [address] Phone:  [phone]   Fax:  [phone]   Established Patient Office Visit  Subjective:  Patient ID: Peter [name], male    DOB: [birth year]  Age: 40 y.o. MRN: [id number]  CC:  Chief Complaint  Patient presents with  . Medication Refill    Discuss getting refill on medication     HPI Peter [name] presents for follow up. He  has a past medical history of Alcoholism (HCC), DKA (diabetic ketoacidosis) (HCC) (04/04/2020), Hypertension, and Substance abuse (HCC).   He has a history of hypertension is currently being treated amlodipine 10 mg, clonidine 0.2 mg 3 times daily, losartan 25 mg daily and metoprolol 25 mg twice daily.  He admits that he has been without his medication for a few days. Denies headache, dizziness, visual changes, shortness of breath, dyspnea on exertion, chest pain, nausea, vomiting or any edema.   His history indicates diabetic ketoacidosis.  On 04/04/2020 hemoglobin A1c was 6.6% during his ER visit for acute pancreatitis.  He was to follow-up with his PCP in March for repeat CT abdomen.  He was also supposed to follow-up with GI. He denies any additional symptoms. Past Medical History:  Diagnosis Date  . Alcoholism (HCC)   . DKA (diabetic ketoacidosis) (HCC) 04/04/2020  . Hypertension   . Substance abuse [facility])     Past Surgical History:  Procedure Laterality Date  . DENTAL SURGERY    . TIBIA IM NAIL INSERTION Right 01/22/2018   Procedure: RIGHT INTRAMEDULLARY (IM) NAIL TIBIAL;  Surgeon: Sheral Apley, MD;  Location: [facility] OR;  Service: Orthopedics;  Laterality: Right;    Family History  Problem Relation Age of Onset  . Hypertension Father   . Stroke Father   . CAD Father   . Cancer Paternal Grandfather        Lung  . Hyperlipidemia Paternal Grandfather   . Hypertension Paternal Grandfather     Social History   Socioeconomic History  . Marital status: Single     Spouse name: Not on file  . Number of children: Not on file  . Years of education: Not on file  . Highest education level: Not on file  Occupational History  . Not on file  Tobacco Use  . Smoking status: Current Every Day Smoker    Packs/day: 0.50    Types: Cigarettes  . Smokeless tobacco: Never Used  Vaping Use  . Vaping Use: Never used  Substance and Sexual Activity  . Alcohol use: Not Currently    Comment:    . Drug use: No  . Sexual activity: Yes    Birth control/protection: Condom  Other Topics Concern  . Not on file  Social History Narrative  . Not on file   Social Determinants of Health   Financial Resource Strain: Not on file  Food Insecurity: Not on file  Transportation Needs: Not on file  Physical Activity: Not on file  Stress: Not on file  Social Connections: Not on file  Intimate Partner Violence: Not on file    Outpatient Medications Prior to Visit  Medication Sig Dispense Refill  . acetaminophen (TYLENOL) 325 MG tablet Take 2 tablets (650 mg total) by mouth every 6 (six) hours as needed for moderate pain. 90 tablet 0  . amLODipine (NORVASC) 10 MG tablet Take 1 tablet (10 mg total) by mouth daily. 30 tablet 0  . busPIRone (BUSPAR) 7.5 MG tablet Take 1 tablet (  7.5 mg total) by mouth 3 (three) times daily. 90 tablet 0  . cloNIDine (CATAPRES) 0.2 MG tablet Take 1 tablet (0.2 mg total) by mouth 3 (three) times daily. 90 tablet 0  . losartan (COZAAR) 25 MG tablet Take 1 tablet (25 mg total) by mouth daily. 30 tablet 0  . metoprolol tartrate (LOPRESSOR) 25 MG tablet Take 1 tablet (25 mg total) by mouth 2 (two) times daily. Patient to keep follow up appointment to receive additional refills. 60 tablet 0  . oxyCODONE (OXY IR/ROXICODONE) 5 MG immediate release tablet Take 1 tablet (5 mg total) by mouth every 6 (six) hours as needed for severe pain. 10 tablet 0  . venlafaxine XR (EFFEXOR-XR) 75 MG 24 hr capsule Take 1 capsule (75 mg total) by mouth daily with breakfast.  30 capsule 0  . pantoprazole (PROTONIX) 40 MG tablet Take 1 tablet (40 mg total) by mouth daily. 30 tablet 0  . venlafaxine XR (EFFEXOR-XR) 75 MG 24 hr capsule Take 1 capsule (75 mg total) by mouth daily with breakfast. 30 capsule 0   No facility-administered medications prior to visit.    No Known Allergies  ROS Review of Systems    Objective:    Physical Exam Constitutional:      General: He is not in acute distress.    Appearance: He is not ill-appearing, toxic-appearing or diaphoretic.  HENT:     Head: Normocephalic and atraumatic.  Cardiovascular:     Rate and Rhythm: Regular rhythm. Tachycardia present.     Pulses: Normal pulses.     Heart sounds: Normal heart sounds.  Pulmonary:     Effort: Pulmonary effort is normal.     Breath sounds: Normal breath sounds.  Abdominal:     Palpations: Abdomen is soft.  Musculoskeletal:     Cervical back: Normal range of motion.     Right lower leg: No edema.     Left lower leg: No edema.  Skin:    General: Skin is warm and dry.     Capillary Refill: Capillary refill takes less than 2 seconds.  Neurological:     General: No focal deficit present.     Mental Status: He is alert and oriented to person, place, and time.  Psychiatric:        Behavior: Behavior normal.     BP (!) 152/99 (BP Location: Right Arm, Patient Position: Sitting, Cuff Size: Large) Comment: pt has been out his meds for a couple days  Pulse (!) 106   Temp 98.2 F (36.8 C)   Ht 5\' 10"  (1.778 m)   Wt 212 lb (96.2 kg)   SpO2 96%   BMI 30.42 kg/m  Wt Readings from Last 3 Encounters:  05/27/20 212 lb (96.2 kg)  04/04/20 228 lb (103.4 kg)  01/28/19 228 lb 6.4 oz (103.6 kg)     Health Maintenance Due  Topic Date Due  . OPHTHALMOLOGY EXAM  Never done    There are no preventive care reminders to display for this patient.  Lab Results  Component Value Date   TSH 3.460 04/11/2020   Lab Results  Component Value Date   WBC 15.2 (H) 04/14/2020    HGB 11.5 (L) 04/14/2020   HCT 34.7 (L) 04/14/2020   MCV 96.7 04/14/2020   PLT 373 04/14/2020   Lab Results  Component Value Date   NA 131 (L) 04/14/2020   K 3.9 04/14/2020   CO2 23 04/14/2020   GLUCOSE 187 (H) 04/14/2020  BUN 6 04/14/2020   CREATININE 0.70 04/14/2020   BILITOT 1.1 04/14/2020   ALKPHOS 66 04/14/2020   AST 21 04/14/2020   ALT 19 04/14/2020   PROT 7.0 04/14/2020   ALBUMIN 2.7 (L) 04/14/2020   CALCIUM 8.3 (L) 04/14/2020   ANIONGAP 12 04/14/2020   Lab Results  Component Value Date   CHOL 128 04/04/2020   Lab Results  Component Value Date   HDL 24 (L) 04/04/2020   Lab Results  Component Value Date   LDLCALC 39 04/04/2020   Lab Results  Component Value Date   TRIG 327 (H) 04/04/2020   Lab Results  Component Value Date   CHOLHDL 5.3 04/04/2020   Lab Results  Component Value Date   HGBA1C 6.6 (H) 04/04/2020      Assessment & Plan:   Problem List Items Addressed This Visit      Cardiovascular and Mediastinum   Essential hypertension - Primary Persistent and poorly controlled even while in the hospital refilled all medications Encouraged on going compliance with current medication regimen Encouraged home monitoring and recording BP <130/80 Eating a heart-healthy diet with less salt Encouraged regular physical activity  Recommend Weight loss      Relevant Medications   amLODipine (NORVASC) 10 MG tablet   cloNIDine (CATAPRES) 0.2 MG tablet   losartan (COZAAR) 25 MG tablet   metoprolol tartrate (LOPRESSOR) 25 MG tablet   Other Relevant Orders   Comp. Metabolic Panel (12)     Other   Generalized anxiety disorder Stable on current treatment   Relevant Medications   venlafaxine XR (EFFEXOR-XR) 75 MG 24 hr capsule   busPIRone (BUSPAR) 7.5 MG tablet    Other Visit Diagnoses    Screening for cholesterol level       Relevant Orders   Lipid panel   Screening for diabetes mellitus (DM)     Hemoglobin A1c 6 weeks ago 6.6% we will continue  with diet and exercise and reevaluate in a few months.   Relevant Orders   Microalbumin, urine       Meds ordered this encounter  Medications  . amLODipine (NORVASC) 10 MG tablet    Sig: Take 1 tablet (10 mg total) by mouth daily.    Dispense:  90 tablet    Refill:  3    Order Specific Question:   Supervising Provider    Answer:   Quentin Angst [id number]  . cloNIDine (CATAPRES) 0.2 MG tablet    Sig: Take 1 tablet (0.2 mg total) by mouth 3 (three) times daily.    Dispense:  270 tablet    Refill:  3    Order Specific Question:   Supervising Provider    Answer:   Quentin Angst [id number]  . losartan (COZAAR) 25 MG tablet    Sig: Take 1 tablet (25 mg total) by mouth daily.    Dispense:  90 tablet    Refill:  3    Order Specific Question:   Supervising Provider    Answer:   Quentin Angst [id number]  . metoprolol tartrate (LOPRESSOR) 25 MG tablet    Sig: Take 1 tablet (25 mg total) by mouth 2 (two) times daily. Patient to keep follow up appointment to receive additional refills.    Dispense:  180 tablet    Refill:  3    Order Specific Question:   Supervising Provider    Answer:   Quentin Angst [id number]  . venlafaxine XR (EFFEXOR-XR) 75 MG 24 hr capsule  Sig: Take 1 capsule (75 mg total) by mouth daily with breakfast.    Dispense:  90 capsule    Refill:  3    Patient to keep follow up appointment on 11/11/2019 to receive additional refills    Order Specific Question:   Supervising Provider    Answer:   Quentin AngstJEGEDE, OLUGBEMIGA E [[id number]]  . busPIRone (BUSPAR) 7.5 MG tablet    Sig: Take 1 tablet (7.5 mg total) by mouth 3 (three) times daily.    Dispense:  90 tablet    Refill:  3    Patient to keep follow up appointment to receive additional refills.    Order Specific Question:   Supervising Provider    Answer:   Quentin AngstJEGEDE, OLUGBEMIGA E [[id number]]    Follow-up: Return in about 3 months (around 08/26/2020) for AND fasting labs.    Barbette Merinorystal M Seynabou Fults, NP

## 2020-05-27 NOTE — Patient Instructions (Signed)
Managing Your Hypertension Hypertension, also called high blood pressure, is when the force of the blood pressing against the walls of the arteries is too strong. Arteries are blood vessels that carry blood from your heart throughout your body. Hypertension forces the heart to work harder to pump blood and may cause the arteries to become narrow or stiff. Understanding blood pressure readings Your personal target blood pressure may vary depending on your medical conditions, your age, and other factors. A blood pressure reading includes a higher number over a lower number. Ideally, your blood pressure should be below 120/80. You should know that:  The first, or top, number is called the systolic pressure. It is a measure of the pressure in your arteries as your heart beats.  The second, or bottom number, is called the diastolic pressure. It is a measure of the pressure in your arteries as the heart relaxes. Blood pressure is classified into four stages. Based on your blood pressure reading, your health care provider may use the following stages to determine what type of treatment you need, if any. Systolic pressure and diastolic pressure are measured in a unit called mmHg. Normal  Systolic pressure: below 120.  Diastolic pressure: below 80. Elevated  Systolic pressure: 120-129.  Diastolic pressure: below 80. Hypertension stage 1  Systolic pressure: 130-139.  Diastolic pressure: 80-89. Hypertension stage 2  Systolic pressure: 140 or above.  Diastolic pressure: 90 or above. How can this condition affect me? Managing your hypertension is an important responsibility. Over time, hypertension can damage the arteries and decrease blood flow to important parts of the body, including the brain, heart, and kidneys. Having untreated or uncontrolled hypertension can lead to:  A heart attack.  A stroke.  A weakened blood vessel (aneurysm).  Heart failure.  Kidney damage.  Eye  damage.  Metabolic syndrome.  Memory and concentration problems.  Vascular dementia. What actions can I take to manage this condition? Hypertension can be managed by making lifestyle changes and possibly by taking medicines. Your health care provider will help you make a plan to bring your blood pressure within a normal range. Nutrition  Eat a diet that is high in fiber and potassium, and low in salt (sodium), added sugar, and fat. An example eating plan is called the Dietary Approaches to Stop Hypertension (DASH) diet. To eat this way: ? Eat plenty of fresh fruits and vegetables. Try to fill one-half of your plate at each meal with fruits and vegetables. ? Eat whole grains, such as whole-wheat pasta, brown rice, or whole-grain bread. Fill about one-fourth of your plate with whole grains. ? Eat low-fat dairy products. ? Avoid fatty cuts of meat, processed or cured meats, and poultry with skin. Fill about one-fourth of your plate with lean proteins such as fish, chicken without skin, beans, eggs, and tofu. ? Avoid pre-made and processed foods. These tend to be higher in sodium, added sugar, and fat.  Reduce your daily sodium intake. Most people with hypertension should eat less than 1,500 mg of sodium a day.   Lifestyle  Work with your health care provider to maintain a healthy body weight or to lose weight. Ask what an ideal weight is for you.  Get at least 30 minutes of exercise that causes your heart to beat faster (aerobic exercise) most days of the week. Activities may include walking, swimming, or biking.  Include exercise to strengthen your muscles (resistance exercise), such as weight lifting, as part of your weekly exercise routine. Try   to do these types of exercises for 30 minutes at least 3 days a week.  Do not use any products that contain nicotine or tobacco, such as cigarettes, e-cigarettes, and chewing tobacco. If you need help quitting, ask your health care  provider.  Control any long-term (chronic) conditions you have, such as high cholesterol or diabetes.  Identify your sources of stress and find ways to manage stress. This may include meditation, deep breathing, or making time for fun activities.   Alcohol use  Do not drink alcohol if: ? Your health care provider tells you not to drink. ? You are pregnant, may be pregnant, or are planning to become pregnant.  If you drink alcohol: ? Limit how much you use to:  0-1 drink a day for women.  0-2 drinks a day for men. ? Be aware of how much alcohol is in your drink. In the [place] one drink equals one 12 oz bottle of beer (355 mL), one 5 oz glass of wine (148 mL), or one 1 oz glass of hard liquor (44 mL). Medicines Your health care provider may prescribe medicine if lifestyle changes are not enough to get your blood pressure under control and if:  Your systolic blood pressure is 130 or higher.  Your diastolic blood pressure is 80 or higher. Take medicines only as told by your health care provider. Follow the directions carefully. Blood pressure medicines must be taken as told by your health care provider. The medicine does not work as well when you skip doses. Skipping doses also puts you at risk for problems. Monitoring Before you monitor your blood pressure:  Do not smoke, drink caffeinated beverages, or exercise within 30 minutes before taking a measurement.  Use the bathroom and empty your bladder (urinate).  Sit quietly for at least 5 minutes before taking measurements. Monitor your blood pressure at home as told by your health care provider. To do this:  Sit with your back straight and supported.  Place your feet flat on the floor. Do not cross your legs.  Support your arm on a flat surface, such as a table. Make sure your upper arm is at heart level.  Each time you measure, take two or three readings one minute apart and record the results. You may also need to have your  blood pressure checked regularly by your health care provider.   General information  Talk with your health care provider about your diet, exercise habits, and other lifestyle factors that may be contributing to hypertension.  Review all the medicines you take with your health care provider because there may be side effects or interactions.  Keep all visits as told by your health care provider. Your health care provider can help you create and adjust your plan for managing your high blood pressure. Where to find more information  National Heart, Lung, and Blood Institute: www.nhlbi.nih.gov  American Heart Association: www.heart.org Contact a health care provider if:  You think you are having a reaction to medicines you have taken.  You have repeated (recurrent) headaches.  You feel dizzy.  You have swelling in your ankles.  You have trouble with your vision. Get help right away if:  You develop a severe headache or confusion.  You have unusual weakness or numbness, or you feel faint.  You have severe pain in your chest or abdomen.  You vomit repeatedly.  You have trouble breathing. These symptoms may represent a serious problem that is an emergency. Do not wait   to see if the symptoms will go away. Get medical help right away. Call your local emergency services (911 in the [place] Do not drive yourself to the hospital. Summary  Hypertension is when the force of blood pumping through your arteries is too strong. If this condition is not controlled, it may put you at risk for serious complications.  Your personal target blood pressure may vary depending on your medical conditions, your age, and other factors. For most people, a normal blood pressure is less than 120/80.  Hypertension is managed by lifestyle changes, medicines, or both.  Lifestyle changes to help manage hypertension include losing weight, eating a healthy, low-sodium diet, exercising more, stopping smoking, and  limiting alcohol. This information is not intended to replace advice given to you by your health care provider. Make sure you discuss any questions you have with your health care provider. Document Revised: 03/14/2019 Document Reviewed: 01/07/2019 Elsevier Patient Education  2021 [employer]  

## 2020-05-28 MED ORDER — VENLAFAXINE HCL ER 75 MG PO CP24: 75.0000 mg | ORAL_CAPSULE | Freq: Every day | ORAL | 3 refills | Status: DC

## 2020-06-01 ENCOUNTER — Other Ambulatory Visit: Payer: Self-pay

## 2020-06-01 DIAGNOSIS — Z13220 Encounter for screening for lipoid disorders: Secondary | ICD-10-CM

## 2020-06-01 DIAGNOSIS — I10 Essential (primary) hypertension: Secondary | ICD-10-CM

## 2020-06-02 LAB — COMP. METABOLIC PANEL (12)
AST: 23 IU/L (ref 0–40)
Albumin/Globulin Ratio: 1.4 (ref 1.2–2.2)
Albumin: 4.2 g/dL (ref 4.0–5.0)
Alkaline Phosphatase: 88 IU/L (ref 44–121)
BUN/Creatinine Ratio: 9 (ref 9–20)
BUN: 8 mg/dL (ref 6–20)
Bilirubin Total: 0.3 mg/dL (ref 0.0–1.2)
Calcium: 8.8 mg/dL (ref 8.7–10.2)
Chloride: 94 mmol/L — ABNORMAL LOW (ref 96–106)
Creatinine, Ser: 0.87 mg/dL (ref 0.76–1.27)
Globulin, Total: 3 g/dL (ref 1.5–4.5)
Glucose: 282 mg/dL — ABNORMAL HIGH (ref 65–99)
Potassium: 3.3 mmol/L — ABNORMAL LOW (ref 3.5–5.2)
Sodium: 134 mmol/L (ref 134–144)
Total Protein: 7.2 g/dL (ref 6.0–8.5)
eGFR: 113 mL/min/{1.73_m2} (ref >59)

## 2020-06-02 LAB — LIPID PANEL
Chol/HDL Ratio: 7.1 ratio — ABNORMAL HIGH (ref 0.0–5.0)
Cholesterol, Total: 254 mg/dL — ABNORMAL HIGH (ref 100–199)
HDL: 36 mg/dL — ABNORMAL LOW (ref >39)
LDL Chol Calc (NIH): 133 mg/dL — ABNORMAL HIGH (ref 0–99)
Triglycerides: 467 mg/dL — ABNORMAL HIGH (ref 0–149)
VLDL Cholesterol Cal: 85 mg/dL — ABNORMAL HIGH (ref 5–40)

## 2020-06-06 ENCOUNTER — Telehealth (HOSPITAL_COMMUNITY): Payer: Self-pay | Admitting: Nurse Practitioner

## 2020-06-06 DIAGNOSIS — E782 Mixed hyperlipidemia: Secondary | ICD-10-CM

## 2020-06-06 DIAGNOSIS — R7303 Prediabetes: Secondary | ICD-10-CM

## 2020-06-06 MED ORDER — SIMVASTATIN 20 MG PO TABS
20.0000 mg | ORAL_TABLET | Freq: Every evening | ORAL | 11 refills | Status: DC
Start: 2020-06-06 — End: 2020-10-31

## 2020-06-06 MED ORDER — METFORMIN HCL 500 MG PO TABS: 500.0000 mg | ORAL_TABLET | Freq: Two times a day (BID) | ORAL | 11 refills | Status: DC

## 2020-06-06 NOTE — Telephone Encounter (Signed)
Assessment  Primary Diagnosis & Pertinent Problem List: The primary encounter diagnosis was Prediabetes. A diagnosis of Mixed hyperlipidemia was also pertinent to this visit.  Visit Diagnosis: 1. Prediabetes   2. Mixed hyperlipidemia

## 2020-07-05 DIAGNOSIS — E119 Type 2 diabetes mellitus without complications: Secondary | ICD-10-CM | POA: Insufficient documentation

## 2020-09-02 ENCOUNTER — Ambulatory Visit: Payer: Self-pay | Admitting: Nurse Practitioner

## 2020-10-29 ENCOUNTER — Other Ambulatory Visit: Payer: Self-pay

## 2020-10-29 ENCOUNTER — Inpatient Hospital Stay (HOSPITAL_COMMUNITY)
Admission: EM | Admit: 2020-10-29 | Discharge: 2020-10-31 | DRG: 897 | Disposition: A | Discharge status: Home / Self Care | Payer: Self-pay | Attending: Internal Medicine | Admitting: Internal Medicine

## 2020-10-29 ENCOUNTER — Encounter (HOSPITAL_COMMUNITY): Payer: Self-pay | Admitting: Emergency Medicine

## 2020-10-29 DIAGNOSIS — F411 Generalized anxiety disorder: Secondary | ICD-10-CM | POA: Diagnosis present

## 2020-10-29 DIAGNOSIS — E119 Type 2 diabetes mellitus without complications: Secondary | ICD-10-CM

## 2020-10-29 DIAGNOSIS — E1165 Type 2 diabetes mellitus with hyperglycemia: Secondary | ICD-10-CM

## 2020-10-29 DIAGNOSIS — Z79899 Other long term (current) drug therapy: Secondary | ICD-10-CM

## 2020-10-29 DIAGNOSIS — F10931 Alcohol use, unspecified with withdrawal delirium: Secondary | ICD-10-CM | POA: Diagnosis present

## 2020-10-29 DIAGNOSIS — Z7984 Long term (current) use of oral hypoglycemic drugs: Secondary | ICD-10-CM

## 2020-10-29 DIAGNOSIS — F10231 Alcohol dependence with withdrawal delirium: Principal | ICD-10-CM | POA: Diagnosis present

## 2020-10-29 DIAGNOSIS — E782 Mixed hyperlipidemia: Secondary | ICD-10-CM

## 2020-10-29 DIAGNOSIS — F10288 Alcohol dependence with other alcohol-induced disorder: Secondary | ICD-10-CM | POA: Diagnosis present

## 2020-10-29 DIAGNOSIS — Z83438 Family history of other disorder of lipoprotein metabolism and other lipidemia: Secondary | ICD-10-CM

## 2020-10-29 DIAGNOSIS — I160 Hypertensive urgency: Secondary | ICD-10-CM

## 2020-10-29 DIAGNOSIS — Z20822 Contact with and (suspected) exposure to covid-19: Secondary | ICD-10-CM | POA: Diagnosis present

## 2020-10-29 DIAGNOSIS — Y900 Blood alcohol level of less than 20 mg/100 ml: Secondary | ICD-10-CM | POA: Diagnosis present

## 2020-10-29 DIAGNOSIS — I10 Essential (primary) hypertension: Secondary | ICD-10-CM | POA: Diagnosis present

## 2020-10-29 DIAGNOSIS — F10939 Alcohol use, unspecified with withdrawal, unspecified: Secondary | ICD-10-CM | POA: Diagnosis present

## 2020-10-29 DIAGNOSIS — Z8249 Family history of ischemic heart disease and other diseases of the circulatory system: Secondary | ICD-10-CM

## 2020-10-29 DIAGNOSIS — R7303 Prediabetes: Secondary | ICD-10-CM

## 2020-10-29 DIAGNOSIS — F10239 Alcohol dependence with withdrawal, unspecified: Principal | ICD-10-CM

## 2020-10-29 DIAGNOSIS — F17210 Nicotine dependence, cigarettes, uncomplicated: Secondary | ICD-10-CM | POA: Diagnosis present

## 2020-10-29 DIAGNOSIS — E785 Hyperlipidemia, unspecified: Secondary | ICD-10-CM | POA: Diagnosis present

## 2020-10-29 LAB — CBC
HCT: 41 % (ref 39.0–52.0)
Hemoglobin: 14.5 g/dL (ref 13.0–17.0)
MCH: 32.1 pg (ref 26.0–34.0)
MCHC: 35.4 g/dL (ref 30.0–36.0)
MCV: 90.7 fL (ref 80.0–100.0)
Platelets: 153 10*3/uL (ref 150–400)
RBC: 4.52 MIL/uL (ref 4.22–5.81)
RDW: 14 % (ref 11.5–15.5)
WBC: 7.9 10*3/uL (ref 4.0–10.5)
nRBC: 0 % (ref 0.0–0.2)

## 2020-10-29 LAB — COMPREHENSIVE METABOLIC PANEL
ALT: 41 U/L (ref 0–44)
AST: 35 U/L (ref 15–41)
Albumin: 4.3 g/dL (ref 3.5–5.0)
Alkaline Phosphatase: 95 U/L (ref 38–126)
Anion gap: 15 (ref 5–15)
BUN: 16 mg/dL (ref 6–20)
CO2: 24 mmol/L (ref 22–32)
Calcium: 10.4 mg/dL — ABNORMAL HIGH (ref 8.9–10.3)
Chloride: 98 mmol/L (ref 98–111)
Creatinine, Ser: 0.77 mg/dL (ref 0.61–1.24)
GFR, Estimated: >60 mL/min (ref >60)
Glucose, Bld: 322 mg/dL — ABNORMAL HIGH (ref 70–99)
Potassium: 4 mmol/L (ref 3.5–5.1)
Sodium: 137 mmol/L (ref 135–145)
Total Bilirubin: 0.7 mg/dL (ref 0.3–1.2)
Total Protein: 8.5 g/dL — ABNORMAL HIGH (ref 6.5–8.1)

## 2020-10-29 LAB — URINALYSIS, MICROSCOPIC (REFLEX)
RBC / HPF: NONE SEEN RBC/hpf (ref 0–5)
Squamous Epithelial / HPF: NONE SEEN (ref 0–5)
WBC, UA: NONE SEEN WBC/hpf (ref 0–5)

## 2020-10-29 LAB — URINALYSIS, ROUTINE W REFLEX MICROSCOPIC
Bilirubin Urine: NEGATIVE
Glucose, UA: >=500 mg/dL — AB
Hgb urine dipstick: NEGATIVE
Ketones, ur: 15 mg/dL — AB
Leukocytes,Ua: NEGATIVE
Nitrite: NEGATIVE
Protein, ur: 30 mg/dL — AB
Specific Gravity, Urine: 1.02 (ref 1.005–1.030)
pH: 6.5 (ref 5.0–8.0)

## 2020-10-29 LAB — BLOOD GAS, VENOUS
Acid-Base Excess: 2.2 mmol/L — ABNORMAL HIGH (ref 0.0–2.0)
Bicarbonate: 25.7 mmol/L (ref 20.0–28.0)
O2 Saturation: 94.2 %
Patient temperature: 98.6
pCO2, Ven: 38.1 mmHg — ABNORMAL LOW (ref 44.0–60.0)
pH, Ven: 7.444 — ABNORMAL HIGH (ref 7.250–7.430)
pO2, Ven: 71.1 mmHg — ABNORMAL HIGH (ref 32.0–45.0)

## 2020-10-29 LAB — RAPID URINE DRUG SCREEN, HOSP PERFORMED
Amphetamines: NOT DETECTED
Barbiturates: NOT DETECTED
Benzodiazepines: POSITIVE — AB
Cocaine: NOT DETECTED
Opiates: NOT DETECTED
Tetrahydrocannabinol: NOT DETECTED

## 2020-10-29 LAB — CBC WITH DIFFERENTIAL/PLATELET
Abs Immature Granulocytes: 0.05 10*3/uL (ref 0.00–0.07)
Basophils Absolute: 0.1 10*3/uL (ref 0.0–0.1)
Basophils Relative: 1 %
Eosinophils Absolute: 0 10*3/uL (ref 0.0–0.5)
Eosinophils Relative: 0 %
HCT: 50.9 % (ref 39.0–52.0)
Hemoglobin: 17.9 g/dL — ABNORMAL HIGH (ref 13.0–17.0)
Immature Granulocytes: 1 %
Lymphocytes Relative: 23 %
Lymphs Abs: 2.3 10*3/uL (ref 0.7–4.0)
MCH: 31.5 pg (ref 26.0–34.0)
MCHC: 35.2 g/dL (ref 30.0–36.0)
MCV: 89.6 fL (ref 80.0–100.0)
Monocytes Absolute: 1.1 10*3/uL — ABNORMAL HIGH (ref 0.1–1.0)
Monocytes Relative: 12 %
Neutro Abs: 6.2 10*3/uL (ref 1.7–7.7)
Neutrophils Relative %: 63 %
Platelets: 233 10*3/uL (ref 150–400)
RBC: 5.68 MIL/uL (ref 4.22–5.81)
RDW: 13.7 % (ref 11.5–15.5)
WBC: 9.7 10*3/uL (ref 4.0–10.5)
nRBC: 0 % (ref 0.0–0.2)

## 2020-10-29 LAB — LIPASE, BLOOD: Lipase: 33 U/L (ref 11–51)

## 2020-10-29 LAB — TSH: TSH: 1.212 u[IU]/mL (ref 0.350–4.500)

## 2020-10-29 LAB — ETHANOL: Alcohol, Ethyl (B): <10 mg/dL (ref <10)

## 2020-10-29 LAB — TROPONIN I (HIGH SENSITIVITY): Troponin I (High Sensitivity): 3 ng/L (ref <18)

## 2020-10-29 LAB — MAGNESIUM: Magnesium: 1.7 mg/dL (ref 1.7–2.4)

## 2020-10-29 LAB — CBG MONITORING, ED
Glucose-Capillary: 241 mg/dL — ABNORMAL HIGH (ref 70–99)
Glucose-Capillary: 333 mg/dL — ABNORMAL HIGH (ref 70–99)

## 2020-10-29 MED ORDER — LORAZEPAM 2 MG/ML IJ SOLN
2.0000 mg | Freq: Once | INTRAMUSCULAR | Status: AC
  Administered 2020-10-29: 2 mg via INTRAVENOUS
  Filled 2020-10-29: qty 1

## 2020-10-29 MED ORDER — THIAMINE HCL 100 MG PO TABS
100.0000 mg | ORAL_TABLET | Freq: Every day | ORAL | Status: DC
  Administered 2020-10-29 – 2020-10-31 (×3): 100 mg via ORAL
  Filled 2020-10-29 (×3): qty 1

## 2020-10-29 MED ORDER — ONDANSETRON HCL 4 MG PO TABS: 4.0000 mg | ORAL_TABLET | Freq: Four times a day (QID) | ORAL | Status: DC | PRN

## 2020-10-29 MED ORDER — LORAZEPAM 1 MG PO TABS
0.0000 mg | ORAL_TABLET | Freq: Four times a day (QID) | ORAL | Status: DC
  Administered 2020-10-30: 1 mg via ORAL
  Filled 2020-10-29: qty 1

## 2020-10-29 MED ORDER — CLONIDINE HCL 0.1 MG PO TABS
0.2000 mg | ORAL_TABLET | Freq: Three times a day (TID) | ORAL | Status: DC
  Administered 2020-10-29 – 2020-10-31 (×5): 0.2 mg via ORAL
  Filled 2020-10-29 (×5): qty 2

## 2020-10-29 MED ORDER — CLONIDINE HCL 0.1 MG PO TABS
0.2000 mg | ORAL_TABLET | Freq: Once | ORAL | Status: AC
  Administered 2020-10-29: 0.2 mg via ORAL
  Filled 2020-10-29: qty 2

## 2020-10-29 MED ORDER — MORPHINE SULFATE (PF) 2 MG/ML IV SOLN
2.0000 mg | INTRAVENOUS | Status: DC | PRN
Start: 2020-10-29 — End: 2020-10-31

## 2020-10-29 MED ORDER — BUSPIRONE HCL 5 MG PO TABS
7.5000 mg | ORAL_TABLET | Freq: Two times a day (BID) | ORAL | Status: DC
  Administered 2020-10-29 – 2020-10-31 (×4): 7.5 mg via ORAL
  Filled 2020-10-29: qty 2
  Filled 2020-10-29 (×2): qty 1.5
  Filled 2020-10-29 (×2): qty 2

## 2020-10-29 MED ORDER — SIMVASTATIN 10 MG PO TABS
20.0000 mg | ORAL_TABLET | Freq: Every evening | ORAL | Status: DC
  Administered 2020-10-30: 20 mg via ORAL
  Filled 2020-10-29: qty 2

## 2020-10-29 MED ORDER — LORAZEPAM 2 MG/ML IJ SOLN: 0.0000 mg | Freq: Two times a day (BID) | INTRAMUSCULAR | Status: DC

## 2020-10-29 MED ORDER — LORAZEPAM 1 MG PO TABS: 0.0000 mg | ORAL_TABLET | Freq: Two times a day (BID) | ORAL | Status: DC

## 2020-10-29 MED ORDER — ONDANSETRON HCL 4 MG/2ML IJ SOLN: 4.0000 mg | Freq: Four times a day (QID) | INTRAMUSCULAR | Status: DC | PRN

## 2020-10-29 MED ORDER — THIAMINE HCL 100 MG/ML IJ SOLN: 100.0000 mg | Freq: Every day | INTRAMUSCULAR | Status: DC

## 2020-10-29 MED ORDER — BUSPIRONE HCL 5 MG PO TABS
7.5000 mg | ORAL_TABLET | Freq: Every day | ORAL | Status: DC | PRN
  Filled 2020-10-29: qty 1.5

## 2020-10-29 MED ORDER — METOPROLOL TARTRATE 25 MG PO TABS
25.0000 mg | ORAL_TABLET | Freq: Two times a day (BID) | ORAL | Status: DC
  Administered 2020-10-29 – 2020-10-31 (×4): 25 mg via ORAL
  Filled 2020-10-29 (×4): qty 1

## 2020-10-29 MED ORDER — INSULIN ASPART 100 UNIT/ML IJ SOLN
0.0000 [IU] | Freq: Every day | INTRAMUSCULAR | Status: DC
  Administered 2020-10-29: 2 [IU] via SUBCUTANEOUS
  Filled 2020-10-29: qty 0.05

## 2020-10-29 MED ORDER — VENLAFAXINE HCL ER 75 MG PO CP24
75.0000 mg | ORAL_CAPSULE | Freq: Every day | ORAL | Status: DC
  Administered 2020-10-30 – 2020-10-31 (×2): 75 mg via ORAL
  Filled 2020-10-29 (×2): qty 1

## 2020-10-29 MED ORDER — INSULIN ASPART 100 UNIT/ML IJ SOLN
0.0000 [IU] | Freq: Three times a day (TID) | INTRAMUSCULAR | Status: DC
  Administered 2020-10-30: 2 [IU] via SUBCUTANEOUS
  Administered 2020-10-30: 3 [IU] via SUBCUTANEOUS
  Administered 2020-10-30: 7 [IU] via SUBCUTANEOUS
  Administered 2020-10-31: 1 [IU] via SUBCUTANEOUS
  Administered 2020-10-31: 3 [IU] via SUBCUTANEOUS
  Filled 2020-10-29: qty 0.09

## 2020-10-29 MED ORDER — LACTATED RINGERS IV BOLUS
1000.0000 mL | Freq: Once | INTRAVENOUS | Status: AC
  Administered 2020-10-29: 1000 mL via INTRAVENOUS

## 2020-10-29 MED ORDER — METOPROLOL TARTRATE 25 MG PO TABS
25.0000 mg | ORAL_TABLET | Freq: Once | ORAL | Status: AC
  Administered 2020-10-29: 25 mg via ORAL
  Filled 2020-10-29: qty 1

## 2020-10-29 MED ORDER — METOPROLOL TARTRATE 5 MG/5ML IV SOLN: 5.0000 mg | Freq: Four times a day (QID) | INTRAVENOUS | Status: DC | PRN

## 2020-10-29 MED ORDER — ENOXAPARIN SODIUM 40 MG/0.4ML IJ SOSY
40.0000 mg | PREFILLED_SYRINGE | INTRAMUSCULAR | Status: DC
  Administered 2020-10-30 – 2020-10-31 (×2): 40 mg via SUBCUTANEOUS
  Filled 2020-10-29 (×2): qty 0.4

## 2020-10-29 MED ORDER — LOSARTAN POTASSIUM 50 MG PO TABS
25.0000 mg | ORAL_TABLET | Freq: Every day | ORAL | Status: DC
  Administered 2020-10-30 – 2020-10-31 (×2): 25 mg via ORAL
  Filled 2020-10-29 (×2): qty 1

## 2020-10-29 MED ORDER — AMLODIPINE BESYLATE 10 MG PO TABS
10.0000 mg | ORAL_TABLET | Freq: Every day | ORAL | Status: DC
  Administered 2020-10-30 – 2020-10-31 (×2): 10 mg via ORAL
  Filled 2020-10-29 (×2): qty 1

## 2020-10-29 MED ORDER — LACTATED RINGERS IV BOLUS
500.0000 mL | Freq: Once | INTRAVENOUS | Status: AC
  Administered 2020-10-29: 500 mL via INTRAVENOUS

## 2020-10-29 MED ORDER — LORAZEPAM 2 MG/ML IJ SOLN
0.0000 mg | Freq: Four times a day (QID) | INTRAMUSCULAR | Status: DC
  Administered 2020-10-29: 2 mg via INTRAVENOUS
  Administered 2020-10-29 – 2020-10-30 (×2): 1 mg via INTRAVENOUS
  Filled 2020-10-29 (×3): qty 1

## 2020-10-29 MED ORDER — LACTATED RINGERS IV SOLN: INTRAVENOUS | Status: DC

## 2020-10-29 NOTE — ED Provider Notes (Signed)
[facility]-EMERGENCY DEPT Provider Note   CSN: [id number] Arrival date & time: 10/29/20  1556     History Chief Complaint  Patient presents with   Abdominal Pain   Alcohol Problem    Peter [name] is a 40 y.o. male.  HPI Patient is a 40 year old male with a history of DKA, EtOH abuse, substance abuse, who presents to the emergency department due to abdominal pain.  Patient states that he has a history of pancreatitis and is a chronic alcoholic.  States that he initially quit drinking for short period of time after being diagnosed with pancreatitis but started drinking once again about 2 months ago.  He states that he drinks about 1/5 of liquor per day.  He states that he has been having waxing and waning upper abdominal pain for the past week and in addition to this also notes that he is having more difficulty with his work due to his alcohol abuse so he stopped drinking around 2 AM this morning.  Reports nausea with 2 episodes of vomiting this morning.  No diarrhea.  Reports a history of DTs in the past.  Denies any hallucinations at this time.    Past Medical History:  Diagnosis Date   Alcoholism (HCC)    DKA (diabetic ketoacidosis) (HCC) 04/04/2020   Hypertension    Substance abuse [facility])     Patient Active Problem List   Diagnosis Date Noted   Lactic acidosis 04/04/2020   DKA (diabetic ketoacidosis) (HCC) 04/04/2020   Pancreatitis, acute 04/04/2020   High anion gap metabolic acidosis    Generalized anxiety disorder 01/29/2019   Depression 01/29/2019   Right tibial fracture 01/21/2018   Chest pain 05/20/2017   Hypomagnesemia 02/15/2017   Abdominal pain 02/14/2017   Acute pancreatitis 02/14/2017   Alcoholic hepatitis without ascites 02/14/2017   Hypokalemia 12/03/2016   Leukocytosis 12/03/2016   Alcohol withdrawal (HCC) 12/02/2016   Alcohol dependence with other alcohol-induced disorder (HCC) 11/18/2016   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   Essential  hypertension 11/18/2016   Elevated liver enzymes 11/14/2016    Past Surgical History:  Procedure Laterality Date   DENTAL SURGERY     TIBIA IM NAIL INSERTION Right 01/22/2018   Procedure: RIGHT INTRAMEDULLARY (IM) NAIL TIBIAL;  Surgeon: Sheral Apley, MD;  Location: [facility] OR;  Service: Orthopedics;  Laterality: Right;       Family History  Problem Relation Age of Onset   Hypertension Father    Stroke Father    CAD Father    Cancer Paternal Grandfather        Lung   Hyperlipidemia Paternal Grandfather    Hypertension Paternal Grandfather     Social History   Tobacco Use   Smoking status: Every Day    Packs/day: 0.50    Types: Cigarettes   Smokeless tobacco: Never  Vaping Use   Vaping Use: Never used  Substance Use Topics   Alcohol use: Not Currently    Comment:     Drug use: No    Home Medications Prior to Admission medications   Medication Sig Start Date End Date Taking? Authorizing Provider  amLODipine (NORVASC) 10 MG tablet Take 1 tablet (10 mg total) by mouth daily. 05/27/20 05/27/21 Yes King, Shana Chute, NP  busPIRone (BUSPAR) 7.5 MG tablet Take 1 tablet (7.5 mg total) by mouth 3 (three) times daily. Patient taking differently: Take 7.5 mg by mouth See admin instructions. Take 7.5 mg by mouth in the morning and at bedtime.  Take an additional 7.5 mg once a day as needed for anxiety. 05/27/20  Yes Barbette Merino, NP  cloNIDine (CATAPRES) 0.2 MG tablet Take 1 tablet (0.2 mg total) by mouth 3 (three) times daily. 05/27/20 05/27/21 Yes King, Shana Chute, NP  losartan (COZAAR) 25 MG tablet Take 1 tablet (25 mg total) by mouth daily. 05/27/20 05/27/21 Yes Barbette Merino, NP  metFORMIN (GLUCOPHAGE) 500 MG tablet Take 1 tablet (500 mg total) by mouth 2 (two) times daily with a meal. 06/06/20 06/06/21 Yes Barbette Merino, NP  metoprolol tartrate (LOPRESSOR) 25 MG tablet Take 1 tablet (25 mg total) by mouth 2 (two) times daily. Patient to keep follow up appointment to receive additional  refills. Patient taking differently: Take 25 mg by mouth 2 (two) times daily. 05/27/20 05/27/21 Yes Barbette Merino, NP  simvastatin (ZOCOR) 20 MG tablet Take 1 tablet (20 mg total) by mouth every evening. 06/06/20 06/06/21 Yes Barbette Merino, NP  venlafaxine XR (EFFEXOR-XR) 75 MG 24 hr capsule Take 1 capsule (75 mg total) by mouth daily with breakfast. 05/28/20 05/28/21 Yes Barbette Merino, NP  acetaminophen (TYLENOL) 325 MG tablet Take 2 tablets (650 mg total) by mouth every 6 (six) hours as needed for moderate pain. Patient not taking: Reported on 10/29/2020 04/15/20   Arrien, York Ram, MD    Allergies    Patient has no known allergies.  Review of Systems   Review of Systems  All other systems reviewed and are negative. Ten systems reviewed and are negative for acute change, except as noted in the HPI.   Physical Exam Updated Vital Signs BP (!) 148/108   Pulse 87   Temp 97.7 F (36.5 C) (Oral)   Resp 17   Ht 5\' 10"  (1.778 m)   Wt 86.2 kg   SpO2 96%   BMI 27.26 kg/m   Physical Exam Vitals and nursing note reviewed.  Constitutional:      General: He is not in acute distress.    Appearance: Normal appearance. He is well-developed and normal weight. He is not ill-appearing, toxic-appearing or diaphoretic.  HENT:     Head: Normocephalic and atraumatic.     Right Ear: External ear normal.     Left Ear: External ear normal.     Nose: Nose normal.     Mouth/Throat:     Mouth: Mucous membranes are moist.     Pharynx: Oropharynx is clear. No oropharyngeal exudate or posterior oropharyngeal erythema.  Eyes:     Extraocular Movements: Extraocular movements intact.  Cardiovascular:     Rate and Rhythm: Regular rhythm. Tachycardia present.     Pulses: Normal pulses.     Heart sounds: Normal heart sounds. No murmur heard.   No friction rub. No gallop.  Pulmonary:     Effort: Pulmonary effort is normal. No respiratory distress.     Breath sounds: Normal breath sounds. No stridor. No  wheezing, rhonchi or rales.  Abdominal:     General: Abdomen is flat.     Palpations: Abdomen is soft.     Tenderness: There is abdominal tenderness in the epigastric area.  Musculoskeletal:        General: Normal range of motion.     Cervical back: Normal range of motion and neck supple. No tenderness.  Skin:    General: Skin is warm and dry.  Neurological:     General: No focal deficit present.     Mental Status: He is alert and oriented  to person, place, and time.     Comments: A&O x3.  Speaking clearly, coherently, and in complete sentences.  Moving all 4 extremities with ease.  Psychiatric:        Mood and Affect: Mood normal.        Behavior: Behavior normal.   ED Results / Procedures / Treatments   Labs (all labs ordered are listed, but only abnormal results are displayed) Labs Reviewed  COMPREHENSIVE METABOLIC PANEL - Abnormal; Notable for the following components:      Result Value   Glucose, Bld 322 (*)    Calcium 10.4 (*)    Total Protein 8.5 (*)    All other components within normal limits  CBC WITH DIFFERENTIAL/PLATELET - Abnormal; Notable for the following components:   Hemoglobin 17.9 (*)    Monocytes Absolute 1.1 (*)    All other components within normal limits  BLOOD GAS, VENOUS - Abnormal; Notable for the following components:   pH, Ven 7.444 (*)    pCO2, Ven 38.1 (*)    pO2, Ven 71.1 (*)    Acid-Base Excess 2.2 (*)    All other components within normal limits  CBG MONITORING, ED - Abnormal; Notable for the following components:   Glucose-Capillary 333 (*)    All other components within normal limits  LIPASE, BLOOD  ETHANOL  MAGNESIUM  TSH  URINALYSIS, ROUTINE W REFLEX MICROSCOPIC  RAPID URINE DRUG SCREEN, HOSP PERFORMED  TROPONIN I (HIGH SENSITIVITY)   EKG EKG Interpretation  Date/Time:  Friday October 29 2020 16:42:24 EDT Ventricular Rate:  131 PR Interval:  145 QRS Duration: 88 QT Interval:  261 QTC Calculation: 386 R Axis:   82 Text  Interpretation: Sinus tachycardia LAE, consider biatrial enlargement Repol abnrm suggests ischemia, diffuse leads no sig change from previous Confirmed by Arby BarrettePfeiffer, Marcy [phone]) on 10/29/2020 8:18:38 PM  Radiology No results found.  Procedures Procedures   Medications Ordered in ED Medications  LORazepam (ATIVAN) injection 0-4 mg (2 mg Intravenous Given 10/29/20 1810)    Or  LORazepam (ATIVAN) tablet 0-4 mg ( Oral See Alternative 10/29/20 1810)  LORazepam (ATIVAN) injection 0-4 mg (has no administration in time range)    Or  LORazepam (ATIVAN) tablet 0-4 mg (has no administration in time range)  thiamine tablet 100 mg (100 mg Oral Given 10/29/20 1810)    Or  thiamine (B-1) injection 100 mg ( Intravenous See Alternative 10/29/20 1810)  lactated ringers bolus 1,000 mL (0 mLs Intravenous Stopped 10/29/20 1926)  lactated ringers bolus 500 mL (0 mLs Intravenous Stopped 10/29/20 1926)  cloNIDine (CATAPRES) tablet 0.2 mg (0.2 mg Oral Given 10/29/20 2038)  metoprolol tartrate (LOPRESSOR) tablet 25 mg (25 mg Oral Given 10/29/20 2038)  LORazepam (ATIVAN) injection 2 mg (2 mg Intravenous Given 10/29/20 2039)  lactated ringers bolus 1,000 mL (1,000 mLs Intravenous New Bag/Given 10/29/20 2038)   ED Course  I have reviewed the triage vital signs and the nursing notes.  Pertinent labs & imaging results that were available during my care of the patient were reviewed by me and considered in my medical decision making (see chart for details).  Clinical Course as of 10/29/20 2246  Fri Oct 29, 2020  1742 Initial CIWA score of 6.  Very mild tremors noted on my exam.  A&O x3.  Denying any hallucinations.  Given patient's history of DTs will give 2 mg of IV Ativan.  IV fluids.  We will continue to closely monitor. [LJ]    Clinical Course User  Index [LJ] Placido Sou, PA-C   MDM Rules/Calculators/A&P                          Pt is a 40 y.o. male with history of alcohol abuse, pancreatitis, who presents to the  emergency department due to alcohol withdrawals as well as upper abdominal pain.  Labs: CBC with a hemoglobin of 17.9 and monocytes of 1.1. CMP with a glucose of 322, calcium of 10.4, total protein of 8.5. Lipase within normal limits at 33. Glucose of 333. Ethanol less than 10. Troponin of 3. Magnesium 1.7. TSH of 1.212. VBG with a pH of 7.444, PCO2 of 38.1, PO2 71.1.  I, Placido Sou, PA-C, personally reviewed and evaluated these images and lab results as part of my medical decision-making.  Initial CIWA of 6.  Patient persistently tachycardic and hypertensive since arrival.  Given his history of alcohol abuse as well as his history of DTs he was given IV Ativan as well as IV fluids.  Patient still hypertensive as well as tachycardic so he was given a dose of metoprolol, clonidine, as well as additional IV fluids and Ativan.  Heart rate has improved to the high 80s but patient's blood pressure still significantly elevated at 148/108.  Lipase reassuring at 33.  He does have some mild epigastric pain.  Doubt pancreatitis at this time.  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  Given patient's degree of alcohol use, history of DTs, as well as hypertensive urgency today, feel that admission for CIWA as well as further management of his blood pressure is reasonable.  We will discuss with the medicine team.  Note: Portions of this report may have been transcribed using voice recognition software. Every effort was made to ensure accuracy; however, inadvertent computerized transcription errors may be present.   Final Clinical Impression(s) / ED Diagnoses Final diagnoses:  Alcohol withdrawal syndrome with complication [facility])  Hypertensive urgency   Rx / DC Orders ED Discharge Orders     None        Placido Sou, PA-C 10/29/20 2248    Arby Barrette, MD 11/08/20 [phone]

## 2020-10-29 NOTE — ED Provider Notes (Signed)
Emergency Medicine Provider Triage Evaluation Note  Peter [name] , a 40 y.o. male  was evaluated in triage.  Pt complains of alcohol withdrawal. He last drank around 2 AM this morning. He typically drinks a 1/5th of liquor per day. He states he has been through withdrawal in the past and this feels similar. He complains of abdominal pain mostly to LUQ, nausea, vomiting, and tremors. He also has hx of pancreatitis. No fevers or chills.  Review of Systems  Positive: + abdominal pain, nausea, vomiting, tremors Negative: - chest pain, SOB, hallucinations  Physical Exam  BP (!) 167/123 (BP Location: Left Arm)   Pulse (!) 141   Temp 97.7 F (36.5 C) (Oral)   Resp 20   Ht 5\' 10"  (1.778 m)   Wt 86.2 kg   SpO2 95%   BMI 27.26 kg/m  Gen:   Awake, no distress   Resp:  Normal effort  Cards:   Tachy MSK:   Moves extremities without difficulty  Other:  Tremulous. LUQ TTP.   Medical Decision Making  Medically screening exam initiated at 4:29 PM.  Appropriate orders placed.  Armen Waring was informed that the remainder of the evaluation will be completed by another provider, this initial triage assessment does not replace that evaluation, and the importance of remaining in the ED until their evaluation is complete.     Nicholes Stairs, PA-C 10/29/20 1631    12/29/20, MD 10/29/20 [phone]

## 2020-10-29 NOTE — ED Notes (Signed)
RN asked patient to provide urine sample. Patient said "I still can't go."

## 2020-10-29 NOTE — ED Triage Notes (Signed)
Reports abdominal pain, pancreatitis' x1 week or so, reports he started using alcohol again for the past 2 months, 1/5 of liquor per day, last drink was at 2 AM. Reports 'a little' nausea and emesis.

## 2020-10-29 NOTE — ED Provider Notes (Signed)
I provided a substantive portion of the care of this patient.  I personally performed the entirety of the medical decision making for this encounter.  EKG Interpretation  Date/Time:  Friday October 29 2020 16:42:24 EDT Ventricular Rate:  131 PR Interval:  145 QRS Duration: 88 QT Interval:  261 QTC Calculation: 386 R Axis:   82 Text Interpretation: Sinus tachycardia LAE, consider biatrial enlargement Repol abnrm suggests ischemia, diffuse leads no sig change from previous Confirmed by Arby Barrette [phone]) on 10/29/2020 8:18:38 PM   Medical screening examination/treatment/procedure(s) were conducted as a shared visit with non-physician practitioner(s) and myself.  I personally evaluated the patient during the encounter.  See separately dictated addendum for shared encounter     Arby Barrette, MD 11/08/20 [phone]

## 2020-10-29 NOTE — H&P (Signed)
History and Physical   Peter [name] ZES:[id number] DOB: [birth year] DOA: 10/29/2020  Referring MD/NP/PA: Dr. Clarice Pole  PCP: Kallie Locks, FNP (Inactive)   Outpatient Specialists: None  Patient coming from: Home  Chief Complaint: Abdominal pain and palpitations  HPI: Peter [name] is a 40 y.o. male with medical history significant of alcohol abuse, diabetes with previous DKA's, substance abuse, benzodiazepines abuse, essential hypertension who came to the ER with abdominal pain.  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  He quit drinking for some time and now back to drinking again.  Last drink was yesterday.  Normally drinks about 1/5 of liquor per day.  And he still been doing there for 2 months now.  He notices having difficulty with his work.  When he was drinking around 2 AM this morning he started having nausea with some vomiting.  No diarrhea.  No hematemesis or melena.  Patient wants to try to quit again.  He is showing signs of withdrawal in the ER.  CIWA score was 6 and started on CIWA protocol.  He has been persistently tachycardic and hypertensive.  His home regimen has been resumed but patient continues to be tachycardic.  Currently blood pressure getting better.  Due to what appears to be new onset and early DTs patient is being admitted to the medical service for further evaluation and treatment..  ED Course: Temperature 97.7, blood pressure 174/128, pulse 140, respirate of 28 and oxygen sat 93% on room air.  Hemoglobin 17.9 calcium 10.4 glucose 322 otherwise CBC and chemistry was within normal.  Urine drug screen is positive for benzodiazepine.  Alcohol level less than 10.  Urinalysis negative.  Review of Systems: As per HPI otherwise 10 point review of systems negative.    Past Medical History:  Diagnosis Date   Alcoholism (HCC)    DKA (diabetic ketoacidosis) (HCC) 04/04/2020   Hypertension    Substance abuse (HCC)     Past Surgical History:   Procedure Laterality Date   DENTAL SURGERY     TIBIA IM NAIL INSERTION Right 01/22/2018   Procedure: RIGHT INTRAMEDULLARY (IM) NAIL TIBIAL;  Surgeon: Sheral Apley, MD;  Location: [facility] OR;  Service: Orthopedics;  Laterality: Right;     reports that he has been smoking cigarettes. He has been smoking an average of .5 packs per day. He has never used smokeless tobacco. He reports that he does not currently use alcohol. He reports that he does not use drugs.  No Known Allergies  Family History  Problem Relation Age of Onset   Hypertension Father    Stroke Father    CAD Father    Cancer Paternal Grandfather        Lung   Hyperlipidemia Paternal Grandfather    Hypertension Paternal Grandfather      Prior to Admission medications   Medication Sig Start Date End Date Taking? Authorizing Provider  amLODipine (NORVASC) 10 MG tablet Take 1 tablet (10 mg total) by mouth daily. 05/27/20 05/27/21 Yes King, Shana Chute, NP  busPIRone (BUSPAR) 7.5 MG tablet Take 1 tablet (7.5 mg total) by mouth 3 (three) times daily. Patient taking differently: Take 7.5 mg by mouth See admin instructions. Take 7.5 mg by mouth in the morning and at bedtime. Take an additional 7.5 mg once a day as needed for anxiety. 05/27/20  Yes Barbette Merino, NP  cloNIDine (CATAPRES) 0.2 MG tablet Take 1 tablet (0.2 mg total) by mouth 3 (three) times daily. 05/27/20 05/27/21 Yes Brooke Dare,  Crystal M, NP  losartan (COZAAR) 25 MG tablet Take 1 tablet (25 mg total) by mouth daily. 05/27/20 05/27/21 Yes Barbette MerinoKing, Crystal M, NP  metFORMIN (GLUCOPHAGE) 500 MG tablet Take 1 tablet (500 mg total) by mouth 2 (two) times daily with a meal. 06/06/20 06/06/21 Yes Barbette MerinoKing, Crystal M, NP  metoprolol tartrate (LOPRESSOR) 25 MG tablet Take 1 tablet (25 mg total) by mouth 2 (two) times daily. Patient to keep follow up appointment to receive additional refills. Patient taking differently: Take 25 mg by mouth 2 (two) times daily. 05/27/20 05/27/21 Yes Barbette MerinoKing, Crystal M, NP   simvastatin (ZOCOR) 20 MG tablet Take 1 tablet (20 mg total) by mouth every evening. 06/06/20 06/06/21 Yes Barbette MerinoKing, Crystal M, NP  venlafaxine XR (EFFEXOR-XR) 75 MG 24 hr capsule Take 1 capsule (75 mg total) by mouth daily with breakfast. 05/28/20 05/28/21 Yes Barbette MerinoKing, Crystal M, NP  acetaminophen (TYLENOL) 325 MG tablet Take 2 tablets (650 mg total) by mouth every 6 (six) hours as needed for moderate pain. Patient not taking: Reported on 10/29/2020 04/15/20   Coralie KeensArrien, Mauricio Daniel, MD    Physical Exam: Vitals:   10/29/20 2130 10/29/20 2200 10/29/20 2230 10/29/20 2300  BP: (!) 158/132 (!) 155/121 (!) 148/108 (!) 136/115  Pulse: (!) 109 92 87 82  Resp: 17 18 17  (!) 28  Temp:      TempSrc:      SpO2: 97% 93% 96% 96%  Weight:      Height:          Constitutional: Acutely ill looking, tremulous Vitals:   10/29/20 2130 10/29/20 2200 10/29/20 2230 10/29/20 2300  BP: (!) 158/132 (!) 155/121 (!) 148/108 (!) 136/115  Pulse: (!) 109 92 87 82  Resp: 17 18 17  (!) 28  Temp:      TempSrc:      SpO2: 97% 93% 96% 96%  Weight:      Height:       Eyes: PERRL, lids and conjunctivae normal ENMT: Mucous membranes are dry posterior pharynx clear of any exudate or lesions.Normal dentition.  Neck: normal, supple, no masses, no thyromegaly Respiratory: clear to auscultation bilaterally, no wheezing, no crackles. Normal respiratory effort. No accessory muscle use.  Cardiovascular: Sinus tachycardia, no murmurs / rubs / gallops. No extremity edema. 2+ pedal pulses. No carotid bruits.  Abdomen: no tenderness, no masses palpated. No hepatosplenomegaly. Bowel sounds positive.  Musculoskeletal: no clubbing / cyanosis. No joint deformity upper and lower extremities. Good ROM, no contractures. Normal muscle tone.  Skin: no rashes, lesions, ulcers. No induration Neurologic: CN 2-12 grossly intact. Sensation intact, DTR normal. Strength 5/5 in all 4.  Psychiatric: Normal judgment and insight. Alert and oriented x 3.   Anxious mood.     Labs on Admission: I have personally reviewed following labs and imaging studies  CBC: Recent Labs  Lab 10/29/20 1630  WBC 9.7  NEUTROABS 6.2  HGB 17.9*  HCT 50.9  MCV 89.6  PLT 233   Basic Metabolic Panel: Recent Labs  Lab 10/29/20 1630 10/29/20 2040  NA 137  --   K 4.0  --   CL 98  --   CO2 24  --   GLUCOSE 322*  --   BUN 16  --   CREATININE 0.77  --   CALCIUM 10.4*  --   MG  --  1.7   GFR: Estimated Creatinine Clearance: 128 mL/min (by C-G formula based on SCr of 0.77 mg/dL). Liver Function Tests: Recent Labs  Lab  10/29/20 1630  AST 35  ALT 41  ALKPHOS 95  BILITOT 0.7  PROT 8.5*  ALBUMIN 4.3   Recent Labs  Lab 10/29/20 1630  LIPASE 33   No results for input(s): AMMONIA in the last 168 hours. Coagulation Profile: No results for input(s): INR, PROTIME in the last 168 hours. Cardiac Enzymes: No results for input(s): CKTOTAL, CKMB, CKMBINDEX, TROPONINI in the last 168 hours. BNP (last 3 results) No results for input(s): PROBNP in the last 8760 hours. HbA1C: No results for input(s): HGBA1C in the last 72 hours. CBG: Recent Labs  Lab 10/29/20 1633  GLUCAP 333*   Lipid Profile: No results for input(s): CHOL, HDL, LDLCALC, TRIG, CHOLHDL, LDLDIRECT in the last 72 hours. Thyroid Function Tests: Recent Labs    10/29/20 2042  TSH 1.212   Anemia Panel: No results for input(s): VITAMINB12, FOLATE, FERRITIN, TIBC, IRON, RETICCTPCT in the last 72 hours. Urine analysis:    Component Value Date/Time   COLORURINE YELLOW 10/29/2020 2200   APPEARANCEUR CLEAR 10/29/2020 2200   LABSPEC 1.020 10/29/2020 2200   PHURINE 6.5 10/29/2020 2200   GLUCOSEU >=500 (A) 10/29/2020 2200   HGBUR NEGATIVE 10/29/2020 2200   BILIRUBINUR NEGATIVE 10/29/2020 2200   BILIRUBINUR Negative 01/28/2019 1521   KETONESUR 15 (A) 10/29/2020 2200   PROTEINUR 30 (A) 10/29/2020 2200   UROBILINOGEN 0.2 01/28/2019 1521   UROBILINOGEN 0.2 11/13/2016 1234   NITRITE  NEGATIVE 10/29/2020 2200   LEUKOCYTESUR NEGATIVE 10/29/2020 2200   Sepsis Labs: @LABRCNTIP (procalcitonin:4,lacticidven:4) )No results found for this or any previous visit (from the past 240 hour(s)).   Radiological Exams on Admission: No results found.  EKG: Independently reviewed.  Sinus tachycardia  Assessment/Plan Principal Problem:   Alcohol withdrawal (HCC) Active Problems:   Alcohol dependence with other alcohol-induced disorder (HCC)   Essential hypertension   Generalized anxiety disorder   Malignant hypertension   Diabetes (HCC)   Alcohol withdrawal delirium, acute, hyperactive (HCC)     #1 alcohol withdrawals with hyperactive disorder: Patient will be admitted.  Initial observation and will be placed on CIWA protocol.  Aggressive hydration and monitoring.  If patient does well will transition to home regimen.  #2 diabetes: Sliding scale insulin.  Hold metformin  #3 generalized anxiety disorder: Continue treatment.  Continue benzodiazepines.  #4 malignant hypertension: Continue blood pressure treatment.  Resume home regimen.  Labetalol as needed  #5 substance abuse: Counseling provided.   DVT prophylaxis: Lovenox Code Status: Full code Family Communication: No family at bedside Disposition Plan: Home Consults called: None Admission status: Inpatient  Severity of Illness: The appropriate patient status for this patient is INPATIENT. Inpatient status is judged to be reasonable and necessary in order to provide the required intensity of service to ensure the patient's safety. The patient's presenting symptoms, physical exam findings, and initial radiographic and laboratory data in the context of their chronic comorbidities is felt to place them at high risk for further clinical deterioration. Furthermore, it is not anticipated that the patient will be medically stable for discharge from the hospital within 2 midnights of admission. The following factors support the  patient status of inpatient.   " The patient's presenting symptoms include abdominal pain and tremors. " The worrisome physical exam findings include sinus tachycardia. " The initial radiographic and laboratory data are worrisome because of positive benzodiazepines. " The chronic co-morbidities include alcoholism.   * I certify that at the point of admission it is my clinical judgment that the patient will require inpatient hospital  care spanning beyond 2 midnights from the point of admission due to high intensity of service, high risk for further deterioration and high frequency of surveillance required.Lonia Blood MD [facility] Pager [phone]  If 7PM-7AM, please contact night-coverage [email or web address] Password [email or web address]  10/29/2020, 11:05 PM

## 2020-10-30 ENCOUNTER — Encounter (HOSPITAL_COMMUNITY): Payer: Self-pay | Admitting: Internal Medicine

## 2020-10-30 DIAGNOSIS — F10230 Alcohol dependence with withdrawal, uncomplicated: Secondary | ICD-10-CM

## 2020-10-30 LAB — CBC
HCT: 43.4 % (ref 39.0–52.0)
Hemoglobin: 15 g/dL (ref 13.0–17.0)
MCH: 31.7 pg (ref 26.0–34.0)
MCHC: 34.6 g/dL (ref 30.0–36.0)
MCV: 91.8 fL (ref 80.0–100.0)
Platelets: 192 10*3/uL (ref 150–400)
RBC: 4.73 MIL/uL (ref 4.22–5.81)
RDW: 14.1 % (ref 11.5–15.5)
WBC: 8.1 10*3/uL (ref 4.0–10.5)
nRBC: 0 % (ref 0.0–0.2)

## 2020-10-30 LAB — COMPREHENSIVE METABOLIC PANEL
ALT: 31 U/L (ref 0–44)
AST: 27 U/L (ref 15–41)
Albumin: 3.4 g/dL — ABNORMAL LOW (ref 3.5–5.0)
Alkaline Phosphatase: 74 U/L (ref 38–126)
Anion gap: 8 (ref 5–15)
BUN: 13 mg/dL (ref 6–20)
CO2: 27 mmol/L (ref 22–32)
Calcium: 9.4 mg/dL (ref 8.9–10.3)
Chloride: 101 mmol/L (ref 98–111)
Creatinine, Ser: 0.74 mg/dL (ref 0.61–1.24)
GFR, Estimated: >60 mL/min (ref >60)
Glucose, Bld: 299 mg/dL — ABNORMAL HIGH (ref 70–99)
Potassium: 4.1 mmol/L (ref 3.5–5.1)
Sodium: 136 mmol/L (ref 135–145)
Total Bilirubin: 0.7 mg/dL (ref 0.3–1.2)
Total Protein: 6.9 g/dL (ref 6.5–8.1)

## 2020-10-30 LAB — CBG MONITORING, ED: Glucose-Capillary: 316 mg/dL — ABNORMAL HIGH (ref 70–99)

## 2020-10-30 LAB — HEMOGLOBIN A1C
Hgb A1c MFr Bld: 8.8 % — ABNORMAL HIGH (ref 4.8–5.6)
Mean Plasma Glucose: 205.86 mg/dL

## 2020-10-30 LAB — GLUCOSE, CAPILLARY
Glucose-Capillary: 242 mg/dL — ABNORMAL HIGH (ref 70–99)
Glucose-Capillary: 185 mg/dL — ABNORMAL HIGH (ref 70–99)
Glucose-Capillary: 196 mg/dL — ABNORMAL HIGH (ref 70–99)

## 2020-10-30 LAB — RESP PANEL BY RT-PCR (FLU A&B, COVID) ARPGX2
Influenza A by PCR: NEGATIVE
Influenza B by PCR: NEGATIVE
SARS Coronavirus 2 by RT PCR: NEGATIVE

## 2020-10-30 LAB — CREATININE, SERUM
Creatinine, Ser: 0.69 mg/dL (ref 0.61–1.24)
GFR, Estimated: >60 mL/min (ref >60)

## 2020-10-30 MED ORDER — GLIPIZIDE 5 MG PO TABS: 2.5000 mg | ORAL_TABLET | Freq: Two times a day (BID) | ORAL | Status: DC

## 2020-10-30 MED ORDER — METFORMIN HCL 500 MG PO TABS
500.0000 mg | ORAL_TABLET | Freq: Two times a day (BID) | ORAL | Status: DC
  Administered 2020-10-30: 500 mg via ORAL
  Filled 2020-10-30 (×2): qty 1

## 2020-10-30 MED ORDER — GLIPIZIDE 5 MG PO TABS
2.5000 mg | ORAL_TABLET | Freq: Two times a day (BID) | ORAL | Status: DC
  Administered 2020-10-30 (×2): 2.5 mg via ORAL
  Filled 2020-10-30 (×2): qty 1

## 2020-10-30 NOTE — ED Notes (Signed)
Patient given diet soda and sandwich. 

## 2020-10-30 NOTE — Progress Notes (Signed)
PROGRESS NOTE  Peter [name]  DOB: [birth year]  PCP: Kallie Locks, FNP (Inactive) DZH:[id number]  DOA: 10/29/2020  LOS: 1 day  Hospital Day: 2   Chief Complaint  Patient presents with   Abdominal Pain   Alcohol Problem    Brief narrative: Peter [name] is a 40 y.o. male with PMH significant for HTN, DM, history of DKAs, chronic alcohol abuse, history of pancreatitis, substance abuse, benzodiazepines abuse. Patient presented to the ED from home on 9/9 with complaint of abdominal pain, nausea, vomiting. He has a history of alcoholic pancreatitis.  He quit drinking for some time and is now back to drinking again, last drink was on 9/8, normally drinks about 1/5 of liquor every day.   In the ED, patient had a heart rate of 140, blood pressure 174/128 Labs with glucose level elevated to 322, otherwise unremarkable Urine drug screen was positive for benzodiazepine Admitted to hospitalist service for further evaluation and management.  Subjective: Patient was seen and examined this morning.  Pleasant young Caucasian male.  Sitting up at the edge of the bed.  Taking his breakfast.  Feels good overall.  Starting to have tremors this morning.  Does not feel comfortable going home today. Glucose level elevated to 316 this morning.  Assessment/Plan: Acute alcohol withdrawal Chronic alcohol abuse -Chronic alcoholic, presented with tachycardia, hypertension, nausea, vomiting -Felt better overnight but started to have tremors again this morning. -Currently on CIWA protocol with Ativan. -Continue to monitor. -Counseled to quit alcohol.  Relapsed 5 times in the past.  Feels ready to quit again.  Type 2 diabetes mellitus uncontrolled with hyperglycemia -A1c 8.8 on 10/29/2020 -Home meds include metformin 500 mg twice daily not on insulin treatment. -Currently on sliding scale insulin with Accu-Cheks.  Metformin on hold. -I believe would benefit from long-term insulin.  But he would like  to go with pills at this time.  I would resume metformin and also start him on glipizide this morning. Recent Labs  Lab 10/29/20 1633 10/29/20 2334 10/30/20 0730  GLUCAP 333* 241* 316*   Essential hypertension -Home meds include amlodipine 10 mg daily, clonidine 0.2 mg 3 times daily, losartan 25 mg daily, metoprolol 25 mg twice daily -All have been resumed.  Continue to monitor.  Hyperlipidemia -Continue simvastatin 20 mg daily  Anxiety/depression -Include BuSpar, Effexor  Mobility: Encourage ambulation Code Status:   Code Status: Full Code  Nutritional status: Body mass index is 28.28 kg/m.     Diet:  Diet Order             Diet heart healthy/carb modified Room service appropriate? Yes; Fluid consistency: Thin  Diet effective now                  DVT prophylaxis:  enoxaparin (LOVENOX) injection 40 mg Start: 10/30/20 1000   Antimicrobials: None Fluid: LR@100  Consultants: None Family Communication: None at bedside  Status is: Inpatient  Remains inpatient appropriate because: Needs IV hydration  Dispo: The patient is from: Home              Anticipated d/c is to: Home              Patient currently is not medically stable to d/c.   Difficult to place patient No     Infusions:   lactated ringers 100 mL/hr at 10/29/20 2347    Scheduled Meds:  amLODipine  10 mg Oral Daily   busPIRone  7.5 mg Oral BID   cloNIDine  0.2  mg Oral TID   enoxaparin (LOVENOX) injection  40 mg Subcutaneous Q24H   glipiZIDE  2.5 mg Oral BID AC   insulin aspart  0-5 Units Subcutaneous QHS   insulin aspart  0-9 Units Subcutaneous TID WC   LORazepam  0-4 mg Intravenous Q6H   Or   LORazepam  0-4 mg Oral Q6H   [START ON 11/01/2020] LORazepam  0-4 mg Intravenous Q12H   Or   [START ON 11/01/2020] LORazepam  0-4 mg Oral Q12H   losartan  25 mg Oral Daily   metFORMIN  500 mg Oral BID WC   metoprolol tartrate  25 mg Oral BID   simvastatin  20 mg Oral QPM   thiamine  100 mg Oral  Daily   Or   thiamine  100 mg Intravenous Daily   venlafaxine XR  75 mg Oral Q breakfast    Antimicrobials: Anti-infectives (From admission, onward)    None       PRN meds: busPIRone, metoprolol tartrate, morphine injection, ondansetron **OR** ondansetron (ZOFRAN) IV   Objective: Vitals:   10/30/20 0600 10/30/20 0820  BP: 115/86 (!) 167/110  Pulse: 67 77  Resp: (!) 21 19  Temp:  98.7 F (37.1 C)  SpO2: 93% 99%    Intake/Output Summary (Last 24 hours) at 10/30/2020 1213 Last data filed at 10/29/2020 2248 Gross per 24 hour  Intake 2500 ml  Output --  Net 2500 ml   Filed Weights   10/29/20 1615 10/30/20 0820  Weight: 86.2 kg 89.4 kg   Weight change:  Body mass index is 28.28 kg/m.   Physical Exam: General exam: Pleasant, young Caucasian male.  Looks older for his age Skin: No rashes, lesions or ulcers. HEENT: Atraumatic, normocephalic, no obvious bleeding Lungs: Clear to auscultation bilaterally CVS: Regular rate and rhythm, no murmur GI/Abd soft, nontender, nondistended, bowel sound present CNS: Alert, awake, oriented x3, mild alcohol tremors in hands Psychiatry: Depressed look. Extremities: No pedal edema, no calf tenderness  Data Review: I have personally reviewed the laboratory data and studies available.  Recent Labs  Lab 10/29/20 1630 10/29/20 2327 10/30/20 0410  WBC 9.7 7.9 8.1  NEUTROABS 6.2  --   --   HGB 17.9* 14.5 15.0  HCT 50.9 41.0 43.4  MCV 89.6 90.7 91.8  PLT 233 153 192   Recent Labs  Lab 10/29/20 1630 10/29/20 2040 10/29/20 2327 10/30/20 0410  NA 137  --   --  136  K 4.0  --   --  4.1  CL 98  --   --  101  CO2 24  --   --  27  GLUCOSE 322*  --   --  299*  BUN 16  --   --  13  CREATININE 0.77  --  0.69 0.74  CALCIUM 10.4*  --   --  9.4  MG  --  1.7  --   --     F/u labs ordered Unresulted Labs (From admission, onward)     Start     Ordered   11/05/20 0500  Creatinine, serum  (enoxaparin (LOVENOX)    CrCl >/= 30 ml/min)   Weekly,   R     Comments: while on enoxaparin therapy    10/29/20 2326   10/31/20 0500  Basic metabolic panel  Daily,   R     Question:  Specimen collection method  Answer:  Lab=Lab collect   10/30/20 1213   10/31/20 0500  CBC with Differential/Platelet  Daily,   R     Question:  Specimen collection method  Answer:  Lab=Lab collect   10/30/20 1213   10/31/20 0500  Magnesium  Tomorrow morning,   STAT       Question:  Specimen collection method  Answer:  Lab=Lab collect   10/30/20 1213   10/31/20 0500  Phosphorus  Tomorrow morning,   R       Question:  Specimen collection method  Answer:  Lab=Lab collect   10/30/20 1213            Signed, Lorin Glass, MD [facility] 10/30/2020

## 2020-10-31 LAB — MAGNESIUM: Magnesium: 1.5 mg/dL — ABNORMAL LOW (ref 1.7–2.4)

## 2020-10-31 LAB — BASIC METABOLIC PANEL
Anion gap: 8 (ref 5–15)
BUN: 9 mg/dL (ref 6–20)
CO2: 28 mmol/L (ref 22–32)
Calcium: 9.1 mg/dL (ref 8.9–10.3)
Chloride: 99 mmol/L (ref 98–111)
Creatinine, Ser: 0.66 mg/dL (ref 0.61–1.24)
GFR, Estimated: >60 mL/min (ref >60)
Glucose, Bld: 223 mg/dL — ABNORMAL HIGH (ref 70–99)
Potassium: 3.9 mmol/L (ref 3.5–5.1)
Sodium: 135 mmol/L (ref 135–145)

## 2020-10-31 LAB — CBC WITH DIFFERENTIAL/PLATELET
Abs Immature Granulocytes: 0.04 10*3/uL (ref 0.00–0.07)
Basophils Absolute: 0 10*3/uL (ref 0.0–0.1)
Basophils Relative: 0 %
Eosinophils Absolute: 0.1 10*3/uL (ref 0.0–0.5)
Eosinophils Relative: 1 %
HCT: 41 % (ref 39.0–52.0)
Hemoglobin: 14.3 g/dL (ref 13.0–17.0)
Immature Granulocytes: 1 %
Lymphocytes Relative: 22 %
Lymphs Abs: 1.6 10*3/uL (ref 0.7–4.0)
MCH: 31.6 pg (ref 26.0–34.0)
MCHC: 34.9 g/dL (ref 30.0–36.0)
MCV: 90.5 fL (ref 80.0–100.0)
Monocytes Absolute: 0.6 10*3/uL (ref 0.1–1.0)
Monocytes Relative: 9 %
Neutro Abs: 4.9 10*3/uL (ref 1.7–7.7)
Neutrophils Relative %: 67 %
Platelets: 152 10*3/uL (ref 150–400)
RBC: 4.53 MIL/uL (ref 4.22–5.81)
RDW: 13.5 % (ref 11.5–15.5)
WBC: 7.2 10*3/uL (ref 4.0–10.5)
nRBC: 0 % (ref 0.0–0.2)

## 2020-10-31 LAB — PHOSPHORUS: Phosphorus: 3.9 mg/dL (ref 2.5–4.6)

## 2020-10-31 LAB — GLUCOSE, CAPILLARY
Glucose-Capillary: 227 mg/dL — ABNORMAL HIGH (ref 70–99)
Glucose-Capillary: 231 mg/dL — ABNORMAL HIGH (ref 70–99)

## 2020-10-31 MED ORDER — AMLODIPINE BESYLATE 10 MG PO TABS: 10.0000 mg | ORAL_TABLET | Freq: Every day | ORAL | 2 refills | Status: DC

## 2020-10-31 MED ORDER — METFORMIN HCL 500 MG PO TABS
1000.0000 mg | ORAL_TABLET | Freq: Two times a day (BID) | ORAL | Status: DC
  Administered 2020-10-31: 1000 mg via ORAL
  Filled 2020-10-31: qty 2

## 2020-10-31 MED ORDER — MAGNESIUM SULFATE 2 GM/50ML IV SOLN
2.0000 g | Freq: Once | INTRAVENOUS | Status: AC
  Administered 2020-10-31: 2 g via INTRAVENOUS
  Filled 2020-10-31: qty 50

## 2020-10-31 MED ORDER — LOSARTAN POTASSIUM 25 MG PO TABS: 25.0000 mg | ORAL_TABLET | Freq: Every day | ORAL | 2 refills | Status: DC

## 2020-10-31 MED ORDER — CLONIDINE HCL 0.2 MG PO TABS: 0.2000 mg | ORAL_TABLET | Freq: Two times a day (BID) | ORAL | 2 refills | Status: DC

## 2020-10-31 MED ORDER — METFORMIN HCL 1000 MG PO TABS
1000.0000 mg | ORAL_TABLET | Freq: Two times a day (BID) | ORAL | 2 refills | Status: DC
Start: 2020-10-31 — End: 2022-09-21

## 2020-10-31 MED ORDER — VENLAFAXINE HCL ER 75 MG PO CP24: 75.0000 mg | ORAL_CAPSULE | Freq: Every day | ORAL | 0 refills | Status: AC

## 2020-10-31 MED ORDER — METOPROLOL TARTRATE 25 MG PO TABS: 25.0000 mg | ORAL_TABLET | Freq: Two times a day (BID) | ORAL | 2 refills | Status: DC

## 2020-10-31 MED ORDER — GLIPIZIDE 5 MG PO TABS
5.0000 mg | ORAL_TABLET | Freq: Two times a day (BID) | ORAL | Status: DC
  Administered 2020-10-31: 5 mg via ORAL

## 2020-10-31 MED ORDER — GLIPIZIDE 5 MG PO TABS: 5.0000 mg | ORAL_TABLET | Freq: Two times a day (BID) | ORAL | 2 refills | Status: DC

## 2020-10-31 MED ORDER — BUSPIRONE HCL 7.5 MG PO TABS: 7.5000 mg | ORAL_TABLET | Freq: Two times a day (BID) | ORAL | 0 refills | Status: AC

## 2020-10-31 MED ORDER — SIMVASTATIN 20 MG PO TABS: 20.0000 mg | ORAL_TABLET | Freq: Every day | ORAL | 2 refills | Status: DC

## 2020-10-31 NOTE — Discharge Summary (Signed)
Physician Discharge Summary  Peter [name] ZDG:[id number] DOB: Jun 06, 1980 DOA: 10/29/2020  PCP: Kallie Locks, FNP (Inactive)  Admit date: 10/29/2020 Discharge date: 10/31/2020  Admitted From: Home Discharge disposition: Home   Code Status: Full Code   Discharge Diagnosis:   Principal Problem:   Alcohol withdrawal (HCC) Active Problems:   Alcohol dependence with other alcohol-induced disorder (HCC)   Essential hypertension   Generalized anxiety disorder   Malignant hypertension   Diabetes (HCC)   Alcohol withdrawal delirium, acute, hyperactive [facility])    Chief Complaint  Patient presents with   Abdominal Pain   Alcohol Problem    Brief narrative: Peter [name] is a 40 y.o. male with PMH significant for HTN, DM, history of DKAs, chronic alcohol abuse, history of pancreatitis, substance abuse, benzodiazepines abuse. Patient presented to the ED from home on 9/9 with complaint of abdominal pain, nausea, vomiting. He has a history of alcoholic pancreatitis.  He quit drinking for some time and is now back to drinking again, last drink was on 9/8, normally drinks about 1/5 of liquor every day.   In the ED, patient had a heart rate of 140, blood pressure 174/128 Labs with glucose level elevated to 322, otherwise unremarkable Urine drug screen was positive for benzodiazepine Admitted to hospitalist service for further evaluation and management.  Subjective: Patient was seen and examined this morning.  Lying in bed.  Not in distress.  No tremors.  Feels good.  Feels ready to go home today.  Hospital course Acute alcohol withdrawal Chronic alcohol abuse -Chronic alcoholic, presented with tachycardia, hypertension, nausea, vomiting -He was placed on CIWA protocol with oral and IV Ativan.  Symptoms improved.  Feels ready for discharge today. -Counseled to quit alcohol.  Relapsed 5 times in the past.  Feels ready to quit again.  He says he is going to his mom's house and does  not have alcohol over there.  Type 2 diabetes mellitus uncontrolled with hyperglycemia -A1c 8.8 on 10/29/2020 -Previously on metformin 500 mg twice daily. -I believe, he would benefit from long-term insulin.  But he would like to avoid insulin at this time and stick with pills.  I would increase metformin to 1000 mg twice daily and add glipizide 2.5 mg twice daily.  Scripts sent. Recent Labs  Lab 10/30/20 1220 10/30/20 1705 10/30/20 2130 10/31/20 0727 10/31/20 1135  GLUCAP 242* 185* 196* 227* 231*   Essential hypertension -Home meds include amlodipine 10 mg daily, clonidine 0.2 mg 2 times daily, losartan 25 mg daily, metoprolol 25 mg twice daily -All of them resumed.  Blood pressure controlled at this time.  He asked me for prescription of oral these medications.  Hyperlipidemia -Continue simvastatin 20 mg daily  Anxiety/depression -Continue BuSpar, Effexor   Allergies as of 10/31/2020   No Known Allergies      Medication List     STOP taking these medications    acetaminophen 325 MG tablet Commonly known as: TYLENOL       TAKE these medications    amLODipine 10 MG tablet Commonly known as: NORVASC Take 1 tablet (10 mg total) by mouth daily.   busPIRone 7.5 MG tablet Commonly known as: BUSPAR Take 1 tablet (7.5 mg total) by mouth 2 (two) times daily. What changed: when to take this   cloNIDine 0.2 MG tablet Commonly known as: CATAPRES Take 1 tablet (0.2 mg total) by mouth 2 (two) times daily. What changed: when to take this   glipiZIDE 5 MG tablet Commonly known  as: GLUCOTROL Take 1 tablet (5 mg total) by mouth 2 (two) times daily before a meal.   losartan 25 MG tablet Commonly known as: COZAAR Take 1 tablet (25 mg total) by mouth daily.   metFORMIN 1000 MG tablet Commonly known as: Glucophage Take 1 tablet (1,000 mg total) by mouth 2 (two) times daily with a meal. What changed:  medication strength how much to take   metoprolol tartrate 25 MG  tablet Commonly known as: LOPRESSOR Take 1 tablet (25 mg total) by mouth 2 (two) times daily.   simvastatin 20 MG tablet Commonly known as: Zocor Take 1 tablet (20 mg total) by mouth at bedtime. What changed: when to take this   venlafaxine XR 75 MG 24 hr capsule Commonly known as: EFFEXOR-XR Take 1 capsule (75 mg total) by mouth daily with breakfast.        Discharge Instructions:  Diet Recommendation:  Discharge Diet Orders (From admission, onward)     Start     Ordered   10/31/20 0000  Diet Carb Modified        10/31/20 1149   10/31/20 0000  Diet - low sodium heart healthy        10/31/20 1149              Follow with Primary MD Kallie Locks, FNP (Inactive) in 7 days   Get CBC/BMP checked in next visit within 1 week by PCP or SNF MD ( we routinely change or add medications that can affect your baseline labs and fluid status, therefore we recommend that you get the mentioned basic workup next visit with your PCP, your PCP may decide not to get them or add new tests based on their clinical decision)  On your next visit with your PCP, please Get Medicines reviewed and adjusted.  Please request your PCP  to go over all Hospital Tests and Procedure/Radiological results at the follow up, please get all Hospital records sent to your Prim MD by signing hospital release before you go home.  Activity: As tolerated with Full fall precautions use walker/cane & assistance as needed  For Heart failure patients - Check your Weight same time everyday, if you gain over 2 pounds, or you develop in leg swelling, experience more shortness of breath or chest pain, call your Primary MD immediately. Follow Cardiac Low Salt Diet and 1.5 lit/day fluid restriction.  If you have smoked or chewed Tobacco in the last 2 yrs please stop smoking, stop any regular Alcohol  and or any Recreational drug use.  If you experience worsening of your admission symptoms, develop shortness of breath,  life threatening emergency, suicidal or homicidal thoughts you must seek medical attention immediately by calling 911 or calling your MD immediately  if symptoms less severe.  You Must read complete instructions/literature along with all the possible adverse reactions/side effects for all the Medicines you take and that have been prescribed to you. Take any new Medicines after you have completely understood and accpet all the possible adverse reactions/side effects.   Do not drive, operate heavy machinery, perform activities at heights, swimming or participation in water activities or provide baby sitting services if your were admitted for syncope or siezures until you have seen by Primary MD or a Neurologist and advised to do so again.  Do not drive when taking Pain medications.  Do not take more than prescribed Pain, Sleep and Anxiety Medications  Wear Seat belts while driving.   Please note You  were cared for by a hospitalist during your hospital stay. If you have any questions about your discharge medications or the care you received while you were in the hospital after you are discharged, you can call the unit and asked to speak with the hospitalist on call if the hospitalist that took care of you is not available. Once you are discharged, your primary care physician will handle any further medical issues. Please note that NO REFILLS for any discharge medications will be authorized once you are discharged, as it is imperative that you return to your primary care physician (or establish a relationship with a primary care physician if you do not have one) for your aftercare needs so that they can reassess your need for medications and monitor your lab values.    Follow ups:    Follow-up Information     Kallie Locks, FNP Follow up.   Specialty: Family Medicine Contact information: [address] [place] [address] [phone]                 Wound care:   Incision  (Closed) 01/22/18 Leg Right (Active)  Date First Assessed/Time First Assessed: 01/22/18 1559   Location: Leg  Location Orientation: Right    Assessments 01/22/2018  4:23 PM 01/23/2018  9:29 AM  Dressing Type Compression wrap Compression wrap  Dressing Clean;Dry;Intact Clean;Dry;Intact  Site / Wound Assessment Dressing in place / Unable to assess Dressing in place / Unable to assess  Drainage Amount None None     No Linked orders to display    Discharge Exam:   Vitals:   10/30/20 1620 10/30/20 2128 10/31/20 0406 10/31/20 0954  BP: (!) 141/94 (!) 141/93 122/89 (!) 136/97  Pulse: 72 80 (!) 59 71  Resp: 19 20 13 18   Temp: 97.9 F (36.6 C) 98.1 F (36.7 C) (!) 97.4 F (36.3 C) 98 F (36.7 C)  TempSrc: Oral Oral Oral Oral  SpO2: 95% 96% 97% 98%  Weight:      Height:        Body mass index is 28.28 kg/m.  General exam: Pleasant, young Caucasian male.  Not in distress Skin: No rashes, lesions or ulcers. HEENT: Atraumatic, normocephalic, no obvious bleeding Lungs: Clear to auscultation bilaterally CVS: Regular rate and rhythm, no murmur GI/Abd soft, nontender, nondistended, bowel sound present CNS: Alert, awake, oriented x3 Psychiatry: Mood appropriate Extremities: No pedal edema, no calf tenderness  Time coordinating discharge: 35 minutes   The results of significant diagnostics from this hospitalization (including imaging, microbiology, ancillary and laboratory) are listed below for reference.    Procedures and Diagnostic Studies:   No results found.   Labs:   Basic Metabolic Panel: Recent Labs  Lab 10/29/20 1630 10/29/20 2040 10/29/20 2327 10/30/20 0410 10/31/20 0537  NA 137  --   --  136 135  K 4.0  --   --  4.1 3.9  CL 98  --   --  101 99  CO2 24  --   --  27 28  GLUCOSE 322*  --   --  299* 223*  BUN 16  --   --  13 9  CREATININE 0.77  --  0.69 0.74 0.66  CALCIUM 10.4*  --   --  9.4 9.1  MG  --  1.7  --   --  1.5*  PHOS  --   --   --   --  3.9    GFR Estimated Creatinine Clearance: 139.6  mL/min (by C-G formula based on SCr of 0.66 mg/dL). Liver Function Tests: Recent Labs  Lab 10/29/20 1630 10/30/20 0410  AST 35 27  ALT 41 31  ALKPHOS 95 74  BILITOT 0.7 0.7  PROT 8.5* 6.9  ALBUMIN 4.3 3.4*   Recent Labs  Lab 10/29/20 1630  LIPASE 33   No results for input(s): AMMONIA in the last 168 hours. Coagulation profile No results for input(s): INR, PROTIME in the last 168 hours.  CBC: Recent Labs  Lab 10/29/20 1630 10/29/20 2327 10/30/20 0410 10/31/20 0537  WBC 9.7 7.9 8.1 7.2  NEUTROABS 6.2  --   --  4.9  HGB 17.9* 14.5 15.0 14.3  HCT 50.9 41.0 43.4 41.0  MCV 89.6 90.7 91.8 90.5  PLT 233 153 192 152   Cardiac Enzymes: No results for input(s): CKTOTAL, CKMB, CKMBINDEX, TROPONINI in the last 168 hours. BNP: Invalid input(s): POCBNP CBG: Recent Labs  Lab 10/30/20 1220 10/30/20 1705 10/30/20 2130 10/31/20 0727 10/31/20 1135  GLUCAP 242* 185* 196* 227* 231*   D-Dimer No results for input(s): DDIMER in the last 72 hours. Hgb A1c Recent Labs    10/29/20 1630  HGBA1C 8.8*   Lipid Profile No results for input(s): CHOL, HDL, LDLCALC, TRIG, CHOLHDL, LDLDIRECT in the last 72 hours. Thyroid function studies Recent Labs    10/29/20 2042  TSH 1.212   Anemia work up No results for input(s): VITAMINB12, FOLATE, FERRITIN, TIBC, IRON, RETICCTPCT in the last 72 hours. Microbiology Recent Results (from the past 240 hour(s))  Resp Panel by RT-PCR (Flu A&B, Covid) Nasopharyngeal Swab     Status: None   Collection Time: 10/30/20  4:13 AM   Specimen: Nasopharyngeal Swab; Nasopharyngeal(NP) swabs in vial transport medium  Result Value Ref Range Status   SARS Coronavirus 2 by RT PCR NEGATIVE NEGATIVE Final    Comment: (NOTE) SARS-CoV-2 target nucleic acids are NOT DETECTED.  The SARS-CoV-2 RNA is generally detectable in upper respiratory specimens during the acute phase of infection. The lowest concentration  of SARS-CoV-2 viral copies this assay can detect is 138 copies/mL. A negative result does not preclude SARS-Cov-2 infection and should not be used as the sole basis for treatment or other patient management decisions. A negative result may occur with  improper specimen collection/handling, submission of specimen other than nasopharyngeal swab, presence of viral mutation(s) within the areas targeted by this assay, and inadequate number of viral copies(<138 copies/mL). A negative result must be combined with clinical observations, patient history, and epidemiological information. The expected result is Negative.  Fact Sheet for Patients:  BloggerCourse.com  Fact Sheet for Healthcare Providers:  SeriousBroker.it  This test is no t yet approved or cleared by the [place] FDA and  has been authorized for detection and/or diagnosis of SARS-CoV-2 by FDA under an Emergency Use Authorization (EUA). This EUA will remain  in effect (meaning this test can be used) for the duration of the COVID-19 declaration under Section 564(b)(1) of the Act, 21 U.S.C.section 360bbb-3(b)(1), unless the authorization is terminated  or revoked sooner.       Influenza A by PCR NEGATIVE NEGATIVE Final   Influenza B by PCR NEGATIVE NEGATIVE Final    Comment: (NOTE) The Xpert Xpress SARS-CoV-2/FLU/RSV plus assay is intended as an aid in the diagnosis of influenza from Nasopharyngeal swab specimens and should not be used as a sole basis for treatment. Nasal washings and aspirates are unacceptable for Xpert Xpress SARS-CoV-2/FLU/RSV testing.  Fact Sheet for Patients: BloggerCourse.com  Fact Sheet for Healthcare Providers: SeriousBroker.ithttps://www.fda.gov/media/152162/download  This test is not yet approved or cleared by the [place] FDA and has been authorized for detection and/or diagnosis of SARS-CoV-2 by FDA under an Emergency Use  Authorization (EUA). This EUA will remain in effect (meaning this test can be used) for the duration of the COVID-19 declaration under Section 564(b)(1) of the Act, 21 U.S.C. section 360bbb-3(b)(1), unless the authorization is terminated or revoked.  Performed at [facility], [address] [place], [place] [id number]      Signed: Melina SchoolsBinaya Ariyan Brisendine  [facility] 10/31/2020, 11:50 AM

## 2021-12-17 ENCOUNTER — Encounter (HOSPITAL_COMMUNITY): Payer: Self-pay

## 2021-12-17 ENCOUNTER — Other Ambulatory Visit: Payer: Self-pay

## 2021-12-17 ENCOUNTER — Emergency Department (HOSPITAL_COMMUNITY): Payer: Self-pay

## 2021-12-17 ENCOUNTER — Emergency Department (HOSPITAL_COMMUNITY)
Admission: EM | Admit: 2021-12-17 | Discharge: 2021-12-17 | Disposition: A | Discharge status: Home / Self Care | Payer: Self-pay | Attending: Emergency Medicine | Admitting: Emergency Medicine

## 2021-12-17 DIAGNOSIS — Z79899 Other long term (current) drug therapy: Secondary | ICD-10-CM | POA: Insufficient documentation

## 2021-12-17 DIAGNOSIS — I10 Essential (primary) hypertension: Secondary | ICD-10-CM | POA: Insufficient documentation

## 2021-12-17 DIAGNOSIS — M79601 Pain in right arm: Secondary | ICD-10-CM | POA: Insufficient documentation

## 2021-12-17 DIAGNOSIS — Z7984 Long term (current) use of oral hypoglycemic drugs: Secondary | ICD-10-CM | POA: Insufficient documentation

## 2021-12-17 DIAGNOSIS — M67823 Other specified disorders of tendon, right elbow: Secondary | ICD-10-CM | POA: Insufficient documentation

## 2021-12-17 DIAGNOSIS — Z760 Encounter for issue of repeat prescription: Secondary | ICD-10-CM | POA: Insufficient documentation

## 2021-12-17 DIAGNOSIS — E119 Type 2 diabetes mellitus without complications: Secondary | ICD-10-CM | POA: Insufficient documentation

## 2021-12-17 DIAGNOSIS — M778 Other enthesopathies, not elsewhere classified: Secondary | ICD-10-CM

## 2021-12-17 MED ORDER — AMLODIPINE BESYLATE 10 MG PO TABS: 10.0000 mg | ORAL_TABLET | Freq: Every day | ORAL | 0 refills | Status: DC

## 2021-12-17 MED ORDER — METOPROLOL TARTRATE 25 MG PO TABS: 25.0000 mg | ORAL_TABLET | Freq: Two times a day (BID) | ORAL | 2 refills | Status: DC

## 2021-12-17 MED ORDER — CLONIDINE HCL 0.2 MG PO TABS: 0.2000 mg | ORAL_TABLET | Freq: Two times a day (BID) | ORAL | 0 refills | Status: DC

## 2021-12-17 NOTE — Discharge Instructions (Addendum)
You were seen in the emergency department for right arm pain.   As we discussed, I think your pain is likely related to tendonitis or muscle strain in the arm. Your x-ray didn't show any bony abnormalities.   Please use Tylenol or ibuprofen for pain.  You may use 800 mg ibuprofen every 6 hours or 1000 mg of Tylenol every 6 hours.  You may choose to alternate between the two, this would be most effective. Do not exceed 4000 mg of Tylenol within 24 hours.  Do not exceed 3200 mg ibuprofen within 24 hours.  You also could wear a soft brace on your elbow from the drug store.  I've refilled your blood pressure medication, but please follow up with your primary doctor to discuss restarting your depression medication.   Continue to monitor how you're doing and return to the ER for new or worsening symptoms.

## 2021-12-17 NOTE — ED Provider Notes (Signed)
[facility] DEPT Provider Note   CSN: [id number] Arrival date & time: 12/17/21  0915     History  Chief Complaint  Patient presents with   Arm Pain    Peter [name] is a 41 y.o. male with history of pancreatitis, polysubstance abuse, HTN, alcoholic hepatitis, and type 2 diabetes who presents to the emergency department complaining of right arm pain. Patient states that his right arm and elbow have been bothering him after work for the past 3 weeks or so. 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 He describes it as feeling sore. Localizes the pain to just above and just below his right elbow along with pain along the outside of his elbow. He is also interested in restarting his hypertension and depression medication today. States he is currently taking metoprolol, but is also supposed to be on clonidine and amlodipine. Reports being on buspirone and effexor for depression.    Arm Pain       Home Medications Prior to Admission medications   Medication Sig Start Date End Date Taking? Authorizing Provider  amLODipine (NORVASC) 10 MG tablet Take 1 tablet (10 mg total) by mouth daily. 12/17/21 01/16/22  Therron Sells T, PA-C  cloNIDine (CATAPRES) 0.2 MG tablet Take 1 tablet (0.2 mg total) by mouth 2 (two) times daily. 12/17/21 01/16/22  David Rodriquez T, PA-C  glipiZIDE (GLUCOTROL) 5 MG tablet Take 1 tablet (5 mg total) by mouth 2 (two) times daily before a meal. 10/31/20 01/29/21  Dahal, Marlowe Aschoff, MD  losartan (COZAAR) 25 MG tablet Take 1 tablet (25 mg total) by mouth daily. 10/31/20 01/29/21  Terrilee Croak, MD  metFORMIN (GLUCOPHAGE) 1000 MG tablet Take 1 tablet (1,000 mg total) by mouth 2 (two) times daily with a meal. 10/31/20 01/29/21  Dahal, Marlowe Aschoff, MD  metoprolol tartrate (LOPRESSOR) 25 MG tablet Take 1 tablet (25 mg total) by mouth 2 (two) times daily. 12/17/21 03/17/22  Camdin Hegner T, PA-C  simvastatin (ZOCOR) 20  MG tablet Take 1 tablet (20 mg total) by mouth at bedtime. 10/31/20 01/29/21  Terrilee Croak, MD  venlafaxine XR (EFFEXOR-XR) 75 MG 24 hr capsule Take 1 capsule (75 mg total) by mouth daily with breakfast. 10/31/20 11/30/20  Terrilee Croak, MD      Allergies    Patient has no known allergies.    Review of Systems   Review of Systems  Constitutional:  Negative for fever.  Musculoskeletal:  Positive for arthralgias and myalgias. Negative for joint swelling.  All other systems reviewed and are negative.   Physical Exam Updated Vital Signs BP (!) 162/132 (BP Location: Right Arm)   Pulse 93   Temp 98.4 F (36.9 C) (Oral)   Resp 16   Ht 5\' 10"  (1.778 m)   Wt 83.9 kg   SpO2 96%   BMI 26.54 kg/m  Physical Exam Vitals and nursing note reviewed.  Constitutional:      Appearance: Normal appearance.  HENT:     Head: Normocephalic and atraumatic.  Eyes:     Conjunctiva/sclera: Conjunctivae normal.  Cardiovascular:     Pulses:          Radial pulses are 2+ on the right side and 2+ on the left side.  Pulmonary:     Effort: Pulmonary effort is normal. No respiratory distress.  Musculoskeletal:     Comments: Mild tenderness to palpation of muscles of the forearm and right upper arm, no bony tenderness or deformity of  the right elbow. Normal grip strength and bicep flexion/extension. Normal sensation  Skin:    General: Skin is warm and dry.  Neurological:     Mental Status: He is alert.  Psychiatric:        Mood and Affect: Mood normal.        Behavior: Behavior normal.     ED Results / Procedures / Treatments   Labs (all labs ordered are listed, but only abnormal results are displayed) Labs Reviewed - No data to display  EKG None  Radiology DG Elbow Complete Right  Result Date: 12/17/2021 CLINICAL DATA:  Posterolateral right elbow pain for 3 weeks EXAM: RIGHT ELBOW - COMPLETE 3+ VIEW COMPARISON:  None Available. FINDINGS: There is no evidence of fracture, dislocation, or  joint effusion. There is no evidence of arthropathy or other focal bone abnormality. Soft tissues are unremarkable. IMPRESSION: Negative. Electronically Signed   By: Van Clines M.D.   On: 12/17/2021 12:38    Procedures Procedures    Medications Ordered in ED Medications - No data to display  ED Course/ Medical Decision Making/ A&P                           Medical Decision Making Amount and/or Complexity of Data Reviewed Radiology: ordered.  Risk Prescription drug management.  This patient is a 41 y.o. male who presents to the ED for concern of right arm pain x 3 weeks.   Differential diagnoses prior to evaluation: Tendonitis, muscle strain, fracture/dislocation, septic joint, DVT  Past Medical History / Social History / Additional history: Chart reviewed. Pertinent results include: pancreatitis, polysubstance abuse, HTN, alcoholic hepatitis, and type 2 diabetes  Physical Exam: Physical exam performed. The pertinent findings include: Mild tenderness to palpation of muscles of the forearm and right upper arm. No deformities. Neurovascularly intact in BUE.   Imaging: I personally ordered and interpreted the following images: x-ray of right elbow which show no acute findings  Disposition: After consideration of the diagnostic results and the patients response to treatment, I feel that emergency department workup does not suggest an emergent condition requiring admission or immediate intervention beyond what has been performed at this time. The plan is: discharge to home with recommendation of RICE method for likely right elbow tendonitis. Will refill blood pressure medications but told patient I would feel more comfortable with his PCP refilling his depression medication so he has better follow up. The patient is safe for discharge and has been instructed to return immediately for worsening symptoms, change in symptoms or any other concerns.   Final Clinical Impression(s) /  ED Diagnoses Final diagnoses:  Right arm pain  Medication refill  Right elbow tendonitis    Rx / DC Orders ED Discharge Orders          Ordered    amLODipine (NORVASC) 10 MG tablet  Daily        12/17/21 1301    cloNIDine (CATAPRES) 0.2 MG tablet  2 times daily        12/17/21 1301    metoprolol tartrate (LOPRESSOR) 25 MG tablet  2 times daily        12/17/21 1301           Portions of this report may have been transcribed using voice recognition software. Every effort was made to ensure accuracy; however, inadvertent computerized transcription errors may be present.    Kateri Plummer, PA-C 12/17/21 1317    Steinl,  Lennette Bihari, MD 12/20/21 [phone]

## 2021-12-17 NOTE — ED Triage Notes (Signed)
Patient stated that his right upper arm and elbow have been hurting for 3 weeks. 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 He said it feels sore after work.

## 2022-09-12 ENCOUNTER — Emergency Department (HOSPITAL_COMMUNITY): Payer: Self-pay

## 2022-09-12 ENCOUNTER — Inpatient Hospital Stay (HOSPITAL_COMMUNITY)
Admission: EM | Admit: 2022-09-12 | Discharge: 2022-09-21 | DRG: 439 | Disposition: A | Discharge status: Home / Self Care | Payer: Self-pay | Attending: Family Medicine | Admitting: Family Medicine

## 2022-09-12 ENCOUNTER — Other Ambulatory Visit: Payer: Self-pay

## 2022-09-12 DIAGNOSIS — K709 Alcoholic liver disease, unspecified: Secondary | ICD-10-CM | POA: Diagnosis present

## 2022-09-12 DIAGNOSIS — I82890 Acute embolism and thrombosis of other specified veins: Secondary | ICD-10-CM | POA: Diagnosis present

## 2022-09-12 DIAGNOSIS — K760 Fatty (change of) liver, not elsewhere classified: Secondary | ICD-10-CM | POA: Diagnosis present

## 2022-09-12 DIAGNOSIS — I10 Essential (primary) hypertension: Secondary | ICD-10-CM | POA: Diagnosis present

## 2022-09-12 DIAGNOSIS — Z8249 Family history of ischemic heart disease and other diseases of the circulatory system: Secondary | ICD-10-CM

## 2022-09-12 DIAGNOSIS — E872 Acidosis, unspecified: Secondary | ICD-10-CM | POA: Diagnosis present

## 2022-09-12 DIAGNOSIS — K828 Other specified diseases of gallbladder: Secondary | ICD-10-CM | POA: Diagnosis present

## 2022-09-12 DIAGNOSIS — Z597 Insufficient social insurance and welfare support: Secondary | ICD-10-CM

## 2022-09-12 DIAGNOSIS — Z7901 Long term (current) use of anticoagulants: Secondary | ICD-10-CM

## 2022-09-12 DIAGNOSIS — D696 Thrombocytopenia, unspecified: Secondary | ICD-10-CM | POA: Diagnosis present

## 2022-09-12 DIAGNOSIS — K8689 Other specified diseases of pancreas: Secondary | ICD-10-CM | POA: Diagnosis present

## 2022-09-12 DIAGNOSIS — E871 Hypo-osmolality and hyponatremia: Secondary | ICD-10-CM | POA: Diagnosis present

## 2022-09-12 DIAGNOSIS — R748 Abnormal levels of other serum enzymes: Secondary | ICD-10-CM | POA: Diagnosis present

## 2022-09-12 DIAGNOSIS — Z79899 Other long term (current) drug therapy: Secondary | ICD-10-CM

## 2022-09-12 DIAGNOSIS — K8071 Calculus of gallbladder and bile duct without cholecystitis with obstruction: Secondary | ICD-10-CM | POA: Diagnosis present

## 2022-09-12 DIAGNOSIS — F1010 Alcohol abuse, uncomplicated: Secondary | ICD-10-CM | POA: Diagnosis present

## 2022-09-12 DIAGNOSIS — Z1152 Encounter for screening for COVID-19: Secondary | ICD-10-CM

## 2022-09-12 DIAGNOSIS — F17210 Nicotine dependence, cigarettes, uncomplicated: Secondary | ICD-10-CM | POA: Diagnosis present

## 2022-09-12 DIAGNOSIS — E861 Hypovolemia: Secondary | ICD-10-CM | POA: Diagnosis present

## 2022-09-12 DIAGNOSIS — K859 Acute pancreatitis without necrosis or infection, unspecified: Secondary | ICD-10-CM | POA: Diagnosis present

## 2022-09-12 DIAGNOSIS — K3189 Other diseases of stomach and duodenum: Secondary | ICD-10-CM | POA: Diagnosis present

## 2022-09-12 DIAGNOSIS — K759 Inflammatory liver disease, unspecified: Principal | ICD-10-CM

## 2022-09-12 DIAGNOSIS — E1165 Type 2 diabetes mellitus with hyperglycemia: Secondary | ICD-10-CM | POA: Diagnosis present

## 2022-09-12 DIAGNOSIS — R7303 Prediabetes: Secondary | ICD-10-CM

## 2022-09-12 DIAGNOSIS — Z83438 Family history of other disorder of lipoprotein metabolism and other lipidemia: Secondary | ICD-10-CM

## 2022-09-12 DIAGNOSIS — E782 Mixed hyperlipidemia: Secondary | ICD-10-CM

## 2022-09-12 DIAGNOSIS — K852 Alcohol induced acute pancreatitis without necrosis or infection: Principal | ICD-10-CM | POA: Diagnosis present

## 2022-09-12 DIAGNOSIS — K8050 Calculus of bile duct without cholangitis or cholecystitis without obstruction: Secondary | ICD-10-CM | POA: Diagnosis present

## 2022-09-12 DIAGNOSIS — Z823 Family history of stroke: Secondary | ICD-10-CM

## 2022-09-12 DIAGNOSIS — Z7984 Long term (current) use of oral hypoglycemic drugs: Secondary | ICD-10-CM

## 2022-09-12 LAB — BLOOD GAS, VENOUS
Acid-Base Excess: 5.2 mmol/L — ABNORMAL HIGH (ref 0.0–2.0)
Bicarbonate: 29 mmol/L — ABNORMAL HIGH (ref 20.0–28.0)
Drawn by: 68335
O2 Saturation: 92.8 %
Patient temperature: 37
pCO2, Ven: 39 mmHg — ABNORMAL LOW (ref 44–60)
pH, Ven: 7.48 — ABNORMAL HIGH (ref 7.25–7.43)
pO2, Ven: 58 mmHg — ABNORMAL HIGH (ref 32–45)

## 2022-09-12 LAB — ACETAMINOPHEN LEVEL: Acetaminophen (Tylenol), Serum: 19 ug/mL (ref 10–30)

## 2022-09-12 LAB — BILIRUBIN, DIRECT: Bilirubin, Direct: 2.9 mg/dL — ABNORMAL HIGH (ref 0.0–0.2)

## 2022-09-12 LAB — COMPREHENSIVE METABOLIC PANEL
ALT: 480 U/L — ABNORMAL HIGH (ref 0–44)
AST: 675 U/L — ABNORMAL HIGH (ref 15–41)
Albumin: 3.8 g/dL (ref 3.5–5.0)
Alkaline Phosphatase: 342 U/L — ABNORMAL HIGH (ref 38–126)
Anion gap: 16 — ABNORMAL HIGH (ref 5–15)
BUN: 7 mg/dL (ref 6–20)
CO2: 22 mmol/L (ref 22–32)
Calcium: 9.3 mg/dL (ref 8.9–10.3)
Chloride: 92 mmol/L — ABNORMAL LOW (ref 98–111)
Creatinine, Ser: 0.78 mg/dL (ref 0.61–1.24)
GFR, Estimated: >60 mL/min (ref >60)
Glucose, Bld: 445 mg/dL — ABNORMAL HIGH (ref 70–99)
Potassium: 3.7 mmol/L (ref 3.5–5.1)
Sodium: 130 mmol/L — ABNORMAL LOW (ref 135–145)
Total Bilirubin: 5.3 mg/dL — ABNORMAL HIGH (ref 0.3–1.2)
Total Protein: 7.9 g/dL (ref 6.5–8.1)

## 2022-09-12 LAB — CBC WITH DIFFERENTIAL/PLATELET
Abs Immature Granulocytes: 0.04 10*3/uL (ref 0.00–0.07)
Basophils Absolute: 0 10*3/uL (ref 0.0–0.1)
Basophils Relative: 0 %
Eosinophils Absolute: 0 10*3/uL (ref 0.0–0.5)
Eosinophils Relative: 0 %
HCT: 45.9 % (ref 39.0–52.0)
Hemoglobin: 16.8 g/dL (ref 13.0–17.0)
Immature Granulocytes: 0 %
Lymphocytes Relative: 9 %
Lymphs Abs: 0.8 10*3/uL (ref 0.7–4.0)
MCH: 34.6 pg — ABNORMAL HIGH (ref 26.0–34.0)
MCHC: 36.6 g/dL — ABNORMAL HIGH (ref 30.0–36.0)
MCV: 94.6 fL (ref 80.0–100.0)
Monocytes Absolute: 0.5 10*3/uL (ref 0.1–1.0)
Monocytes Relative: 6 %
Neutro Abs: 7.6 10*3/uL (ref 1.7–7.7)
Neutrophils Relative %: 85 %
Platelets: 165 10*3/uL (ref 150–400)
RBC: 4.85 MIL/uL (ref 4.22–5.81)
RDW: 12.3 % (ref 11.5–15.5)
WBC: 9.1 10*3/uL (ref 4.0–10.5)
nRBC: 0 % (ref 0.0–0.2)

## 2022-09-12 LAB — TROPONIN I (HIGH SENSITIVITY)
Troponin I (High Sensitivity): 4 ng/L (ref <18)
Troponin I (High Sensitivity): 4 ng/L (ref <18)

## 2022-09-12 LAB — GLUCOSE, CAPILLARY: Glucose-Capillary: 287 mg/dL — ABNORMAL HIGH (ref 70–99)

## 2022-09-12 LAB — RESP PANEL BY RT-PCR (RSV, FLU A&B, COVID)  RVPGX2
Influenza A by PCR: NEGATIVE
Influenza B by PCR: NEGATIVE
Resp Syncytial Virus by PCR: NEGATIVE
SARS Coronavirus 2 by RT PCR: NEGATIVE

## 2022-09-12 LAB — HEPATITIS PANEL, ACUTE
HCV Ab: NONREACTIVE
Hep A IgM: NONREACTIVE
Hep B C IgM: NONREACTIVE
Hepatitis B Surface Ag: NONREACTIVE

## 2022-09-12 LAB — PROTIME-INR
INR: 1 (ref 0.8–1.2)
Prothrombin Time: 13.7 seconds (ref 11.4–15.2)

## 2022-09-12 LAB — SALICYLATE LEVEL: Salicylate Lvl: <7 mg/dL — ABNORMAL LOW (ref 7.0–30.0)

## 2022-09-12 LAB — D-DIMER, QUANTITATIVE: D-Dimer, Quant: 0.71 ug/mL-FEU — ABNORMAL HIGH (ref 0.00–0.50)

## 2022-09-12 LAB — MRSA NEXT GEN BY PCR, NASAL: MRSA by PCR Next Gen: NOT DETECTED

## 2022-09-12 LAB — LACTIC ACID, PLASMA: Lactic Acid, Venous: 1.5 mmol/L (ref 0.5–1.9)

## 2022-09-12 MED ORDER — THIAMINE HCL 100 MG/ML IJ SOLN
100.0000 mg | Freq: Every day | INTRAMUSCULAR | Status: DC
  Administered 2022-09-21: 100 mg via INTRAVENOUS
  Filled 2022-09-12 (×2): qty 2

## 2022-09-12 MED ORDER — HYDRALAZINE HCL 20 MG/ML IJ SOLN: 10.0000 mg | Freq: Four times a day (QID) | INTRAMUSCULAR | Status: DC | PRN

## 2022-09-12 MED ORDER — THIAMINE MONONITRATE 100 MG PO TABS
100.0000 mg | ORAL_TABLET | Freq: Every day | ORAL | Status: DC
  Administered 2022-09-12 – 2022-09-20 (×9): 100 mg via ORAL
  Filled 2022-09-12 (×11): qty 1

## 2022-09-12 MED ORDER — LORAZEPAM 1 MG PO TABS: 1.0000 mg | ORAL_TABLET | ORAL | Status: AC | PRN

## 2022-09-12 MED ORDER — FOLIC ACID 1 MG PO TABS
1.0000 mg | ORAL_TABLET | Freq: Every day | ORAL | Status: DC
  Administered 2022-09-12 – 2022-09-21 (×10): 1 mg via ORAL
  Filled 2022-09-12 (×10): qty 1

## 2022-09-12 MED ORDER — TRAZODONE HCL 50 MG PO TABS: 25.0000 mg | ORAL_TABLET | Freq: Every evening | ORAL | Status: DC | PRN

## 2022-09-12 MED ORDER — IBUPROFEN 200 MG PO TABS
400.0000 mg | ORAL_TABLET | Freq: Four times a day (QID) | ORAL | Status: DC | PRN
  Administered 2022-09-13: 400 mg via ORAL
  Filled 2022-09-12: qty 2

## 2022-09-12 MED ORDER — AMLODIPINE BESYLATE 10 MG PO TABS
10.0000 mg | ORAL_TABLET | Freq: Every day | ORAL | Status: DC
  Administered 2022-09-12 – 2022-09-21 (×10): 10 mg via ORAL
  Filled 2022-09-12 (×10): qty 1

## 2022-09-12 MED ORDER — ADULT MULTIVITAMIN W/MINERALS CH
1.0000 | ORAL_TABLET | Freq: Every day | ORAL | Status: DC
  Administered 2022-09-12 – 2022-09-21 (×10): 1 via ORAL
  Filled 2022-09-12 (×10): qty 1

## 2022-09-12 MED ORDER — INSULIN ASPART 100 UNIT/ML IJ SOLN
0.0000 [IU] | Freq: Every day | INTRAMUSCULAR | Status: DC
  Administered 2022-09-12: 3 [IU] via SUBCUTANEOUS
  Filled 2022-09-12: qty 0.05

## 2022-09-12 MED ORDER — ONDANSETRON HCL 4 MG/2ML IJ SOLN: 4.0000 mg | Freq: Four times a day (QID) | INTRAMUSCULAR | Status: DC | PRN

## 2022-09-12 MED ORDER — CHLORDIAZEPOXIDE HCL 25 MG PO CAPS
25.0000 mg | ORAL_CAPSULE | Freq: Every day | ORAL | Status: AC
  Administered 2022-09-16: 25 mg via ORAL
  Filled 2022-09-12: qty 1

## 2022-09-12 MED ORDER — CHLORDIAZEPOXIDE HCL 25 MG PO CAPS
25.0000 mg | ORAL_CAPSULE | ORAL | Status: AC
  Administered 2022-09-15 (×2): 25 mg via ORAL
  Filled 2022-09-12 (×2): qty 1

## 2022-09-12 MED ORDER — HYDROXYZINE HCL 25 MG PO TABS
25.0000 mg | ORAL_TABLET | Freq: Four times a day (QID) | ORAL | Status: AC | PRN
  Administered 2022-09-13 – 2022-09-14 (×2): 25 mg via ORAL
  Filled 2022-09-12 (×2): qty 1

## 2022-09-12 MED ORDER — LACTATED RINGERS IV BOLUS
1000.0000 mL | Freq: Once | INTRAVENOUS | Status: AC
  Administered 2022-09-12: 1000 mL via INTRAVENOUS

## 2022-09-12 MED ORDER — LOPERAMIDE HCL 2 MG PO CAPS: 2.0000 mg | ORAL_CAPSULE | ORAL | Status: AC | PRN

## 2022-09-12 MED ORDER — INSULIN ASPART 100 UNIT/ML IJ SOLN
0.0000 [IU] | Freq: Three times a day (TID) | INTRAMUSCULAR | Status: DC
  Filled 2022-09-12: qty 0.15

## 2022-09-12 MED ORDER — CLONIDINE HCL 0.2 MG PO TABS
0.2000 mg | ORAL_TABLET | Freq: Two times a day (BID) | ORAL | Status: DC
  Administered 2022-09-12 – 2022-09-21 (×18): 0.2 mg via ORAL
  Filled 2022-09-12: qty 2
  Filled 2022-09-12 (×12): qty 1
  Filled 2022-09-12: qty 2
  Filled 2022-09-12 (×4): qty 1

## 2022-09-12 MED ORDER — IOHEXOL 350 MG/ML SOLN
75.0000 mL | Freq: Once | INTRAVENOUS | Status: AC | PRN
  Administered 2022-09-12: 75 mL via INTRAVENOUS

## 2022-09-12 MED ORDER — ENOXAPARIN SODIUM 40 MG/0.4ML IJ SOSY
40.0000 mg | PREFILLED_SYRINGE | INTRAMUSCULAR | Status: DC
  Administered 2022-09-12 – 2022-09-14 (×3): 40 mg via SUBCUTANEOUS
  Filled 2022-09-12 (×3): qty 0.4

## 2022-09-12 MED ORDER — ONDANSETRON HCL 4 MG PO TABS: 4.0000 mg | ORAL_TABLET | Freq: Four times a day (QID) | ORAL | Status: DC | PRN

## 2022-09-12 MED ORDER — CHLORDIAZEPOXIDE HCL 25 MG PO CAPS
25.0000 mg | ORAL_CAPSULE | Freq: Three times a day (TID) | ORAL | Status: AC
  Administered 2022-09-14 (×3): 25 mg via ORAL
  Filled 2022-09-12 (×3): qty 1

## 2022-09-12 MED ORDER — CHLORDIAZEPOXIDE HCL 25 MG PO CAPS
25.0000 mg | ORAL_CAPSULE | Freq: Four times a day (QID) | ORAL | Status: AC
  Administered 2022-09-12 – 2022-09-13 (×6): 25 mg via ORAL
  Filled 2022-09-12 (×6): qty 1

## 2022-09-12 MED ORDER — CHLORHEXIDINE GLUCONATE CLOTH 2 % EX PADS
6.0000 | MEDICATED_PAD | Freq: Every day | CUTANEOUS | Status: DC
  Administered 2022-09-12: 6 via TOPICAL

## 2022-09-12 MED ORDER — SODIUM CHLORIDE (PF) 0.9 % IJ SOLN
INTRAMUSCULAR | Status: AC
  Filled 2022-09-12: qty 50

## 2022-09-12 NOTE — ED Triage Notes (Signed)
Pt reports hx of Htn, being on amlodipine, but out of clonidine and metoprolol x2 months. Also concerned for covid with reports of everything tasting and smelling the same. Presents tachycardic. Denies CP, SOB, palpitations.

## 2022-09-12 NOTE — ED Notes (Signed)
ED TO INPATIENT HANDOFF REPORT  ED Nurse Name and Phone #:   Mellody Dance  [phone]  S Name/Age/Gender Peter [name] 42 y.o. male Room/Bed: WA08/WA08  Code Status   Code Status: Full Code  Home/SNF/Other Home Patient oriented to: self, place, time, and situation Is this baseline? Yes   Triage Complete: Triage complete  Chief Complaint Hepatitis [K75.9]  Triage Note Pt reports hx of Htn, being on amlodipine, but out of clonidine and metoprolol x2 months. Also concerned for covid with reports of everything tasting and smelling the same. Presents tachycardic. Denies CP, SOB, palpitations.   Allergies No Known Allergies  Level of Care/Admitting Diagnosis ED Disposition     ED Disposition  Admit   Condition  --   Comment  Hospital Area: [facility] [[id number]]  Level of Care: Stepdown [14]  Admit to SDU based on following criteria: Severe physiological/psychological symptoms:  Any diagnosis requiring assessment & intervention at least every 4 hours on an ongoing basis to obtain desired patient outcomes including stability and rehabilitation  May place patient in observation at [facility] or [facility] if equivalent level of care is available:: Yes  Covid Evaluation: Asymptomatic - no recent exposure (last 10 days) testing not required  Diagnosis: Hepatitis [242201]  Admitting Physician: Maryln Gottron [[id number]]  Attending Physician: Kirby Crigler, MIR [email or web address] [[id number]]          B Medical/Surgery History Past Medical History:  Diagnosis Date   Alcoholism (HCC)    DKA (diabetic ketoacidosis) (HCC) 04/04/2020   Hypertension    Substance abuse (HCC)    Past Surgical History:  Procedure Laterality Date   DENTAL SURGERY     TIBIA IM NAIL INSERTION Right 01/22/2018   Procedure: RIGHT INTRAMEDULLARY (IM) NAIL TIBIAL;  Surgeon: Sheral Apley, MD;  Location: [facility] OR;  Service: Orthopedics;  Laterality: Right;     A IV Location/Drains/Wounds Patient  Lines/Drains/Airways Status     Active Line/Drains/Airways     Name Placement date Placement time Site Days   Peripheral IV 09/12/22 20 G Left Antecubital 09/12/22  1348  Antecubital  less than 1   Incision (Closed) 01/22/18 Leg Right 01/22/18  1559  -- 1694            Intake/Output Last 24 hours  Intake/Output Summary (Last 24 hours) at 09/12/2022 1809 Last data filed at 09/12/2022 1530 Gross per 24 hour  Intake 1000 ml  Output --  Net 1000 ml    Labs/Imaging Results for orders placed or performed during the hospital encounter of 09/12/22 (from the past 48 hour(s))  Resp panel by RT-PCR (RSV, Flu A&B, Covid) Anterior Nasal Swab     Status: None   Collection Time: 09/12/22  1:24 PM   Specimen: Anterior Nasal Swab  Result Value Ref Range   SARS Coronavirus 2 by RT PCR NEGATIVE NEGATIVE    Comment: (NOTE) SARS-CoV-2 target nucleic acids are NOT DETECTED.  The SARS-CoV-2 RNA is generally detectable in upper respiratory specimens during the acute phase of infection. The lowest concentration of SARS-CoV-2 viral copies this assay can detect is 138 copies/mL. A negative result does not preclude SARS-Cov-2 infection and should not be used as the sole basis for treatment or other patient management decisions. A negative result may occur with  improper specimen collection/handling, submission of specimen other than nasopharyngeal swab, presence of viral mutation(s) within the areas targeted by this assay, and inadequate number of viral copies(<138 copies/mL). A negative result must be  combined with clinical observations, patient history, and epidemiological information. The expected result is Negative.  Fact Sheet for Patients:  BloggerCourse.com  Fact Sheet for Healthcare Providers:  SeriousBroker.it  This test is no t yet approved or cleared by the [place] FDA and  has been authorized for detection and/or diagnosis of  SARS-CoV-2 by FDA under an Emergency Use Authorization (EUA). This EUA will remain  in effect (meaning this test can be used) for the duration of the COVID-19 declaration under Section 564(b)(1) of the Act, 21 U.S.C.section 360bbb-3(b)(1), unless the authorization is terminated  or revoked sooner.       Influenza A by PCR NEGATIVE NEGATIVE   Influenza B by PCR NEGATIVE NEGATIVE    Comment: (NOTE) The Xpert Xpress SARS-CoV-2/FLU/RSV plus assay is intended as an aid in the diagnosis of influenza from Nasopharyngeal swab specimens and should not be used as a sole basis for treatment. Nasal washings and aspirates are unacceptable for Xpert Xpress SARS-CoV-2/FLU/RSV testing.  Fact Sheet for Patients: BloggerCourse.com  Fact Sheet for Healthcare Providers: SeriousBroker.it  This test is not yet approved or cleared by the [place] FDA and has been authorized for detection and/or diagnosis of SARS-CoV-2 by FDA under an Emergency Use Authorization (EUA). This EUA will remain in effect (meaning this test can be used) for the duration of the COVID-19 declaration under Section 564(b)(1) of the Act, 21 U.S.C. section 360bbb-3(b)(1), unless the authorization is terminated or revoked.     Resp Syncytial Virus by PCR NEGATIVE NEGATIVE    Comment: (NOTE) Fact Sheet for Patients: BloggerCourse.com  Fact Sheet for Healthcare Providers: SeriousBroker.it  This test is not yet approved or cleared by the [place] FDA and has been authorized for detection and/or diagnosis of SARS-CoV-2 by FDA under an Emergency Use Authorization (EUA). This EUA will remain in effect (meaning this test can be used) for the duration of the COVID-19 declaration under Section 564(b)(1) of the Act, 21 U.S.C. section 360bbb-3(b)(1), unless the authorization is terminated or revoked.  Performed at [facility], [address], [place], [place] [address]   CBC with Differential     Status: Abnormal   Collection Time: 09/12/22  1:41 PM  Result Value Ref Range   WBC 9.1 4.0 - 10.5 K/uL   RBC 4.85 4.22 - 5.81 MIL/uL   Hemoglobin 16.8 13.0 - 17.0 g/dL   HCT 60.4 54.0 - 98.1 %   MCV 94.6 80.0 - 100.0 fL   MCH 34.6 (H) 26.0 - 34.0 pg   MCHC 36.6 (H) 30.0 - 36.0 g/dL   RDW 19.1 47.8 - 29.5 %   Platelets 165 150 - 400 K/uL   nRBC 0.0 0.0 - 0.2 %   Neutrophils Relative % 85 %   Neutro Abs 7.6 1.7 - 7.7 K/uL   Lymphocytes Relative 9 %   Lymphs Abs 0.8 0.7 - 4.0 K/uL   Monocytes Relative 6 %   Monocytes Absolute 0.5 0.1 - 1.0 K/uL   Eosinophils Relative 0 %   Eosinophils Absolute 0.0 0.0 - 0.5 K/uL   Basophils Relative 0 %   Basophils Absolute 0.0 0.0 - 0.1 K/uL   Immature Granulocytes 0 %   Abs Immature Granulocytes 0.04 0.00 - 0.07 K/uL    Comment: Performed at [facility], [address] [place], [place] [address]  Comprehensive metabolic panel     Status: Abnormal   Collection Time: 09/12/22  1:41 PM  Result Value Ref Range  Sodium 130 (L) 135 - 145 mmol/L   Potassium 3.7 3.5 - 5.1 mmol/L   Chloride 92 (L) 98 - 111 mmol/L   CO2 22 22 - 32 mmol/L   Glucose, Bld 445 (H) 70 - 99 mg/dL    Comment: Glucose reference range applies only to samples taken after fasting for at least 8 hours.   BUN 7 6 - 20 mg/dL   Creatinine, Ser 1.61 0.61 - 1.24 mg/dL   Calcium 9.3 8.9 - 09.6 mg/dL   Total Protein 7.9 6.5 - 8.1 g/dL   Albumin 3.8 3.5 - 5.0 g/dL   AST 045 (H) 15 - 41 U/L   ALT 480 (H) 0 - 44 U/L   Alkaline Phosphatase 342 (H) 38 - 126 U/L   Total Bilirubin 5.3 (H) 0.3 - 1.2 mg/dL   GFR, Estimated >40 >98 mL/min    Comment: (NOTE) Calculated using the CKD-EPI Creatinine Equation (2021)    Anion gap 16 (H) 5 - 15    Comment: Performed at [facility], [address], [place], [place] [address]  Troponin I (High Sensitivity)      Status: None   Collection Time: 09/12/22  1:41 PM  Result Value Ref Range   Troponin I (High Sensitivity) 4 <18 ng/L    Comment: (NOTE) Elevated high sensitivity troponin I (hsTnI) values and significant  changes across serial measurements may suggest ACS but many other  chronic and acute conditions are known to elevate hsTnI results.  Refer to the "Links" section for chest pain algorithms and additional  guidance. Performed at [facility], [address] [place], [place] [address]   D-dimer, quantitative     Status: Abnormal   Collection Time: 09/12/22  1:41 PM  Result Value Ref Range   D-Dimer, Quant 0.71 (H) 0.00 - 0.50 ug/mL-FEU    Comment: (NOTE) At the manufacturer cut-off value of 0.5 g/mL FEU, this assay has a negative predictive value of 95-100%.This assay is intended for use in conjunction with a clinical pretest probability (PTP) assessment model to exclude pulmonary embolism (PE) and deep venous thrombosis (DVT) in outpatients suspected of PE or DVT. Results should be correlated with clinical presentation. Performed at [facility], [address] [place], [place] [address]   Troponin I (High Sensitivity)     Status: None   Collection Time: 09/12/22  3:29 PM  Result Value Ref Range   Troponin I (High Sensitivity) 4 <18 ng/L    Comment: (NOTE) Elevated high sensitivity troponin I (hsTnI) values and significant  changes across serial measurements may suggest ACS but many other  chronic and acute conditions are known to elevate hsTnI results.  Refer to the "Links" section for chest pain algorithms and additional  guidance. Performed at [facility], [address] [place], [place] [address]   Lactic acid, plasma     Status: None   Collection Time: 09/12/22  3:29 PM  Result Value Ref Range   Lactic Acid, Venous 1.5 0.5 - 1.9 mmol/L    Comment: Performed at [facility], [address] [place], [place] [address]  Protime-INR     Status: None   Collection Time: 09/12/22  3:29 PM  Result Value Ref Range   Prothrombin Time 13.7 11.4 - 15.2 seconds   INR 1.0 0.8 - 1.2    Comment: (NOTE) INR goal varies based on device and disease states. Performed at [facility], [address] [place], [place] [address]   Bilirubin, direct     Status: Abnormal   Collection Time: 09/12/22  3:29 PM  Result Value Ref Range   Bilirubin, Direct 2.9 (H) 0.0 - 0.2 mg/dL    Comment: Performed at [facility], [address], [place], [place] [address]  Blood gas, venous (at [facility])     Status: Abnormal   Collection Time: 09/12/22  3:45 PM  Result Value Ref Range   pH, Ven 7.48 (H) 7.25 - 7.43   pCO2, Ven 39 (L) 44 - 60 mmHg   pO2, Ven 58 (H) 32 - 45 mmHg   Bicarbonate 29.0 (H) 20.0 - 28.0 mmol/L   Acid-Base Excess 5.2 (H) 0.0 - 2.0 mmol/L   O2 Saturation 92.8 %   Patient temperature 37.0    Drawn by [id number]     Comment: Performed at [facility], [address], [place], [place] [address]  Acetaminophen level     Status: None   Collection Time: 09/12/22  4:40 PM  Result Value Ref Range   Acetaminophen (Tylenol), Serum 19 10 - 30 ug/mL    Comment: (NOTE) Therapeutic concentrations vary significantly. A range of 10-30 ug/mL  may be an effective concentration for many patients. However, some  are best treated at concentrations outside of this range. Acetaminophen concentrations >150 ug/mL at 4 hours after ingestion  and >50 ug/mL at 12 hours after ingestion are often associated with  toxic reactions.  Performed at [facility], [address] [place], [place] [address]   Salicylate level     Status: Abnormal   Collection Time: 09/12/22  4:40 PM  Result Value Ref Range   Salicylate Lvl <7.0 (L) 7.0 - 30.0 mg/dL    Comment: Performed at [facility], [address] [place], [place]  [address]   US Abdomen Limited RUQ (LIVER/GB)  Result Date: 09/12/2022 CLINICAL DATA:  Hyperbilirubinemia EXAM: ULTRASOUND ABDOMEN LIMITED RIGHT UPPER QUADRANT COMPARISON:  None Available. FINDINGS: Gallbladder: Distended gallbladder. Small stones are seen measuring up to 9 mm. Some sludge as well. No wall thickening or adjacent fluid. No reported sonographic Murphy's sign. Common bile duct: Diameter: 12 mm.  Abnormally enlarged. Liver: Mildly echogenic hepatic parenchyma consistent with fatty liver infiltration. Mild intrahepatic biliary ductal dilatation as well. Portal vein is patent on color Doppler imaging with normal direction of blood flow towards the liver. Other: None. IMPRESSION: Distended gallbladder with stones and some sludge. There is a dilated biliary tree. Etiology is uncertain. If there is no known history recommend further workup when appropriate such as MRCP Electronically Signed   By: Karen Kays M.D.   On: 09/12/2022 17:54   CT Angio Chest PE W and/or Wo Contrast  Result Date: 09/12/2022 CLINICAL DATA:  Pulmonary embolism (PE) suspected, low to intermediate prob, positive D-dimer EXAM: CT ANGIOGRAPHY CHEST WITH CONTRAST TECHNIQUE: Multidetector CT imaging of the chest was performed using the standard protocol during bolus administration of intravenous contrast. Multiplanar CT image reconstructions and MIPs were obtained to evaluate the vascular anatomy. RADIATION DOSE REDUCTION: This exam was performed according to the departmental dose-optimization program which includes automated exposure control, adjustment of the mA and/or kV according to patient size and/or use of iterative reconstruction technique. CONTRAST:  75mL OMNIPAQUE IOHEXOL 350 MG/ML SOLN COMPARISON:  Chest XR, concurrent.  CT [facility], 04/11/2020. FINDINGS: Cardiovascular: Satisfactory opacification of the pulmonary arteries to the segmental level. No evidence of pulmonary embolism. Normal  heart size. No pericardial effusion.  Mediastinum/Nodes: No enlarged mediastinal, hilar, or axillary lymph nodes. Thyroid gland, trachea, and esophagus demonstrate no significant findings. Lungs/Pleura: Lungs are clear without focal consolidation, mass or suspicious pulmonary nodule. No pleural effusion or pneumothorax. Upper Abdomen: No acute abnormality. Similar appearance of fullness at the pancreatic head and uncinate. Musculoskeletal: No acute chest wall abnormality. No acute osseous findings. Review of the MIP images confirms the above findings. IMPRESSION: 1. No segmental or larger pulmonary embolus. 2. No acute intrathoracic vascular or nonvascular findings. Electronically Signed   By: Roanna Banning M.D.   On: 09/12/2022 16:22   DG Chest 2 View  Result Date: 09/12/2022 CLINICAL DATA:  Tachycardia and elevated blood pressure. EXAM: CHEST - 2 VIEW COMPARISON:  April 09, 2020 FINDINGS: The heart size and mediastinal contours are within normal limits. Both lungs are clear. The visualized skeletal structures are unremarkable. IMPRESSION: No active cardiopulmonary disease. Electronically Signed   By: Aram Candela M.D.   On: 09/12/2022 15:45    Pending Labs Unresulted Labs (From admission, onward)     Start     Ordered   09/13/22 0500  Comprehensive metabolic panel  Tomorrow morning,   R        09/12/22 1756   09/13/22 0500  CBC  Tomorrow morning,   R        09/12/22 1756   09/13/22 0500  Protime-INR  Tomorrow morning,   R        09/12/22 1756   09/12/22 1754  HIV Antibody (routine testing w rflx)  (HIV Antibody (Routine testing w reflex) panel)  Once,   R        09/12/22 1756   09/12/22 1754  Hemoglobin A1c  (Glycemic Control (SSI)  Q 4 Hours / Glycemic Control (SSI)  AC +/- HS)  Once,   R       Comments: To assess prior glycemic control    09/12/22 1756   09/12/22 1546  Hepatitis panel, acute  Once,   URGENT        09/12/22 1545   09/12/22 1538  Urinalysis, Routine w reflex microscopic -Urine, Clean Catch  Once,    URGENT       Question:  Specimen Source  Answer:  Urine, Clean Catch   09/12/22 1537   09/12/22 1507  Lactic acid, plasma  Now then every 2 hours,   R (with STAT occurrences)      09/12/22 1507            Vitals/Pain Today's Vitals   09/12/22 1259 09/12/22 1320 09/12/22 1400 09/12/22 1730  BP: (!) 158/120  (!) 157/109 (!) 154/122  Pulse: (!) 143  (!) 115 96  Resp: 18  17 (!) 24  Temp: 99.2 F (37.3 C)   99 F (37.2 C)  TempSrc: Oral     SpO2: 95%  98% 96%  Weight:  83.9 kg    Height:  5\' 10"  (1.778 m)    PainSc:  2       Isolation Precautions No active isolations  Medications Medications  cloNIDine (CATAPRES) tablet 0.2 mg (has no administration in time range)  insulin aspart (novoLOG) injection 0-15 Units (has no administration in time range)  insulin aspart (novoLOG) injection 0-5 Units (has no administration in time range)  enoxaparin (LOVENOX) injection 40 mg (has no administration in time range)  ibuprofen (ADVIL) tablet 400 mg (has no administration in time range)  traZODone (DESYREL) tablet 25 mg (has no  administration in time range)  ondansetron (ZOFRAN) tablet 4 mg (has no administration in time range)    Or  ondansetron (ZOFRAN) injection 4 mg (has no administration in time range)  hydrALAZINE (APRESOLINE) injection 10 mg (has no administration in time range)  LORazepam (ATIVAN) tablet 1-4 mg (has no administration in time range)  thiamine (VITAMIN B1) tablet 100 mg (has no administration in time range)    Or  thiamine (VITAMIN B1) injection 100 mg (has no administration in time range)  folic acid (FOLVITE) tablet 1 mg (has no administration in time range)  multivitamin with minerals tablet 1 tablet (has no administration in time range)  hydrOXYzine (ATARAX) tablet 25 mg (has no administration in time range)  loperamide (IMODIUM) capsule 2-4 mg (has no administration in time range)  chlordiazePOXIDE (LIBRIUM) capsule 25 mg (has no administration in time  range)    Followed by  chlordiazePOXIDE (LIBRIUM) capsule 25 mg (has no administration in time range)    Followed by  chlordiazePOXIDE (LIBRIUM) capsule 25 mg (has no administration in time range)    Followed by  chlordiazePOXIDE (LIBRIUM) capsule 25 mg (has no administration in time range)  lactated ringers bolus 1,000 mL (0 mLs Intravenous Stopped 09/12/22 1530)  iohexol (OMNIPAQUE) 350 MG/ML injection 75 mL (75 mLs Intravenous Contrast Given 09/12/22 1600)    Mobility walks     Focused Assessments    R Recommendations: See Admitting Provider Note  Report given to:   Additional Notes:

## 2022-09-12 NOTE — ED Provider Notes (Addendum)
[place] EMERGENCY DEPARTMENT AT [facility] Provider Note   CSN: [id number] Arrival date & time: 09/12/22  1253     History  Chief Complaint  Patient presents with   Hypertension   rule out covid    Peter [name] is a 42 y.o. male.   Hypertension  42 year old male history of chronic alcohol use, recurrent pancreatitis, hypertension presenting for hypertension.  Patient states he has history of hypertension but ran out of his medications 2 months ago.  He was taking clonidine and metoprolol but is not taking them for 2 months.  He still taking his amlodipine.  He presents today because he has been hypertensive.  He is also concerned for COVID because he states for 2 weeks he has not been able to taste or smell things normally.  He has no headache, nausea, vomiting.  No fevers or chills.  No chest pain or difficulty breathing or abdominal pain.  He did recently return from vacation out of state.  He states he recently is been drinking 2 beers a day, but over the trip drink about 6-8 beers per day.  He last drank yesterday.  He does not feel like he is withdrawing although has history of this.  He also is history of pancreatitis but has no abdominal pain.  He denies any tremulousness.     Home Medications Prior to Admission medications   Medication Sig Start Date End Date Taking? Authorizing Provider  amLODipine (NORVASC) 10 MG tablet Take 1 tablet (10 mg total) by mouth daily. 12/17/21 01/16/22  Roemhildt, Lorin T, PA-C  cloNIDine (CATAPRES) 0.2 MG tablet Take 1 tablet (0.2 mg total) by mouth 2 (two) times daily. 12/17/21 01/16/22  Roemhildt, Lorin T, PA-C  glipiZIDE (GLUCOTROL) 5 MG tablet Take 1 tablet (5 mg total) by mouth 2 (two) times daily before a meal. 10/31/20 01/29/21  Dahal, Melina Schools, MD  losartan (COZAAR) 25 MG tablet Take 1 tablet (25 mg total) by mouth daily. 10/31/20 01/29/21  Lorin Glass, MD  metFORMIN (GLUCOPHAGE) 1000 MG tablet Take 1 tablet (1,000 mg  total) by mouth 2 (two) times daily with a meal. 10/31/20 01/29/21  Dahal, Melina Schools, MD  metoprolol tartrate (LOPRESSOR) 25 MG tablet Take 1 tablet (25 mg total) by mouth 2 (two) times daily. 12/17/21 03/17/22  Roemhildt, Lorin T, PA-C  simvastatin (ZOCOR) 20 MG tablet Take 1 tablet (20 mg total) by mouth at bedtime. 10/31/20 01/29/21  Lorin Glass, MD  venlafaxine XR (EFFEXOR-XR) 75 MG 24 hr capsule Take 1 capsule (75 mg total) by mouth daily with breakfast. 10/31/20 11/30/20  Lorin Glass, MD      Allergies    Patient has no known allergies.    Review of Systems   Review of Systems Review of systems completed and notable as per HPI.  ROS otherwise negative.   Physical Exam Updated Vital Signs BP (!) 154/122   Pulse 96   Temp 99 F (37.2 C)   Resp (!) 24   Ht 5\' 10"  (1.778 m)   Wt 83.9 kg   SpO2 96%   BMI 26.54 kg/m  Physical Exam Vitals and nursing note reviewed.  Constitutional:      General: He is not in acute distress.    Appearance: He is well-developed.  HENT:     Head: Normocephalic and atraumatic.     Nose: Nose normal. No congestion.     Mouth/Throat:     Mouth: Mucous membranes are moist.     Pharynx:  Oropharynx is clear. No oropharyngeal exudate or posterior oropharyngeal erythema.  Eyes:     Extraocular Movements: Extraocular movements intact.     Conjunctiva/sclera: Conjunctivae normal.     Pupils: Pupils are equal, round, and reactive to light.  Cardiovascular:     Rate and Rhythm: Regular rhythm. Tachycardia present.     Heart sounds: No murmur heard. Pulmonary:     Effort: Pulmonary effort is normal. No respiratory distress.     Breath sounds: Normal breath sounds.  Abdominal:     Palpations: Abdomen is soft.     Tenderness: There is no abdominal tenderness. There is no right CVA tenderness, left CVA tenderness, guarding or rebound.  Musculoskeletal:        General: No swelling.     Cervical back: Neck supple.     Right lower leg: No edema.     Left  lower leg: No edema.  Skin:    General: Skin is warm and dry.     Capillary Refill: Capillary refill takes less than 2 seconds.  Neurological:     General: No focal deficit present.     Mental Status: He is alert and oriented to person, place, and time. Mental status is at baseline.     Cranial Nerves: No cranial nerve deficit.     Sensory: No sensory deficit.     Motor: No weakness.     Comments: No tremors  Psychiatric:        Mood and Affect: Mood normal.     ED Results / Procedures / Treatments   Labs (all labs ordered are listed, but only abnormal results are displayed) Labs Reviewed  CBC WITH DIFFERENTIAL/PLATELET - Abnormal; Notable for the following components:      Result Value   MCH 34.6 (*)    MCHC 36.6 (*)    All other components within normal limits  COMPREHENSIVE METABOLIC PANEL - Abnormal; Notable for the following components:   Sodium 130 (*)    Chloride 92 (*)    Glucose, Bld 445 (*)    AST 675 (*)    ALT 480 (*)    Alkaline Phosphatase 342 (*)    Total Bilirubin 5.3 (*)    Anion gap 16 (*)    All other components within normal limits  D-DIMER, QUANTITATIVE - Abnormal; Notable for the following components:   D-Dimer, Quant 0.71 (*)    All other components within normal limits  BILIRUBIN, DIRECT - Abnormal; Notable for the following components:   Bilirubin, Direct 2.9 (*)    All other components within normal limits  BLOOD GAS, VENOUS - Abnormal; Notable for the following components:   pH, Ven 7.48 (*)    pCO2, Ven 39 (*)    pO2, Ven 58 (*)    Bicarbonate 29.0 (*)    Acid-Base Excess 5.2 (*)    All other components within normal limits  SALICYLATE LEVEL - Abnormal; Notable for the following components:   Salicylate Lvl <7.0 (*)    All other components within normal limits  RESP PANEL BY RT-PCR (RSV, FLU A&B, COVID)  RVPGX2  MRSA NEXT GEN BY PCR, NASAL  LACTIC ACID, PLASMA  PROTIME-INR  ACETAMINOPHEN LEVEL  LACTIC ACID, PLASMA  URINALYSIS,  ROUTINE W REFLEX MICROSCOPIC  HEPATITIS PANEL, ACUTE  HIV ANTIBODY (ROUTINE TESTING W REFLEX)  HEMOGLOBIN A1C  COMPREHENSIVE METABOLIC PANEL  CBC  PROTIME-INR  I-STAT VENOUS BLOOD GAS, ED  TROPONIN I (HIGH SENSITIVITY)  TROPONIN I (HIGH SENSITIVITY)  EKG EKG Interpretation Date/Time:  Tuesday September 12 2022 13:02:47 EDT Ventricular Rate:  135 PR Interval:  140 QRS Duration:  89 QT Interval:  295 QTC Calculation: 443 R Axis:   86  Text Interpretation: Sinus tachycardia LAE, consider biatrial enlargement Probable LVH with secondary repol abnrm ST depr, consider ischemia, inferior leads Confirmed by Fulton Reek [phone]) on 09/12/2022 1:23:44 PM  Radiology US Abdomen Limited RUQ (LIVER/GB)  Result Date: 09/12/2022 CLINICAL DATA:  Hyperbilirubinemia EXAM: ULTRASOUND ABDOMEN LIMITED RIGHT UPPER QUADRANT COMPARISON:  None Available. FINDINGS: Gallbladder: Distended gallbladder. Small stones are seen measuring up to 9 mm. Some sludge as well. No wall thickening or adjacent fluid. No reported sonographic Murphy's sign. Common bile duct: Diameter: 12 mm.  Abnormally enlarged. Liver: Mildly echogenic hepatic parenchyma consistent with fatty liver infiltration. Mild intrahepatic biliary ductal dilatation as well. Portal vein is patent on color Doppler imaging with normal direction of blood flow towards the liver. Other: None. IMPRESSION: Distended gallbladder with stones and some sludge. There is a dilated biliary tree. Etiology is uncertain. If there is no known history recommend further workup when appropriate such as MRCP Electronically Signed   By: Karen Kays M.D.   On: 09/12/2022 17:54   CT Angio Chest PE W and/or Wo Contrast  Result Date: 09/12/2022 CLINICAL DATA:  Pulmonary embolism (PE) suspected, low to intermediate prob, positive D-dimer EXAM: CT ANGIOGRAPHY CHEST WITH CONTRAST TECHNIQUE: Multidetector CT imaging of the chest was performed using the standard protocol during bolus  administration of intravenous contrast. Multiplanar CT image reconstructions and MIPs were obtained to evaluate the vascular anatomy. RADIATION DOSE REDUCTION: This exam was performed according to the departmental dose-optimization program which includes automated exposure control, adjustment of the mA and/or kV according to patient size and/or use of iterative reconstruction technique. CONTRAST:  75mL OMNIPAQUE IOHEXOL 350 MG/ML SOLN COMPARISON:  Chest XR, concurrent.  CT AP, 04/11/2020. FINDINGS: Cardiovascular: Satisfactory opacification of the pulmonary arteries to the segmental level. No evidence of pulmonary embolism. Normal heart size. No pericardial effusion. Mediastinum/Nodes: No enlarged mediastinal, hilar, or axillary lymph nodes. Thyroid gland, trachea, and esophagus demonstrate no significant findings. Lungs/Pleura: Lungs are clear without focal consolidation, mass or suspicious pulmonary nodule. No pleural effusion or pneumothorax. Upper Abdomen: No acute abnormality. Similar appearance of fullness at the pancreatic head and uncinate. Musculoskeletal: No acute chest wall abnormality. No acute osseous findings. Review of the MIP images confirms the above findings. IMPRESSION: 1. No segmental or larger pulmonary embolus. 2. No acute intrathoracic vascular or nonvascular findings. Electronically Signed   By: Roanna Banning M.D.   On: 09/12/2022 16:22   DG Chest 2 View  Result Date: 09/12/2022 CLINICAL DATA:  Tachycardia and elevated blood pressure. EXAM: CHEST - 2 VIEW COMPARISON:  April 09, 2020 FINDINGS: The heart size and mediastinal contours are within normal limits. Both lungs are clear. The visualized skeletal structures are unremarkable. IMPRESSION: No active cardiopulmonary disease. Electronically Signed   By: Aram Candela M.D.   On: 09/12/2022 15:45    Procedures Procedures    Medications Ordered in ED Medications  cloNIDine (CATAPRES) tablet 0.2 mg (has no administration in  time range)  insulin aspart (novoLOG) injection 0-15 Units (has no administration in time range)  insulin aspart (novoLOG) injection 0-5 Units (has no administration in time range)  enoxaparin (LOVENOX) injection 40 mg (has no administration in time range)  ibuprofen (ADVIL) tablet 400 mg (has no administration in time range)  traZODone (DESYREL) tablet 25 mg (has no  administration in time range)  ondansetron (ZOFRAN) tablet 4 mg (has no administration in time range)    Or  ondansetron (ZOFRAN) injection 4 mg (has no administration in time range)  hydrALAZINE (APRESOLINE) injection 10 mg (has no administration in time range)  LORazepam (ATIVAN) tablet 1-4 mg (has no administration in time range)  thiamine (VITAMIN B1) tablet 100 mg (has no administration in time range)    Or  thiamine (VITAMIN B1) injection 100 mg (has no administration in time range)  folic acid (FOLVITE) tablet 1 mg (has no administration in time range)  multivitamin with minerals tablet 1 tablet (has no administration in time range)  hydrOXYzine (ATARAX) tablet 25 mg (has no administration in time range)  loperamide (IMODIUM) capsule 2-4 mg (has no administration in time range)  chlordiazePOXIDE (LIBRIUM) capsule 25 mg (has no administration in time range)    Followed by  chlordiazePOXIDE (LIBRIUM) capsule 25 mg (has no administration in time range)    Followed by  chlordiazePOXIDE (LIBRIUM) capsule 25 mg (has no administration in time range)    Followed by  chlordiazePOXIDE (LIBRIUM) capsule 25 mg (has no administration in time range)  Chlorhexidine Gluconate Cloth 2 % PADS 6 each (has no administration in time range)  amLODipine (NORVASC) tablet 10 mg (has no administration in time range)  lactated ringers bolus 1,000 mL (0 mLs Intravenous Stopped 09/12/22 1530)  iohexol (OMNIPAQUE) 350 MG/ML injection 75 mL (75 mLs Intravenous Contrast Given 09/12/22 1600)    ED Course/ Medical Decision Making/ A&P Clinical Course  as of 09/12/22 1822  Tue Sep 12, 2022  1709 mansouraty [JD]    Clinical Course User Index [JD] Laurence Spates, MD                             Medical Decision Making Amount and/or Complexity of Data Reviewed Labs: ordered. Radiology: ordered.  Risk Prescription drug management. Decision regarding hospitalization.   Medical Decision Making:   SANTOS SOLLENBERGER is a 42 y.o. male who presented to the ED today with hypertension, tachycardia, concern for COVID.  On arrival he is hypertensive, tachycardic.  He is overall well-appearing.  He denies any chest pain.  EKG shows sinus tachycardia with some possible inferior ST depression, likely rate related.  Consider ACS although no chest pain.  Will obtain delta troponin.  Also consider possible PE although no shortness of breath or hypoxia, however he is markedly tachycardic with recent travel.  He has no chest pain, back pain, good extremity pulses, low concern for dissection.  Did consider possible COVID infection given recent loss of taste and smell, also possible pneumonia.  He does have significant alcohol history although has cut back.  However he was drinking more over his recent trip which could be causing his withdrawal.  He is not tremulous on exam, will start CIWA, rehydrate and evaluate.   Patient placed on continuous vitals and telemetry monitoring while in ED which was reviewed periodically.  Reviewed and confirmed nursing documentation for past medical history, family history, social history.  Initial Study Results:   Laboratory  All laboratory results reviewed.  Labs notable for anion gap metabolic acidosis with hyperglycemia although bicarb is normal.  Sodium 130.  Marked transaminitis worse from prior.  Indirect hyperbilirubinemia.  Elevated alk phos.  EKG EKG was reviewed independently. Rate, rhythm, axis, intervals all examined and without medically relevant abnormality. ST segments without concerns for elevations.  Radiology:  All images reviewed independently.  CTA chest without PE.  Chest x-ray unremarkable.  Agree with radiology report at this time.      Consults: Case discussed with gastroenterology.  Reassessment and Plan:   On reassessment tachycardia is markedly improved.  His CIWA is only 2, low concern for acute withdrawal.  However based on lab work he has findings concerning for possible acute alcoholic gastritis other INR is normal.  He has an catabolic acidosis and hyperglycemia but his blood gas is notable for alkalosis and bicarb is normal not consistent with DKA.  Troponin is normal, lactic acid is normal.  D-dimer was elevated but CTA without evidence of PE.  GI was paged due to concern for possible alcoholic hepatitis.  INR on acetaminophen and salicylate levels, although patient states he only takes occasional Tylenol and denies any significant ingestion of anything else.  I considered obstructive biliary process including cholelithiasis or cholecystitis or cholangitis, however he has no abdominal pain and benign abdominal exam and seems less consistent.  Discussed with Dr. Meridee Score with gastroenterology.  No indication for steroids at this time, recommends trending liver function, obtaining right upper quadrant ultrasound.  Salicylate undetectable, Tylenol level within limits and patient states he is only taking therapeutic doses low concern for chronic congestion.  Hepatitis panel is pending as well as right upper quadrant ultrasound.  Plan for discussion with hospitalist for admission for further workup.  Patient's presentation is most consistent with acute presentation with potential threat to life or bodily function.           Final Clinical Impression(s) / ED Diagnoses Final diagnoses:  Hepatitis    Rx / DC Orders ED Discharge Orders     None         Laurence Spates, MD 09/12/22 1821    Laurence Spates, MD 09/12/22 Rickey Primus

## 2022-09-12 NOTE — H&P (Signed)
History and Physical  Peter [name]:[id number] DOB: 1981/01/12 DOA: 09/12/2022  PCP: Oneita Hurt, No   Chief Complaint: Hypertension  HPI: Peter [name] is a 43 y.o. male with medical history significant for daily alcohol use, hypertension, depression, substance abuse diabetes and prior DKA being admitted to the hospital with hyperglycemia, uncontrolled hypertension, and abnormal liver function test.  Patient states he currently only takes amlodipine, as he lost his health insurance.  He is a daily drinker, for the last week he has been drinking about 1/5 of liquor daily up until yesterday, as he was on vacation.  States he takes a couple of Tylenol's every morning, has been having some minimal right upper quadrant abdominal pain but otherwise denies any nausea, vomiting, fevers, chills, chest pain, shortness of breath.  ED Course: Evaluation in the emergency department, he was found to have heart rate 143, blood pressure 158/120, saturating 95% on room air.  Lab work was done, shows unremarkable CBC, lactic acid, and troponin.  He has normal INR, blood sugar 445, CO2 22, AST/ALT of 675/480, direct bilirubin 2.9, total bilirubin 5.3, alk phos 342.  Review of Systems: Please see HPI for pertinent positives and negatives. A complete 10 system review of systems are otherwise negative.  Past Medical History:  Diagnosis Date   Alcoholism (HCC)    DKA (diabetic ketoacidosis) (HCC) 04/04/2020   Hypertension    Substance abuse (HCC)    Past Surgical History:  Procedure Laterality Date   DENTAL SURGERY     TIBIA IM NAIL INSERTION Right 01/22/2018   Procedure: RIGHT INTRAMEDULLARY (IM) NAIL TIBIAL;  Surgeon: Sheral Apley, MD;  Location: [facility] OR;  Service: Orthopedics;  Laterality: Right;    Social History:  reports that he has been smoking cigarettes. He has never used smokeless tobacco. He reports that he does not currently use alcohol. He reports that he does not use drugs.   No Known  Allergies  Family History  Problem Relation Age of Onset   Hypertension Father    Stroke Father    CAD Father    Cancer Paternal Grandfather        Lung   Hyperlipidemia Paternal Grandfather    Hypertension Paternal Grandfather      Prior to Admission medications   Medication Sig Start Date End Date Taking? Authorizing Provider  amLODipine (NORVASC) 10 MG tablet Take 1 tablet (10 mg total) by mouth daily. 12/17/21 01/16/22  Roemhildt, Lorin T, PA-C  cloNIDine (CATAPRES) 0.2 MG tablet Take 1 tablet (0.2 mg total) by mouth 2 (two) times daily. 12/17/21 01/16/22  Roemhildt, Lorin T, PA-C  glipiZIDE (GLUCOTROL) 5 MG tablet Take 1 tablet (5 mg total) by mouth 2 (two) times daily before a meal. 10/31/20 01/29/21  Dahal, Melina Schools, MD  losartan (COZAAR) 25 MG tablet Take 1 tablet (25 mg total) by mouth daily. 10/31/20 01/29/21  Lorin Glass, MD  metFORMIN (GLUCOPHAGE) 1000 MG tablet Take 1 tablet (1,000 mg total) by mouth 2 (two) times daily with a meal. 10/31/20 01/29/21  Dahal, Melina Schools, MD  metoprolol tartrate (LOPRESSOR) 25 MG tablet Take 1 tablet (25 mg total) by mouth 2 (two) times daily. 12/17/21 03/17/22  Roemhildt, Lorin T, PA-C  simvastatin (ZOCOR) 20 MG tablet Take 1 tablet (20 mg total) by mouth at bedtime. 10/31/20 01/29/21  Lorin Glass, MD  venlafaxine XR (EFFEXOR-XR) 75 MG 24 hr capsule Take 1 capsule (75 mg total) by mouth daily with breakfast. 10/31/20 11/30/20  Lorin Glass, MD  Physical Exam: BP (!) 154/122   Pulse 96   Temp 99 F (37.2 C)   Resp (!) 24   Ht 5\' 10"  (1.778 m)   Wt 83.9 kg   SpO2 96%   BMI 26.54 kg/m   General:  Alert, oriented, calm, in no acute distress  Eyes: EOMI, clear conjuctivae, white sclerea Neck: supple, no masses, trachea mildline  Cardiovascular: RRR, no murmurs or rubs, no peripheral edema  Respiratory: clear to auscultation bilaterally, no wheezes, no crackles  Abdomen: soft, nontender, nondistended, normal bowel tones heard  Skin: dry,  no rashes  Musculoskeletal: no joint effusions, normal range of motion  Psychiatric: appropriate affect, normal speech  Neurologic: extraocular muscles intact, clear speech, moving all extremities with intact sensorium          Labs on Admission:  Basic Metabolic Panel: Recent Labs  Lab 09/12/22 1341  NA 130*  K 3.7  CL 92*  CO2 22  GLUCOSE 445*  BUN 7  CREATININE 0.78  CALCIUM 9.3   Liver Function Tests: Recent Labs  Lab 09/12/22 1341  AST 675*  ALT 480*  ALKPHOS 342*  BILITOT 5.3*  PROT 7.9  ALBUMIN 3.8   No results for input(s): "LIPASE", "AMYLASE" in the last 168 hours. No results for input(s): "AMMONIA" in the last 168 hours. CBC: Recent Labs  Lab 09/12/22 1341  WBC 9.1  NEUTROABS 7.6  HGB 16.8  HCT 45.9  MCV 94.6  PLT 165   Cardiac Enzymes: No results for input(s): "CKTOTAL", "CKMB", "CKMBINDEX", "TROPONINI" in the last 168 hours.  BNP (last 3 results) No results for input(s): "BNP" in the last 8760 hours.  ProBNP (last 3 results) No results for input(s): "PROBNP" in the last 8760 hours.  CBG: No results for input(s): "GLUCAP" in the last 168 hours.  Radiological Exams on Admission: US Abdomen Limited RUQ (LIVER/GB)  Result Date: 09/12/2022 CLINICAL DATA:  Hyperbilirubinemia EXAM: ULTRASOUND ABDOMEN LIMITED RIGHT UPPER QUADRANT COMPARISON:  None Available. FINDINGS: Gallbladder: Distended gallbladder. Small stones are seen measuring up to 9 mm. Some sludge as well. No wall thickening or adjacent fluid. No reported sonographic Murphy's sign. Common bile duct: Diameter: 12 mm.  Abnormally enlarged. Liver: Mildly echogenic hepatic parenchyma consistent with fatty liver infiltration. Mild intrahepatic biliary ductal dilatation as well. Portal vein is patent on color Doppler imaging with normal direction of blood flow towards the liver. Other: None. IMPRESSION: Distended gallbladder with stones and some sludge. There is a dilated biliary tree. Etiology is  uncertain. If there is no known history recommend further workup when appropriate such as MRCP Electronically Signed   By: Karen Kays M.D.   On: 09/12/2022 17:54   CT Angio Chest PE W and/or Wo Contrast  Result Date: 09/12/2022 CLINICAL DATA:  Pulmonary embolism (PE) suspected, low to intermediate prob, positive D-dimer EXAM: CT ANGIOGRAPHY CHEST WITH CONTRAST TECHNIQUE: Multidetector CT imaging of the chest was performed using the standard protocol during bolus administration of intravenous contrast. Multiplanar CT image reconstructions and MIPs were obtained to evaluate the vascular anatomy. RADIATION DOSE REDUCTION: This exam was performed according to the departmental dose-optimization program which includes automated exposure control, adjustment of the mA and/or kV according to patient size and/or use of iterative reconstruction technique. CONTRAST:  75mL OMNIPAQUE IOHEXOL 350 MG/ML SOLN COMPARISON:  Chest XR, concurrent.  CT AP, 04/11/2020. FINDINGS: Cardiovascular: Satisfactory opacification of the pulmonary arteries to the segmental level. No evidence of pulmonary embolism. Normal heart size. No pericardial effusion. Mediastinum/Nodes: No  enlarged mediastinal, hilar, or axillary lymph nodes. Thyroid gland, trachea, and esophagus demonstrate no significant findings. Lungs/Pleura: Lungs are clear without focal consolidation, mass or suspicious pulmonary nodule. No pleural effusion or pneumothorax. Upper Abdomen: No acute abnormality. Similar appearance of fullness at the pancreatic head and uncinate. Musculoskeletal: No acute chest wall abnormality. No acute osseous findings. Review of the MIP images confirms the above findings. IMPRESSION: 1. No segmental or larger pulmonary embolus. 2. No acute intrathoracic vascular or nonvascular findings. Electronically Signed   By: Roanna Banning M.D.   On: 09/12/2022 16:22   DG Chest 2 View  Result Date: 09/12/2022 CLINICAL DATA:  Tachycardia and elevated blood  pressure. EXAM: CHEST - 2 VIEW COMPARISON:  April 09, 2020 FINDINGS: The heart size and mediastinal contours are within normal limits. Both lungs are clear. The visualized skeletal structures are unremarkable. IMPRESSION: No active cardiopulmonary disease. Electronically Signed   By: Aram Candela M.D.   On: 09/12/2022 15:45    Assessment/Plan 42 year old male with history of uncontrolled hypertension, untreated diabetes, daily alcohol abuse being admitted to the hospital with hypertension, hyperglycemia, and hepatitis.  Hepatitis-with evidence of obstructive pattern, unclear etiology -Observation admission -Check right upper quadrant ultrasound, and acute hepatitis panel -ER provider has discussed with gastroenterology Dr. Meridee Score, who will consult -Avoid hepatotoxins, note Tylenol level 19 -Will trend liver function tests, and INR  Uncontrolled hypertension-was previously on amlodipine, clonidine and metoprolol -Will continue amlodipine and clonidine at previous doses -IV hydralazine as needed for SBP over 180  Type 2 diabetes-untreated, presumably uncontrolled patient presents with hyperglycemia without evidence of acidosis -Diabetic diet -Sliding scale insulin -Check hemoglobin A1c  Alcohol abuse-he has a history of daily alcohol use, with recent binge, last drink was on 7/22 -Thiamine, folate, multivitamin -Start Librium taper, due to history of recent binge, and prior hospital stays for alcohol withdrawal -P.o. Ativan per CIWA protocol  DVT prophylaxis: Lovenox     Code Status: Full Code  Consults called: None  Admission status: Observation  Time spent: 45 minutes  Ty Buntrock Sharlette Dense MD [facility] Pager [phone]  If 7PM-7AM, please contact night-coverage [email or web address] Password [facility]  09/12/2022, 5:57 PM

## 2022-09-13 ENCOUNTER — Inpatient Hospital Stay (HOSPITAL_COMMUNITY): Payer: Self-pay

## 2022-09-13 DIAGNOSIS — E1165 Type 2 diabetes mellitus with hyperglycemia: Secondary | ICD-10-CM

## 2022-09-13 DIAGNOSIS — K8689 Other specified diseases of pancreas: Secondary | ICD-10-CM | POA: Diagnosis present

## 2022-09-13 DIAGNOSIS — R748 Abnormal levels of other serum enzymes: Secondary | ICD-10-CM

## 2022-09-13 DIAGNOSIS — I10 Essential (primary) hypertension: Secondary | ICD-10-CM

## 2022-09-13 DIAGNOSIS — K8050 Calculus of bile duct without cholangitis or cholecystitis without obstruction: Secondary | ICD-10-CM | POA: Diagnosis present

## 2022-09-13 LAB — CBC
HCT: 42.4 % (ref 39.0–52.0)
Hemoglobin: 15.1 g/dL (ref 13.0–17.0)
MCH: 35 pg — ABNORMAL HIGH (ref 26.0–34.0)
MCHC: 35.6 g/dL (ref 30.0–36.0)
MCV: 98.1 fL (ref 80.0–100.0)
Platelets: 144 10*3/uL — ABNORMAL LOW (ref 150–400)
RBC: 4.32 MIL/uL (ref 4.22–5.81)
RDW: 12.5 % (ref 11.5–15.5)
WBC: 6.7 10*3/uL (ref 4.0–10.5)
nRBC: 0 % (ref 0.0–0.2)

## 2022-09-13 LAB — URINALYSIS, ROUTINE W REFLEX MICROSCOPIC
Bacteria, UA: NONE SEEN
Glucose, UA: >=500 mg/dL — AB
Hgb urine dipstick: NEGATIVE
Ketones, ur: 20 mg/dL — AB
Leukocytes,Ua: NEGATIVE
Nitrite: NEGATIVE
Protein, ur: 30 mg/dL — AB
Specific Gravity, Urine: 1.03 (ref 1.005–1.030)
pH: 6 (ref 5.0–8.0)

## 2022-09-13 LAB — COMPREHENSIVE METABOLIC PANEL
ALT: 440 U/L — ABNORMAL HIGH (ref 0–44)
AST: 491 U/L — ABNORMAL HIGH (ref 15–41)
Albumin: 3.4 g/dL — ABNORMAL LOW (ref 3.5–5.0)
Alkaline Phosphatase: 299 U/L — ABNORMAL HIGH (ref 38–126)
Anion gap: 11 (ref 5–15)
BUN: 7 mg/dL (ref 6–20)
CO2: 25 mmol/L (ref 22–32)
Calcium: 8.8 mg/dL — ABNORMAL LOW (ref 8.9–10.3)
Chloride: 94 mmol/L — ABNORMAL LOW (ref 98–111)
Creatinine, Ser: 0.73 mg/dL (ref 0.61–1.24)
GFR, Estimated: >60 mL/min (ref >60)
Glucose, Bld: 269 mg/dL — ABNORMAL HIGH (ref 70–99)
Potassium: 3.6 mmol/L (ref 3.5–5.1)
Sodium: 130 mmol/L — ABNORMAL LOW (ref 135–145)
Total Bilirubin: 7.2 mg/dL — ABNORMAL HIGH (ref 0.3–1.2)
Total Protein: 7 g/dL (ref 6.5–8.1)

## 2022-09-13 LAB — GLUCOSE, CAPILLARY
Glucose-Capillary: 267 mg/dL — ABNORMAL HIGH (ref 70–99)
Glucose-Capillary: 217 mg/dL — ABNORMAL HIGH (ref 70–99)
Glucose-Capillary: 194 mg/dL — ABNORMAL HIGH (ref 70–99)
Glucose-Capillary: 211 mg/dL — ABNORMAL HIGH (ref 70–99)

## 2022-09-13 LAB — LIPASE, BLOOD: Lipase: 103 U/L — ABNORMAL HIGH (ref 11–51)

## 2022-09-13 LAB — PROTIME-INR
INR: 1.1 (ref 0.8–1.2)
Prothrombin Time: 14.5 seconds (ref 11.4–15.2)

## 2022-09-13 LAB — HEMOGLOBIN A1C
Hgb A1c MFr Bld: 8.9 % — ABNORMAL HIGH (ref 4.8–5.6)
Mean Plasma Glucose: 208.73 mg/dL

## 2022-09-13 LAB — HIV ANTIBODY (ROUTINE TESTING W REFLEX): HIV Screen 4th Generation wRfx: NONREACTIVE

## 2022-09-13 MED ORDER — GADOBUTROL 1 MMOL/ML IV SOLN
8.0000 mL | Freq: Once | INTRAVENOUS | Status: AC | PRN
  Administered 2022-09-13: 8 mL via INTRAVENOUS

## 2022-09-13 MED ORDER — SODIUM CHLORIDE 0.9 % IV SOLN: INTRAVENOUS | Status: AC

## 2022-09-13 MED ORDER — INSULIN ASPART 100 UNIT/ML IJ SOLN
0.0000 [IU] | Freq: Three times a day (TID) | INTRAMUSCULAR | Status: DC
  Administered 2022-09-14: 3 [IU] via SUBCUTANEOUS
  Administered 2022-09-14: 5 [IU] via SUBCUTANEOUS
  Administered 2022-09-14: 8 [IU] via SUBCUTANEOUS
  Administered 2022-09-15: 3 [IU] via SUBCUTANEOUS
  Administered 2022-09-15: 8 [IU] via SUBCUTANEOUS

## 2022-09-13 MED ORDER — INSULIN ASPART 100 UNIT/ML IJ SOLN
0.0000 [IU] | INTRAMUSCULAR | Status: DC
  Administered 2022-09-13 (×2): 5 [IU] via SUBCUTANEOUS
  Administered 2022-09-13: 3 [IU] via SUBCUTANEOUS
  Administered 2022-09-13: 8 [IU] via SUBCUTANEOUS

## 2022-09-13 NOTE — Plan of Care (Signed)

## 2022-09-13 NOTE — Consult Note (Signed)
[facility] Consultation Note  Referring Provider: [facility] Primary Care Physician:  Pcp, No Primary Gastroenterologist:  Gentry Fitz  Reason for Consultation:  elevated LFTs  HPI: Peter [name] is a 42 y.o. male admitted for high blood pressure and elevated LFTs.  History of alcohol abuse (downtrending now, but recent transient increase again over weekend) and history of prior pancreatitis with pseudocyst within past couple years.  Found to have elevated LFTs.  Has had intermittent, but progressive, post-prandial right upper quadrant abdominal pain.  Imaging (U/S) showed gallstones and sludge and CBD 12 mm; MRCP pending.  No fevers.  No vomiting, blood in stool, hematemesis.     Past Medical History:  Diagnosis Date   Alcoholism (HCC)    DKA (diabetic ketoacidosis) (HCC) 04/04/2020   Hypertension    Substance abuse (HCC)     Past Surgical History:  Procedure Laterality Date   DENTAL SURGERY     TIBIA IM NAIL INSERTION Right 01/22/2018   Procedure: RIGHT INTRAMEDULLARY (IM) NAIL TIBIAL;  Surgeon: Sheral Apley, MD;  Location: [facility] OR;  Service: Orthopedics;  Laterality: Right;    Prior to Admission medications   Medication Sig Start Date End Date Taking? Authorizing Provider  amLODipine (NORVASC) 10 MG tablet Take 1 tablet (10 mg total) by mouth daily. 12/17/21 01/16/22  Roemhildt, Lorin T, PA-C  cloNIDine (CATAPRES) 0.2 MG tablet Take 1 tablet (0.2 mg total) by mouth 2 (two) times daily. 12/17/21 01/16/22  Roemhildt, Lorin T, PA-C  glipiZIDE (GLUCOTROL) 5 MG tablet Take 1 tablet (5 mg total) by mouth 2 (two) times daily before a meal. 10/31/20 01/29/21  Dahal, Melina Schools, MD  losartan (COZAAR) 25 MG tablet Take 1 tablet (25 mg total) by mouth daily. 10/31/20 01/29/21  Lorin Glass, MD  metFORMIN (GLUCOPHAGE) 1000 MG tablet Take 1 tablet (1,000 mg total) by mouth 2 (two) times daily with a meal. 10/31/20 01/29/21  Dahal, Melina Schools, MD  metoprolol tartrate (LOPRESSOR)  25 MG tablet Take 1 tablet (25 mg total) by mouth 2 (two) times daily. 12/17/21 03/17/22  Roemhildt, Lorin T, PA-C  simvastatin (ZOCOR) 20 MG tablet Take 1 tablet (20 mg total) by mouth at bedtime. 10/31/20 01/29/21  Lorin Glass, MD  venlafaxine XR (EFFEXOR-XR) 75 MG 24 hr capsule Take 1 capsule (75 mg total) by mouth daily with breakfast. 10/31/20 11/30/20  Lorin Glass, MD    Current Facility-Administered Medications  Medication Dose Route Frequency Provider Last Rate Last Admin   0.9 %  sodium chloride infusion   Intravenous Continuous Marinda Elk, MD 75 mL/hr at 09/13/22 0821 New Bag at 09/13/22 0821   amLODipine (NORVASC) tablet 10 mg  10 mg Oral Daily Marinda Elk, MD   10 mg at 09/13/22 0931   chlordiazePOXIDE (LIBRIUM) capsule 25 mg  25 mg Oral QID Marinda Elk, MD   25 mg at 09/13/22 7829   Followed by   Melene Muller ON 09/14/2022] chlordiazePOXIDE (LIBRIUM) capsule 25 mg  25 mg Oral TID Marinda Elk, MD       Followed by   Melene Muller ON 09/15/2022] chlordiazePOXIDE (LIBRIUM) capsule 25 mg  25 mg Oral BH-qamhs Marinda Elk, MD       Followed by   Melene Muller ON 09/16/2022] chlordiazePOXIDE (LIBRIUM) capsule 25 mg  25 mg Oral Daily Marinda Elk, MD       Chlorhexidine Gluconate Cloth 2 % PADS 6 each  6 each Topical Daily Marinda Elk, MD   6 each at 09/12/22 [phone]  cloNIDine (CATAPRES) tablet 0.2 mg  0.2 mg Oral BID Marinda Elk, MD   0.2 mg at 09/13/22 0931   enoxaparin (LOVENOX) injection 40 mg  40 mg Subcutaneous Q24H Marinda Elk, MD   40 mg at 09/12/22 2151   folic acid (FOLVITE) tablet 1 mg  1 mg Oral Daily Marinda Elk, MD   1 mg at 09/13/22 0931   gadobutrol (GADAVIST) 1 MMOL/ML injection 8 mL  8 mL Intravenous Once PRN Marinda Elk, MD       hydrALAZINE (APRESOLINE) injection 10 mg  10 mg Intravenous Q6H PRN Marinda Elk, MD       hydrOXYzine (ATARAX) tablet 25 mg  25 mg Oral Q6H PRN Marinda Elk, MD       ibuprofen (ADVIL) tablet 400 mg  400 mg Oral Q6H PRN Marinda Elk, MD       insulin aspart (novoLOG) injection 0-15 Units  0-15 Units Subcutaneous Q4H Marinda Elk, MD   8 Units at 09/13/22 0815   loperamide (IMODIUM) capsule 2-4 mg  2-4 mg Oral PRN Marinda Elk, MD       LORazepam (ATIVAN) tablet 1-4 mg  1-4 mg Oral Q1H PRN Marinda Elk, MD       multivitamin with minerals tablet 1 tablet  1 tablet Oral Daily Marinda Elk, MD   1 tablet at 09/13/22 0932   ondansetron (ZOFRAN) tablet 4 mg  4 mg Oral Q6H PRN Marinda Elk, MD       Or   ondansetron [facility]) injection 4 mg  4 mg Intravenous Q6H PRN Marinda Elk, MD       thiamine (VITAMIN B1) tablet 100 mg  100 mg Oral Daily Marinda Elk, MD   100 mg at 09/13/22 1610   Or   thiamine (VITAMIN B1) injection 100 mg  100 mg Intravenous Daily Marinda Elk, MD       traZODone (DESYREL) tablet 25 mg  25 mg Oral QHS PRN Marinda Elk, MD        Allergies as of 09/12/2022   (No Known Allergies)    Family History  Problem Relation Age of Onset   Hypertension Father    Stroke Father    CAD Father    Cancer Paternal Grandfather        Lung   Hyperlipidemia Paternal Grandfather    Hypertension Paternal Grandfather     Social History   Socioeconomic History   Marital status: Single    Spouse name: Not on file   Number of children: Not on file   Years of education: Not on file   Highest education level: Not on file  Occupational History   Not on file  Tobacco Use   Smoking status: Every Day    Current packs/day: 0.50    Types: Cigarettes   Smokeless tobacco: Never  Vaping Use   Vaping status: Never Used  Substance and Sexual Activity   Alcohol use: Not Currently    Comment:     Drug use: No   Sexual activity: Yes    Birth control/protection: Condom  Other Topics Concern   Not on file  Social History Narrative   Not on file   Social  Determinants of Health   Financial Resource Strain: Not on file  Food Insecurity: Not on file  Transportation Needs: Not on file  Physical Activity: Not on file  Stress: Not on file  Social  Connections: Not on file  Intimate Partner Violence: Not on file    Review of Systems: As per HPI, all others negative.  Physical Exam: Vital signs in last 24 hours: Temp:  [97.7 F (36.5 C)-99.2 F (37.3 C)] 97.8 F (36.6 C) (07/24 0738) Pulse Rate:  [84-143] 86 (07/24 1000) Resp:  [10-27] 17 (07/24 1000) BP: (122-195)/(85-141) 142/102 (07/24 1000) SpO2:  [94 %-100 %] 99 % (07/24 1000) Weight:  [81 kg-83.9 kg] 81 kg (07/23 1841) Last BM Date :  (per patient 09/12/22 AM) General:   Alert,  Well-developed, well-nourished, pleasant and cooperative in NAD Head:  Normocephalic and atraumatic. Eyes:  Sclera icteric,   Conjunctiva pink. Ears:  Normal auditory acuity. Nose:  No deformity, discharge,  or lesions. Mouth:  No deformity or lesions.  Oropharynx pink & moist. Neck:  Supple; no masses or thyromegaly. Lungs:  No respiratory distress Abdomen:  Soft, mild epigastric tenderness without peritonitis, No masses, hepatosplenomegaly or hernias noted. without guarding, and without rebound.     Msk:  Symmetrical without gross deformities. Normal posture. Pulses:  Normal pulses noted. Extremities:  Without clubbing or edema. Neurologic:  Alert and  oriented x4;  grossly normal neurologically. Skin: Jaundiced, otherwise Intact without significant lesions or rashes. Psych:  Alert and cooperative. Normal mood and affect.   Lab Results: Recent Labs    09/12/22 1341 09/13/22 0302  WBC 9.1 6.7  HGB 16.8 15.1  HCT 45.9 42.4  PLT 165 144*   BMET Recent Labs    09/12/22 1341 09/13/22 0302  NA 130* 130*  K 3.7 3.6  CL 92* 94*  CO2 22 25  GLUCOSE 445* 269*  BUN 7 7  CREATININE 0.78 0.73  CALCIUM 9.3 8.8*   LFT Recent Labs    09/12/22 1529 09/13/22 0302  PROT  --  7.0  ALBUMIN  --   3.4*  AST  --  491*  ALT  --  440*  ALKPHOS  --  299*  BILITOT  --  7.2*  BILIDIR 2.9*  --    PT/INR Recent Labs    09/12/22 1529 09/13/22 0302  LABPROT 13.7 14.5  INR 1.0 1.1    Studies/Results: US Abdomen Limited RUQ (LIVER/GB)  Result Date: 09/12/2022 CLINICAL DATA:  Hyperbilirubinemia EXAM: ULTRASOUND ABDOMEN LIMITED RIGHT UPPER QUADRANT COMPARISON:  None Available. FINDINGS: Gallbladder: Distended gallbladder. Small stones are seen measuring up to 9 mm. Some sludge as well. No wall thickening or adjacent fluid. No reported sonographic Murphy's sign. Common bile duct: Diameter: 12 mm.  Abnormally enlarged. Liver: Mildly echogenic hepatic parenchyma consistent with fatty liver infiltration. Mild intrahepatic biliary ductal dilatation as well. Portal vein is patent on color Doppler imaging with normal direction of blood flow towards the liver. Other: None. IMPRESSION: Distended gallbladder with stones and some sludge. There is a dilated biliary tree. Etiology is uncertain. If there is no known history recommend further workup when appropriate such as MRCP Electronically Signed   By: Karen Kays M.D.   On: 09/12/2022 17:54   CT Angio Chest PE W and/or Wo Contrast  Result Date: 09/12/2022 CLINICAL DATA:  Pulmonary embolism (PE) suspected, low to intermediate prob, positive D-dimer EXAM: CT ANGIOGRAPHY CHEST WITH CONTRAST TECHNIQUE: Multidetector CT imaging of the chest was performed using the standard protocol during bolus administration of intravenous contrast. Multiplanar CT image reconstructions and MIPs were obtained to evaluate the vascular anatomy. RADIATION DOSE REDUCTION: This exam was performed according to the departmental dose-optimization program which includes automated exposure  control, adjustment of the mA and/or kV according to patient size and/or use of iterative reconstruction technique. CONTRAST:  75mL OMNIPAQUE IOHEXOL 350 MG/ML SOLN COMPARISON:  Chest XR, concurrent.  CT  AP, 04/11/2020. FINDINGS: Cardiovascular: Satisfactory opacification of the pulmonary arteries to the segmental level. No evidence of pulmonary embolism. Normal heart size. No pericardial effusion. Mediastinum/Nodes: No enlarged mediastinal, hilar, or axillary lymph nodes. Thyroid gland, trachea, and esophagus demonstrate no significant findings. Lungs/Pleura: Lungs are clear without focal consolidation, mass or suspicious pulmonary nodule. No pleural effusion or pneumothorax. Upper Abdomen: No acute abnormality. Similar appearance of fullness at the pancreatic head and uncinate. Musculoskeletal: No acute chest wall abnormality. No acute osseous findings. Review of the MIP images confirms the above findings. IMPRESSION: 1. No segmental or larger pulmonary embolus. 2. No acute intrathoracic vascular or nonvascular findings. Electronically Signed   By: Roanna Banning M.D.   On: 09/12/2022 16:22   DG Chest 2 View  Result Date: 09/12/2022 CLINICAL DATA:  Tachycardia and elevated blood pressure. EXAM: CHEST - 2 VIEW COMPARISON:  April 09, 2020 FINDINGS: The heart size and mediastinal contours are within normal limits. Both lungs are clear. The visualized skeletal structures are unremarkable. IMPRESSION: No active cardiopulmonary disease. Electronically Signed   By: Aram Candela M.D.   On: 09/12/2022 15:45    Impression:   Recurrent pancreatitis.  Could be either from alcohol (presumed cause in past), but with his concomitant abdominal pain (biliary-sounding, RUQ, post-prandial) and his gallstones and dilatated CBD on ultrasound,I think gallstone-mediated is more likely cause. Alcohol abuse.  Decreasing but had recent increase over the weekend. Gallstones.  Dilated bile duct. Elevated LFTs with jaundice,  Suspect more from biliary tract source; MRCP pending; do not think this is alcohol hepatitis but can't be definitively excluded. RUQ pain, post-prandial, intermittent for years.  Plan:   Complete  alcohol abstinence counseled. MRCP pending. Supportive treatment of pancreatitis. Pending MRCP findings, would consider surgical consultation for consideration of cholecystectomy. [facility] will follow.   LOS: 0 days   Dravyn Severs M  09/13/2022, 11:23 AM  Cell [phone] If no answer or after 5 PM call [phone]

## 2022-09-13 NOTE — H&P (View-Only) (Signed)
[facility] Consultation Note  Referring Provider: [facility] Primary Care Physician:  Pcp, No Primary Gastroenterologist:  Gentry Fitz  Reason for Consultation:  elevated LFTs  HPI: Peter [name] is a 42 y.o. male admitted for high blood pressure and elevated LFTs.  History of alcohol abuse (downtrending now, but recent transient increase again over weekend) and history of prior pancreatitis with pseudocyst within past couple years.  Found to have elevated LFTs.  Has had intermittent, but progressive, post-prandial right upper quadrant abdominal pain.  Imaging (U/S) showed gallstones and sludge and CBD 12 mm; MRCP pending.  No fevers.  No vomiting, blood in stool, hematemesis.     Past Medical History:  Diagnosis Date   Alcoholism (HCC)    DKA (diabetic ketoacidosis) (HCC) 04/04/2020   Hypertension    Substance abuse (HCC)     Past Surgical History:  Procedure Laterality Date   DENTAL SURGERY     TIBIA IM NAIL INSERTION Right 01/22/2018   Procedure: RIGHT INTRAMEDULLARY (IM) NAIL TIBIAL;  Surgeon: Sheral Apley, MD;  Location: [facility] OR;  Service: Orthopedics;  Laterality: Right;    Prior to Admission medications   Medication Sig Start Date End Date Taking? Authorizing Provider  amLODipine (NORVASC) 10 MG tablet Take 1 tablet (10 mg total) by mouth daily. 12/17/21 01/16/22  Roemhildt, Lorin T, PA-C  cloNIDine (CATAPRES) 0.2 MG tablet Take 1 tablet (0.2 mg total) by mouth 2 (two) times daily. 12/17/21 01/16/22  Roemhildt, Lorin T, PA-C  glipiZIDE (GLUCOTROL) 5 MG tablet Take 1 tablet (5 mg total) by mouth 2 (two) times daily before a meal. 10/31/20 01/29/21  Dahal, Melina Schools, MD  losartan (COZAAR) 25 MG tablet Take 1 tablet (25 mg total) by mouth daily. 10/31/20 01/29/21  Lorin Glass, MD  metFORMIN (GLUCOPHAGE) 1000 MG tablet Take 1 tablet (1,000 mg total) by mouth 2 (two) times daily with a meal. 10/31/20 01/29/21  Dahal, Melina Schools, MD  metoprolol tartrate (LOPRESSOR)  25 MG tablet Take 1 tablet (25 mg total) by mouth 2 (two) times daily. 12/17/21 03/17/22  Roemhildt, Lorin T, PA-C  simvastatin (ZOCOR) 20 MG tablet Take 1 tablet (20 mg total) by mouth at bedtime. 10/31/20 01/29/21  Lorin Glass, MD  venlafaxine XR (EFFEXOR-XR) 75 MG 24 hr capsule Take 1 capsule (75 mg total) by mouth daily with breakfast. 10/31/20 11/30/20  Lorin Glass, MD    Current Facility-Administered Medications  Medication Dose Route Frequency Provider Last Rate Last Admin   0.9 %  sodium chloride infusion   Intravenous Continuous Marinda Elk, MD 75 mL/hr at 09/13/22 0821 New Bag at 09/13/22 0821   amLODipine (NORVASC) tablet 10 mg  10 mg Oral Daily Marinda Elk, MD   10 mg at 09/13/22 0931   chlordiazePOXIDE (LIBRIUM) capsule 25 mg  25 mg Oral QID Marinda Elk, MD   25 mg at 09/13/22 4010   Followed by   Melene Muller ON 09/14/2022] chlordiazePOXIDE (LIBRIUM) capsule 25 mg  25 mg Oral TID Marinda Elk, MD       Followed by   Melene Muller ON 09/15/2022] chlordiazePOXIDE (LIBRIUM) capsule 25 mg  25 mg Oral BH-qamhs Marinda Elk, MD       Followed by   Melene Muller ON 09/16/2022] chlordiazePOXIDE (LIBRIUM) capsule 25 mg  25 mg Oral Daily Marinda Elk, MD       Chlorhexidine Gluconate Cloth 2 % PADS 6 each  6 each Topical Daily Marinda Elk, MD   6 each at 09/12/22 [phone]  cloNIDine (CATAPRES) tablet 0.2 mg  0.2 mg Oral BID Marinda Elk, MD   0.2 mg at 09/13/22 0931   enoxaparin (LOVENOX) injection 40 mg  40 mg Subcutaneous Q24H Marinda Elk, MD   40 mg at 09/12/22 2151   folic acid (FOLVITE) tablet 1 mg  1 mg Oral Daily Marinda Elk, MD   1 mg at 09/13/22 0931   gadobutrol (GADAVIST) 1 MMOL/ML injection 8 mL  8 mL Intravenous Once PRN Marinda Elk, MD       hydrALAZINE (APRESOLINE) injection 10 mg  10 mg Intravenous Q6H PRN Marinda Elk, MD       hydrOXYzine (ATARAX) tablet 25 mg  25 mg Oral Q6H PRN Marinda Elk, MD       ibuprofen (ADVIL) tablet 400 mg  400 mg Oral Q6H PRN Marinda Elk, MD       insulin aspart (novoLOG) injection 0-15 Units  0-15 Units Subcutaneous Q4H Marinda Elk, MD   8 Units at 09/13/22 0815   loperamide (IMODIUM) capsule 2-4 mg  2-4 mg Oral PRN Marinda Elk, MD       LORazepam (ATIVAN) tablet 1-4 mg  1-4 mg Oral Q1H PRN Marinda Elk, MD       multivitamin with minerals tablet 1 tablet  1 tablet Oral Daily Marinda Elk, MD   1 tablet at 09/13/22 0932   ondansetron (ZOFRAN) tablet 4 mg  4 mg Oral Q6H PRN Marinda Elk, MD       Or   ondansetron [facility]) injection 4 mg  4 mg Intravenous Q6H PRN Marinda Elk, MD       thiamine (VITAMIN B1) tablet 100 mg  100 mg Oral Daily Marinda Elk, MD   100 mg at 09/13/22 1610   Or   thiamine (VITAMIN B1) injection 100 mg  100 mg Intravenous Daily Marinda Elk, MD       traZODone (DESYREL) tablet 25 mg  25 mg Oral QHS PRN Marinda Elk, MD        Allergies as of 09/12/2022   (No Known Allergies)    Family History  Problem Relation Age of Onset   Hypertension Father    Stroke Father    CAD Father    Cancer Paternal Grandfather        Lung   Hyperlipidemia Paternal Grandfather    Hypertension Paternal Grandfather     Social History   Socioeconomic History   Marital status: Single    Spouse name: Not on file   Number of children: Not on file   Years of education: Not on file   Highest education level: Not on file  Occupational History   Not on file  Tobacco Use   Smoking status: Every Day    Current packs/day: 0.50    Types: Cigarettes   Smokeless tobacco: Never  Vaping Use   Vaping status: Never Used  Substance and Sexual Activity   Alcohol use: Not Currently    Comment:     Drug use: No   Sexual activity: Yes    Birth control/protection: Condom  Other Topics Concern   Not on file  Social History Narrative   Not on file   Social  Determinants of Health   Financial Resource Strain: Not on file  Food Insecurity: Not on file  Transportation Needs: Not on file  Physical Activity: Not on file  Stress: Not on file  Social  Connections: Not on file  Intimate Partner Violence: Not on file    Review of Systems: As per HPI, all others negative.  Physical Exam: Vital signs in last 24 hours: Temp:  [97.7 F (36.5 C)-99.2 F (37.3 C)] 97.8 F (36.6 C) (07/24 0738) Pulse Rate:  [84-143] 86 (07/24 1000) Resp:  [10-27] 17 (07/24 1000) BP: (122-195)/(85-141) 142/102 (07/24 1000) SpO2:  [94 %-100 %] 99 % (07/24 1000) Weight:  [81 kg-83.9 kg] 81 kg (07/23 1841) Last BM Date :  (per patient 09/12/22 AM) General:   Alert,  Well-developed, well-nourished, pleasant and cooperative in NAD Head:  Normocephalic and atraumatic. Eyes:  Sclera icteric,   Conjunctiva pink. Ears:  Normal auditory acuity. Nose:  No deformity, discharge,  or lesions. Mouth:  No deformity or lesions.  Oropharynx pink & moist. Neck:  Supple; no masses or thyromegaly. Lungs:  No respiratory distress Abdomen:  Soft, mild epigastric tenderness without peritonitis, No masses, hepatosplenomegaly or hernias noted. without guarding, and without rebound.     Msk:  Symmetrical without gross deformities. Normal posture. Pulses:  Normal pulses noted. Extremities:  Without clubbing or edema. Neurologic:  Alert and  oriented x4;  grossly normal neurologically. Skin: Jaundiced, otherwise Intact without significant lesions or rashes. Psych:  Alert and cooperative. Normal mood and affect.   Lab Results: Recent Labs    09/12/22 1341 09/13/22 0302  WBC 9.1 6.7  HGB 16.8 15.1  HCT 45.9 42.4  PLT 165 144*   BMET Recent Labs    09/12/22 1341 09/13/22 0302  NA 130* 130*  K 3.7 3.6  CL 92* 94*  CO2 22 25  GLUCOSE 445* 269*  BUN 7 7  CREATININE 0.78 0.73  CALCIUM 9.3 8.8*   LFT Recent Labs    09/12/22 1529 09/13/22 0302  PROT  --  7.0  ALBUMIN  --   3.4*  AST  --  491*  ALT  --  440*  ALKPHOS  --  299*  BILITOT  --  7.2*  BILIDIR 2.9*  --    PT/INR Recent Labs    09/12/22 1529 09/13/22 0302  LABPROT 13.7 14.5  INR 1.0 1.1    Studies/Results: US Abdomen Limited RUQ (LIVER/GB)  Result Date: 09/12/2022 CLINICAL DATA:  Hyperbilirubinemia EXAM: ULTRASOUND ABDOMEN LIMITED RIGHT UPPER QUADRANT COMPARISON:  None Available. FINDINGS: Gallbladder: Distended gallbladder. Small stones are seen measuring up to 9 mm. Some sludge as well. No wall thickening or adjacent fluid. No reported sonographic Murphy's sign. Common bile duct: Diameter: 12 mm.  Abnormally enlarged. Liver: Mildly echogenic hepatic parenchyma consistent with fatty liver infiltration. Mild intrahepatic biliary ductal dilatation as well. Portal vein is patent on color Doppler imaging with normal direction of blood flow towards the liver. Other: None. IMPRESSION: Distended gallbladder with stones and some sludge. There is a dilated biliary tree. Etiology is uncertain. If there is no known history recommend further workup when appropriate such as MRCP Electronically Signed   By: Karen Kays M.D.   On: 09/12/2022 17:54   CT Angio Chest PE W and/or Wo Contrast  Result Date: 09/12/2022 CLINICAL DATA:  Pulmonary embolism (PE) suspected, low to intermediate prob, positive D-dimer EXAM: CT ANGIOGRAPHY CHEST WITH CONTRAST TECHNIQUE: Multidetector CT imaging of the chest was performed using the standard protocol during bolus administration of intravenous contrast. Multiplanar CT image reconstructions and MIPs were obtained to evaluate the vascular anatomy. RADIATION DOSE REDUCTION: This exam was performed according to the departmental dose-optimization program which includes automated exposure  control, adjustment of the mA and/or kV according to patient size and/or use of iterative reconstruction technique. CONTRAST:  75mL OMNIPAQUE IOHEXOL 350 MG/ML SOLN COMPARISON:  Chest XR, concurrent.  CT  AP, 04/11/2020. FINDINGS: Cardiovascular: Satisfactory opacification of the pulmonary arteries to the segmental level. No evidence of pulmonary embolism. Normal heart size. No pericardial effusion. Mediastinum/Nodes: No enlarged mediastinal, hilar, or axillary lymph nodes. Thyroid gland, trachea, and esophagus demonstrate no significant findings. Lungs/Pleura: Lungs are clear without focal consolidation, mass or suspicious pulmonary nodule. No pleural effusion or pneumothorax. Upper Abdomen: No acute abnormality. Similar appearance of fullness at the pancreatic head and uncinate. Musculoskeletal: No acute chest wall abnormality. No acute osseous findings. Review of the MIP images confirms the above findings. IMPRESSION: 1. No segmental or larger pulmonary embolus. 2. No acute intrathoracic vascular or nonvascular findings. Electronically Signed   By: Roanna Banning M.D.   On: 09/12/2022 16:22   DG Chest 2 View  Result Date: 09/12/2022 CLINICAL DATA:  Tachycardia and elevated blood pressure. EXAM: CHEST - 2 VIEW COMPARISON:  April 09, 2020 FINDINGS: The heart size and mediastinal contours are within normal limits. Both lungs are clear. The visualized skeletal structures are unremarkable. IMPRESSION: No active cardiopulmonary disease. Electronically Signed   By: Aram Candela M.D.   On: 09/12/2022 15:45    Impression:   Recurrent pancreatitis.  Could be either from alcohol (presumed cause in past), but with his concomitant abdominal pain (biliary-sounding, RUQ, post-prandial) and his gallstones and dilatated CBD on ultrasound,I think gallstone-mediated is more likely cause. Alcohol abuse.  Decreasing but had recent increase over the weekend. Gallstones.  Dilated bile duct. Elevated LFTs with jaundice,  Suspect more from biliary tract source; MRCP pending; do not think this is alcohol hepatitis but can't be definitively excluded. RUQ pain, post-prandial, intermittent for years.  Plan:   Complete  alcohol abstinence counseled. MRCP pending. Supportive treatment of pancreatitis. Pending MRCP findings, would consider surgical consultation for consideration of cholecystectomy. [facility] will follow.   LOS: 0 days   Darienne Belleau M  09/13/2022, 11:23 AM  Cell [phone] If no answer or after 5 PM call [phone]

## 2022-09-13 NOTE — Plan of Care (Signed)
  Problem: Education: Goal: Knowledge of General Education information will improve Description: Including pain rating scale, medication(s)/side effects and non-pharmacologic comfort measures Outcome: Progressing   Problem: Clinical Measurements: Goal: Will remain free from infection Outcome: Progressing Goal: Diagnostic test results will improve Outcome: Progressing Goal: Cardiovascular complication will be avoided Outcome: Progressing   Problem: Activity: Goal: Risk for activity intolerance will decrease Outcome: Progressing   Problem: Nutrition: Goal: Adequate nutrition will be maintained Outcome: Progressing   Problem: Pain Managment: Goal: General experience of comfort will improve Outcome: Progressing   Problem: Safety: Goal: Ability to remain free from injury will improve Outcome: Progressing   Problem: Skin Integrity: Goal: Risk for impaired skin integrity will decrease Outcome: Progressing

## 2022-09-13 NOTE — Progress Notes (Signed)
[facility] PROGRESS NOTE    Progress Note  Peter [name]  QMV:[id number] DOB: Aug 12, 1980 DOA: 09/12/2022 PCP: Pcp, No     Brief Narrative:   Peter [name] is an 42 y.o. male past medical history significant for alcohol abuse, essential hypertension, diabetes mellitus with prior DKA's admitted for hyperglycemia and uncontrolled hypertension and abnormal liver function test.  Of note patient has lost health insurance.    Assessment/Plan:   Elevated LFT's: Right upper quadrant ultrasound distended gallbladder with stones and sludge, biliary tree was dilated, common bile duct measures 12 mm, no wall thickening. Elevation in LFTs look second obstructive pattern Acute hepatitis panel negative SARS-CoV-2 and influenza PCR negative. Check an MRCP. LFTs patterns is not 2-1 AST to ALT unlikely alcohol consumption.  This seems more choledocholithiasis picture like.  Alcohol abuse: With a recent binge drink on 09/11/2022. Continue thiamine and folate. Continue Librium protocol. Monitor for signs of withdrawal. Continue IV fluids. Currently no signs of withdrawal.  Uncontrolled type 2 diabetes mellitus with hyperglycemia, without long-term current use of insulin (HCC) A1c of 8.9, on admission he was hyperglycemic placed n.p.o. on sliding scale his blood glucose this morning is 269 continue CBGs every 4 and sliding scale hold all oral hypoglycemic agents.  Hypovolemic hyponatremia: Continue IV fluids recheck basic metabolic panel tomorrow morning.  Uncontrolled hypertension: Continue amlodipine and clonidine. Hydralazine IV as needed.  Blood shows relatively control.  DVT prophylaxis: lovenox Family Communication:none Status is: Observation The patient will require care spanning > 2 midnights and should be moved to inpatient because: Acute abdominal pain likely due to choledocholithiasis    Code Status:     Code Status Orders  (From admission, onward)            Start     Ordered   09/12/22 1755  Full code  Continuous       Question:  By:  Answer:  Consent: discussion documented in EHR   09/12/22 1756           Code Status History     Date Active Date Inactive Code Status Order ID Comments User Context   10/29/2020 2326 10/31/2020 1746 Full Code [id number]  Rometta Emery, MD ED   10/29/2020 1738 10/29/2020 2326 Full Code [id number]  Placido Sou, PA-C ED   04/04/2020 1154 04/15/2020 1706 Full Code [id number]  Jae Dire, MD ED   01/21/2018 2106 01/23/2018 2101 Full Code [id number]  Teryl Lucy, MD ED   05/20/2017 2023 05/21/2017 1840 Full Code [id number]  Elder Love, MD ED   05/20/2017 1804 05/20/2017 2022 Full Code [id number]  Blane Ohara, MD ED   02/15/2017 0222 02/16/2017 2329 Full Code [id number]  Delano Metz, MD ED   02/14/2017 1413 02/15/2017 0222 Full Code [id number]  Georgiana Shore, PA-C ED   12/02/2016 2021 12/07/2016 1724 Full Code [id number]  Clydie Braun, MD ED         IV Access:   Peripheral IV   Procedures and diagnostic studies:   US Abdomen Limited RUQ (LIVER/GB)  Result Date: 09/12/2022 CLINICAL DATA:  Hyperbilirubinemia EXAM: ULTRASOUND ABDOMEN LIMITED RIGHT UPPER QUADRANT COMPARISON:  None Available. FINDINGS: Gallbladder: Distended gallbladder. Small stones are seen measuring up to 9 mm. Some sludge as well. No wall thickening or adjacent fluid. No reported sonographic Murphy's sign. Common bile duct: Diameter: 12 mm.  Abnormally enlarged. Liver: Mildly echogenic hepatic parenchyma consistent with fatty liver infiltration. Mild intrahepatic biliary ductal dilatation as well. Portal vein  is patent on color Doppler imaging with normal direction of blood flow towards the liver. Other: None. IMPRESSION: Distended gallbladder with stones and some sludge. There is a dilated biliary tree. Etiology is uncertain. If there is no known history recommend further workup when appropriate such as MRCP  Electronically Signed   By: Karen Kays M.D.   On: 09/12/2022 17:54   CT Angio Chest PE W and/or Wo Contrast  Result Date: 09/12/2022 CLINICAL DATA:  Pulmonary embolism (PE) suspected, low to intermediate prob, positive D-dimer EXAM: CT ANGIOGRAPHY CHEST WITH CONTRAST TECHNIQUE: Multidetector CT imaging of the chest was performed using the standard protocol during bolus administration of intravenous contrast. Multiplanar CT image reconstructions and MIPs were obtained to evaluate the vascular anatomy. RADIATION DOSE REDUCTION: This exam was performed according to the departmental dose-optimization program which includes automated exposure control, adjustment of the mA and/or kV according to patient size and/or use of iterative reconstruction technique. CONTRAST:  75mL OMNIPAQUE IOHEXOL 350 MG/ML SOLN COMPARISON:  Chest XR, concurrent.  CT AP, 04/11/2020. FINDINGS: Cardiovascular: Satisfactory opacification of the pulmonary arteries to the segmental level. No evidence of pulmonary embolism. Normal heart size. No pericardial effusion. Mediastinum/Nodes: No enlarged mediastinal, hilar, or axillary lymph nodes. Thyroid gland, trachea, and esophagus demonstrate no significant findings. Lungs/Pleura: Lungs are clear without focal consolidation, mass or suspicious pulmonary nodule. No pleural effusion or pneumothorax. Upper Abdomen: No acute abnormality. Similar appearance of fullness at the pancreatic head and uncinate. Musculoskeletal: No acute chest wall abnormality. No acute osseous findings. Review of the MIP images confirms the above findings. IMPRESSION: 1. No segmental or larger pulmonary embolus. 2. No acute intrathoracic vascular or nonvascular findings. Electronically Signed   By: Roanna Banning M.D.   On: 09/12/2022 16:22   DG Chest 2 View  Result Date: 09/12/2022 CLINICAL DATA:  Tachycardia and elevated blood pressure. EXAM: CHEST - 2 VIEW COMPARISON:  April 09, 2020 FINDINGS: The heart size and  mediastinal contours are within normal limits. Both lungs are clear. The visualized skeletal structures are unremarkable. IMPRESSION: No active cardiopulmonary disease. Electronically Signed   By: Aram Candela M.D.   On: 09/12/2022 15:45     Medical Consultants:   None.   Subjective:    Peter [name] relates he is having abdominal pain  Objective:    Vitals:   09/13/22 0017 09/13/22 0200 09/13/22 0300 09/13/22 0400  BP:  (!) 139/92 138/85 (!) 127/93  Pulse:  84 90 90  Resp:  20 20 19   Temp: 98.2 F (36.8 C)  97.7 F (36.5 C)   TempSrc: Oral  Oral   SpO2:  99% 98% 98%  Weight:      Height:       SpO2: 98 %   Intake/Output Summary (Last 24 hours) at 09/13/2022 0723 Last data filed at 09/12/2022 1530 Gross per 24 hour  Intake 1000 ml  Output --  Net 1000 ml   Filed Weights   09/12/22 1320 09/12/22 1841  Weight: 83.9 kg 81 kg    Exam: General exam: In no acute distress. Respiratory system: Good air movement and clear to auscultation. Cardiovascular system: S1 & S2 heard, RRR. No JVD.  Gastrointestinal system: Abdomen is nondistended, soft and nontender.  Murphy sign negative Extremities: No pedal edema. Skin: No rashes, lesions or ulcers Psychiatry: Judgement and insight appear normal. Mood & affect appropriate.    Data Reviewed:    Labs: Basic Metabolic Panel: Recent Labs  Lab 09/12/22 1341 09/13/22 0302  NA 130* 130*  K 3.7 3.6  CL 92* 94*  CO2 22 25  GLUCOSE 445* 269*  BUN 7 7  CREATININE 0.78 0.73  CALCIUM 9.3 8.8*   GFR Estimated Creatinine Clearance: 125.5 mL/min (by C-G formula based on SCr of 0.73 mg/dL). Liver Function Tests: Recent Labs  Lab 09/12/22 1341 09/13/22 0302  AST 675* 491*  ALT 480* 440*  ALKPHOS 342* 299*  BILITOT 5.3* 7.2*  PROT 7.9 7.0  ALBUMIN 3.8 3.4*   No results for input(s): "LIPASE", "AMYLASE" in the last 168 hours. No results for input(s): "AMMONIA" in the last 168 hours. Coagulation  profile Recent Labs  Lab 09/12/22 1529 09/13/22 0302  INR 1.0 1.1   COVID-19 Labs  Recent Labs    09/12/22 1341  DDIMER 0.71*    Lab Results  Component Value Date   SARSCOV2NAA NEGATIVE 09/12/2022   SARSCOV2NAA NEGATIVE 10/30/2020   SARSCOV2NAA NEGATIVE 04/04/2020    CBC: Recent Labs  Lab 09/12/22 1341 09/13/22 0302  WBC 9.1 6.7  NEUTROABS 7.6  --   HGB 16.8 15.1  HCT 45.9 42.4  MCV 94.6 98.1  PLT 165 144*   Cardiac Enzymes: No results for input(s): "CKTOTAL", "CKMB", "CKMBINDEX", "TROPONINI" in the last 168 hours. BNP (last 3 results) No results for input(s): "PROBNP" in the last 8760 hours. CBG: Recent Labs  Lab 09/12/22 2144  GLUCAP 287*   D-Dimer: Recent Labs    09/12/22 1341  DDIMER 0.71*   Hgb A1c: Recent Labs    09/12/22 1934  HGBA1C 8.9*   Lipid Profile: No results for input(s): "CHOL", "HDL", "LDLCALC", "TRIG", "CHOLHDL", "LDLDIRECT" in the last 72 hours. Thyroid function studies: No results for input(s): "TSH", "T4TOTAL", "T3FREE", "THYROIDAB" in the last 72 hours.  Invalid input(s): "FREET3" Anemia work up: No results for input(s): "VITAMINB12", "FOLATE", "FERRITIN", "TIBC", "IRON", "RETICCTPCT" in the last 72 hours. Sepsis Labs: Recent Labs  Lab 09/12/22 1341 09/12/22 1529 09/13/22 0302  WBC 9.1  --  6.7  LATICACIDVEN  --  1.5  --    Microbiology Recent Results (from the past 240 hour(s))  Resp panel by RT-PCR (RSV, Flu A&B, Covid) Anterior Nasal Swab     Status: None   Collection Time: 09/12/22  1:24 PM   Specimen: Anterior Nasal Swab  Result Value Ref Range Status   SARS Coronavirus 2 by RT PCR NEGATIVE NEGATIVE Final    Comment: (NOTE) SARS-CoV-2 target nucleic acids are NOT DETECTED.  The SARS-CoV-2 RNA is generally detectable in upper respiratory specimens during the acute phase of infection. The lowest concentration of SARS-CoV-2 viral copies this assay can detect is 138 copies/mL. A negative result does not  preclude SARS-Cov-2 infection and should not be used as the sole basis for treatment or other patient management decisions. A negative result may occur with  improper specimen collection/handling, submission of specimen other than nasopharyngeal swab, presence of viral mutation(s) within the areas targeted by this assay, and inadequate number of viral copies(<138 copies/mL). A negative result must be combined with clinical observations, patient history, and epidemiological information. The expected result is Negative.  Fact Sheet for Patients:  BloggerCourse.com  Fact Sheet for Healthcare Providers:  SeriousBroker.it  This test is no t yet approved or cleared by the [place] FDA and  has been authorized for detection and/or diagnosis of SARS-CoV-2 by FDA under an Emergency Use Authorization (EUA). This EUA will remain  in effect (meaning this test can be used) for the duration of the COVID-19  declaration under Section 564(b)(1) of the Act, 21 U.S.C.section 360bbb-3(b)(1), unless the authorization is terminated  or revoked sooner.       Influenza A by PCR NEGATIVE NEGATIVE Final   Influenza B by PCR NEGATIVE NEGATIVE Final    Comment: (NOTE) The Xpert Xpress SARS-CoV-2/FLU/RSV plus assay is intended as an aid in the diagnosis of influenza from Nasopharyngeal swab specimens and should not be used as a sole basis for treatment. Nasal washings and aspirates are unacceptable for Xpert Xpress SARS-CoV-2/FLU/RSV testing.  Fact Sheet for Patients: BloggerCourse.com  Fact Sheet for Healthcare Providers: SeriousBroker.it  This test is not yet approved or cleared by the [place] FDA and has been authorized for detection and/or diagnosis of SARS-CoV-2 by FDA under an Emergency Use Authorization (EUA). This EUA will remain in effect (meaning this test can be used) for the  duration of the COVID-19 declaration under Section 564(b)(1) of the Act, 21 U.S.C. section 360bbb-3(b)(1), unless the authorization is terminated or revoked.     Resp Syncytial Virus by PCR NEGATIVE NEGATIVE Final    Comment: (NOTE) Fact Sheet for Patients: BloggerCourse.com  Fact Sheet for Healthcare Providers: SeriousBroker.it  This test is not yet approved or cleared by the [place] FDA and has been authorized for detection and/or diagnosis of SARS-CoV-2 by FDA under an Emergency Use Authorization (EUA). This EUA will remain in effect (meaning this test can be used) for the duration of the COVID-19 declaration under Section 564(b)(1) of the Act, 21 U.S.C. section 360bbb-3(b)(1), unless the authorization is terminated or revoked.  Performed at [facility], [address] [place], [place] [address]   MRSA Next Gen by PCR, Nasal     Status: None   Collection Time: 09/12/22  6:51 PM   Specimen: Nasal Mucosa; Nasal Swab  Result Value Ref Range Status   MRSA by PCR Next Gen NOT DETECTED NOT DETECTED Final    Comment: (NOTE) The GeneXpert MRSA Assay (FDA approved for NASAL specimens only), is one component of a comprehensive MRSA colonization surveillance program. It is not intended to diagnose MRSA infection nor to guide or monitor treatment for MRSA infections. Test performance is not FDA approved in patients less than 46 years old. Performed at [facility], [address], [place], [place] [address]      Medications:    amLODipine  10 mg Oral Daily   chlordiazePOXIDE  25 mg Oral QID   Followed by   Melene Muller ON 09/14/2022] chlordiazePOXIDE  25 mg Oral TID   Followed by   Melene Muller ON 09/15/2022] chlordiazePOXIDE  25 mg Oral BH-qamhs   Followed by   Melene Muller ON 09/16/2022] chlordiazePOXIDE  25 mg Oral Daily   Chlorhexidine Gluconate Cloth  6 each Topical Daily   cloNIDine  0.2 mg  Oral BID   enoxaparin (LOVENOX) injection  40 mg Subcutaneous Q24H   folic acid  1 mg Oral Daily   insulin aspart  0-15 Units Subcutaneous TID WC   insulin aspart  0-5 Units Subcutaneous QHS   multivitamin with minerals  1 tablet Oral Daily   thiamine  100 mg Oral Daily   Or   thiamine  100 mg Intravenous Daily   Continuous Infusions:    LOS: 0 days   Marinda Elk  [facility]  09/13/2022, 7:23 AM

## 2022-09-14 ENCOUNTER — Encounter (HOSPITAL_COMMUNITY): Payer: Self-pay | Admitting: Internal Medicine

## 2022-09-14 LAB — GLUCOSE, CAPILLARY
Glucose-Capillary: 237 mg/dL — ABNORMAL HIGH (ref 70–99)
Glucose-Capillary: 287 mg/dL — ABNORMAL HIGH (ref 70–99)
Glucose-Capillary: 159 mg/dL — ABNORMAL HIGH (ref 70–99)
Glucose-Capillary: 271 mg/dL — ABNORMAL HIGH (ref 70–99)

## 2022-09-14 LAB — COMPREHENSIVE METABOLIC PANEL
AST: 458 U/L — ABNORMAL HIGH (ref 15–41)
Albumin: 3 g/dL — ABNORMAL LOW (ref 3.5–5.0)
Alkaline Phosphatase: 278 U/L — ABNORMAL HIGH (ref 38–126)
Anion gap: 11 (ref 5–15)
BUN: 10 mg/dL (ref 6–20)
CO2: 24 mmol/L (ref 22–32)
Calcium: 8.6 mg/dL — ABNORMAL LOW (ref 8.9–10.3)
Creatinine, Ser: 0.79 mg/dL (ref 0.61–1.24)
GFR, Estimated: >60 mL/min (ref >60)
Glucose, Bld: 215 mg/dL — ABNORMAL HIGH (ref 70–99)
Sodium: 130 mmol/L — ABNORMAL LOW (ref 135–145)
Total Protein: 6.3 g/dL — ABNORMAL LOW (ref 6.5–8.1)

## 2022-09-14 MED ORDER — INSULIN DETEMIR 100 UNIT/ML ~~LOC~~ SOLN
5.0000 [IU] | Freq: Two times a day (BID) | SUBCUTANEOUS | Status: DC
  Administered 2022-09-14 – 2022-09-15 (×3): 5 [IU] via SUBCUTANEOUS
  Filled 2022-09-14 (×4): qty 0.05

## 2022-09-14 NOTE — Inpatient Diabetes Management (Addendum)
Inpatient Diabetes Program Recommendations  AACE/ADA: New Consensus Statement on Inpatient Glycemic Control (2015)  Target Ranges:  Prepandial:   less than 140 mg/dL      Peak postprandial:   less than 180 mg/dL (1-2 hours)      Critically ill patients:  140 - 180 mg/dL    Latest Reference Range & Units 09/12/22 19:34  Hemoglobin A1C 4.8 - 5.6 % 8.9 (H)  208 mg/dl  (H): Data is abnormally high   Latest Reference Range & Units 09/13/22 07:54 09/13/22 12:14 09/13/22 15:27 09/13/22 19:57  Glucose-Capillary 70 - 99 mg/dL 401 (H)  8 units Novolog  217 (H)  5 units Novolog  194 (H)  3 units Novolog  211 (H)  5 units Novolog   (H): Data is abnormally high   Latest Reference Range & Units 09/14/22 07:52  Glucose-Capillary 70 - 99 mg/dL 027 (H)  (H): Data is abnormally high    Admit with: High blood pressure and Elevated LFTs/ Recurrent pancreatitis   History: DM2, ETOH Abuse  Home DM Meds: Metformin 1000 mg BID (NOT taking)       Glipizide 5 mg BID (NOT taking)  Current Orders: Novolog Moderate Correction Scale/ SSI (0-15 units) TID AC     NOT taking Medications due to loss of insurance   MD- Note AM CBGs >200 the last 2 days.  Please consider starting Semglee 8 units Daily (0.1 units/kg)  Note pt has NOT been taking DM meds at home due to loss of insurance--Have placed TOC consult.    Metformin may not be best choice for pt at home given elevated LFTs and hx of ETOH Abuse  Please renew pt's Rx for Glipizide (can get at [facility] for $4)    Addendum 11am--Met w/ pt at bedside to review CBGs and home DM med regimen.  Pt told me he lost his health insurance about 1 year ago.  Has started new job and will be eligible for insurance but not until Nov 1st of this year.  Ran out of Metformin and Glipizide about 6 mos ago.  Has CBG meter at home but not checking CBGs.  Told me he lost about 50-60 pounds and thought his diabetes may have improved with his weight loss.  We  reviewed his current A1c of 8.9% and how this A1c shows his CBG trends have been in the low 200s.  Discussed with pt that he likely needs some sore of diabetes medication to help get CBGs under better control.  We reviewed goal CBGs and goal A1c.  Discussed with pt that the discharging MD will ultimately decide what meds to send him home on--However, did discuss with pt that Metformin may not be a good option given his elevated LFTs.  We talked about the possibility of restarting Glipizide--I did educate pt that he can get Glipizide at [facility] for $4 with a Rx.  Discussed ws/ pt that I have placed a TOC consult to help pt get meds and care until his insurance kicks in later this year.  Discussed w/ pt the importance of establishing with PCP for ongoing diabetes care.  Also reminded pt about the importance of good CBG control to prevent long-term and short term DM complications.  Pt told me he has a CBG meter at home and can afford the strips out of pocket.  Asked pt to please check CBGs at least Daily for the next months to assess how the discharge meds are working.  Pt appreciative  of visit and did not have any further questions for me.     --Will follow patient during hospitalization--  Ambrose Finland RN, MSN, CDCES Diabetes Coordinator Inpatient Glycemic Control Team Team Pager: [phone] (8a-5p)

## 2022-09-14 NOTE — Progress Notes (Signed)
Subjective: Mild upper abdominal pain.  Objective: Vital signs in last 24 hours: Temp:  [97.4 F (36.3 C)-98.5 F (36.9 C)] 97.9 F (36.6 C) (07/25 0835) Pulse Rate:  [78-84] 80 (07/25 0835) Resp:  [14-20] 16 (07/25 0835) BP: (96-125)/(64-84) 122/84 (07/25 0835) SpO2:  [95 %-99 %] 99 % (07/25 0835) Weight change:  Last BM Date : 09/12/22  PE: GEN:  NAD  NEURO:  No encephalopathy HEENT:  Scleral icterus SKIN:  Jaundiced ABD:  Soft, mild epigastric tenderness without peritonitis  Lab Results: CBC    Component Value Date/Time   WBC 6.7 09/13/2022 0302   RBC 4.32 09/13/2022 0302   HGB 15.1 09/13/2022 0302   HGB 15.9 12/03/2017 1607   HCT 42.4 09/13/2022 0302   HCT 45.8 12/03/2017 1607   PLT 144 (L) 09/13/2022 0302   PLT 389 12/03/2017 1607   MCV 98.1 09/13/2022 0302   MCV 91 12/03/2017 1607   MCH 35.0 (H) 09/13/2022 0302   MCHC 35.6 09/13/2022 0302   RDW 12.5 09/13/2022 0302   RDW 12.6 12/03/2017 1607   LYMPHSABS 0.8 09/12/2022 1341   LYMPHSABS 1.9 12/03/2017 1607   MONOABS 0.5 09/12/2022 1341   EOSABS 0.0 09/12/2022 1341   EOSABS 0.2 12/03/2017 1607   BASOSABS 0.0 09/12/2022 1341   BASOSABS 0.1 12/03/2017 1607  CMP     Component Value Date/Time   NA 130 (L) 09/14/2022 0518   NA 134 06/01/2020 1143   K 4.4 09/14/2022 0518   CL 95 (L) 09/14/2022 0518   CO2 24 09/14/2022 0518   GLUCOSE 215 (H) 09/14/2022 0518   BUN 10 09/14/2022 0518   BUN 8 06/01/2020 1143   CREATININE 0.79 09/14/2022 0518   CREATININE 1.16 11/13/2016 1229   CALCIUM 8.6 (L) 09/14/2022 0518   PROT 6.3 (L) 09/14/2022 0518   PROT 7.2 06/01/2020 1143   ALBUMIN 3.0 (L) 09/14/2022 0518   ALBUMIN 4.2 06/01/2020 1143   AST 458 (H) 09/14/2022 0518   ALT 396 (H) 09/14/2022 0518   ALKPHOS 278 (H) 09/14/2022 0518   BILITOT 5.4 (H) 09/14/2022 0518   BILITOT 0.3 06/01/2020 1143   EGFR 113 06/01/2020 1143   GFRNONAA >60 09/14/2022 0518   GFRNONAA 81 11/13/2016 1229   Assessment:    Pancreatitis, mild, improving. Obstructive jaundice with double duct sign. 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  Plan:   Diet as tolerated today, NPO after midnight. Endoscopic ultrasound with possible fine needle aspiration, followed by endoscopic retrograde cholangiopancreatography with possible biliary stent placement, to be done tomorrow (no anesthesia availability today). Risks (bleeding, infection, bowel perforation that could require surgery, sedation-related changes in cardiopulmonary systems), benefits (identification and possible treatment of source of symptoms, exclusion of certain causes of symptoms), and alternatives (watchful waiting, radiographic imaging studies, empiric medical treatment) of upper endoscopy with ultrasound and possible fine needle aspiration (EUS +/- FNA) were explained to patient/family in detail and patient wishes to proceed.  Risks (up to and including bleeding, infection, perforation, pancreatitis that can be complicated by infected necrosis and death), benefits (removal of stones, alleviating blockage, decreasing risk of cholangitis or choledocholithiasis-related pancreatitis), and alternatives (watchful waiting, percutaneous transhepatic cholangiography) of ERCP were explained to patient/family in detail and patient elects to proceed.    Peter [name] 09/14/2022, 10:04 AM   Cell [phone] If no answer or after 5 PM call [phone]

## 2022-09-14 NOTE — Progress Notes (Signed)
[facility] PROGRESS NOTE    Progress Note  Peter [name]  GNF:[id number] DOB: Dec 02, 1980 DOA: 09/12/2022 PCP: Pcp, No     Brief Narrative:   Peter [name] is an 42 y.o. male past medical history significant for alcohol abuse, essential hypertension, diabetes mellitus with prior DKA's admitted for hyperglycemia and uncontrolled hypertension and abnormal liver function test.  Of note patient has lost health insurance. Right upper quadrant ultrasound distended gallbladder with stones and sludge, biliary tree was dilated, common bile duct measures 12 mm, no wall thickening.    Assessment/Plan:   Elevated LFT's: Allow a diet n.p.o. after midnight. MRCP showed 2 point centimeter hypervascular lesion in the head of the pancreas obstructing the portal vein confluence with splenic thrombosis and focal segmental pancreatitis. GI was consulted who recommended a ERCP with endoscopic ultrasound fine-needle aspiration. LFTs continue to be elevated.  Alcohol abuse: With a recent binge drink on 09/11/2022. Continue thiamine and folate. Continue Librium protocol. Monitor for signs of withdrawal. Currently no signs of withdrawal.  Uncontrolled type 2 diabetes mellitus with hyperglycemia, without long-term current use of insulin (HCC) A1c of 8.9, on admission he was hyperglycemic. Continue sliding scale insulin will add long-acting insulin.  Hypovolemic hyponatremia: KVO IV fluids.  Uncontrolled hypertension: Continue amlodipine and clonidine. Hydralazine IV as needed.  Blood shows relatively control.  DVT prophylaxis: lovenox Family Communication:none Status is: Observation The patient will require care spanning > 2 midnights and should be moved to inpatient because: Acute abdominal pain likely due to choledocholithiasis    Code Status:     Code Status Orders  (From admission, onward)           Start     Ordered   09/12/22 1755  Full code  Continuous        Question:  By:  Answer:  Consent: discussion documented in EHR   09/12/22 1756           Code Status History     Date Active Date Inactive Code Status Order ID Comments User Context   10/29/2020 2326 10/31/2020 1746 Full Code [id number]  Rometta Emery, MD ED   10/29/2020 1738 10/29/2020 2326 Full Code [id number]  Placido Sou, PA-C ED   04/04/2020 1154 04/15/2020 1706 Full Code [id number]  Jae Dire, MD ED   01/21/2018 2106 01/23/2018 2101 Full Code [id number]  Teryl Lucy, MD ED   05/20/2017 2023 05/21/2017 1840 Full Code [id number]  Elder Love, MD ED   05/20/2017 1804 05/20/2017 2022 Full Code [id number]  Blane Ohara, MD ED   02/15/2017 0222 02/16/2017 2329 Full Code [id number]  Delano Metz, MD ED   02/14/2017 1413 02/15/2017 0222 Full Code [id number]  Georgiana Shore, PA-C ED   12/02/2016 2021 12/07/2016 1724 Full Code [id number]  Clydie Braun, MD ED         IV Access:   Peripheral IV   Procedures and diagnostic studies:   MR ABDOMEN MRCP W WO CONTAST  Result Date: 09/13/2022 CLINICAL DATA:  Abdominal pain. Acute pancreatitis. Elevated liver function tests. EXAM: MRI ABDOMEN WITHOUT AND WITH CONTRAST (INCLUDING MRCP) TECHNIQUE: Multiplanar multisequence MR imaging of the abdomen was performed both before and after the administration of intravenous contrast. Heavily T2-weighted images of the biliary and pancreatic ducts were obtained, and three-dimensional MRCP images were rendered by post processing. CONTRAST:  8mL GADAVIST GADOBUTROL 1 MMOL/ML IV SOLN COMPARISON:  04/12/2020 FINDINGS: Lower chest: No acute findings. Hepatobiliary: No hepatic masses identified. Gallbladder is  distended and shows several tiny gallstones. Mild diffuse gallbladder wall thickening is seen, and cholecystitis cannot be excluded. Diffuse intra and extrahepatic biliary ductal dilatation is seen, with common bile duct measuring 11 mm in diameter. Stricture of the distal common bile duct  is seen, without evidence of choledocholithiasis. Pancreas: Mild pancreatic ductal dilatation is seen. An ill-defined hypovascular lesion is seen in the pancreatic head measuring 2.7 x 2.1 cm on image 54/16. Is mild edema seen adjacent to the pancreatic head and within the porta hepatis. This raises suspicion for pancreatic carcinoma, although differential diagnosis still includes focal pancreatitis. This also encases and obstructs the portal venous confluence, and causes splenic vein thrombosis. Spleen:  Within normal limits in size and appearance. Adrenals/Urinary Tract: No suspicious masses identified. No evidence of hydronephrosis. Stomach/Bowel: Unremarkable. Vascular/Lymphatic: No pathologically enlarged lymph nodes identified. No acute vascular findings. Other:  None. Musculoskeletal:  No suspicious bone lesions identified. IMPRESSION: 2.7 cm ill-defined hypovascular lesion in the pancreatic head, which encases and obstructs the portal venous confluence, and causes splenic vein thrombosis. This is suspicious for pancreatic carcinoma, focal pancreatitis considered less likely. EUS/FNA should be considered for further evaluation. Diffuse biliary and pancreatic ductal dilatation. Cholelithiasis. Mild diffuse gallbladder wall thickening and distention also noted. Cholecystitis cannot be excluded. Consider nuclear medicine hepatobiliary scan if clinically warranted. Electronically Signed   By: Danae Orleans M.D.   On: 09/13/2022 12:17   MR 3D Recon At Scanner  Result Date: 09/13/2022 CLINICAL DATA:  Abdominal pain. Acute pancreatitis. Elevated liver function tests. EXAM: MRI ABDOMEN WITHOUT AND WITH CONTRAST (INCLUDING MRCP) TECHNIQUE: Multiplanar multisequence MR imaging of the abdomen was performed both before and after the administration of intravenous contrast. Heavily T2-weighted images of the biliary and pancreatic ducts were obtained, and three-dimensional MRCP images were rendered by post processing.  CONTRAST:  8mL GADAVIST GADOBUTROL 1 MMOL/ML IV SOLN COMPARISON:  04/12/2020 FINDINGS: Lower chest: No acute findings. Hepatobiliary: No hepatic masses identified. Gallbladder is distended and shows several tiny gallstones. Mild diffuse gallbladder wall thickening is seen, and cholecystitis cannot be excluded. Diffuse intra and extrahepatic biliary ductal dilatation is seen, with common bile duct measuring 11 mm in diameter. Stricture of the distal common bile duct is seen, without evidence of choledocholithiasis. Pancreas: Mild pancreatic ductal dilatation is seen. An ill-defined hypovascular lesion is seen in the pancreatic head measuring 2.7 x 2.1 cm on image 54/16. Is mild edema seen adjacent to the pancreatic head and within the porta hepatis. This raises suspicion for pancreatic carcinoma, although differential diagnosis still includes focal pancreatitis. This also encases and obstructs the portal venous confluence, and causes splenic vein thrombosis. Spleen:  Within normal limits in size and appearance. Adrenals/Urinary Tract: No suspicious masses identified. No evidence of hydronephrosis. Stomach/Bowel: Unremarkable. Vascular/Lymphatic: No pathologically enlarged lymph nodes identified. No acute vascular findings. Other:  None. Musculoskeletal:  No suspicious bone lesions identified. IMPRESSION: 2.7 cm ill-defined hypovascular lesion in the pancreatic head, which encases and obstructs the portal venous confluence, and causes splenic vein thrombosis. This is suspicious for pancreatic carcinoma, focal pancreatitis considered less likely. EUS/FNA should be considered for further evaluation. Diffuse biliary and pancreatic ductal dilatation. Cholelithiasis. Mild diffuse gallbladder wall thickening and distention also noted. Cholecystitis cannot be excluded. Consider nuclear medicine hepatobiliary scan if clinically warranted. Electronically Signed   By: Danae Orleans M.D.   On: 09/13/2022 12:17   US Abdomen  Limited RUQ (LIVER/GB)  Result Date: 09/12/2022 CLINICAL DATA:  Hyperbilirubinemia EXAM: ULTRASOUND ABDOMEN LIMITED RIGHT UPPER QUADRANT  COMPARISON:  None Available. FINDINGS: Gallbladder: Distended gallbladder. Small stones are seen measuring up to 9 mm. Some sludge as well. No wall thickening or adjacent fluid. No reported sonographic Murphy's sign. Common bile duct: Diameter: 12 mm.  Abnormally enlarged. Liver: Mildly echogenic hepatic parenchyma consistent with fatty liver infiltration. Mild intrahepatic biliary ductal dilatation as well. Portal vein is patent on color Doppler imaging with normal direction of blood flow towards the liver. Other: None. IMPRESSION: Distended gallbladder with stones and some sludge. There is a dilated biliary tree. Etiology is uncertain. If there is no known history recommend further workup when appropriate such as MRCP Electronically Signed   By: Karen Kays M.D.   On: 09/12/2022 17:54   CT Angio Chest PE W and/or Wo Contrast  Result Date: 09/12/2022 CLINICAL DATA:  Pulmonary embolism (PE) suspected, low to intermediate prob, positive D-dimer EXAM: CT ANGIOGRAPHY CHEST WITH CONTRAST TECHNIQUE: Multidetector CT imaging of the chest was performed using the standard protocol during bolus administration of intravenous contrast. Multiplanar CT image reconstructions and MIPs were obtained to evaluate the vascular anatomy. RADIATION DOSE REDUCTION: This exam was performed according to the departmental dose-optimization program which includes automated exposure control, adjustment of the mA and/or kV according to patient size and/or use of iterative reconstruction technique. CONTRAST:  75mL OMNIPAQUE IOHEXOL 350 MG/ML SOLN COMPARISON:  Chest XR, concurrent.  CT AP, 04/11/2020. FINDINGS: Cardiovascular: Satisfactory opacification of the pulmonary arteries to the segmental level. No evidence of pulmonary embolism. Normal heart size. No pericardial effusion. Mediastinum/Nodes: No  enlarged mediastinal, hilar, or axillary lymph nodes. Thyroid gland, trachea, and esophagus demonstrate no significant findings. Lungs/Pleura: Lungs are clear without focal consolidation, mass or suspicious pulmonary nodule. No pleural effusion or pneumothorax. Upper Abdomen: No acute abnormality. Similar appearance of fullness at the pancreatic head and uncinate. Musculoskeletal: No acute chest wall abnormality. No acute osseous findings. Review of the MIP images confirms the above findings. IMPRESSION: 1. No segmental or larger pulmonary embolus. 2. No acute intrathoracic vascular or nonvascular findings. Electronically Signed   By: Roanna Banning M.D.   On: 09/12/2022 16:22   DG Chest 2 View  Result Date: 09/12/2022 CLINICAL DATA:  Tachycardia and elevated blood pressure. EXAM: CHEST - 2 VIEW COMPARISON:  April 09, 2020 FINDINGS: The heart size and mediastinal contours are within normal limits. Both lungs are clear. The visualized skeletal structures are unremarkable. IMPRESSION: No active cardiopulmonary disease. Electronically Signed   By: Aram Candela M.D.   On: 09/12/2022 15:45     Medical Consultants:   None.   Subjective:    Peter [name] no complaints feels well.  Objective:    Vitals:   09/13/22 1942 09/13/22 2350 09/14/22 0346 09/14/22 0835  BP: 125/82 106/69 96/64 122/84  Pulse: 84 81 78 80  Resp: 14 14 14 16   Temp: 98.1 F (36.7 C) 98 F (36.7 C) 98.1 F (36.7 C) 97.9 F (36.6 C)  TempSrc: Oral Oral Oral Oral  SpO2: 98% 97% 98% 99%  Weight:      Height:       SpO2: 99 %   Intake/Output Summary (Last 24 hours) at 09/14/2022 1019 Last data filed at 09/14/2022 0541 Gross per 24 hour  Intake 1576.78 ml  Output --  Net 1576.78 ml   Filed Weights   09/12/22 1320 09/12/22 1841  Weight: 83.9 kg 81 kg    Exam: General exam: In no acute distress. Respiratory system: Good air movement and clear to auscultation. Cardiovascular system:  S1 & S2 heard,  RRR. No JVD. Gastrointestinal system: Abdomen is nondistended, soft and nontender.  Extremities: No pedal edema. Skin: No rashes, lesions or ulcers Psychiatry: Judgement and insight appear normal. Mood & affect appropriate.  Data Reviewed:    Labs: Basic Metabolic Panel: Recent Labs  Lab 09/12/22 1341 09/13/22 0302 09/14/22 0518  NA 130* 130* 130*  K 3.7 3.6 4.4  CL 92* 94* 95*  CO2 22 25 24   GLUCOSE 445* 269* 215*  BUN 7 7 10   CREATININE 0.78 0.73 0.79  CALCIUM 9.3 8.8* 8.6*   GFR Estimated Creatinine Clearance: 125.5 mL/min (by C-G formula based on SCr of 0.79 mg/dL). Liver Function Tests: Recent Labs  Lab 09/12/22 1341 09/13/22 0302 09/14/22 0518  AST 675* 491* 458*  ALT 480* 440* 396*  ALKPHOS 342* 299* 278*  BILITOT 5.3* 7.2* 5.4*  PROT 7.9 7.0 6.3*  ALBUMIN 3.8 3.4* 3.0*   Recent Labs  Lab 09/13/22 0301  LIPASE 103*   No results for input(s): "AMMONIA" in the last 168 hours. Coagulation profile Recent Labs  Lab 09/12/22 1529 09/13/22 0302  INR 1.0 1.1   COVID-19 Labs  Recent Labs    09/12/22 1341  DDIMER 0.71*    Lab Results  Component Value Date   SARSCOV2NAA NEGATIVE 09/12/2022   SARSCOV2NAA NEGATIVE 10/30/2020   SARSCOV2NAA NEGATIVE 04/04/2020    CBC: Recent Labs  Lab 09/12/22 1341 09/13/22 0302  WBC 9.1 6.7  NEUTROABS 7.6  --   HGB 16.8 15.1  HCT 45.9 42.4  MCV 94.6 98.1  PLT 165 144*   Cardiac Enzymes: No results for input(s): "CKTOTAL", "CKMB", "CKMBINDEX", "TROPONINI" in the last 168 hours. BNP (last 3 results) No results for input(s): "PROBNP" in the last 8760 hours. CBG: Recent Labs  Lab 09/13/22 0754 09/13/22 1214 09/13/22 1527 09/13/22 1957 09/14/22 0752  GLUCAP 267* 217* 194* 211* 237*   D-Dimer: Recent Labs    09/12/22 1341  DDIMER 0.71*   Hgb A1c: Recent Labs    09/12/22 1934  HGBA1C 8.9*   Lipid Profile: No results for input(s): "CHOL", "HDL", "LDLCALC", "TRIG", "CHOLHDL", "LDLDIRECT" in  the last 72 hours. Thyroid function studies: No results for input(s): "TSH", "T4TOTAL", "T3FREE", "THYROIDAB" in the last 72 hours.  Invalid input(s): "FREET3" Anemia work up: No results for input(s): "VITAMINB12", "FOLATE", "FERRITIN", "TIBC", "IRON", "RETICCTPCT" in the last 72 hours. Sepsis Labs: Recent Labs  Lab 09/12/22 1341 09/12/22 1529 09/13/22 0302  WBC 9.1  --  6.7  LATICACIDVEN  --  1.5  --    Microbiology Recent Results (from the past 240 hour(s))  Resp panel by RT-PCR (RSV, Flu A&B, Covid) Anterior Nasal Swab     Status: None   Collection Time: 09/12/22  1:24 PM   Specimen: Anterior Nasal Swab  Result Value Ref Range Status   SARS Coronavirus 2 by RT PCR NEGATIVE NEGATIVE Final    Comment: (NOTE) SARS-CoV-2 target nucleic acids are NOT DETECTED.  The SARS-CoV-2 RNA is generally detectable in upper respiratory specimens during the acute phase of infection. The lowest concentration of SARS-CoV-2 viral copies this assay can detect is 138 copies/mL. A negative result does not preclude SARS-Cov-2 infection and should not be used as the sole basis for treatment or other patient management decisions. A negative result may occur with  improper specimen collection/handling, submission of specimen other than nasopharyngeal swab, presence of viral mutation(s) within the areas targeted by this assay, and inadequate number of viral copies(<138 copies/mL). A negative  result must be combined with clinical observations, patient history, and epidemiological information. The expected result is Negative.  Fact Sheet for Patients:  BloggerCourse.com  Fact Sheet for Healthcare Providers:  SeriousBroker.it  This test is no t yet approved or cleared by the [place] FDA and  has been authorized for detection and/or diagnosis of SARS-CoV-2 by FDA under an Emergency Use Authorization (EUA). This EUA will remain  in effect  (meaning this test can be used) for the duration of the COVID-19 declaration under Section 564(b)(1) of the Act, 21 U.S.C.section 360bbb-3(b)(1), unless the authorization is terminated  or revoked sooner.       Influenza A by PCR NEGATIVE NEGATIVE Final   Influenza B by PCR NEGATIVE NEGATIVE Final    Comment: (NOTE) The Xpert Xpress SARS-CoV-2/FLU/RSV plus assay is intended as an aid in the diagnosis of influenza from Nasopharyngeal swab specimens and should not be used as a sole basis for treatment. Nasal washings and aspirates are unacceptable for Xpert Xpress SARS-CoV-2/FLU/RSV testing.  Fact Sheet for Patients: BloggerCourse.com  Fact Sheet for Healthcare Providers: SeriousBroker.it  This test is not yet approved or cleared by the [place] FDA and has been authorized for detection and/or diagnosis of SARS-CoV-2 by FDA under an Emergency Use Authorization (EUA). This EUA will remain in effect (meaning this test can be used) for the duration of the COVID-19 declaration under Section 564(b)(1) of the Act, 21 U.S.C. section 360bbb-3(b)(1), unless the authorization is terminated or revoked.     Resp Syncytial Virus by PCR NEGATIVE NEGATIVE Final    Comment: (NOTE) Fact Sheet for Patients: BloggerCourse.com  Fact Sheet for Healthcare Providers: SeriousBroker.it  This test is not yet approved or cleared by the [place] FDA and has been authorized for detection and/or diagnosis of SARS-CoV-2 by FDA under an Emergency Use Authorization (EUA). This EUA will remain in effect (meaning this test can be used) for the duration of the COVID-19 declaration under Section 564(b)(1) of the Act, 21 U.S.C. section 360bbb-3(b)(1), unless the authorization is terminated or revoked.  Performed at [facility], [address], [place], [place] [address]   MRSA  Next Gen by PCR, Nasal     Status: None   Collection Time: 09/12/22  6:51 PM   Specimen: Nasal Mucosa; Nasal Swab  Result Value Ref Range Status   MRSA by PCR Next Gen NOT DETECTED NOT DETECTED Final    Comment: (NOTE) The GeneXpert MRSA Assay (FDA approved for NASAL specimens only), is one component of a comprehensive MRSA colonization surveillance program. It is not intended to diagnose MRSA infection nor to guide or monitor treatment for MRSA infections. Test performance is not FDA approved in patients less than 49 years old. Performed at [facility], [address], [place], [place] [address]      Medications:    amLODipine  10 mg Oral Daily   chlordiazePOXIDE  25 mg Oral TID   Followed by   Melene Muller ON 09/15/2022] chlordiazePOXIDE  25 mg Oral BH-qamhs   Followed by   Melene Muller ON 09/16/2022] chlordiazePOXIDE  25 mg Oral Daily   cloNIDine  0.2 mg Oral BID   enoxaparin (LOVENOX) injection  40 mg Subcutaneous Q24H   folic acid  1 mg Oral Daily   insulin aspart  0-15 Units Subcutaneous TID WC   multivitamin with minerals  1 tablet Oral Daily   thiamine  100 mg Oral Daily   Or   thiamine  100 mg Intravenous  Daily   Continuous Infusions:    LOS: 1 day   Marinda Elk  [facility]  09/14/2022, 10:19 AM

## 2022-09-15 ENCOUNTER — Inpatient Hospital Stay (HOSPITAL_COMMUNITY): Payer: Self-pay | Admitting: Anesthesiology

## 2022-09-15 ENCOUNTER — Encounter (HOSPITAL_COMMUNITY): Payer: Self-pay | Admitting: Internal Medicine

## 2022-09-15 ENCOUNTER — Encounter (HOSPITAL_COMMUNITY)
Admission: EM | Disposition: A | Discharge status: Home / Self Care | Payer: Self-pay | Source: Home / Self Care | Attending: Internal Medicine

## 2022-09-15 DIAGNOSIS — K8689 Other specified diseases of pancreas: Secondary | ICD-10-CM

## 2022-09-15 HISTORY — PX: EUS: SHX5427

## 2022-09-15 HISTORY — PX: ESOPHAGOGASTRODUODENOSCOPY (EGD) WITH PROPOFOL: SHX5813

## 2022-09-15 HISTORY — PX: FINE NEEDLE ASPIRATION: SHX5430

## 2022-09-15 LAB — GLUCOSE, CAPILLARY
Glucose-Capillary: 255 mg/dL — ABNORMAL HIGH (ref 70–99)
Glucose-Capillary: 224 mg/dL — ABNORMAL HIGH (ref 70–99)
Glucose-Capillary: 172 mg/dL — ABNORMAL HIGH (ref 70–99)
Glucose-Capillary: 349 mg/dL — ABNORMAL HIGH (ref 70–99)

## 2022-09-15 SURGERY — UPPER ENDOSCOPIC ULTRASOUND (EUS) LINEAR
Anesthesia: General

## 2022-09-15 MED ORDER — MIDAZOLAM HCL 2 MG/2ML IJ SOLN
INTRAMUSCULAR | Status: AC
  Filled 2022-09-15: qty 2

## 2022-09-15 MED ORDER — PROPOFOL 10 MG/ML IV BOLUS
INTRAVENOUS | Status: AC
  Filled 2022-09-15: qty 20

## 2022-09-15 MED ORDER — SODIUM CHLORIDE 0.9 % IV SOLN: INTRAVENOUS | Status: DC

## 2022-09-15 MED ORDER — INSULIN DETEMIR 100 UNIT/ML ~~LOC~~ SOLN
10.0000 [IU] | Freq: Two times a day (BID) | SUBCUTANEOUS | Status: DC
  Administered 2022-09-15 – 2022-09-16 (×2): 10 [IU] via SUBCUTANEOUS
  Filled 2022-09-15 (×2): qty 0.1

## 2022-09-15 MED ORDER — LACTATED RINGERS IV SOLN: INTRAVENOUS | Status: DC

## 2022-09-15 MED ORDER — INSULIN ASPART 100 UNIT/ML IJ SOLN
4.0000 [IU] | Freq: Three times a day (TID) | INTRAMUSCULAR | Status: DC
  Administered 2022-09-16: 4 [IU] via SUBCUTANEOUS

## 2022-09-15 MED ORDER — ROCURONIUM BROMIDE 100 MG/10ML IV SOLN
INTRAVENOUS | Status: DC | PRN
  Administered 2022-09-15: 60 mg via INTRAVENOUS

## 2022-09-15 MED ORDER — FENTANYL CITRATE (PF) 100 MCG/2ML IJ SOLN
INTRAMUSCULAR | Status: AC
  Filled 2022-09-15: qty 2

## 2022-09-15 MED ORDER — ONDANSETRON HCL 4 MG/2ML IJ SOLN
INTRAMUSCULAR | Status: DC | PRN
  Administered 2022-09-15: 4 mg via INTRAVENOUS

## 2022-09-15 MED ORDER — CIPROFLOXACIN IN D5W 400 MG/200ML IV SOLN: 400.0000 mg | Freq: Once | INTRAVENOUS | Status: DC

## 2022-09-15 MED ORDER — KETAMINE HCL 10 MG/ML IJ SOLN
INTRAMUSCULAR | Status: AC
  Filled 2022-09-15: qty 1

## 2022-09-15 MED ORDER — GLUCAGON HCL RDNA (DIAGNOSTIC) 1 MG IJ SOLR
INTRAMUSCULAR | Status: AC
  Filled 2022-09-15: qty 1

## 2022-09-15 MED ORDER — MIDAZOLAM HCL 5 MG/5ML IJ SOLN
INTRAMUSCULAR | Status: DC | PRN
  Administered 2022-09-15 (×2): 1 mg via INTRAVENOUS

## 2022-09-15 MED ORDER — INSULIN ASPART 100 UNIT/ML IJ SOLN
0.0000 [IU] | Freq: Three times a day (TID) | INTRAMUSCULAR | Status: DC
  Administered 2022-09-16: 4 [IU] via SUBCUTANEOUS
  Administered 2022-09-16: 11 [IU] via SUBCUTANEOUS
  Administered 2022-09-16: 8 [IU] via SUBCUTANEOUS
  Administered 2022-09-17: 5 [IU] via SUBCUTANEOUS
  Administered 2022-09-17: 8 [IU] via SUBCUTANEOUS
  Administered 2022-09-17 – 2022-09-18 (×2): 3 [IU] via SUBCUTANEOUS
  Administered 2022-09-18: 5 [IU] via SUBCUTANEOUS
  Administered 2022-09-18 – 2022-09-19 (×2): 2 [IU] via SUBCUTANEOUS
  Administered 2022-09-19 (×2): 3 [IU] via SUBCUTANEOUS
  Administered 2022-09-20: 2 [IU] via SUBCUTANEOUS
  Administered 2022-09-20: 5 [IU] via SUBCUTANEOUS
  Administered 2022-09-20 – 2022-09-21 (×2): 2 [IU] via SUBCUTANEOUS

## 2022-09-15 MED ORDER — EPHEDRINE SULFATE (PRESSORS) 50 MG/ML IJ SOLN
INTRAMUSCULAR | Status: DC | PRN
  Administered 2022-09-15: 5 mg via INTRAVENOUS

## 2022-09-15 MED ORDER — FENTANYL CITRATE (PF) 100 MCG/2ML IJ SOLN
INTRAMUSCULAR | Status: DC | PRN
  Administered 2022-09-15: 50 ug via INTRAVENOUS

## 2022-09-15 MED ORDER — DEXAMETHASONE SODIUM PHOSPHATE 10 MG/ML IJ SOLN
INTRAMUSCULAR | Status: DC | PRN
  Administered 2022-09-15: 8 mg via INTRAVENOUS

## 2022-09-15 MED ORDER — INSULIN ASPART 100 UNIT/ML IJ SOLN
0.0000 [IU] | Freq: Every day | INTRAMUSCULAR | Status: DC
  Administered 2022-09-15: 4 [IU] via SUBCUTANEOUS
  Administered 2022-09-16: 2 [IU] via SUBCUTANEOUS

## 2022-09-15 MED ORDER — PHENYLEPHRINE HCL (PRESSORS) 10 MG/ML IV SOLN
INTRAVENOUS | Status: DC | PRN
Start: 2022-09-15 — End: 2022-09-15
  Administered 2022-09-15: 160 ug via INTRAVENOUS

## 2022-09-15 MED ORDER — DICLOFENAC SUPPOSITORY 100 MG
RECTAL | Status: AC
  Filled 2022-09-15: qty 1

## 2022-09-15 MED ORDER — LIDOCAINE HCL (CARDIAC) PF 100 MG/5ML IV SOSY
PREFILLED_SYRINGE | INTRAVENOUS | Status: DC | PRN
  Administered 2022-09-15: 60 mg via INTRAVENOUS

## 2022-09-15 MED ORDER — SUGAMMADEX SODIUM 200 MG/2ML IV SOLN
INTRAVENOUS | Status: DC | PRN
  Administered 2022-09-15: 250 mg via INTRAVENOUS
  Administered 2022-09-15: 50 mg via INTRAVENOUS

## 2022-09-15 MED ORDER — DICLOFENAC SUPPOSITORY 100 MG: 100.0000 mg | Freq: Once | RECTAL | Status: DC

## 2022-09-15 MED ORDER — CIPROFLOXACIN IN D5W 400 MG/200ML IV SOLN
INTRAVENOUS | Status: AC
  Filled 2022-09-15: qty 200

## 2022-09-15 MED ORDER — KETAMINE HCL 10 MG/ML IJ SOLN
INTRAMUSCULAR | Status: DC | PRN
  Administered 2022-09-15: 20 mg via INTRAVENOUS

## 2022-09-15 MED ORDER — PHENYLEPHRINE HCL-NACL 20-0.9 MG/250ML-% IV SOLN
INTRAVENOUS | Status: DC | PRN
  Administered 2022-09-15: 40 ug/min via INTRAVENOUS

## 2022-09-15 MED ORDER — HEPARIN (PORCINE) 25000 UT/250ML-% IV SOLN
1600.0000 [IU]/h | INTRAVENOUS | Status: DC
  Administered 2022-09-15: 1300 [IU]/h via INTRAVENOUS
  Filled 2022-09-15: qty 250

## 2022-09-15 MED ORDER — PROPOFOL 10 MG/ML IV BOLUS
INTRAVENOUS | Status: DC | PRN
  Administered 2022-09-15: 200 mg via INTRAVENOUS

## 2022-09-15 NOTE — Progress Notes (Signed)
[facility] PROGRESS NOTE    Progress Note  Peter [name]  WUJ:[id number] DOB: 1981-02-13 DOA: 09/12/2022 PCP: Pcp, No     Brief Narrative:   Peter [name] is an 42 y.o. male past medical history significant for alcohol abuse, essential hypertension, diabetes mellitus with prior DKA's admitted for hyperglycemia and uncontrolled hypertension and abnormal liver function test.  Of note patient has lost health insurance. Right upper quadrant ultrasound distended gallbladder with stones and sludge, biliary tree was dilated, common bile duct measures 12 mm, no wall thickening. MRCP showed 2  centimeter hypervascular lesion in the head of the pancreas obstructing the portal vein confluence with splenic thrombosis and focal segmental pancreatitis.   Assessment/Plan:   Elevated LFT's: Currently n.p.o. GI was consulted he scheduled for ERCP on 09/15/2022. LFTs continue to be elevated.  Alcohol abuse: With a recent binge drink on 09/11/2022. Continue thiamine and folate. Continue Librium protocol. Monitor for signs of withdrawal. Currently no signs of withdrawal.  Uncontrolled type 2 diabetes mellitus with hyperglycemia, without long-term current use of insulin (HCC) A1c of 8.9, on admission he was hyperglycemic. Continue sliding scale insulin will add long-acting insulin.  Hypovolemic hyponatremia: KVO IV fluids.  Essential hypertension: Pressure is much improved today. Continue amlodipine and clonidine. Hydralazine IV as needed.    DVT prophylaxis: lovenox Family Communication:none Status is: Observation The patient will require care spanning > 2 midnights and should be moved to inpatient because: Acute abdominal pain likely due to choledocholithiasis    Code Status:     Code Status Orders  (From admission, onward)           Start     Ordered   09/12/22 1755  Full code  Continuous       Question:  By:  Answer:  Consent: discussion documented in EHR    09/12/22 1756           Code Status History     Date Active Date Inactive Code Status Order ID Comments User Context   10/29/2020 2326 10/31/2020 1746 Full Code [id number]  Rometta Emery, MD ED   10/29/2020 1738 10/29/2020 2326 Full Code [id number]  Placido Sou, PA-C ED   04/04/2020 1154 04/15/2020 1706 Full Code [id number]  Jae Dire, MD ED   01/21/2018 2106 01/23/2018 2101 Full Code [id number]  Teryl Lucy, MD ED   05/20/2017 2023 05/21/2017 1840 Full Code [id number]  Elder Love, MD ED   05/20/2017 1804 05/20/2017 2022 Full Code [id number]  Blane Ohara, MD ED   02/15/2017 0222 02/16/2017 2329 Full Code [id number]  Delano Metz, MD ED   02/14/2017 1413 02/15/2017 0222 Full Code [id number]  Georgiana Shore, PA-C ED   12/02/2016 2021 12/07/2016 1724 Full Code [id number]  Clydie Braun, MD ED         IV Access:   Peripheral IV   Procedures and diagnostic studies:   MR ABDOMEN MRCP W WO CONTAST  Result Date: 09/13/2022 CLINICAL DATA:  Abdominal pain. Acute pancreatitis. Elevated liver function tests. EXAM: MRI ABDOMEN WITHOUT AND WITH CONTRAST (INCLUDING MRCP) TECHNIQUE: Multiplanar multisequence MR imaging of the abdomen was performed both before and after the administration of intravenous contrast. Heavily T2-weighted images of the biliary and pancreatic ducts were obtained, and three-dimensional MRCP images were rendered by post processing. CONTRAST:  8mL GADAVIST GADOBUTROL 1 MMOL/ML IV SOLN COMPARISON:  04/12/2020 FINDINGS: Lower chest: No acute findings. Hepatobiliary: No hepatic masses identified. Gallbladder is distended and shows several tiny gallstones.  Mild diffuse gallbladder wall thickening is seen, and cholecystitis cannot be excluded. Diffuse intra and extrahepatic biliary ductal dilatation is seen, with common bile duct measuring 11 mm in diameter. Stricture of the distal common bile duct is seen, without evidence of choledocholithiasis. Pancreas: Mild  pancreatic ductal dilatation is seen. An ill-defined hypovascular lesion is seen in the pancreatic head measuring 2.7 x 2.1 cm on image 54/16. Is mild edema seen adjacent to the pancreatic head and within the porta hepatis. This raises suspicion for pancreatic carcinoma, although differential diagnosis still includes focal pancreatitis. This also encases and obstructs the portal venous confluence, and causes splenic vein thrombosis. Spleen:  Within normal limits in size and appearance. Adrenals/Urinary Tract: No suspicious masses identified. No evidence of hydronephrosis. Stomach/Bowel: Unremarkable. Vascular/Lymphatic: No pathologically enlarged lymph nodes identified. No acute vascular findings. Other:  None. Musculoskeletal:  No suspicious bone lesions identified. IMPRESSION: 2.7 cm ill-defined hypovascular lesion in the pancreatic head, which encases and obstructs the portal venous confluence, and causes splenic vein thrombosis. This is suspicious for pancreatic carcinoma, focal pancreatitis considered less likely. EUS/FNA should be considered for further evaluation. Diffuse biliary and pancreatic ductal dilatation. Cholelithiasis. Mild diffuse gallbladder wall thickening and distention also noted. Cholecystitis cannot be excluded. Consider nuclear medicine hepatobiliary scan if clinically warranted. Electronically Signed   By: Danae Orleans M.D.   On: 09/13/2022 12:17   MR 3D Recon At Scanner  Result Date: 09/13/2022 CLINICAL DATA:  Abdominal pain. Acute pancreatitis. Elevated liver function tests. EXAM: MRI ABDOMEN WITHOUT AND WITH CONTRAST (INCLUDING MRCP) TECHNIQUE: Multiplanar multisequence MR imaging of the abdomen was performed both before and after the administration of intravenous contrast. Heavily T2-weighted images of the biliary and pancreatic ducts were obtained, and three-dimensional MRCP images were rendered by post processing. CONTRAST:  8mL GADAVIST GADOBUTROL 1 MMOL/ML IV SOLN COMPARISON:   04/12/2020 FINDINGS: Lower chest: No acute findings. Hepatobiliary: No hepatic masses identified. Gallbladder is distended and shows several tiny gallstones. Mild diffuse gallbladder wall thickening is seen, and cholecystitis cannot be excluded. Diffuse intra and extrahepatic biliary ductal dilatation is seen, with common bile duct measuring 11 mm in diameter. Stricture of the distal common bile duct is seen, without evidence of choledocholithiasis. Pancreas: Mild pancreatic ductal dilatation is seen. An ill-defined hypovascular lesion is seen in the pancreatic head measuring 2.7 x 2.1 cm on image 54/16. Is mild edema seen adjacent to the pancreatic head and within the porta hepatis. This raises suspicion for pancreatic carcinoma, although differential diagnosis still includes focal pancreatitis. This also encases and obstructs the portal venous confluence, and causes splenic vein thrombosis. Spleen:  Within normal limits in size and appearance. Adrenals/Urinary Tract: No suspicious masses identified. No evidence of hydronephrosis. Stomach/Bowel: Unremarkable. Vascular/Lymphatic: No pathologically enlarged lymph nodes identified. No acute vascular findings. Other:  None. Musculoskeletal:  No suspicious bone lesions identified. IMPRESSION: 2.7 cm ill-defined hypovascular lesion in the pancreatic head, which encases and obstructs the portal venous confluence, and causes splenic vein thrombosis. This is suspicious for pancreatic carcinoma, focal pancreatitis considered less likely. EUS/FNA should be considered for further evaluation. Diffuse biliary and pancreatic ductal dilatation. Cholelithiasis. Mild diffuse gallbladder wall thickening and distention also noted. Cholecystitis cannot be excluded. Consider nuclear medicine hepatobiliary scan if clinically warranted. Electronically Signed   By: Danae Orleans M.D.   On: 09/13/2022 12:17     Medical Consultants:   None.   Subjective:    Peter [name] no  complaints  Objective:    Vitals:  09/14/22 1200 09/14/22 1446 09/14/22 2036 09/15/22 0526  BP: 126/82 107/76 125/86 119/82  Pulse: 78 79 81 66  Resp:  16 16 15   Temp:  98.1 F (36.7 C) 98.4 F (36.9 C) (!) 97.5 F (36.4 C)  TempSrc:  Oral Oral Oral  SpO2:  97% 98% 100%  Weight:      Height:       SpO2: 100 %   Intake/Output Summary (Last 24 hours) at 09/15/2022 1046 Last data filed at 09/14/2022 1907 Gross per 24 hour  Intake 480 ml  Output --  Net 480 ml   Filed Weights   09/12/22 1320 09/12/22 1841  Weight: 83.9 kg 81 kg    Exam: General exam: In no acute distress. Respiratory system: Good air movement and clear to auscultation. Cardiovascular system: S1 & S2 heard, RRR. No JVD. Gastrointestinal system: Abdomen is nondistended, soft and nontender.  Extremities: No pedal edema. Skin: No rashes, lesions or ulcers Psychiatry: Judgement and insight appear normal. Mood & affect appropriate. Data Reviewed:    Labs: Basic Metabolic Panel: Recent Labs  Lab 09/12/22 1341 09/13/22 0302 09/14/22 0518 09/15/22 0521  NA 130* 130* 130* 132*  K 3.7 3.6 4.4 4.0  CL 92* 94* 95* 96*  CO2 22 25 24 26   GLUCOSE 445* 269* 215* 269*  BUN 7 7 10 8   CREATININE 0.78 0.73 0.79 1.00  CALCIUM 9.3 8.8* 8.6* 9.0   GFR Estimated Creatinine Clearance: 100.4 mL/min (by C-G formula based on SCr of 1 mg/dL). Liver Function Tests: Recent Labs  Lab 09/12/22 1341 09/13/22 0302 09/14/22 0518 09/15/22 0521  AST 675* 491* 458* 205*  ALT 480* 440* 396* 343*  ALKPHOS 342* 299* 278* 314*  BILITOT 5.3* 7.2* 5.4* 2.2*  PROT 7.9 7.0 6.3* 6.9  ALBUMIN 3.8 3.4* 3.0* 3.1*   Recent Labs  Lab 09/13/22 0301  LIPASE 103*   No results for input(s): "AMMONIA" in the last 168 hours. Coagulation profile Recent Labs  Lab 09/12/22 1529 09/13/22 0302  INR 1.0 1.1   COVID-19 Labs  Recent Labs    09/12/22 1341  DDIMER 0.71*    Lab Results  Component Value Date   SARSCOV2NAA  NEGATIVE 09/12/2022   SARSCOV2NAA NEGATIVE 10/30/2020   SARSCOV2NAA NEGATIVE 04/04/2020    CBC: Recent Labs  Lab 09/12/22 1341 09/13/22 0302  WBC 9.1 6.7  NEUTROABS 7.6  --   HGB 16.8 15.1  HCT 45.9 42.4  MCV 94.6 98.1  PLT 165 144*   Cardiac Enzymes: No results for input(s): "CKTOTAL", "CKMB", "CKMBINDEX", "TROPONINI" in the last 168 hours. BNP (last 3 results) No results for input(s): "PROBNP" in the last 8760 hours. CBG: Recent Labs  Lab 09/14/22 0752 09/14/22 1145 09/14/22 1739 09/14/22 2118 09/15/22 0751  GLUCAP 237* 287* 159* 271* 255*   D-Dimer: Recent Labs    09/12/22 1341  DDIMER 0.71*   Hgb A1c: Recent Labs    09/12/22 1934  HGBA1C 8.9*   Lipid Profile: No results for input(s): "CHOL", "HDL", "LDLCALC", "TRIG", "CHOLHDL", "LDLDIRECT" in the last 72 hours. Thyroid function studies: No results for input(s): "TSH", "T4TOTAL", "T3FREE", "THYROIDAB" in the last 72 hours.  Invalid input(s): "FREET3" Anemia work up: No results for input(s): "VITAMINB12", "FOLATE", "FERRITIN", "TIBC", "IRON", "RETICCTPCT" in the last 72 hours. Sepsis Labs: Recent Labs  Lab 09/12/22 1341 09/12/22 1529 09/13/22 0302  WBC 9.1  --  6.7  LATICACIDVEN  --  1.5  --    Microbiology Recent Results (from the past  240 hour(s))  Resp panel by RT-PCR (RSV, Flu A&B, Covid) Anterior Nasal Swab     Status: None   Collection Time: 09/12/22  1:24 PM   Specimen: Anterior Nasal Swab  Result Value Ref Range Status   SARS Coronavirus 2 by RT PCR NEGATIVE NEGATIVE Final    Comment: (NOTE) SARS-CoV-2 target nucleic acids are NOT DETECTED.  The SARS-CoV-2 RNA is generally detectable in upper respiratory specimens during the acute phase of infection. The lowest concentration of SARS-CoV-2 viral copies this assay can detect is 138 copies/mL. A negative result does not preclude SARS-Cov-2 infection and should not be used as the sole basis for treatment or other patient management  decisions. A negative result may occur with  improper specimen collection/handling, submission of specimen other than nasopharyngeal swab, presence of viral mutation(s) within the areas targeted by this assay, and inadequate number of viral copies(<138 copies/mL). A negative result must be combined with clinical observations, patient history, and epidemiological information. The expected result is Negative.  Fact Sheet for Patients:  BloggerCourse.com  Fact Sheet for Healthcare Providers:  SeriousBroker.it  This test is no t yet approved or cleared by the [place] FDA and  has been authorized for detection and/or diagnosis of SARS-CoV-2 by FDA under an Emergency Use Authorization (EUA). This EUA will remain  in effect (meaning this test can be used) for the duration of the COVID-19 declaration under Section 564(b)(1) of the Act, 21 U.S.C.section 360bbb-3(b)(1), unless the authorization is terminated  or revoked sooner.       Influenza A by PCR NEGATIVE NEGATIVE Final   Influenza B by PCR NEGATIVE NEGATIVE Final    Comment: (NOTE) The Xpert Xpress SARS-CoV-2/FLU/RSV plus assay is intended as an aid in the diagnosis of influenza from Nasopharyngeal swab specimens and should not be used as a sole basis for treatment. Nasal washings and aspirates are unacceptable for Xpert Xpress SARS-CoV-2/FLU/RSV testing.  Fact Sheet for Patients: BloggerCourse.com  Fact Sheet for Healthcare Providers: SeriousBroker.it  This test is not yet approved or cleared by the [place] FDA and has been authorized for detection and/or diagnosis of SARS-CoV-2 by FDA under an Emergency Use Authorization (EUA). This EUA will remain in effect (meaning this test can be used) for the duration of the COVID-19 declaration under Section 564(b)(1) of the Act, 21 U.S.C. section 360bbb-3(b)(1), unless the  authorization is terminated or revoked.     Resp Syncytial Virus by PCR NEGATIVE NEGATIVE Final    Comment: (NOTE) Fact Sheet for Patients: BloggerCourse.com  Fact Sheet for Healthcare Providers: SeriousBroker.it  This test is not yet approved or cleared by the [place] FDA and has been authorized for detection and/or diagnosis of SARS-CoV-2 by FDA under an Emergency Use Authorization (EUA). This EUA will remain in effect (meaning this test can be used) for the duration of the COVID-19 declaration under Section 564(b)(1) of the Act, 21 U.S.C. section 360bbb-3(b)(1), unless the authorization is terminated or revoked.  Performed at [facility], [address] [place], [place] [address]   MRSA Next Gen by PCR, Nasal     Status: None   Collection Time: 09/12/22  6:51 PM   Specimen: Nasal Mucosa; Nasal Swab  Result Value Ref Range Status   MRSA by PCR Next Gen NOT DETECTED NOT DETECTED Final    Comment: (NOTE) The GeneXpert MRSA Assay (FDA approved for NASAL specimens only), is one component of a comprehensive MRSA colonization surveillance program. It is not intended to diagnose  MRSA infection nor to guide or monitor treatment for MRSA infections. Test performance is not FDA approved in patients less than 58 years old. Performed at [facility], [address], [place], [place] [address]      Medications:    amLODipine  10 mg Oral Daily   chlordiazePOXIDE  25 mg Oral BH-qamhs   Followed by   Melene Muller ON 09/16/2022] chlordiazePOXIDE  25 mg Oral Daily   cloNIDine  0.2 mg Oral BID   enoxaparin (LOVENOX) injection  40 mg Subcutaneous Q24H   folic acid  1 mg Oral Daily   insulin aspart  0-15 Units Subcutaneous TID WC   insulin detemir  5 Units Subcutaneous BID   multivitamin with minerals  1 tablet Oral Daily   thiamine  100 mg Oral Daily   Or   thiamine  100 mg Intravenous Daily    Continuous Infusions:    LOS: 2 days   Marinda Elk  [facility]  09/15/2022, 10:46 AM

## 2022-09-15 NOTE — Progress Notes (Signed)
   09/15/22 1348  TOC Brief Assessment  Insurance and Status Reviewed (no insurance)  Patient has primary care physician No  Home environment has been reviewed yes  Prior level of function: independent  Social Determinants of Health Reivew SDOH reviewed needs interventions (pt d/c before SDOH arranged)  Readmission risk has been reviewed Yes  Transition of care needs transition of care needs identified, TOC will continue to follow (pt d/c before SDOH arranged)

## 2022-09-15 NOTE — Op Note (Signed)
[facility] Patient Name: Peter [name] Procedure Date: 09/15/2022 MRN: [id number] Attending MD: Willis Modena , MD, [id number] Date of Birth: 09/03/1980 CSN: [id number] Age: 42 Admit Type: Inpatient Procedure:                Upper EUS Indications:              Common bile duct dilation (acquired) seen on MRCP,                            Suspected mass in pancreas on MRCP, Elevated liver                            enzymes Providers:                Willis Modena, MD, Cephus Richer, RN, Stephens Shire RN, RN, Kandice Robinsons, Technician Referring MD:             [facility] Medicines:                General Anesthesia Complications:            No immediate complications. Estimated Blood Loss:     Estimated blood loss: none. Procedure:                Pre-Anesthesia Assessment:                           - Prior to the procedure, a History and Physical                            was performed, and patient medications and                            allergies were reviewed. The patient's tolerance of                            previous anesthesia was also reviewed. The risks                            and benefits of the procedure and the sedation                            options and risks were discussed with the patient.                            All questions were answered, and informed consent                            was obtained. Prior Anticoagulants: The patient has                            taken no anticoagulant or antiplatelet agents. ASA  Grade Assessment: II - A patient with mild systemic                            disease. After reviewing the risks and benefits,                            the patient was deemed in satisfactory condition to                            undergo the procedure.                           After obtaining informed consent, the endoscope was                             passed under direct vision. Throughout the                            procedure, the patient's blood pressure, pulse, and                            oxygen saturations were monitored continuously. The                            [id number] ([id number]) Olympus linear ultrasound scope                            was introduced through the mouth, and advanced to                            the second part of duodenum. The upper EUS was                            accomplished without difficulty. The patient                            tolerated the procedure well. Scope In: Scope Out: Findings:      ENDOSCOPIC FINDING: :      Diffuse moderate mucosal changes characterized by congestion and       erythema were found in the duodenal bulb and in the first portion of the       duodenum.      ENDOSONOGRAPHIC FINDING: :      There was no sign of significant endosonographic abnormality in the       ampulla.      There was dilation in the common bile duct which measured up to 10 mm.       Diffuse symmetrical distal intrapancreatic extrahepatic bile duct wall       thickening, suspect reactive in nature.      A few lymph nodes were visualized with the ultrasound probe in the       peripancreatic region. The nodes were triangular.      An irregular mass versus focal inflammation was identified in the       pancreatic head. The lesion was hypoechoic. The lesion measured 20 mm by  20 mm in maximal cross-sectional diameter. The endosonographic borders       were poorly-defined. There was sonographic evidence suggesting invasion       into the superior mesenteric vein (manifested by abutment). An intact       interface was seen between the mass and the superior mesenteric artery       and celiac trunk suggesting a lack of invasion. The remainder of the       pancreas was examined. The endosonographic appearance of parenchyma and       the upstream pancreatic duct indicated no duct dilation. Fine needle        aspiration for cytology was performed. Color Doppler imaging was       utilized prior to needle puncture to confirm a lack of significant       vascular structures within the needle path. Three passes were made with       the 25 gauge needle using a transduodenal approach. A stylet was used. A       cytologist was present and performed a preliminary cytologic       examination. The cellularity of the specimen was adequate. Final       cytology results are pending. Impression:               - Mucosal changes in the duodenum.                           - There was no sign of significant pathology in the                            ampulla.                           - There was dilation in the common bile duct which                            measured up to 10 mm.                           - A few lymph nodes were visualized and measured in                            the peripancreatic region. Symmetrical distal bile                            duct wall thickening. Suspected                            inflammatory-mediated.                           - A possible mass versus focal inflammation was                            identified in the pancreatic head. Fine needle                            aspiration performed. I am not convinced this is  malignancy, though this can't be excluded. Moderate Sedation:      None Recommendation:           - Return patient to hospital ward for ongoing care.                           - Soft diet today.                           - Continue present medications.                           - Await cytology results.                           - If cytology unrevealing, will need surgical input                            for possible cholecystectomy. Also will need                            hematology input this admission on management of                            splenic vein thrombosis (which can occur both due                             to inflammation/pancreatitis as well as with                            malignancy).                           [facility] will follow. Procedure Code(s):        --- Professional ---                           [phone], Esophagogastroduodenoscopy, flexible,                            transoral; with transendoscopic ultrasound-guided                            intramural or transmural fine needle                            aspiration/biopsy(s), (includes endoscopic                            ultrasound examination limited to the esophagus,                            stomach or duodenum, and adjacent structures) Diagnosis Code(s):        --- Professional ---                           K31.89, Other diseases of stomach and duodenum  K83.8, Other specified diseases of biliary tract                           K86.89, Other specified diseases of pancreas                           R74.8, Abnormal levels of other serum enzymes                           R93.3, Abnormal findings on diagnostic imaging of                            other parts of digestive tract CPT copyright 2022 American [employer] Association. All rights reserved. The codes documented in this report are preliminary and upon coder review may  be revised to meet current compliance requirements. Willis Modena, MD 09/15/2022 1:55:54 PM This report has been signed electronically. Number of Addenda: 0

## 2022-09-15 NOTE — Anesthesia Postprocedure Evaluation (Signed)
Anesthesia Post Note  Patient: Peter [name]  Procedure(s) Performed: UPPER ENDOSCOPIC ULTRASOUND (EUS) LINEAR (Left) FINE NEEDLE ASPIRATION (FNA) LINEAR     Patient location during evaluation: PACU Anesthesia Type: General Level of consciousness: awake and alert Pain management: pain level controlled Vital Signs Assessment: post-procedure vital signs reviewed and stable Respiratory status: spontaneous breathing, nonlabored ventilation and respiratory function stable Cardiovascular status: blood pressure returned to baseline and stable Postop Assessment: no apparent nausea or vomiting Anesthetic complications: no  No notable events documented.  Last Vitals:  Vitals:   09/15/22 1430 09/15/22 1440  BP: 110/65 105/64  Pulse: 84 84  Resp: 12 15  Temp:    SpO2: 95% 95%    Last Pain:  Vitals:   09/15/22 1440  TempSrc:   PainSc: 0-No pain                 Derika Eckles,W. EDMOND

## 2022-09-15 NOTE — Plan of Care (Signed)

## 2022-09-15 NOTE — Inpatient Diabetes Management (Signed)
Inpatient Diabetes Program Recommendations  AACE/ADA: New Consensus Statement on Inpatient Glycemic Control (2015)  Target Ranges:  Prepandial:   less than 140 mg/dL      Peak postprandial:   less than 180 mg/dL (1-2 hours)      Critically ill patients:  140 - 180 mg/dL    Latest Reference Range & Units 09/14/22 07:52 09/14/22 11:45 09/14/22 17:39 09/14/22 21:18  Glucose-Capillary 70 - 99 mg/dL 366 (H)  5 units Novolog  287 (H)  8 units Novolog  5 units Levemir  159 (H)  3 units Novolog  271 (H)     5 units Levemir  (H): Data is abnormally high  Latest Reference Range & Units 09/15/22 07:51  Glucose-Capillary 70 - 99 mg/dL 440 (H)  (H): Data is abnormally high   Home DM Meds: Metformin 1000 mg BID (NOT taking)                             Glipizide 5 mg BID (NOT taking)   Current Orders: Novolog Moderate Correction Scale/ SSI (0-15 units) TID AC     Levemir 5 units BID    MD- Note Levemir 5 units BID started yest AM.  CBG 255 this AM.  Note pt NPO for Procedure today.  Please consider increasing the Levemir to 8 units BID  Note pt has NOT been taking DM meds at home due to loss of insurance--Have placed TOC consult.     Metformin may not be best choice for pt at home given elevated LFTs and hx of ETOH Abuse   Please renew pt's Rx for Glipizide (can get at [facility] for $4)    --Will follow patient during hospitalization--  Ambrose Finland RN, MSN, CDCES Diabetes Coordinator Inpatient Glycemic Control Team Team Pager: [phone] (8a-5p)

## 2022-09-15 NOTE — Progress Notes (Addendum)
ANTICOAGULATION CONSULT NOTE   Pharmacy Consult for heparin  Indication: splenic vein thrombosis  No Known Allergies  Patient Measurements: Height: 5\' 10"  (177.8 cm) Weight: 81 kg (178 lb 9.2 oz) IBW/kg (Calculated) : 73 Heparin Dosing Weight: 81 kg  Vital Signs: Temp: 98 F (36.7 C) (07/26 1522) Temp Source: Oral (07/26 1522) BP: 94/62 (07/26 1522) Pulse Rate: 73 (07/26 1522)  Labs: Recent Labs    09/12/22 1529 09/13/22 0302 09/14/22 0518 09/15/22 0521  HGB  --  15.1  --   --   HCT  --  42.4  --   --   PLT  --  144*  --   --   LABPROT 13.7 14.5  --   --   INR 1.0 1.1  --   --   CREATININE  --  0.73 0.79 1.00  TROPONINIHS 4  --   --   --     Estimated Creatinine Clearance: 100.4 mL/min (by C-G formula based on SCr of 1 mg/dL).   Assessment: Patient is a 42 y.o M with hx EtOH who presented to the ED on 09/12/22 with high blood pressure and for workup for COVID.   COVID test came back negative, but he was found to have elevated LFTs. 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  He underwent upper EUS on 09/15/22 with aspiration of pancreatic head. Pharmacy has been consulted s/p procedure to start heparin drip for splenic vein thrombosis.  Today, 09/15/2022: - cbc on 7/24: hgb ok, plts 144K - scr 1 (crcl~100) - Per Dr. Dulce Sellar via [facility] msg, he is ok to start heparin drip at 5pm today   Goal of Therapy:  Heparin level 0.3-0.7 units/ml Monitor platelets by anticoagulation protocol: Yes   Plan:  - heparin drip at 1300 units/hr at 5p (no bolus) - check 6 hr heparin level - monitor for s/sx bleeding  Elaysha Bevard P 09/15/2022,3:25 PM

## 2022-09-15 NOTE — Transfer of Care (Signed)
Immediate Anesthesia Transfer of Care Note  Patient: Peter [name]  Procedure(s) Performed: UPPER ENDOSCOPIC ULTRASOUND (EUS) LINEAR (Left) FINE NEEDLE ASPIRATION (FNA) LINEAR  Patient Location: PACU and Endoscopy Unit  Anesthesia Type:General  Level of Consciousness: awake, alert , oriented, and patient cooperative  Airway & Oxygen Therapy: Patient Spontanous Breathing and Patient connected to face mask oxygen  Post-op Assessment: Report given to RN and Post -op Vital signs reviewed and stable  Post vital signs: Reviewed and stable  Last Vitals:  Vitals Value Taken Time  BP    Temp    Pulse 89 09/15/22 1409  Resp 28 09/15/22 1409  SpO2 95 % 09/15/22 1409  Vitals shown include unfiled device data.  Last Pain:  Vitals:   09/15/22 1201  TempSrc: Temporal  PainSc: 0-No pain         Complications: No notable events documented.

## 2022-09-15 NOTE — Interval H&P Note (Signed)
History and Physical Interval Note:  09/15/2022 12:38 PM  Peter [name]  has presented today for surgery, with the diagnosis of obstructive jaundice.  The various methods of treatment have been discussed with the patient and family. After consideration of risks, benefits and other options for treatment, the patient has consented to  Procedure(s) with comments: UPPER ENDOSCOPIC ULTRASOUND (EUS) LINEAR (Left) ENDOSCOPIC RETROGRADE CHOLANGIOPANCREATOGRAPHY (ERCP) (N/A) - Supine as a surgical intervention.  The patient's history has been reviewed, patient examined, no change in status, stable for surgery.  I have reviewed the patient's chart and labs.  Questions were answered to the patient's satisfaction.     Freddy Jaksch

## 2022-09-15 NOTE — Anesthesia Preprocedure Evaluation (Addendum)
Anesthesia Evaluation  Patient identified by MRN, date of birth, ID band Patient awake    Reviewed: Allergy & Precautions, H&P , NPO status , Patient's Chart, lab work & pertinent test results  Airway Mallampati: II  TM Distance: >3 FB Neck ROM: Full    Dental no notable dental hx. (+) Edentulous Upper, Dental Advisory Given   Pulmonary Current Smoker and Patient abstained from smoking.   Pulmonary exam normal breath sounds clear to auscultation       Cardiovascular hypertension, Pt. on medications  Rhythm:Regular Rate:Normal     Neuro/Psych   Anxiety Depression    negative neurological ROS     GI/Hepatic negative GI ROS,,,(+)     substance abuse  alcohol use  Endo/Other  diabetes, Type 2, Oral Hypoglycemic Agents    Renal/GU negative Renal ROS  negative genitourinary   Musculoskeletal   Abdominal   Peds  Hematology negative hematology ROS (+)   Anesthesia Other Findings   Reproductive/Obstetrics negative OB ROS                             Anesthesia Physical Anesthesia Plan  ASA: 2  Anesthesia Plan: General   Post-op Pain Management: Minimal or no pain anticipated   Induction: Intravenous  PONV Risk Score and Plan: 2 and Ondansetron, Dexamethasone and Midazolam  Airway Management Planned: Oral ETT  Additional Equipment:   Intra-op Plan:   Post-operative Plan: Extubation in OR  Informed Consent: I have reviewed the patients History and Physical, chart, labs and discussed the procedure including the risks, benefits and alternatives for the proposed anesthesia with the patient or authorized representative who has indicated his/her understanding and acceptance.     Dental advisory given  Plan Discussed with: CRNA  Anesthesia Plan Comments:        Anesthesia Quick Evaluation

## 2022-09-15 NOTE — Anesthesia Procedure Notes (Signed)
Procedure Name: Intubation Date/Time: 09/15/2022 1:07 PM  Performed by: Garth Bigness, CRNAPre-anesthesia Checklist: Patient identified, Emergency Drugs available, Suction available and Patient being monitored Patient Re-evaluated:Patient Re-evaluated prior to induction Oxygen Delivery Method: Circle system utilized Preoxygenation: Pre-oxygenation with 100% oxygen Induction Type: IV induction Ventilation: Mask ventilation without difficulty Laryngoscope Size: Mac and 4 Grade View: Grade I Tube type: Oral Tube size: 7.5 mm Number of attempts: 1 Airway Equipment and Method: Stylet and Oral airway Placement Confirmation: ETT inserted through vocal cords under direct vision, positive ETCO2 and breath sounds checked- equal and bilateral Secured at: 23 cm Tube secured with: Tape Dental Injury: Teeth and Oropharynx as per pre-operative assessment

## 2022-09-15 NOTE — Progress Notes (Signed)
Unable to speak with pt today, TOC needs identified no pcp, no insurance, and substance abuse, MATCH program

## 2022-09-15 NOTE — Consult Note (Signed)
Referral MD  Reason for Referral: Splenic vein thrombosis, possible pancreatic cancer  Chief Complaint  Patient presents with   Hypertension   rule out covid  : I was having some chest pain.  HPI: Peter [name] is a nice 42 year old white [name].  He has a past history of heavy alcohol use.  He has a past history of opioid dependence.  He has had past peritonitis from drinking.  He drinks both beer and hard liquor.  He apparently has lost 55 pounds over a matter of couple of years.  He apparently came to the ER on 09/12/2022.  He apparently had problems with high blood pressure.  He has been on amlodipine.  He apparently lost health insurance.  He also was found to have marked hyperglycemia.  He had a c-Met on admission.  His bilirubin was 5.3.  His SGPT was 480 SGOT 675.  Alkaline phosphatase 342.  Blood sugar was 445.  BUN 7 creatinine 0.70.  Blood sodium 130 with a potassium 3.7.  His white cell count is 9.1.  Hemoglobin 16.8.  Platelet count 165,000.  He had a CT angiogram of the chest done on 09/12/2022.  This was negative for any pulmonary embolism.  He had abdominal ultrasound.  This showed some possible gallstones.  His liver looked okay.  There was some evidence of fatty liver.  His bile duct was slightly enlarged at the 12 mm.  There is no obvious adenopathy.  He did have an MRCP done on 09/13/2022.  This did show a mass at the head of the pancreas.  This measured 2.7 x 2.1 cm.  There was some involvement of the splenic vein causing thrombosis.  He underwent upper endoscopy today.  I think he underwent ERCP and endoscopic ultrasound.  Biopsies were taken.  The gastroenterologist was not sure that this was malignant.  He thought this may have been inflammation.  His labs today show sodium 132.  Potassium 4.0.  Blood sugar 269.  He is total bilirubin is 2.2.  SGPT 343 SGOT 205.  Alkaline phosphatase 314.  His last CBC was done on 09/13/2022.  This showed a white cell count 6.7.   Hemoglobin 15.1.  Platelet count 144,000.  He is on been on prophylactic anticoagulation.  He has never had any problems with bleeding.  He does not smoke.  He has had no obvious change in bowel or bladder habits.  He denies any type of pruritus.  There is no cough.  He has never had COVID.  Overall, I would say that his performance status is probably ECOG 1.  Past Medical History:  Diagnosis Date   Alcoholism (HCC)    DKA (diabetic ketoacidosis) (HCC) 04/04/2020   Hypertension    Substance abuse (HCC)   :   Past Surgical History:  Procedure Laterality Date   DENTAL SURGERY     TIBIA IM NAIL INSERTION Right 01/22/2018   Procedure: RIGHT INTRAMEDULLARY (IM) NAIL TIBIAL;  Surgeon: Sheral Apley, MD;  Location: [facility] OR;  Service: Orthopedics;  Laterality: Right;  :   Current Facility-Administered Medications:    amLODipine (NORVASC) tablet 10 mg, 10 mg, Oral, Daily, Marinda Elk, MD, 10 mg at 09/15/22 1011   [COMPLETED] chlordiazePOXIDE (LIBRIUM) capsule 25 mg, 25 mg, Oral, QID, 25 mg at 09/13/22 2129 **FOLLOWED BY** [COMPLETED] chlordiazePOXIDE (LIBRIUM) capsule 25 mg, 25 mg, Oral, TID, 25 mg at 09/14/22 2136 **FOLLOWED BY** chlordiazePOXIDE (LIBRIUM) capsule 25 mg, 25 mg, Oral, BH-qamhs, 25 mg at 09/15/22 1010 **  FOLLOWED BY** [START ON 09/16/2022] chlordiazePOXIDE (LIBRIUM) capsule 25 mg, 25 mg, Oral, Daily, Marinda Elk, MD   cloNIDine (CATAPRES) tablet 0.2 mg, 0.2 mg, Oral, BID, Marinda Elk, MD, 0.2 mg at 09/15/22 1009   enoxaparin (LOVENOX) injection 40 mg, 40 mg, Subcutaneous, Q24H, Marinda Elk, MD, 40 mg at 09/14/22 2137   folic acid (FOLVITE) tablet 1 mg, 1 mg, Oral, Daily, Marinda Elk, MD, 1 mg at 09/15/22 1010   heparin ADULT infusion 100 units/mL (25000 units/244mL), 1,300 Units/hr, Intravenous, Continuous, Pham, Anh P, RPH   hydrALAZINE (APRESOLINE) injection 10 mg, 10 mg, Intravenous, Q6H PRN, Marinda Elk, MD    hydrOXYzine (ATARAX) tablet 25 mg, 25 mg, Oral, Q6H PRN, Marinda Elk, MD, 25 mg at 09/14/22 2136   insulin aspart (novoLOG) injection 0-15 Units, 0-15 Units, Subcutaneous, TID WC, Anthoney Harada, NP, 8 Units at 09/15/22 1015   insulin detemir (LEVEMIR) injection 10 Units, 10 Units, Subcutaneous, BID, Marinda Elk, MD   loperamide (IMODIUM) capsule 2-4 mg, 2-4 mg, Oral, PRN, Marinda Elk, MD   LORazepam (ATIVAN) tablet 1-4 mg, 1-4 mg, Oral, Q1H PRN, Marinda Elk, MD   multivitamin with minerals tablet 1 tablet, 1 tablet, Oral, Daily, Marinda Elk, MD, 1 tablet at 09/15/22 1010   ondansetron (ZOFRAN) tablet 4 mg, 4 mg, Oral, Q6H PRN **OR** ondansetron (ZOFRAN) injection 4 mg, 4 mg, Intravenous, Q6H PRN, Marinda Elk, MD   thiamine (VITAMIN B1) tablet 100 mg, 100 mg, Oral, Daily, 100 mg at 09/15/22 1010 **OR** thiamine (VITAMIN B1) injection 100 mg, 100 mg, Intravenous, Daily, Marinda Elk, MD   traZODone (DESYREL) tablet 25 mg, 25 mg, Oral, QHS PRN, Marinda Elk, MD:   amLODipine  10 mg Oral Daily   chlordiazePOXIDE  25 mg Oral BH-qamhs   Followed by   Melene Muller ON 09/16/2022] chlordiazePOXIDE  25 mg Oral Daily   cloNIDine  0.2 mg Oral BID   enoxaparin (LOVENOX) injection  40 mg Subcutaneous Q24H   folic acid  1 mg Oral Daily   insulin aspart  0-15 Units Subcutaneous TID WC   insulin detemir  10 Units Subcutaneous BID   multivitamin with minerals  1 tablet Oral Daily   thiamine  100 mg Oral Daily   Or   thiamine  100 mg Intravenous Daily  :  No Known Allergies:   Family History  Problem Relation Age of Onset   Hypertension Father    Stroke Father    CAD Father    Cancer Paternal Grandfather        Lung   Hyperlipidemia Paternal Grandfather    Hypertension Paternal Grandfather   :   Social History   Socioeconomic History   Marital status: Single    Spouse name: Not on file   Number of children: Not on file    Years of education: Not on file   Highest education level: Not on file  Occupational History   Not on file  Tobacco Use   Smoking status: Every Day    Current packs/day: 0.50    Types: Cigarettes   Smokeless tobacco: Never  Vaping Use   Vaping status: Never Used  Substance and Sexual Activity   Alcohol use: Not Currently    Comment:     Drug use: No   Sexual activity: Yes    Birth control/protection: Condom  Other Topics Concern   Not on file  Social History Narrative   Not on  file   Social Determinants of Health   Financial Resource Strain: Not on file  Food Insecurity: Not on file  Transportation Needs: Not on file  Physical Activity: Not on file  Stress: Not on file  Social Connections: Not on file  Intimate Partner Violence: Not on file  :  Review of Systems  Constitutional:  Positive for malaise/fatigue and weight loss.  HENT: Negative.    Eyes: Negative.   Respiratory: Negative.    Cardiovascular:  Positive for chest pain.  Gastrointestinal:  Positive for abdominal pain.  Genitourinary: Negative.   Musculoskeletal:  Positive for myalgias.  Skin: Negative.   Neurological: Negative.   Endo/Heme/Allergies: Negative.   Psychiatric/Behavioral: Negative.       Exam:     Physical Exam Vitals reviewed.  HENT:     Head: Normocephalic and atraumatic.  Eyes:     Pupils: Pupils are equal, round, and reactive to light.  Cardiovascular:     Rate and Rhythm: Normal rate and regular rhythm.     Heart sounds: Normal heart sounds.  Pulmonary:     Effort: Pulmonary effort is normal.     Breath sounds: Normal breath sounds.  Abdominal:     General: Bowel sounds are normal.     Palpations: Abdomen is soft.  Musculoskeletal:        General: No tenderness or deformity. Normal range of motion.     Cervical back: Normal range of motion.  Lymphadenopathy:     Cervical: No cervical adenopathy.  Skin:    General: Skin is warm and dry.     Findings: No erythema or  rash.  Neurological:     Mental Status: He is alert and oriented to person, place, and time.  Psychiatric:        Behavior: Behavior normal.        Thought Content: Thought content normal.        Judgment: Judgment normal.     Patient Vitals for the past 24 hrs:  BP Temp Temp src Pulse Resp SpO2 Height Weight  09/15/22 1522 94/62 98 F (36.7 C) Oral 73 16 96 % -- --  09/15/22 1440 105/64 -- -- 84 15 95 % -- --  09/15/22 1430 110/65 -- -- 84 12 95 % -- --  09/15/22 1420 107/62 -- -- 87 13 93 % -- --  09/15/22 1410 112/73 97.9 F (36.6 C) Temporal 89 (!) 28 95 % -- --  09/15/22 1201 118/83 (!) 96.6 F (35.9 C) Temporal 70 11 99 % 5\' 10"  (1.778 m) 178 lb 9.2 oz (81 kg)  09/15/22 0526 119/82 (!) 97.5 F (36.4 C) Oral 66 15 100 % -- --  09/14/22 2036 125/86 98.4 F (36.9 C) Oral 81 16 98 % -- --      Recent Labs    09/13/22 0302  WBC 6.7  HGB 15.1  HCT 42.4  PLT 144*    Recent Labs    09/14/22 0518 09/15/22 0521  NA 130* 132*  K 4.4 4.0  CL 95* 96*  CO2 24 26  GLUCOSE 215* 269*  BUN 10 8  CREATININE 0.79 1.00  CALCIUM 8.6* 9.0    Blood smear review: None  Pathology: Pending    Assessment and Plan: Peter [name] is a nice 42 year old [name].  He has a history of heavy alcohol use.  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  This mass was biopsied.  At this time, I probably want heparin.  I think an issue is trying to get him on outpatient anticoagulation.,  I am not sure how he is can be able to afford medication.  Again, everything will hinge on the biopsy.  This is pancreatic cancer, we will have to see if he is a candidate for upfront surgical resection.  I would not think that where the tumor is located or what it involves would prevent him from being operated on.  From what I can tell by the endoscopy report, the superior mesenteric artery seems to be clear of the  tumor.  I am going to send off a CA 19-9.  We will see what this shows.  It is hard to say how long he will need anticoagulation.  Again, everything will come down to the biopsy result.  Again I will keep him off anticoagulation with heparin over the weekend.  The labs can be monitored.  No pharmacy will do a great job managing this for [place].  He clearly is going had to stop drinking.  He says that he well.  I am somewhat dubious of this.  He has diabetes.  This also is going to be an issue.  He does not have a family doctor from what I can tell.  There are a lot of other issues that are going on with him that really need to be taken care of.  We will follow along.  Again, there really is not much for [place] to do at this point till we get biopsy results back.  Maybe, we will have these back on Monday.  Christin Bach, MD  Psalm 18:6

## 2022-09-16 DIAGNOSIS — K852 Alcohol induced acute pancreatitis without necrosis or infection: Secondary | ICD-10-CM

## 2022-09-16 LAB — GLUCOSE, CAPILLARY
Glucose-Capillary: 299 mg/dL — ABNORMAL HIGH (ref 70–99)
Glucose-Capillary: 318 mg/dL — ABNORMAL HIGH (ref 70–99)
Glucose-Capillary: 237 mg/dL — ABNORMAL HIGH (ref 70–99)
Glucose-Capillary: 243 mg/dL — ABNORMAL HIGH (ref 70–99)

## 2022-09-16 LAB — HEPARIN LEVEL (UNFRACTIONATED)
Heparin Unfractionated: 0.2 IU/mL — ABNORMAL LOW (ref 0.30–0.70)
Heparin Unfractionated: 0.24 IU/mL — ABNORMAL LOW (ref 0.30–0.70)

## 2022-09-16 LAB — APTT: aPTT: 44 seconds — ABNORMAL HIGH (ref 24–36)

## 2022-09-16 MED ORDER — INSULIN DETEMIR 100 UNIT/ML ~~LOC~~ SOLN
20.0000 [IU] | Freq: Two times a day (BID) | SUBCUTANEOUS | Status: DC
  Administered 2022-09-16 – 2022-09-17 (×3): 20 [IU] via SUBCUTANEOUS
  Filled 2022-09-16 (×4): qty 0.2

## 2022-09-16 MED ORDER — HEPARIN BOLUS VIA INFUSION
1200.0000 [IU] | Freq: Once | INTRAVENOUS | Status: AC
  Administered 2022-09-16: 1200 [IU] via INTRAVENOUS
  Filled 2022-09-16: qty 1200

## 2022-09-16 MED ORDER — INSULIN ASPART 100 UNIT/ML IJ SOLN
6.0000 [IU] | Freq: Three times a day (TID) | INTRAMUSCULAR | Status: DC
  Administered 2022-09-16 – 2022-09-17 (×5): 6 [IU] via SUBCUTANEOUS

## 2022-09-16 MED ORDER — HEPARIN (PORCINE) 25000 UT/250ML-% IV SOLN
1750.0000 [IU]/h | INTRAVENOUS | Status: DC
  Administered 2022-09-16: 1750 [IU]/h via INTRAVENOUS
  Filled 2022-09-16: qty 250

## 2022-09-16 MED ORDER — HEPARIN (PORCINE) 25000 UT/250ML-% IV SOLN
2150.0000 [IU]/h | INTRAVENOUS | Status: DC
  Administered 2022-09-16 – 2022-09-18 (×4): 1900 [IU]/h via INTRAVENOUS
  Administered 2022-09-18 – 2022-09-19 (×2): 2050 [IU]/h via INTRAVENOUS
  Filled 2022-09-16 (×5): qty 250

## 2022-09-16 NOTE — Progress Notes (Signed)
Pharmacy Brief Note - Evening Anticoagulation Follow Up:  Pt is on heparin infusion for splenic vein thrombosis. For full history, see note by Lynann Beaver, PharmD from earlier today.   Assessment: Heparin level = 0.24 remains subtherapeutic despite increasing rate of heparin to 1750 units/hr Confirmed with RN - no interruptions/line issues. No signs of bleeding.   Goal: Heparin level 0.3 - 0.7  Plan: Heparin bolus of 1200 units IV once Increase heparin infusion to 1900 units/hr Check heparin level 6 hours after rate change CBC, heparin level daily  Monitor for signs of bleeding  Cindi Carbon, PharmD 09/16/22 4:54 PM

## 2022-09-16 NOTE — Progress Notes (Signed)
[facility] PROGRESS NOTE    Progress Note  Peter [name]  ZOX:[id number] DOB: 1980-03-26 DOA: 09/12/2022 PCP: Pcp, No     Brief Narrative:   Peter [name] is an 42 y.o. male past medical history significant for alcohol abuse, essential hypertension, diabetes mellitus with prior DKA's admitted for hyperglycemia and uncontrolled hypertension and abnormal liver function test.  Of note patient has lost health insurance. Right upper quadrant ultrasound distended gallbladder with stones and sludge, biliary tree was dilated, common bile duct measures 12 mm, no wall thickening. MRCP showed 2  centimeter hypervascular lesion in the head of the pancreas obstructing the portal vein confluence with splenic thrombosis and focal segmental pancreatitis.   Assessment/Plan:   Elevated LFT's: Continue carb modified diet. GI was consulted he scheduled for ERCP done on 09/15/2022 that showed dilated common bile duct few lymph nodes biopsies were taken.   LFTs continue to be elevated. GI recommended to consult surgery for possible lap chole CA 19 has been ordered.  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 They recommend to await biopsy to see duration of anticoagulation.  Alcohol abuse: With a recent binge drink on 09/11/2022. Continue thiamine and folate. Finishing Librium protocol.  Uncontrolled type 2 diabetes mellitus with hyperglycemia, without long-term current use of insulin (HCC) A1c of 8.9, on admission he was hyperglycemic. Continue sliding scale insulin will add long-acting insulin.  Hypovolemic hyponatremia: KVO IV fluids.  Essential hypertension: Pressure is much improved today. Continue amlodipine and clonidine. Hydralazine IV as needed.    DVT prophylaxis: lovenox Family Communication:none Status is: Observation The patient will require care spanning > 2 midnights and should be moved to inpatient because: Acute  abdominal pain likely due to choledocholithiasis    Code Status:     Code Status Orders  (From admission, onward)           Start     Ordered   09/12/22 1755  Full code  Continuous       Question:  By:  Answer:  Consent: discussion documented in EHR   09/12/22 1756           Code Status History     Date Active Date Inactive Code Status Order ID Comments User Context   10/29/2020 2326 10/31/2020 1746 Full Code [id number]  Rometta Emery, MD ED   10/29/2020 1738 10/29/2020 2326 Full Code [id number]  Placido Sou, PA-C ED   04/04/2020 1154 04/15/2020 1706 Full Code [id number]  Jae Dire, MD ED   01/21/2018 2106 01/23/2018 2101 Full Code [id number]  Teryl Lucy, MD ED   05/20/2017 2023 05/21/2017 1840 Full Code [id number]  Elder Love, MD ED   05/20/2017 1804 05/20/2017 2022 Full Code [id number]  Blane Ohara, MD ED   02/15/2017 0222 02/16/2017 2329 Full Code [id number]  Delano Metz, MD ED   02/14/2017 1413 02/15/2017 0222 Full Code [id number]  Georgiana Shore, PA-C ED   12/02/2016 2021 12/07/2016 1724 Full Code [id number]  Clydie Braun, MD ED         IV Access:   Peripheral IV   Procedures and diagnostic studies:   No results found.   Medical Consultants:   None.   Subjective:    Peter [name] no is tolerating his diet.  Objective:    Vitals:   09/15/22 1440 09/15/22 1522 09/15/22 2105 09/16/22 0514  BP: 105/64 94/62 (!) 112/90 114/75  Pulse: 84 73 81 69  Resp: 15  16 16 18   Temp:  98 F (36.7 C) (!) 97.5 F (36.4 C) (!) 97.3 F (36.3 C)  TempSrc:  Oral Oral Oral  SpO2: 95% 96% 97% 98%  Weight:      Height:       SpO2: 98 %   Intake/Output Summary (Last 24 hours) at 09/16/2022 0940 Last data filed at 09/16/2022 0322 Gross per 24 hour  Intake 840.69 ml  Output 0 ml  Net 840.69 ml   Filed Weights   09/12/22 1320 09/12/22 1841 09/15/22 1201  Weight: 83.9 kg 81 kg 81 kg    Exam: General exam: In no acute  distress. Respiratory system: Good air movement and clear to auscultation. Cardiovascular system: S1 & S2 heard, RRR. No JVD. Gastrointestinal system: Abdomen is nondistended, soft and nontender.  Extremities: No pedal edema. Skin: No rashes, lesions or ulcers Psychiatry: Judgement and insight appear normal. Mood & affect appropriate. Data Reviewed:    Labs: Basic Metabolic Panel: Recent Labs  Lab 09/12/22 1341 09/13/22 0302 09/14/22 0518 09/15/22 0521  NA 130* 130* 130* 132*  K 3.7 3.6 4.4 4.0  CL 92* 94* 95* 96*  CO2 22 25 24 26   GLUCOSE 445* 269* 215* 269*  BUN 7 7 10 8   CREATININE 0.78 0.73 0.79 1.00  CALCIUM 9.3 8.8* 8.6* 9.0   GFR Estimated Creatinine Clearance: 100.4 mL/min (by C-G formula based on SCr of 1 mg/dL). Liver Function Tests: Recent Labs  Lab 09/12/22 1341 09/13/22 0302 09/14/22 0518 09/15/22 0521  AST 675* 491* 458* 205*  ALT 480* 440* 396* 343*  ALKPHOS 342* 299* 278* 314*  BILITOT 5.3* 7.2* 5.4* 2.2*  PROT 7.9 7.0 6.3* 6.9  ALBUMIN 3.8 3.4* 3.0* 3.1*   Recent Labs  Lab 09/13/22 0301  LIPASE 103*   No results for input(s): "AMMONIA" in the last 168 hours. Coagulation profile Recent Labs  Lab 09/12/22 1529 09/13/22 0302  INR 1.0 1.1   COVID-19 Labs  No results for input(s): "DDIMER", "FERRITIN", "LDH", "CRP" in the last 72 hours.   Lab Results  Component Value Date   SARSCOV2NAA NEGATIVE 09/12/2022   SARSCOV2NAA NEGATIVE 10/30/2020   SARSCOV2NAA NEGATIVE 04/04/2020    CBC: Recent Labs  Lab 09/12/22 1341 09/13/22 0302 09/16/22 0819  WBC 9.1 6.7 9.0  NEUTROABS 7.6  --   --   HGB 16.8 15.1 14.5  HCT 45.9 42.4 43.4  MCV 94.6 98.1 102.6*  PLT 165 144* 153   Cardiac Enzymes: No results for input(s): "CKTOTAL", "CKMB", "CKMBINDEX", "TROPONINI" in the last 168 hours. BNP (last 3 results) No results for input(s): "PROBNP" in the last 8760 hours. CBG: Recent Labs  Lab 09/15/22 0751 09/15/22 1115 09/15/22 1619  09/15/22 2104 09/16/22 0731  GLUCAP 255* 224* 172* 349* 299*   D-Dimer: No results for input(s): "DDIMER" in the last 72 hours.  Hgb A1c: No results for input(s): "HGBA1C" in the last 72 hours.  Lipid Profile: No results for input(s): "CHOL", "HDL", "LDLCALC", "TRIG", "CHOLHDL", "LDLDIRECT" in the last 72 hours. Thyroid function studies: No results for input(s): "TSH", "T4TOTAL", "T3FREE", "THYROIDAB" in the last 72 hours.  Invalid input(s): "FREET3" Anemia work up: No results for input(s): "VITAMINB12", "FOLATE", "FERRITIN", "TIBC", "IRON", "RETICCTPCT" in the last 72 hours. Sepsis Labs: Recent Labs  Lab 09/12/22 1341 09/12/22 1529 09/13/22 0302 09/16/22 0819  WBC 9.1  --  6.7 9.0  LATICACIDVEN  --  1.5  --   --    Microbiology Recent Results (from the past  240 hour(s))  Resp panel by RT-PCR (RSV, Flu A&B, Covid) Anterior Nasal Swab     Status: None   Collection Time: 09/12/22  1:24 PM   Specimen: Anterior Nasal Swab  Result Value Ref Range Status   SARS Coronavirus 2 by RT PCR NEGATIVE NEGATIVE Final    Comment: (NOTE) SARS-CoV-2 target nucleic acids are NOT DETECTED.  The SARS-CoV-2 RNA is generally detectable in upper respiratory specimens during the acute phase of infection. The lowest concentration of SARS-CoV-2 viral copies this assay can detect is 138 copies/mL. A negative result does not preclude SARS-Cov-2 infection and should not be used as the sole basis for treatment or other patient management decisions. A negative result may occur with  improper specimen collection/handling, submission of specimen other than nasopharyngeal swab, presence of viral mutation(s) within the areas targeted by this assay, and inadequate number of viral copies(<138 copies/mL). A negative result must be combined with clinical observations, patient history, and epidemiological information. The expected result is Negative.  Fact Sheet for Patients:   BloggerCourse.com  Fact Sheet for Healthcare Providers:  SeriousBroker.it  This test is no t yet approved or cleared by the [place] FDA and  has been authorized for detection and/or diagnosis of SARS-CoV-2 by FDA under an Emergency Use Authorization (EUA). This EUA will remain  in effect (meaning this test can be used) for the duration of the COVID-19 declaration under Section 564(b)(1) of the Act, 21 U.S.C.section 360bbb-3(b)(1), unless the authorization is terminated  or revoked sooner.       Influenza A by PCR NEGATIVE NEGATIVE Final   Influenza B by PCR NEGATIVE NEGATIVE Final    Comment: (NOTE) The Xpert Xpress SARS-CoV-2/FLU/RSV plus assay is intended as an aid in the diagnosis of influenza from Nasopharyngeal swab specimens and should not be used as a sole basis for treatment. Nasal washings and aspirates are unacceptable for Xpert Xpress SARS-CoV-2/FLU/RSV testing.  Fact Sheet for Patients: BloggerCourse.com  Fact Sheet for Healthcare Providers: SeriousBroker.it  This test is not yet approved or cleared by the [place] FDA and has been authorized for detection and/or diagnosis of SARS-CoV-2 by FDA under an Emergency Use Authorization (EUA). This EUA will remain in effect (meaning this test can be used) for the duration of the COVID-19 declaration under Section 564(b)(1) of the Act, 21 U.S.C. section 360bbb-3(b)(1), unless the authorization is terminated or revoked.     Resp Syncytial Virus by PCR NEGATIVE NEGATIVE Final    Comment: (NOTE) Fact Sheet for Patients: BloggerCourse.com  Fact Sheet for Healthcare Providers: SeriousBroker.it  This test is not yet approved or cleared by the [place] FDA and has been authorized for detection and/or diagnosis of SARS-CoV-2 by FDA under an Emergency Use  Authorization (EUA). This EUA will remain in effect (meaning this test can be used) for the duration of the COVID-19 declaration under Section 564(b)(1) of the Act, 21 U.S.C. section 360bbb-3(b)(1), unless the authorization is terminated or revoked.  Performed at [facility], [address] [place], [place] [address]   MRSA Next Gen by PCR, Nasal     Status: None   Collection Time: 09/12/22  6:51 PM   Specimen: Nasal Mucosa; Nasal Swab  Result Value Ref Range Status   MRSA by PCR Next Gen NOT DETECTED NOT DETECTED Final    Comment: (NOTE) The GeneXpert MRSA Assay (FDA approved for NASAL specimens only), is one component of a comprehensive MRSA colonization surveillance program. It is not intended to diagnose  MRSA infection nor to guide or monitor treatment for MRSA infections. Test performance is not FDA approved in patients less than 31 years old. Performed at [facility], [address] [place], [place] [address]      Medications:    amLODipine  10 mg Oral Daily   cloNIDine  0.2 mg Oral BID   folic acid  1 mg Oral Daily   heparin  1,200 Units Intravenous Once   insulin aspart  0-15 Units Subcutaneous TID WC   insulin aspart  0-5 Units Subcutaneous QHS   insulin aspart  4 Units Subcutaneous TID WC   insulin detemir  10 Units Subcutaneous BID   multivitamin with minerals  1 tablet Oral Daily   thiamine  100 mg Oral Daily   Or   thiamine  100 mg Intravenous Daily   Continuous Infusions:  heparin        LOS: 3 days   Marinda Elk  [facility]  09/16/2022, 9:40 AM

## 2022-09-16 NOTE — Progress Notes (Signed)
ANTICOAGULATION CONSULT NOTE   Pharmacy Consult for heparin  Indication: splenic vein thrombosis  No Known Allergies  Patient Measurements: Height: 5\' 10"  (177.8 cm) Weight: 81 kg (178 lb 9.2 oz) IBW/kg (Calculated) : 73 Heparin Dosing Weight: TBW  Vital Signs: Temp: 97.3 F (36.3 C) (07/27 0514) Temp Source: Oral (07/27 0514) BP: 114/75 (07/27 0514) Pulse Rate: 69 (07/27 0514)  Labs: Recent Labs    09/14/22 0518 09/15/22 0521 09/15/22 2317  HEPARINUNFRC  --   --  0.12*  CREATININE 0.79 1.00  --     Estimated Creatinine Clearance: 100.4 mL/min (by C-G formula based on SCr of 1 mg/dL).  Medications:   heparin 1,600 Units/hr (09/16/22 0322)     Assessment: Patient is a 42 y.o M with hx EtOH who presented to the ED on 09/12/22 with high blood pressure and for workup for COVID.   COVID test came back negative, but he was found to have elevated LFTs. 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  S/p EUS on 09/15/22 with aspiration of pancreatic head. Pharmacy has been consulted s/p procedure to start heparin drip for splenic vein thrombosis.  Today, 09/16/2022:  Heparin level 0.2, sub-therapeutic on heparin 1600 units/hr CBC: ip No bleeding or complications reported by RN   Goal of Therapy:  Heparin level 0.3-0.7 units/ml Monitor platelets by anticoagulation protocol: Yes   Plan:  Heparin bolus 1200 units IV x1  Increase to heparin IV infusion at 1750 units/hr Heparin level in 6 hours Daily heparin level and CBC Follow up long term plans for anticoagulation.     Lynann Beaver PharmD, BCPS [facility] main pharmacy [phone] 09/16/2022 7:31 AM

## 2022-09-16 NOTE — Progress Notes (Signed)
ANTICOAGULATION CONSULT NOTE   Pharmacy Consult for heparin  Indication: splenic vein thrombosis  No Known Allergies  Patient Measurements: Height: 5\' 10"  (177.8 cm) Weight: 81 kg (178 lb 9.2 oz) IBW/kg (Calculated) : 73 Heparin Dosing Weight: 81 kg  Vital Signs: Temp: 97.5 F (36.4 C) (07/26 2105) Temp Source: Oral (07/26 2105) BP: 112/90 (07/26 2105) Pulse Rate: 81 (07/26 2105)  Labs: Recent Labs    09/13/22 0302 09/14/22 0518 09/15/22 0521 09/15/22 2317  HGB 15.1  --   --   --   HCT 42.4  --   --   --   PLT 144*  --   --   --   LABPROT 14.5  --   --   --   INR 1.1  --   --   --   HEPARINUNFRC  --   --   --  0.12*  CREATININE 0.73 0.79 1.00  --     Estimated Creatinine Clearance: 100.4 mL/min (by C-G formula based on SCr of 1 mg/dL).   Assessment: Patient is a 42 y.o M with hx EtOH who presented to the ED on 09/12/22 with high blood pressure and for workup for COVID.   COVID test came back negative, but he was found to have elevated LFTs. 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  He underwent upper EUS on 09/15/22 with aspiration of pancreatic head. Pharmacy has been consulted s/p procedure to start heparin drip for splenic vein thrombosis.   - Per Dr. Dulce Sellar via [facility] msg, he is ok to start heparin drip at 5pm today  1st HL 0.12 subtherapeutic on 1300 units/hr No bleeding noted  Goal of Therapy:  Heparin level 0.3-0.7 units/ml Monitor platelets by anticoagulation protocol: Yes   Plan:  - increase heparin drip to 1600 units/hr ( no bolus with recent procedure) - check 6 hr heparin level - monitor for s/sx bleeding  Arley Phenix RPh 09/16/2022, 12:18 AM

## 2022-09-16 NOTE — Consult Note (Signed)
Reason for Consult: Abdominal pain Referring Physician: Dr. Belenda Cruise Peter [name] is an 42 y.o. male.  HPI: The patient is a 42 year old white male who has been experiencing upper abdominal pain for the last couple years.  The pain seems to come and go.  Over the last couple weeks the pain has been more significant.  He denies any nausea or vomiting.  He does consume a significant amount of alcohol and smokes every day.  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  On ultrasound the mass appeared to be invading the splenic vein.  Biopsies were taken which are pending.  He has lost about 50 pounds in the last year or 2.  Past Medical History:  Diagnosis Date   Alcoholism (HCC)    DKA (diabetic ketoacidosis) (HCC) 04/04/2020   Hypertension    Substance abuse (HCC)     Past Surgical History:  Procedure Laterality Date   DENTAL SURGERY     TIBIA IM NAIL INSERTION Right 01/22/2018   Procedure: RIGHT INTRAMEDULLARY (IM) NAIL TIBIAL;  Surgeon: Sheral Apley, MD;  Location: [facility] OR;  Service: Orthopedics;  Laterality: Right;    Family History  Problem Relation Age of Onset   Hypertension Father    Stroke Father    CAD Father    Cancer Paternal Grandfather        Lung   Hyperlipidemia Paternal Grandfather    Hypertension Paternal Grandfather     Social History:  reports that he has been smoking cigarettes. He has never used smokeless tobacco. He reports that he does not currently use alcohol. He reports that he does not use drugs.  Allergies: No Known Allergies  Medications: I have reviewed the patient's current medications.  Results for orders placed or performed during the hospital encounter of 09/12/22 (from the past 48 hour(s))  Glucose, capillary     Status: Abnormal   Collection Time: 09/14/22 11:45 AM  Result Value Ref Range    Glucose-Capillary 287 (H) 70 - 99 mg/dL    Comment: Glucose reference range applies only to samples taken after fasting for at least 8 hours.  Glucose, capillary     Status: Abnormal   Collection Time: 09/14/22  5:39 PM  Result Value Ref Range   Glucose-Capillary 159 (H) 70 - 99 mg/dL    Comment: Glucose reference range applies only to samples taken after fasting for at least 8 hours.  Glucose, capillary     Status: Abnormal   Collection Time: 09/14/22  9:18 PM  Result Value Ref Range   Glucose-Capillary 271 (H) 70 - 99 mg/dL    Comment: Glucose reference range applies only to samples taken after fasting for at least 8 hours.  Comprehensive metabolic panel     Status: Abnormal   Collection Time: 09/15/22  5:21 AM  Result Value Ref Range   Sodium 132 (L) 135 - 145 mmol/L   Potassium 4.0 3.5 - 5.1 mmol/L   Chloride 96 (L) 98 - 111 mmol/L   CO2 26 22 - 32 mmol/L   Glucose, Bld 269 (H) 70 - 99 mg/dL    Comment: Glucose reference range applies only to samples taken after fasting for at least 8 hours.   BUN 8 6 - 20 mg/dL   Creatinine, Ser 3.29 0.61 - 1.24 mg/dL   Calcium 9.0 8.9 -  10.3 mg/dL   Total Protein 6.9 6.5 - 8.1 g/dL   Albumin 3.1 (L) 3.5 - 5.0 g/dL   AST 295 (H) 15 - 41 U/L   ALT 343 (H) 0 - 44 U/L   Alkaline Phosphatase 314 (H) 38 - 126 U/L   Total Bilirubin 2.2 (H) 0.3 - 1.2 mg/dL   GFR, Estimated >28 >41 mL/min    Comment: (NOTE) Calculated using the CKD-EPI Creatinine Equation (2021)    Anion gap 10 5 - 15    Comment: Performed at [facility], [address], [place], [place] [address]  Glucose, capillary     Status: Abnormal   Collection Time: 09/15/22  7:51 AM  Result Value Ref Range   Glucose-Capillary 255 (H) 70 - 99 mg/dL    Comment: Glucose reference range applies only to samples taken after fasting for at least 8 hours.  Glucose, capillary     Status: Abnormal   Collection Time: 09/15/22 11:15 AM  Result Value Ref Range    Glucose-Capillary 224 (H) 70 - 99 mg/dL    Comment: Glucose reference range applies only to samples taken after fasting for at least 8 hours.  Glucose, capillary     Status: Abnormal   Collection Time: 09/15/22  4:19 PM  Result Value Ref Range   Glucose-Capillary 172 (H) 70 - 99 mg/dL    Comment: Glucose reference range applies only to samples taken after fasting for at least 8 hours.  Glucose, capillary     Status: Abnormal   Collection Time: 09/15/22  9:04 PM  Result Value Ref Range   Glucose-Capillary 349 (H) 70 - 99 mg/dL    Comment: Glucose reference range applies only to samples taken after fasting for at least 8 hours.  Heparin level (unfractionated)     Status: Abnormal   Collection Time: 09/15/22 11:17 PM  Result Value Ref Range   Heparin Unfractionated 0.12 (L) 0.30 - 0.70 IU/mL    Comment: (NOTE) The clinical reportable range upper limit is being lowered to >1.10 to align with the FDA approved guidance for the current laboratory assay.  If heparin results are below expected values, and patient dosage has  been confirmed, suggest follow up testing of antithrombin [name] levels. Performed at [facility], [address], [place], [place] [address]   Glucose, capillary     Status: Abnormal   Collection Time: 09/16/22  7:31 AM  Result Value Ref Range   Glucose-Capillary 299 (H) 70 - 99 mg/dL    Comment: Glucose reference range applies only to samples taken after fasting for at least 8 hours.  Comprehensive metabolic panel     Status: Abnormal   Collection Time: 09/16/22  8:19 AM  Result Value Ref Range   Sodium 133 (L) 135 - 145 mmol/L   Potassium 4.0 3.5 - 5.1 mmol/L   Chloride 98 98 - 111 mmol/L   CO2 23 22 - 32 mmol/L   Glucose, Bld 311 (H) 70 - 99 mg/dL    Comment: Glucose reference range applies only to samples taken after fasting for at least 8 hours.   BUN 11 6 - 20 mg/dL   Creatinine, Ser 5.36 0.61 - 1.24 mg/dL   Calcium 9.0 8.9 - 64.4 mg/dL    Total Protein 6.9 6.5 - 8.1 g/dL   Albumin 3.2 (L) 3.5 - 5.0 g/dL   AST 48 (H) 15 - 41 U/L   ALT 220 (H) 0 - 44 U/L   Alkaline  Phosphatase 273 (H) 38 - 126 U/L   Total Bilirubin 1.8 (H) 0.3 - 1.2 mg/dL   GFR, Estimated >57 >84 mL/min    Comment: (NOTE) Calculated using the CKD-EPI Creatinine Equation (2021)    Anion gap 12 5 - 15    Comment: Performed at [facility], [address], [place], [place] [address]  CBC     Status: Abnormal   Collection Time: 09/16/22  8:19 AM  Result Value Ref Range   WBC 9.0 4.0 - 10.5 K/uL   RBC 4.23 4.22 - 5.81 MIL/uL   Hemoglobin 14.5 13.0 - 17.0 g/dL   HCT 52.8 41.3 - 24.4 %   MCV 102.6 (H) 80.0 - 100.0 fL   MCH 34.3 (H) 26.0 - 34.0 pg   MCHC 33.4 30.0 - 36.0 g/dL   RDW 01.0 27.2 - 53.6 %   Platelets 153 150 - 400 K/uL   nRBC 0.0 0.0 - 0.2 %    Comment: Performed at [facility], [address] [place], [place] [address]  Heparin level (unfractionated)     Status: Abnormal   Collection Time: 09/16/22  8:19 AM  Result Value Ref Range   Heparin Unfractionated 0.20 (L) 0.30 - 0.70 IU/mL    Comment: (NOTE) The clinical reportable range upper limit is being lowered to >1.10 to align with the FDA approved guidance for the current laboratory assay.  If heparin results are below expected values, and patient dosage has  been confirmed, suggest follow up testing of antithrombin [name] levels. Performed at [facility], [address], [place], [place] [address]   APTT     Status: Abnormal   Collection Time: 09/16/22  8:19 AM  Result Value Ref Range   aPTT 44 (H) 24 - 36 seconds    Comment:        IF BASELINE aPTT IS ELEVATED, SUGGEST PATIENT RISK ASSESSMENT BE USED TO DETERMINE APPROPRIATE ANTICOAGULANT THERAPY. Performed at [facility], [address], [place], [place] [address]     No results found.  Review of Systems  Constitutional:  Positive for unexpected  weight change.  HENT: Negative.    Eyes: Negative.   Respiratory: Negative.    Cardiovascular: Negative.   Gastrointestinal:  Positive for abdominal pain.  Endocrine: Negative.   Genitourinary: Negative.   Musculoskeletal: Negative.   Skin:  Positive for color change.  Allergic/Immunologic: Negative.   Neurological: Negative.   Hematological: Negative.   Psychiatric/Behavioral: Negative.     Blood pressure 114/75, pulse 69, temperature (!) 97.3 F (36.3 C), temperature source Oral, resp. rate 18, height 5\' 10"  (1.778 m), weight 81 kg, SpO2 98%. Physical Exam Vitals reviewed.  Constitutional:      General: He is not in acute distress.    Appearance: Normal appearance.  HENT:     Head: Normocephalic and atraumatic.     Right Ear: External ear normal.     Left Ear: External ear normal.     Nose: Nose normal.     Mouth/Throat:     Mouth: Mucous membranes are moist.     Pharynx: Oropharynx is clear.  Eyes:     General: Scleral icterus present.     Extraocular Movements: Extraocular movements intact.     Conjunctiva/sclera: Conjunctivae normal.     Pupils: Pupils are equal, round, and reactive to light.  Cardiovascular:     Rate and Rhythm: Normal rate and regular rhythm.     Pulses: Normal  pulses.     Heart sounds: Normal heart sounds.  Pulmonary:     Effort: Pulmonary effort is normal. No respiratory distress.     Breath sounds: Normal breath sounds.  Abdominal:     General: Abdomen is flat.     Palpations: Abdomen is soft.     Comments: There is mild upper abdominal tenderness but no guarding  Musculoskeletal:        General: No swelling or deformity. Normal range of motion.     Cervical back: Normal range of motion and neck supple.  Skin:    General: Skin is warm and dry.     Coloration: Skin is jaundiced.  Neurological:     General: No focal deficit present.     Mental Status: He is alert and oriented to person, place, and time.  Psychiatric:        Mood and  Affect: Mood normal.        Behavior: Behavior normal.     Assessment/Plan: The patient has been experiencing upper abdominal pain with weight loss for the last couple years.  Certainly some of this could be attributed to gallstone pancreatitis.  He has had an ERCP and his liver functions are trending down.  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  This has been biopsied and the pathology is still pending.  If this is malignant it can significantly change how we would approach his biliary system.  At this point we will continue to observe him and wait for the pathology to return.  We will discuss his case with our hepatobiliary specialists and follow him closely with you.  Peter [name] 09/16/2022, 10:03 AM

## 2022-09-16 NOTE — Progress Notes (Signed)
[facility] Gastroenterology Progress Note  Peter [name] 42 y.o. Oct 29, 1980  CC: Elevated LFTs, abdominal pain   Subjective: Patient seen and examined at bedside.  Underwent EUS yesterday with findings suggestive of inflammatory process and less likely from malignancy.  Denies any abdominal pain today.  Tolerating diet.  ROS : afebrile, denies nausea or vomiting.   Objective: Vital signs in last 24 hours: Vitals:   09/15/22 2105 09/16/22 0514  BP: (!) 112/90 114/75  Pulse: 81 69  Resp: 16 18  Temp: (!) 97.5 F (36.4 C) (!) 97.3 F (36.3 C)  SpO2: 97% 98%    Physical Exam:  General:  Alert, cooperative, no distress, appears stated age  Head:  Normocephalic, without obvious abnormality, atraumatic  Eyes:  , EOM's intact,   Lungs:   Clear to auscultation bilaterally, respirations unlabored  Heart:  Regular rate and rhythm, S1, S2 normal  Abdomen:   Soft, non-tender, bowel sounds active all four quadrants,  no masses,   Extremities: Extremities normal, atraumatic, no  edema  Pulses: 2+ and symmetric    Lab Results: Recent Labs    09/14/22 0518 09/15/22 0521  NA 130* 132*  K 4.4 4.0  CL 95* 96*  CO2 24 26  GLUCOSE 215* 269*  BUN 10 8  CREATININE 0.79 1.00  CALCIUM 8.6* 9.0   Recent Labs    09/14/22 0518 09/15/22 0521  AST 458* 205*  ALT 396* 343*  ALKPHOS 278* 314*  BILITOT 5.4* 2.2*  PROT 6.3* 6.9  ALBUMIN 3.0* 3.1*   No results for input(s): "WBC", "NEUTROABS", "HGB", "HCT", "MCV", "PLT" in the last 72 hours. No results for input(s): "LABPROT", "INR" in the last 72 hours.    Assessment/Plan: -Abnormal LFTs with abdominal pain.  LFTs improving.Underwent EUS yesterday which showed irregular mass versus focal inflammation in the pancreatic head measuring around 20 mm with possible invasion of superior mesenteric vein and splenic vein thrombosis.  Findings likely suspected from inflammation and less likely from mass.  -Splenic vein  thrombosis  Recommendations ------------------------- -Follow cytology.  If cytology negative, he will need surgery consult for evaluation for cholecystectomy. -Appreciate hematology/oncology input.  CA 19-9 ordered.  Started on heparin for splenic vein thrombosis, likely will need outpatient anticoagulation depending on the pathology findings. -GI will follow-up on Monday.  Call us back if any question in between.   Kathi Der MD, FACP 09/16/2022, 8:47 AM  Contact #  [phone]

## 2022-09-17 DIAGNOSIS — K8591 Acute pancreatitis with uninfected necrosis, unspecified: Secondary | ICD-10-CM

## 2022-09-17 LAB — CBC
HCT: 40 % (ref 39.0–52.0)
Hemoglobin: 13.4 g/dL (ref 13.0–17.0)
MCH: 34.1 pg — ABNORMAL HIGH (ref 26.0–34.0)
MCHC: 33.5 g/dL (ref 30.0–36.0)
MCV: 101.8 fL — ABNORMAL HIGH (ref 80.0–100.0)
Platelets: 149 10*3/uL — ABNORMAL LOW (ref 150–400)
RBC: 3.93 MIL/uL — ABNORMAL LOW (ref 4.22–5.81)
RDW: 12.7 % (ref 11.5–15.5)
WBC: 6.6 10*3/uL (ref 4.0–10.5)
nRBC: 0 % (ref 0.0–0.2)

## 2022-09-17 LAB — GLUCOSE, CAPILLARY
Glucose-Capillary: 242 mg/dL — ABNORMAL HIGH (ref 70–99)
Glucose-Capillary: 259 mg/dL — ABNORMAL HIGH (ref 70–99)
Glucose-Capillary: 179 mg/dL — ABNORMAL HIGH (ref 70–99)
Glucose-Capillary: 191 mg/dL — ABNORMAL HIGH (ref 70–99)

## 2022-09-17 LAB — HEPARIN LEVEL (UNFRACTIONATED)
Heparin Unfractionated: 0.34 [IU]/mL (ref 0.30–0.70)
Heparin Unfractionated: 0.38 IU/mL (ref 0.30–0.70)

## 2022-09-17 NOTE — Progress Notes (Signed)
ANTICOAGULATION CONSULT NOTE   Pharmacy Consult for heparin  Indication: splenic vein thrombosis  No Known Allergies  Patient Measurements: Height: 5\' 10"  (177.8 cm) Weight: 81 kg (178 lb 9.2 oz) IBW/kg (Calculated) : 73 Heparin Dosing Weight: TBW  Vital Signs: Temp: 97.4 F (36.3 C) (07/28 0629) Temp Source: Oral (07/28 0629) BP: 115/68 (07/28 0629) Pulse Rate: 51 (07/28 0629)  Labs: Recent Labs    09/15/22 0521 09/15/22 2317 09/16/22 0819 09/16/22 1605 09/17/22 0010  HGB  --   --  14.5  --  13.4  HCT  --   --  43.4  --  40.0  PLT  --   --  153  --  149*  APTT  --   --  44*  --   --   HEPARINUNFRC  --    < > 0.20* 0.24* 0.34  CREATININE 1.00  --  0.69  --   --    < > = values in this interval not displayed.    Estimated Creatinine Clearance: 125.5 mL/min (by C-G formula based on SCr of 0.69 mg/dL).  Medications:   heparin 1,900 Units/hr (09/16/22 2223)     Assessment: Patient is a 42 y.o M with hx EtOH who presented to the ED on 09/12/22 with high blood pressure and for workup for COVID.   COVID test came back negative, but he was found to have elevated LFTs. 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  S/p EUS on 09/15/22 with aspiration of pancreatic head. Pharmacy has been consulted s/p procedure to start heparin drip for splenic vein thrombosis.  Today, 09/17/2022:  Heparin level 0.38, therapeutic on heparin 1900 units/hr CBC: Hgb remains WNL, Plt decreased to 149k (baseline 140-160s) No bleeding or complications reported by RN    Goal of Therapy:  Heparin level 0.3-0.7 units/ml Monitor platelets by anticoagulation protocol: Yes   Plan:  Continue heparin IV infusion at 1900 units/hr Daily heparin level and CBC Follow up long term plans for anticoagulation.     Lynann Beaver PharmD, BCPS [facility] main pharmacy [phone] 09/17/2022 7:16 AM

## 2022-09-17 NOTE — Progress Notes (Signed)
2 Days Post-Op   Subjective/Chief Complaint: No complaints   Objective: Vital signs in last 24 hours: Temp:  [97.4 F (36.3 C)-98.2 F (36.8 C)] 97.4 F (36.3 C) (07/28 0629) Pulse Rate:  [51-66] 51 (07/28 0629) Resp:  [14-18] 14 (07/28 0629) BP: (107-118)/(68-73) 115/68 (07/28 0629) SpO2:  [96 %-99 %] 96 % (07/28 0629) Last BM Date : 09/12/22  Intake/Output from previous day: 07/27 0701 - 07/28 0700 In: 667.5 [P.O.:620; I.V.:47.5] Out: -  Intake/Output this shift: No intake/output data recorded.  General appearance: alert and cooperative Resp: clear to auscultation bilaterally Cardio: regular rate and rhythm GI: soft, nontender  Lab Results:  Recent Labs    09/16/22 0819 09/17/22 0010  WBC 9.0 6.6  HGB 14.5 13.4  HCT 43.4 40.0  PLT 153 149*   BMET Recent Labs    09/15/22 0521 09/16/22 0819  NA 132* 133*  K 4.0 4.0  CL 96* 98  CO2 26 23  GLUCOSE 269* 311*  BUN 8 11  CREATININE 1.00 0.69  CALCIUM 9.0 9.0   PT/INR No results for input(s): "LABPROT", "INR" in the last 72 hours. ABG No results for input(s): "PHART", "HCO3" in the last 72 hours.  Invalid input(s): "PCO2", "PO2"  Studies/Results: No results found.  Anti-infectives: Anti-infectives (From admission, onward)    Start     Dose/Rate Route Frequency Ordered Stop   09/15/22 1215  ciprofloxacin (CIPRO) IVPB 400 mg  Status:  Discontinued        400 mg 200 mL/hr over 60 Minutes Intravenous  Once 09/15/22 1214 09/15/22 1431       Assessment/Plan: s/p Procedure(s): UPPER ENDOSCOPIC ULTRASOUND (EUS) LINEAR (Left) FINE NEEDLE ASPIRATION (FNA) LINEAR (N/A) Advance diet Await path for pancreatic mass   LOS: 4 days    Chevis Pretty III 09/17/2022

## 2022-09-17 NOTE — Progress Notes (Signed)
[facility] PROGRESS NOTE    Progress Note  Peter [name]  UXL:[id number] DOB: 03-05-1980 DOA: 09/12/2022 PCP: Pcp, No     Brief Narrative:   Peter [name] is an 42 y.o. male past medical history significant for alcohol abuse, essential hypertension, diabetes mellitus with prior DKA's admitted for hyperglycemia and uncontrolled hypertension and abnormal liver function test.  Of note patient has lost health insurance. Right upper quadrant ultrasound distended gallbladder with stones and sludge, biliary tree was dilated, common bile duct measures 12 mm, no wall thickening. MRCP showed 2  centimeter hypervascular lesion in the head of the pancreas obstructing the portal vein confluence with splenic thrombosis and focal segmental pancreatitis.   Assessment/Plan:   Elevated LFT's: Continue carb modified diet. GI was consulted he scheduled for ERCP done on 09/15/2022 that showed dilated common bile duct few lymph nodes biopsies were taken.   LFTs continue to be elevated. Surgery was consulted who recommended to await pathology report as it would alter the approach of the surgical intervention.  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 They recommend to await biopsy.  Alcohol abuse: With a recent binge drink on 09/11/2022. Continue thiamine and folate. Finishing Librium protocol.  Uncontrolled type 2 diabetes mellitus with hyperglycemia, without long-term current use of insulin (HCC) A1c of 8.9, on admission he was hyperglycemic. Continue sliding scale insulin will add long-acting insulin.  Hypovolemic hyponatremia: KVO IV fluids.  Essential hypertension: Pressure is much improved today. Continue amlodipine and clonidine. Hydralazine IV as needed.    DVT prophylaxis: lovenox Family Communication:none Status is: Observation The patient will require care spanning > 2 midnights and should be moved to inpatient because:  Acute abdominal pain likely due to choledocholithiasis    Code Status:     Code Status Orders  (From admission, onward)           Start     Ordered   09/12/22 1755  Full code  Continuous       Question:  By:  Answer:  Consent: discussion documented in EHR   09/12/22 1756           Code Status History     Date Active Date Inactive Code Status Order ID Comments User Context   10/29/2020 2326 10/31/2020 1746 Full Code [id number]  Rometta Emery, MD ED   10/29/2020 1738 10/29/2020 2326 Full Code [id number]  Placido Sou, PA-C ED   04/04/2020 1154 04/15/2020 1706 Full Code [id number]  Jae Dire, MD ED   01/21/2018 2106 01/23/2018 2101 Full Code [id number]  Teryl Lucy, MD ED   05/20/2017 2023 05/21/2017 1840 Full Code [id number]  Elder Love, MD ED   05/20/2017 1804 05/20/2017 2022 Full Code [id number]  Blane Ohara, MD ED   02/15/2017 0222 02/16/2017 2329 Full Code [id number]  Delano Metz, MD ED   02/14/2017 1413 02/15/2017 0222 Full Code [id number]  Georgiana Shore, PA-C ED   12/02/2016 2021 12/07/2016 1724 Full Code [id number]  Clydie Braun, MD ED         IV Access:   Peripheral IV   Procedures and diagnostic studies:   No results found.   Medical Consultants:   None.   Subjective:    Peter [name] no complaints  Objective:    Vitals:   09/16/22 0514 09/16/22 1338 09/16/22 2035 09/17/22 0629  BP: 114/75 107/73 118/72 115/68  Pulse: 69 66 66 (!) 51  Resp: 18 18 14  14  Temp: (!) 97.3 F (36.3 C) (!) 97.5 F (36.4 C) 98.2 F (36.8 C) (!) 97.4 F (36.3 C)  TempSrc: Oral Oral Oral Oral  SpO2: 98% 99% 99% 96%  Weight:      Height:       SpO2: 96 %   Intake/Output Summary (Last 24 hours) at 09/17/2022 0909 Last data filed at 09/16/2022 2000 Gross per 24 hour  Intake 427.5 ml  Output --  Net 427.5 ml   Filed Weights   09/12/22 1320 09/12/22 1841 09/15/22 1201  Weight: 83.9 kg 81 kg 81 kg    Exam: General exam: In no  acute distress. Respiratory system: Good air movement and clear to auscultation. Cardiovascular system: S1 & S2 heard, RRR. No JVD. Gastrointestinal system: Abdomen is nondistended, soft and nontender.  Extremities: No pedal edema. Skin: No rashes, lesions or ulcers Psychiatry: Judgement and insight appear normal. Mood & affect appropriate. Data Reviewed:    Labs: Basic Metabolic Panel: Recent Labs  Lab 09/12/22 1341 09/13/22 0302 09/14/22 0518 09/15/22 0521 09/16/22 0819  NA 130* 130* 130* 132* 133*  K 3.7 3.6 4.4 4.0 4.0  CL 92* 94* 95* 96* 98  CO2 22 25 24 26 23   GLUCOSE 445* 269* 215* 269* 311*  BUN 7 7 10 8 11   CREATININE 0.78 0.73 0.79 1.00 0.69  CALCIUM 9.3 8.8* 8.6* 9.0 9.0   GFR Estimated Creatinine Clearance: 125.5 mL/min (by C-G formula based on SCr of 0.69 mg/dL). Liver Function Tests: Recent Labs  Lab 09/12/22 1341 09/13/22 0302 09/14/22 0518 09/15/22 0521 09/16/22 0819  AST 675* 491* 458* 205* 48*  ALT 480* 440* 396* 343* 220*  ALKPHOS 342* 299* 278* 314* 273*  BILITOT 5.3* 7.2* 5.4* 2.2* 1.8*  PROT 7.9 7.0 6.3* 6.9 6.9  ALBUMIN 3.8 3.4* 3.0* 3.1* 3.2*   Recent Labs  Lab 09/13/22 0301  LIPASE 103*   No results for input(s): "AMMONIA" in the last 168 hours. Coagulation profile Recent Labs  Lab 09/12/22 1529 09/13/22 0302  INR 1.0 1.1   COVID-19 Labs  No results for input(s): "DDIMER", "FERRITIN", "LDH", "CRP" in the last 72 hours.   Lab Results  Component Value Date   SARSCOV2NAA NEGATIVE 09/12/2022   SARSCOV2NAA NEGATIVE 10/30/2020   SARSCOV2NAA NEGATIVE 04/04/2020    CBC: Recent Labs  Lab 09/12/22 1341 09/13/22 0302 09/16/22 0819 09/17/22 0010  WBC 9.1 6.7 9.0 6.6  NEUTROABS 7.6  --   --   --   HGB 16.8 15.1 14.5 13.4  HCT 45.9 42.4 43.4 40.0  MCV 94.6 98.1 102.6* 101.8*  PLT 165 144* 153 149*   Cardiac Enzymes: No results for input(s): "CKTOTAL", "CKMB", "CKMBINDEX", "TROPONINI" in the last 168 hours. BNP (last 3  results) No results for input(s): "PROBNP" in the last 8760 hours. CBG: Recent Labs  Lab 09/16/22 0731 09/16/22 1128 09/16/22 1629 09/16/22 2146 09/17/22 0736  GLUCAP 299* 318* 237* 243* 242*   D-Dimer: No results for input(s): "DDIMER" in the last 72 hours.  Hgb A1c: No results for input(s): "HGBA1C" in the last 72 hours.  Lipid Profile: No results for input(s): "CHOL", "HDL", "LDLCALC", "TRIG", "CHOLHDL", "LDLDIRECT" in the last 72 hours. Thyroid function studies: No results for input(s): "TSH", "T4TOTAL", "T3FREE", "THYROIDAB" in the last 72 hours.  Invalid input(s): "FREET3" Anemia work up: No results for input(s): "VITAMINB12", "FOLATE", "FERRITIN", "TIBC", "IRON", "RETICCTPCT" in the last 72 hours. Sepsis Labs: Recent Labs  Lab 09/12/22 1341 09/12/22 1529 09/13/22 0302 09/16/22 0819 09/17/22  0010  WBC 9.1  --  6.7 9.0 6.6  LATICACIDVEN  --  1.5  --   --   --    Microbiology Recent Results (from the past 240 hour(s))  Resp panel by RT-PCR (RSV, Flu A&B, Covid) Anterior Nasal Swab     Status: None   Collection Time: 09/12/22  1:24 PM   Specimen: Anterior Nasal Swab  Result Value Ref Range Status   SARS Coronavirus 2 by RT PCR NEGATIVE NEGATIVE Final    Comment: (NOTE) SARS-CoV-2 target nucleic acids are NOT DETECTED.  The SARS-CoV-2 RNA is generally detectable in upper respiratory specimens during the acute phase of infection. The lowest concentration of SARS-CoV-2 viral copies this assay can detect is 138 copies/mL. A negative result does not preclude SARS-Cov-2 infection and should not be used as the sole basis for treatment or other patient management decisions. A negative result may occur with  improper specimen collection/handling, submission of specimen other than nasopharyngeal swab, presence of viral mutation(s) within the areas targeted by this assay, and inadequate number of viral copies(<138 copies/mL). A negative result must be combined  with clinical observations, patient history, and epidemiological information. The expected result is Negative.  Fact Sheet for Patients:  BloggerCourse.com  Fact Sheet for Healthcare Providers:  SeriousBroker.it  This test is no t yet approved or cleared by the [place] FDA and  has been authorized for detection and/or diagnosis of SARS-CoV-2 by FDA under an Emergency Use Authorization (EUA). This EUA will remain  in effect (meaning this test can be used) for the duration of the COVID-19 declaration under Section 564(b)(1) of the Act, 21 U.S.C.section 360bbb-3(b)(1), unless the authorization is terminated  or revoked sooner.       Influenza A by PCR NEGATIVE NEGATIVE Final   Influenza B by PCR NEGATIVE NEGATIVE Final    Comment: (NOTE) The Xpert Xpress SARS-CoV-2/FLU/RSV plus assay is intended as an aid in the diagnosis of influenza from Nasopharyngeal swab specimens and should not be used as a sole basis for treatment. Nasal washings and aspirates are unacceptable for Xpert Xpress SARS-CoV-2/FLU/RSV testing.  Fact Sheet for Patients: BloggerCourse.com  Fact Sheet for Healthcare Providers: SeriousBroker.it  This test is not yet approved or cleared by the [place] FDA and has been authorized for detection and/or diagnosis of SARS-CoV-2 by FDA under an Emergency Use Authorization (EUA). This EUA will remain in effect (meaning this test can be used) for the duration of the COVID-19 declaration under Section 564(b)(1) of the Act, 21 U.S.C. section 360bbb-3(b)(1), unless the authorization is terminated or revoked.     Resp Syncytial Virus by PCR NEGATIVE NEGATIVE Final    Comment: (NOTE) Fact Sheet for Patients: BloggerCourse.com  Fact Sheet for Healthcare Providers: SeriousBroker.it  This test is not yet approved  or cleared by the [place] FDA and has been authorized for detection and/or diagnosis of SARS-CoV-2 by FDA under an Emergency Use Authorization (EUA). This EUA will remain in effect (meaning this test can be used) for the duration of the COVID-19 declaration under Section 564(b)(1) of the Act, 21 U.S.C. section 360bbb-3(b)(1), unless the authorization is terminated or revoked.  Performed at [facility], [address], [place], [place] [address]   MRSA Next Gen by PCR, Nasal     Status: None   Collection Time: 09/12/22  6:51 PM   Specimen: Nasal Mucosa; Nasal Swab  Result Value Ref Range Status   MRSA by PCR Next Gen NOT DETECTED NOT  DETECTED Final    Comment: (NOTE) The GeneXpert MRSA Assay (FDA approved for NASAL specimens only), is one component of a comprehensive MRSA colonization surveillance program. It is not intended to diagnose MRSA infection nor to guide or monitor treatment for MRSA infections. Test performance is not FDA approved in patients less than 30 years old. Performed at [facility], [address] [place], [place] [address]      Medications:    amLODipine  10 mg Oral Daily   cloNIDine  0.2 mg Oral BID   folic acid  1 mg Oral Daily   insulin aspart  0-15 Units Subcutaneous TID WC   insulin aspart  0-5 Units Subcutaneous QHS   insulin aspart  6 Units Subcutaneous TID WC   insulin detemir  20 Units Subcutaneous BID   multivitamin with minerals  1 tablet Oral Daily   thiamine  100 mg Oral Daily   Or   thiamine  100 mg Intravenous Daily   Continuous Infusions:  heparin 1,900 Units/hr (09/16/22 2223)      LOS: 4 days   Marinda Elk  [facility]  09/17/2022, 9:09 AM

## 2022-09-17 NOTE — Progress Notes (Signed)
ANTICOAGULATION CONSULT NOTE   Pharmacy Consult for heparin  Indication: splenic vein thrombosis  No Known Allergies  Patient Measurements: Height: 5\' 10"  (177.8 cm) Weight: 81 kg (178 lb 9.2 oz) IBW/kg (Calculated) : 73 Heparin Dosing Weight: TBW  Vital Signs: Temp: 98.2 F (36.8 C) (07/27 2035) Temp Source: Oral (07/27 2035) BP: 118/72 (07/27 2035) Pulse Rate: 66 (07/27 2035)  Labs: Recent Labs    09/14/22 0518 09/15/22 0521 09/15/22 2317 09/16/22 0819 09/16/22 1605 09/17/22 0010  HGB  --   --   --  14.5  --  13.4  HCT  --   --   --  43.4  --  40.0  PLT  --   --   --  153  --  149*  APTT  --   --   --  44*  --   --   HEPARINUNFRC  --   --    < > 0.20* 0.24* 0.34  CREATININE 0.79 1.00  --  0.69  --   --    < > = values in this interval not displayed.    Estimated Creatinine Clearance: 125.5 mL/min (by C-G formula based on SCr of 0.69 mg/dL).  Medications:   heparin 1,900 Units/hr (09/16/22 2223)     Assessment: Patient is a 42 y.o M with hx EtOH who presented to the ED on 09/12/22 with high blood pressure and for workup for COVID.   COVID test came back negative, but he was found to have elevated LFTs. 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  S/p EUS on 09/15/22 with aspiration of pancreatic head. Pharmacy has been consulted s/p procedure to start heparin drip for splenic vein thrombosis.  Today, 09/17/2022:  HL 0.34 therapeutic on 1900 units/hr Hgb 13.4, plts 149 No bleeding noted  Goal of Therapy:  Heparin level 0.3-0.7 units/ml Monitor platelets by anticoagulation protocol: Yes   Plan:  Continue heparin drip at 1900 units/hr Confirmatory level in 6 hours Daily heparin level and CBC Follow up long term plans for anticoagulation.     Arley Phenix RPh 09/17/2022, 1:27 AM

## 2022-09-18 ENCOUNTER — Encounter (HOSPITAL_COMMUNITY): Payer: Self-pay | Admitting: Gastroenterology

## 2022-09-18 LAB — GLUCOSE, CAPILLARY
Glucose-Capillary: 216 mg/dL — ABNORMAL HIGH (ref 70–99)
Glucose-Capillary: 153 mg/dL — ABNORMAL HIGH (ref 70–99)
Glucose-Capillary: 137 mg/dL — ABNORMAL HIGH (ref 70–99)
Glucose-Capillary: 88 mg/dL (ref 70–99)

## 2022-09-18 LAB — HEPARIN LEVEL (UNFRACTIONATED)
Heparin Unfractionated: 0.4 IU/mL (ref 0.30–0.70)
Heparin Unfractionated: 0.33 IU/mL (ref 0.30–0.70)

## 2022-09-18 MED ORDER — INSULIN DETEMIR 100 UNIT/ML ~~LOC~~ SOLN
30.0000 [IU] | Freq: Two times a day (BID) | SUBCUTANEOUS | Status: DC
  Administered 2022-09-18 – 2022-09-21 (×7): 30 [IU] via SUBCUTANEOUS
  Filled 2022-09-18 (×8): qty 0.3

## 2022-09-18 MED ORDER — INSULIN ASPART 100 UNIT/ML IJ SOLN
8.0000 [IU] | Freq: Three times a day (TID) | INTRAMUSCULAR | Status: DC
  Administered 2022-09-18 – 2022-09-21 (×11): 8 [IU] via SUBCUTANEOUS

## 2022-09-18 MED ORDER — LIVING WELL WITH DIABETES BOOK
Freq: Once | Status: AC
  Filled 2022-09-18: qty 1

## 2022-09-18 NOTE — Progress Notes (Signed)
[facility] PROGRESS NOTE    Progress Note  Peter [name]  ZOX:[id number] DOB: [birth year] DOA: 09/12/2022 PCP: Pcp, No     Brief Narrative:   Peter [name] is an 42 y.o. male past medical history significant for alcohol abuse, essential hypertension, diabetes mellitus with prior DKA's admitted for hyperglycemia and uncontrolled hypertension and abnormal liver function test.  Of note patient has lost health insurance. Right upper quadrant ultrasound distended gallbladder with stones and sludge, biliary tree was dilated, common bile duct measures 12 mm, no wall thickening. MRCP showed 2  centimeter hypervascular lesion in the head of the pancreas obstructing the portal vein confluence with splenic thrombosis and focal segmental pancreatitis.   Assessment/Plan:   Elevated LFT's with an abnormal MRCP concerning for pancreatic mass: ERCP done on 09/15/2022 that showed dilated common bile duct few lymph nodes biopsies were taken.   Surgery was consulted who recommended to await pathology report as it would alter the approach of the surgical intervention. There is concerning the CA 19 9 is 133. Biopsies are still pending.  Splenic vein thrombosis: Continue IV heparin. Oncology is recommending to await biopsy.  Alcohol abuse: With a recent binge drink on 09/11/2022. Continue thiamine and folate. Finishing Librium protocol.  Uncontrolled type 2 diabetes mellitus with hyperglycemia, without long-term current use of insulin (HCC) A1c of 8.9, on admission he was hyperglycemic. Continue sliding scale insulin will add long-acting insulin.  Hypovolemic hyponatremia: KVO IV fluids.  Essential hypertension: Pressure is much improved today. Continue amlodipine and clonidine. Hydralazine IV as needed.    DVT prophylaxis: lovenox Family Communication:none Status is: Observation The patient will require care spanning > 2 midnights and should be moved to inpatient because:  Acute abdominal pain likely due to choledocholithiasis    Code Status:     Code Status Orders  (From admission, onward)           Start     Ordered   09/12/22 1755  Full code  Continuous       Question:  By:  Answer:  Consent: discussion documented in EHR   09/12/22 1756           Code Status History     Date Active Date Inactive Code Status Order ID Comments User Context   10/29/2020 2326 10/31/2020 1746 Full Code [id number]  Rometta Emery, MD ED   10/29/2020 1738 10/29/2020 2326 Full Code [id number]  Placido Sou, PA-C ED   04/04/2020 1154 04/15/2020 1706 Full Code [id number]  Jae Dire, MD ED   01/21/2018 2106 01/23/2018 2101 Full Code [id number]  Teryl Lucy, MD ED   05/20/2017 2023 05/21/2017 1840 Full Code [id number]  Elder Love, MD ED   05/20/2017 1804 05/20/2017 2022 Full Code [id number]  Blane Ohara, MD ED   02/15/2017 0222 02/16/2017 2329 Full Code [id number]  Delano Metz, MD ED   02/14/2017 1413 02/15/2017 0222 Full Code [id number]  Georgiana Shore, PA-C ED   12/02/2016 2021 12/07/2016 1724 Full Code [id number]  Clydie Braun, MD ED         IV Access:   Peripheral IV   Procedures and diagnostic studies:   No results found.   Medical Consultants:   None.   Subjective:    Peter [name] no complaints.  Objective:    Vitals:   09/17/22 1010 09/17/22 1346 09/17/22 2139 09/18/22 0520  BP: 108/74 111/73 109/77 123/84  Pulse:  (!) 57 65 (!) 50  Resp:  16  18 14  Temp:   98.1 F (36.7 C) (!) 97.5 F (36.4 C)  TempSrc:   Oral Oral  SpO2:  99% 95% 98%  Weight:      Height:       SpO2: 98 %   Intake/Output Summary (Last 24 hours) at 09/18/2022 0748 Last data filed at 09/18/2022 0400 Gross per 24 hour  Intake 1213.99 ml  Output 1 ml  Net 1212.99 ml   Filed Weights   09/12/22 1320 09/12/22 1841 09/15/22 1201  Weight: 83.9 kg 81 kg 81 kg    Exam: General exam: In no acute distress. Respiratory system: Good air  movement and clear to auscultation. Cardiovascular system: S1 & S2 heard, RRR. No JVD. Gastrointestinal system: Abdomen is nondistended, soft and nontender.  Extremities: No pedal edema. Skin: No rashes, lesions or ulcers Psychiatry: Judgement and insight appear normal. Mood & affect appropriate. Data Reviewed:    Labs: Basic Metabolic Panel: Recent Labs  Lab 09/12/22 1341 09/13/22 0302 09/14/22 0518 09/15/22 0521 09/16/22 0819  NA 130* 130* 130* 132* 133*  K 3.7 3.6 4.4 4.0 4.0  CL 92* 94* 95* 96* 98  CO2 22 25 24 26 23   GLUCOSE 445* 269* 215* 269* 311*  BUN 7 7 10 8 11   CREATININE 0.78 0.73 0.79 1.00 0.69  CALCIUM 9.3 8.8* 8.6* 9.0 9.0   GFR Estimated Creatinine Clearance: 125.5 mL/min (by C-G formula based on SCr of 0.69 mg/dL). Liver Function Tests: Recent Labs  Lab 09/12/22 1341 09/13/22 0302 09/14/22 0518 09/15/22 0521 09/16/22 0819  AST 675* 491* 458* 205* 48*  ALT 480* 440* 396* 343* 220*  ALKPHOS 342* 299* 278* 314* 273*  BILITOT 5.3* 7.2* 5.4* 2.2* 1.8*  PROT 7.9 7.0 6.3* 6.9 6.9  ALBUMIN 3.8 3.4* 3.0* 3.1* 3.2*   Recent Labs  Lab 09/13/22 0301  LIPASE 103*   No results for input(s): "AMMONIA" in the last 168 hours. Coagulation profile Recent Labs  Lab 09/12/22 1529 09/13/22 0302  INR 1.0 1.1   COVID-19 Labs  No results for input(s): "DDIMER", "FERRITIN", "LDH", "CRP" in the last 72 hours.   Lab Results  Component Value Date   SARSCOV2NAA NEGATIVE 09/12/2022   SARSCOV2NAA NEGATIVE 10/30/2020   SARSCOV2NAA NEGATIVE 04/04/2020    CBC: Recent Labs  Lab 09/12/22 1341 09/13/22 0302 09/16/22 0819 09/17/22 0010 09/18/22 0551  WBC 9.1 6.7 9.0 6.6 4.6  NEUTROABS 7.6  --   --   --   --   HGB 16.8 15.1 14.5 13.4 14.1  HCT 45.9 42.4 43.4 40.0 42.1  MCV 94.6 98.1 102.6* 101.8* 103.4*  PLT 165 144* 153 149* 148*   Cardiac Enzymes: No results for input(s): "CKTOTAL", "CKMB", "CKMBINDEX", "TROPONINI" in the last 168 hours. BNP (last 3  results) No results for input(s): "PROBNP" in the last 8760 hours. CBG: Recent Labs  Lab 09/17/22 0736 09/17/22 1118 09/17/22 1626 09/17/22 2142 09/18/22 0727  GLUCAP 242* 259* 179* 191* 216*   D-Dimer: No results for input(s): "DDIMER" in the last 72 hours.  Hgb A1c: No results for input(s): "HGBA1C" in the last 72 hours.  Lipid Profile: No results for input(s): "CHOL", "HDL", "LDLCALC", "TRIG", "CHOLHDL", "LDLDIRECT" in the last 72 hours. Thyroid function studies: No results for input(s): "TSH", "T4TOTAL", "T3FREE", "THYROIDAB" in the last 72 hours.  Invalid input(s): "FREET3" Anemia work up: No results for input(s): "VITAMINB12", "FOLATE", "FERRITIN", "TIBC", "IRON", "RETICCTPCT" in the last 72 hours. Sepsis Labs: Recent Labs  Lab 09/12/22 1529 09/13/22  0302 09/16/22 0819 09/17/22 0010 09/18/22 0551  WBC  --  6.7 9.0 6.6 4.6  LATICACIDVEN 1.5  --   --   --   --    Microbiology Recent Results (from the past 240 hour(s))  Resp panel by RT-PCR (RSV, Flu A&B, Covid) Anterior Nasal Swab     Status: None   Collection Time: 09/12/22  1:24 PM   Specimen: Anterior Nasal Swab  Result Value Ref Range Status   SARS Coronavirus 2 by RT PCR NEGATIVE NEGATIVE Final    Comment: (NOTE) SARS-CoV-2 target nucleic acids are NOT DETECTED.  The SARS-CoV-2 RNA is generally detectable in upper respiratory specimens during the acute phase of infection. The lowest concentration of SARS-CoV-2 viral copies this assay can detect is 138 copies/mL. A negative result does not preclude SARS-Cov-2 infection and should not be used as the sole basis for treatment or other patient management decisions. A negative result may occur with  improper specimen collection/handling, submission of specimen other than nasopharyngeal swab, presence of viral mutation(s) within the areas targeted by this assay, and inadequate number of viral copies(<138 copies/mL). A negative result must be combined  with clinical observations, patient history, and epidemiological information. The expected result is Negative.  Fact Sheet for Patients:  BloggerCourse.com  Fact Sheet for Healthcare Providers:  SeriousBroker.it  This test is no t yet approved or cleared by the [place] FDA and  has been authorized for detection and/or diagnosis of SARS-CoV-2 by FDA under an Emergency Use Authorization (EUA). This EUA will remain  in effect (meaning this test can be used) for the duration of the COVID-19 declaration under Section 564(b)(1) of the Act, 21 U.S.C.section 360bbb-3(b)(1), unless the authorization is terminated  or revoked sooner.       Influenza A by PCR NEGATIVE NEGATIVE Final   Influenza B by PCR NEGATIVE NEGATIVE Final    Comment: (NOTE) The Xpert Xpress SARS-CoV-2/FLU/RSV plus assay is intended as an aid in the diagnosis of influenza from Nasopharyngeal swab specimens and should not be used as a sole basis for treatment. Nasal washings and aspirates are unacceptable for Xpert Xpress SARS-CoV-2/FLU/RSV testing.  Fact Sheet for Patients: BloggerCourse.com  Fact Sheet for Healthcare Providers: SeriousBroker.it  This test is not yet approved or cleared by the [place] FDA and has been authorized for detection and/or diagnosis of SARS-CoV-2 by FDA under an Emergency Use Authorization (EUA). This EUA will remain in effect (meaning this test can be used) for the duration of the COVID-19 declaration under Section 564(b)(1) of the Act, 21 U.S.C. section 360bbb-3(b)(1), unless the authorization is terminated or revoked.     Resp Syncytial Virus by PCR NEGATIVE NEGATIVE Final    Comment: (NOTE) Fact Sheet for Patients: BloggerCourse.com  Fact Sheet for Healthcare Providers: SeriousBroker.it  This test is not yet approved  or cleared by the [place] FDA and has been authorized for detection and/or diagnosis of SARS-CoV-2 by FDA under an Emergency Use Authorization (EUA). This EUA will remain in effect (meaning this test can be used) for the duration of the COVID-19 declaration under Section 564(b)(1) of the Act, 21 U.S.C. section 360bbb-3(b)(1), unless the authorization is terminated or revoked.  Performed at [facility], [address] [place], [place] [address]   MRSA Next Gen by PCR, Nasal     Status: None   Collection Time: 09/12/22  6:51 PM   Specimen: Nasal Mucosa; Nasal Swab  Result Value Ref Range Status   MRSA by  PCR Next Gen NOT DETECTED NOT DETECTED Final    Comment: (NOTE) The GeneXpert MRSA Assay (FDA approved for NASAL specimens only), is one component of a comprehensive MRSA colonization surveillance program. It is not intended to diagnose MRSA infection nor to guide or monitor treatment for MRSA infections. Test performance is not FDA approved in patients less than 88 years old. Performed at [facility], [address] [place], [place] [address]      Medications:    amLODipine  10 mg Oral Daily   cloNIDine  0.2 mg Oral BID   folic acid  1 mg Oral Daily   insulin aspart  0-15 Units Subcutaneous TID WC   insulin aspart  0-5 Units Subcutaneous QHS   insulin aspart  6 Units Subcutaneous TID WC   insulin detemir  20 Units Subcutaneous BID   multivitamin with minerals  1 tablet Oral Daily   thiamine  100 mg Oral Daily   Or   thiamine  100 mg Intravenous Daily   Continuous Infusions:  heparin 1,900 Units/hr (09/18/22 0212)      LOS: 5 days   Marinda Elk  [facility]  09/18/2022, 7:48 AM

## 2022-09-18 NOTE — Progress Notes (Signed)
ANTICOAGULATION CONSULT NOTE   Pharmacy Consult for heparin  Indication: splenic vein thrombosis  No Known Allergies  Patient Measurements: Height: 5\' 10"  (177.8 cm) Weight: 81 kg (178 lb 9.2 oz) IBW/kg (Calculated) : 73 Heparin Dosing Weight: TBW  Vital Signs: Temp: 97.5 F (36.4 C) (07/29 0520) Temp Source: Oral (07/29 0520) BP: 123/84 (07/29 0520) Pulse Rate: 50 (07/29 0520)  Labs: Recent Labs    09/16/22 0819 09/16/22 1605 09/17/22 0010 09/17/22 0731 09/18/22 0551  HGB 14.5  --  13.4  --  14.1  HCT 43.4  --  40.0  --  42.1  PLT 153  --  149*  --  148*  APTT 44*  --   --   --   --   HEPARINUNFRC 0.20*   < > 0.34 0.38 0.27*  CREATININE 0.69  --   --   --   --    < > = values in this interval not displayed.    Estimated Creatinine Clearance: 125.5 mL/min (by C-G formula based on SCr of 0.69 mg/dL).  Medications:   heparin 1,900 Units/hr (09/18/22 3557)     Assessment: Patient is a 42 y.o M with hx EtOH who presented to the ED on 09/12/22 with high blood pressure and for workup for COVID. COVID test came back negative, but he was found to have elevated LFTs. 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 S/p EUS on 09/15/22 with aspiration of pancreatic head. Pharmacy has been consulted s/p procedure to start heparin drip for splenic vein thrombosis.  Today, 09/18/2022:  Heparin level 0.27, subtherapeutic on heparin at 1900 units/hr CBC: Hgb remains WNL, Plt consistent with baseline No bleeding or complications reported by RN  Remains on heparin pending results of biopsy   Goal of Therapy:  Heparin level 0.3-0.7 units/ml Monitor platelets by anticoagulation protocol: Yes   Plan:  -Increase heparin infusion to 2050 units/hr -Recheck HL ~ 6 hrs -Daily heparin level and CBC   Pricilla Riffle, PharmD, BCPS Clinical Pharmacist 09/18/2022 7:54 AM

## 2022-09-18 NOTE — Progress Notes (Signed)
Diabetic coordinator has been in to teach the patient and will be back in the morning to continue her teaching.

## 2022-09-18 NOTE — Plan of Care (Signed)
  Problem: Education: Goal: Knowledge of General Education information will improve Description: Including pain rating scale, medication(s)/side effects and non-pharmacologic comfort measures Outcome: Progressing   Problem: Health Behavior/Discharge Planning: Goal: Ability to manage health-related needs will improve Outcome: Progressing   Problem: Clinical Measurements: Goal: Ability to maintain clinical measurements within normal limits will improve Outcome: Progressing Goal: Will remain free from infection Outcome: Progressing Goal: Diagnostic test results will improve Outcome: Progressing   Problem: Pain Managment: Goal: General experience of comfort will improve Outcome: Progressing   Problem: Safety: Goal: Ability to remain free from injury will improve Outcome: Progressing   Problem: Education: Goal: Ability to describe self-care measures that may prevent or decrease complications (Diabetes Survival Skills Education) will improve Outcome: Progressing Goal: Individualized Educational Video(s) Outcome: Progressing   Problem: Coping: Goal: Ability to adjust to condition or change in health will improve Outcome: Progressing

## 2022-09-18 NOTE — Inpatient Diabetes Management (Signed)
Inpatient Diabetes Program Recommendations  AACE/ADA: New Consensus Statement on Inpatient Glycemic Control (2015)  Target Ranges:  Prepandial:   less than 140 mg/dL      Peak postprandial:   less than 180 mg/dL (1-2 hours)      Critically ill patients:  140 - 180 mg/dL    Latest Reference Range & Units 09/17/22 07:36 09/17/22 11:18 09/17/22 16:26 09/17/22 21:42  Glucose-Capillary 70 - 99 mg/dL 161 (H) 096 (H) 045 (H) 191 (H)  (H): Data is abnormally high  Latest Reference Range & Units 09/18/22 07:27  Glucose-Capillary 70 - 99 mg/dL 409 (H)  (H): Data is abnormally high   Home DM Meds: Metformin 1000 mg BID (NOT taking)                             Glipizide 5 mg BID (NOT taking)   Current Orders: Novolog Moderate Correction Scale/ SSI (0-15 units) TID AC + HS                           Levemir 30 units BID      Novolog 8 units TID with meals    Note Insulins increased this AM  Awaiting Pathology reports from Biopsy last week   Met w/ pt this AM to discuss his current in-hospital CBGs and his current need for insulin.  Explained the 2 different insulins we are giving and discussed w/ pt how the RNs are dosing the Novolog.  I spoke w/ pt about his current A1c last week and we re-reviewed his A1c again today.  Discussed w/ pt that given his need for insulin in the hospital (getting 60 units total basal insulin + >30 units Novolog) that he may need insulin when he goes home.  Pt open and agreeable to insulin if needed.  Discussed w/ pt that I will return tomorrow 7/30 to review how to use insulin pen and I have asked RNs caring for pt to allow him to self inject his insulin doses for practice.    --Will follow patient during hospitalization--  Ambrose Finland RN, MSN, CDCES Diabetes Coordinator Inpatient Glycemic Control Team Team Pager: [phone] (8a-5p)

## 2022-09-18 NOTE — Progress Notes (Signed)
ANTICOAGULATION CONSULT NOTE   Pharmacy Consult for heparin  Indication: splenic vein thrombosis  No Known Allergies  Patient Measurements: Height: 5\' 10"  (177.8 cm) Weight: 81 kg (178 lb 9.2 oz) IBW/kg (Calculated) : 73 Heparin Dosing Weight: TBW  Vital Signs: Temp: 98 F (36.7 C) (07/29 2112) Temp Source: Oral (07/29 2112) BP: 107/69 (07/29 2112) Pulse Rate: 57 (07/29 2112)  Labs: Recent Labs    09/16/22 0819 09/16/22 1605 09/17/22 0010 09/17/22 0731 09/18/22 0551 09/18/22 1444 09/18/22 2054  HGB 14.5  --  13.4  --  14.1  --   --   HCT 43.4  --  40.0  --  42.1  --   --   PLT 153  --  149*  --  148*  --   --   APTT 44*  --   --   --   --   --   --   HEPARINUNFRC 0.20*   < > 0.34   < > 0.27* 0.40 0.33  CREATININE 0.69  --   --   --   --   --   --    < > = values in this interval not displayed.    Estimated Creatinine Clearance: 125.5 mL/min (by C-G formula based on SCr of 0.69 mg/dL).  Medications:   heparin 2,050 Units/hr (09/18/22 1703)     Assessment: Patient is a 42 y.o M with hx EtOH who presented to the ED on 09/12/22 with high blood pressure and for workup for COVID. COVID test came back negative, but he was found to have elevated LFTs. 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 S/p EUS on 09/15/22 with aspiration of pancreatic head. Pharmacy has been consulted s/p procedure to start heparin drip for splenic vein thrombosis.  Today, 09/18/2022:  20:54 Heparin level 0.33, therapeutic on heparin at 2050 units/hr CBC: Hgb remains WNL, Plt consistent with baseline No bleeding or complications reported by RN  Remains on heparin pending results of biopsy   Goal of Therapy:  Heparin level 0.3-0.7 units/ml Monitor platelets by anticoagulation protocol: Yes   Plan:  -Continue heparin infusion at 2050 units/hr -Daily heparin level and CBC -F/U bx results, long-term anticoagulation plans   Junita Push, PharmD, BCPS Clinical  Pharmacist 09/18/2022 10:20 PM

## 2022-09-18 NOTE — Plan of Care (Signed)

## 2022-09-18 NOTE — Progress Notes (Signed)
So far, everything is doing fairly well.  He is on heparin right now.  He is doing well on the heparin.  Unfortunately, there is no labs yet this morning to see how his LFTs are.  I am little bit worried however by the fact that his CA 19-9 was elevated at 133.  This certainly is somewhat concerning for the fact that this may be malignancy that we are looking at.  We still do not have back the biopsy results.  I would not think that this will come back until tomorrow.  He is eating.  He is having no nausea or vomiting.  There is still a little bit of abdominal pain.  He is having no problems with bowels or bladder.  There is no bleeding with him on heparin.  I think we will going to have to keep him on heparin until we see what the biopsy is.  If he has a positive biopsy for malignancy, then I would think that he really would need to be seen by surgery to see if they would do a Whipple procedure on him and resect out the tumor initially.  He has had no fever.  There is no cough.  His vital signs are temperature 97.5.  Pulse 50.  Blood pressure 123/84.  His head and neck exam shows no ocular or oral lesions.  I do not see any obvious scleral icterus.  Lungs are clear bilaterally.  Cardiac exam regular rate and rhythm.  He has no murmurs, rubs or bruits.  Abdomen is soft.  His bowel sounds are present.  There is no guarding or rebound tenderness.  There is no fluid wave.  There is no palpable hepatomegaly.  Extremities shows no clubbing, cyanosis or edema.  I think the real issue now is whether or not Peter [name] has a malignancy.  The CA 19-9 is certainly concerning.  I know this can be elevated because of biliary obstruction.  I know it can also be elevated secondary to pancreatitis.  The biopsy will be critical.  Again, I would just keep him on the heparin until the biopsy result comes back.  I do appreciate the great care is getting from everybody up on 6E.  Christin Bach, MD  Romans 1:16

## 2022-09-18 NOTE — Progress Notes (Signed)
Subjective: Patient reports resolution of pain. He has been able to tolerate diet without nausea or vomiting. He has been having bowel movements.  Objective: Vital signs in last 24 hours: Temp:  [97.5 F (36.4 C)-98.1 F (36.7 C)] 97.5 F (36.4 C) (07/29 0520) Pulse Rate:  [50-65] 50 (07/29 0520) Resp:  [14-18] 14 (07/29 0520) BP: (109-123)/(73-84) 123/84 (07/29 0520) SpO2:  [95 %-99 %] 98 % (07/29 0520) Weight change:  Last BM Date : 09/17/22  PE: Not not in distress GENERAL: Not icteric, no pallor  ABDOMEN: Soft, nondistended, nontender, normoactive bowel sounds EXTREMITIES: No deformity   Lab Results: Results for orders placed or performed during the hospital encounter of 09/12/22 (from the past 48 hour(s))  Glucose, capillary     Status: Abnormal   Collection Time: 09/16/22 11:28 AM  Result Value Ref Range   Glucose-Capillary 318 (H) 70 - 99 mg/dL    Comment: Glucose reference range applies only to samples taken after fasting for at least 8 hours.  Heparin level (unfractionated)     Status: Abnormal   Collection Time: 09/16/22  4:05 PM  Result Value Ref Range   Heparin Unfractionated 0.24 (L) 0.30 - 0.70 IU/mL    Comment: (NOTE) The clinical reportable range upper limit is being lowered to >1.10 to align with the FDA approved guidance for the current laboratory assay.  If heparin results are below expected values, and patient dosage has  been confirmed, suggest follow up testing of antithrombin III levels. Performed at [facility], [address] [place], [place] [address]   Glucose, capillary     Status: Abnormal   Collection Time: 09/16/22  4:29 PM  Result Value Ref Range   Glucose-Capillary 237 (H) 70 - 99 mg/dL    Comment: Glucose reference range applies only to samples taken after fasting for at least 8 hours.  Glucose, capillary     Status: Abnormal   Collection Time: 09/16/22  9:46 PM  Result Value Ref Range   Glucose-Capillary 243  (H) 70 - 99 mg/dL    Comment: Glucose reference range applies only to samples taken after fasting for at least 8 hours.  CBC     Status: Abnormal   Collection Time: 09/17/22 12:10 AM  Result Value Ref Range   WBC 6.6 4.0 - 10.5 K/uL   RBC 3.93 (L) 4.22 - 5.81 MIL/uL   Hemoglobin 13.4 13.0 - 17.0 g/dL   HCT 60.4 54.0 - 98.1 %   MCV 101.8 (H) 80.0 - 100.0 fL   MCH 34.1 (H) 26.0 - 34.0 pg   MCHC 33.5 30.0 - 36.0 g/dL   RDW 19.1 47.8 - 29.5 %   Platelets 149 (L) 150 - 400 K/uL   nRBC 0.0 0.0 - 0.2 %    Comment: Performed at [facility], [address], [place], [place] [address]  Heparin level (unfractionated)     Status: None   Collection Time: 09/17/22 12:10 AM  Result Value Ref Range   Heparin Unfractionated 0.34 0.30 - 0.70 IU/mL    Comment: (NOTE) The clinical reportable range upper limit is being lowered to >1.10 to align with the FDA approved guidance for the current laboratory assay.  If heparin results are below expected values, and patient dosage has  been confirmed, suggest follow up testing of antithrombin III levels. Performed at [facility], [address], [place], [place] [address]   Heparin level (unfractionated)     Status: None  Collection Time: 09/17/22  7:31 AM  Result Value Ref Range   Heparin Unfractionated 0.38 0.30 - 0.70 IU/mL    Comment: (NOTE) The clinical reportable range upper limit is being lowered to >1.10 to align with the FDA approved guidance for the current laboratory assay.  If heparin results are below expected values, and patient dosage has  been confirmed, suggest follow up testing of antithrombin III levels. Performed at [facility], [address], [place], [place] [address]   Glucose, capillary     Status: Abnormal   Collection Time: 09/17/22  7:36 AM  Result Value Ref Range   Glucose-Capillary 242 (H) 70 - 99 mg/dL    Comment: Glucose reference range applies only to  samples taken after fasting for at least 8 hours.  Glucose, capillary     Status: Abnormal   Collection Time: 09/17/22 11:18 AM  Result Value Ref Range   Glucose-Capillary 259 (H) 70 - 99 mg/dL    Comment: Glucose reference range applies only to samples taken after fasting for at least 8 hours.  Glucose, capillary     Status: Abnormal   Collection Time: 09/17/22  4:26 PM  Result Value Ref Range   Glucose-Capillary 179 (H) 70 - 99 mg/dL    Comment: Glucose reference range applies only to samples taken after fasting for at least 8 hours.  Glucose, capillary     Status: Abnormal   Collection Time: 09/17/22  9:42 PM  Result Value Ref Range   Glucose-Capillary 191 (H) 70 - 99 mg/dL    Comment: Glucose reference range applies only to samples taken after fasting for at least 8 hours.  CBC     Status: Abnormal   Collection Time: 09/18/22  5:51 AM  Result Value Ref Range   WBC 4.6 4.0 - 10.5 K/uL   RBC 4.07 (L) 4.22 - 5.81 MIL/uL   Hemoglobin 14.1 13.0 - 17.0 g/dL   HCT 60.4 54.0 - 98.1 %   MCV 103.4 (H) 80.0 - 100.0 fL   MCH 34.6 (H) 26.0 - 34.0 pg   MCHC 33.5 30.0 - 36.0 g/dL   RDW 19.1 47.8 - 29.5 %   Platelets 148 (L) 150 - 400 K/uL   nRBC 0.0 0.0 - 0.2 %    Comment: Performed at [facility], [address], [place], [place] [address]  Heparin level (unfractionated)     Status: Abnormal   Collection Time: 09/18/22  5:51 AM  Result Value Ref Range   Heparin Unfractionated 0.27 (L) 0.30 - 0.70 IU/mL    Comment: (NOTE) The clinical reportable range upper limit is being lowered to >1.10 to align with the FDA approved guidance for the current laboratory assay.  If heparin results are below expected values, and patient dosage has  been confirmed, suggest follow up testing of antithrombin III levels. Performed at [facility], [address] [place], [place] [address]   Glucose, capillary     Status: Abnormal   Collection Time: 09/18/22  7:27  AM  Result Value Ref Range   Glucose-Capillary 216 (H) 70 - 99 mg/dL    Comment: Glucose reference range applies only to samples taken after fasting for at least 8 hours.    Studies/Results: No results found.  Medications: I have reviewed the patient's current medications.  Assessment: Dilated CBD and suspected mass on MRCP with elevated LFTs  EUS showed CBD 10 mm with an irregular 20 mm x 20 mm poorly defined  mass in pancreas, status post FNA suspicious for focal inflammation and not convincing for malignancy based on EUS findings  LFTs nearly normalized, T. bili 1.8, AST 48, ALT 220, ALP 273 on 09/16/2022  Significant alcohol use, history of recurrent alcohol induced pancreatitis Macrocytosis, MCV 103.4 Mild thrombocytopenia, platelet 148  Ultrasound showed distended gallbladder with stones and sludge MRCP showed 2.7 cm ill-defined hypervascular lesion in pancreatic head suspicious for pancreatic cancer versus focal pancreatitis along with mild diffuse gallbladder wall thickening  Cytology from FNA from pancreatic lesion pending Patient is being followed by surgery for evaluation for cholecystectomy CA 19-9 elevated 133   Plan: Hopefully cytology will be back today Continue supportive management with thiamine, multivitamin, folic acid Able to tolerate regular diet  Kerin Salen, MD 09/18/2022, 11:01 AM

## 2022-09-18 NOTE — Progress Notes (Signed)
ANTICOAGULATION CONSULT NOTE   Pharmacy Consult for heparin  Indication: splenic vein thrombosis  No Known Allergies  Patient Measurements: Height: 5\' 10"  (177.8 cm) Weight: 81 kg (178 lb 9.2 oz) IBW/kg (Calculated) : 73 Heparin Dosing Weight: TBW  Vital Signs: Temp: 97.9 F (36.6 C) (07/29 1324) Temp Source: Oral (07/29 1324) BP: 118/70 (07/29 1324) Pulse Rate: 62 (07/29 1324)  Labs: Recent Labs    09/16/22 0819 09/16/22 1605 09/17/22 0010 09/17/22 0731 09/18/22 0551 09/18/22 1444  HGB 14.5  --  13.4  --  14.1  --   HCT 43.4  --  40.0  --  42.1  --   PLT 153  --  149*  --  148*  --   APTT 44*  --   --   --   --   --   HEPARINUNFRC 0.20*   < > 0.34 0.38 0.27* 0.40  CREATININE 0.69  --   --   --   --   --    < > = values in this interval not displayed.    Estimated Creatinine Clearance: 125.5 mL/min (by C-G formula based on SCr of 0.69 mg/dL).  Medications:   heparin 2,050 Units/hr (09/18/22 0748)     Assessment: Patient is a 42 y.o M with hx EtOH who presented to the ED on 09/12/22 with high blood pressure and for workup for COVID. COVID test came back negative, but he was found to have elevated LFTs. 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 S/p EUS on 09/15/22 with aspiration of pancreatic head. Pharmacy has been consulted s/p procedure to start heparin drip for splenic vein thrombosis.  Today, 09/18/2022:  14:44 Heparin level 0.40, therapeutic on heparin at 2050 units/hr CBC: Hgb remains WNL, Plt consistent with baseline No bleeding or complications reported by RN  Remains on heparin pending results of biopsy   Goal of Therapy:  Heparin level 0.3-0.7 units/ml Monitor platelets by anticoagulation protocol: Yes   Plan:  -Continue heparin infusion at 2050 units/hr -Recheck confirmatory HL in 6 hrs -Daily heparin level and CBC   Lynden Ang, PharmD, BCPS Clinical Pharmacist 09/18/2022 3:24 PM

## 2022-09-18 NOTE — Progress Notes (Addendum)
3 Days Post-Op   Subjective/Chief Complaint: No complaints, no n/v. No abdominal pain. Awaiting return of pathology results   Objective: Vital signs in last 24 hours: Temp:  [97.5 F (36.4 C)-98.1 F (36.7 C)] 97.5 F (36.4 C) (07/29 0520) Pulse Rate:  [50-65] 50 (07/29 0520) Resp:  [14-18] 14 (07/29 0520) BP: (108-123)/(73-84) 123/84 (07/29 0520) SpO2:  [95 %-99 %] 98 % (07/29 0520) Last BM Date : 09/17/22  Intake/Output from previous day: 07/28 0701 - 07/29 0700 In: 1214 [P.O.:720; I.V.:494] Out: 1 [Stool:1] Intake/Output this shift: No intake/output data recorded.  General appearance: alert and cooperative Resp: clear to auscultation bilaterally Cardio: regular rate and rhythm GI: soft, nontender  Lab Results:  Recent Labs    09/17/22 0010 09/18/22 0551  WBC 6.6 4.6  HGB 13.4 14.1  HCT 40.0 42.1  PLT 149* 148*   BMET Recent Labs    09/16/22 0819  NA 133*  K 4.0  CL 98  CO2 23  GLUCOSE 311*  BUN 11  CREATININE 0.69  CALCIUM 9.0   PT/INR No results for input(s): "LABPROT", "INR" in the last 72 hours. ABG No results for input(s): "PHART", "HCO3" in the last 72 hours.  Invalid input(s): "PCO2", "PO2"  Studies/Results: No results found.  Anti-infectives: Anti-infectives (From admission, onward)    Start     Dose/Rate Route Frequency Ordered Stop   09/15/22 1215  ciprofloxacin (CIPRO) IVPB 400 mg  Status:  Discontinued        400 mg 200 mL/hr over 60 Minutes Intravenous  Once 09/15/22 1214 09/15/22 1431       Assessment/Plan: s/p Procedure(s): UPPER ENDOSCOPIC ULTRASOUND (EUS) LINEAR (Left) FINE NEEDLE ASPIRATION (FNA) LINEAR (N/A)  Diet as tolerated - carb modified DM control Med/onc following - Dr. Myna Hidalgo On heparin gtt Await path for pancreatic mass - will need to follow-up with hepatobiliary surgery as outpatient, Drs. Donell Beers and/or Freida Busman. Given findings on MRCP of a hypoechoic 3 cm mass in pancreatic head with portal venous  obstruction and splenic vein obstruction as well as elevated CA 19-9, clinical suspicion is highest for a pancreatic malignancy as opposed to findings 2/2 pancreatitis alone.  I spent a total of 35 minutes in both face-to-face and non-face-to-face activities, excluding procedures performed, for this visit on the date of this encounter.   LOS: 5 days    Stephanie Coup [facility] 09/18/2022

## 2022-09-19 ENCOUNTER — Other Ambulatory Visit (HOSPITAL_COMMUNITY): Payer: Self-pay

## 2022-09-19 ENCOUNTER — Inpatient Hospital Stay (HOSPITAL_COMMUNITY): Payer: Self-pay

## 2022-09-19 LAB — GLUCOSE, CAPILLARY
Glucose-Capillary: 164 mg/dL — ABNORMAL HIGH (ref 70–99)
Glucose-Capillary: 195 mg/dL — ABNORMAL HIGH (ref 70–99)
Glucose-Capillary: 127 mg/dL — ABNORMAL HIGH (ref 70–99)
Glucose-Capillary: 145 mg/dL — ABNORMAL HIGH (ref 70–99)

## 2022-09-19 MED ORDER — IOHEXOL 300 MG/ML  SOLN
100.0000 mL | Freq: Once | INTRAMUSCULAR | Status: AC | PRN
  Administered 2022-09-19: 100 mL via INTRAVENOUS

## 2022-09-19 MED ORDER — APIXABAN 5 MG PO TABS: 5.0000 mg | ORAL_TABLET | Freq: Two times a day (BID) | ORAL | Status: DC

## 2022-09-19 MED ORDER — SODIUM CHLORIDE (PF) 0.9 % IJ SOLN
INTRAMUSCULAR | Status: AC
  Filled 2022-09-19: qty 50

## 2022-09-19 MED ORDER — APIXABAN 5 MG PO TABS
10.0000 mg | ORAL_TABLET | Freq: Two times a day (BID) | ORAL | Status: DC
  Administered 2022-09-19 – 2022-09-21 (×5): 10 mg via ORAL
  Filled 2022-09-19 (×5): qty 2

## 2022-09-19 NOTE — Progress Notes (Signed)
[facility] PROGRESS NOTE    Progress Note  CHRISTIN JONES  NWG:[id number] DOB: Nov 15, 1980 DOA: 09/12/2022 PCP: Pcp, No     Brief Narrative:   LEEVON MADAY is an 42 y.o. male past medical history significant for alcohol abuse, essential hypertension, diabetes mellitus with prior DKA's admitted for hyperglycemia and uncontrolled hypertension and abnormal liver function test.  Of note patient has lost health insurance. Right upper quadrant ultrasound distended gallbladder with stones and sludge, biliary tree was dilated, common bile duct measures 12 mm, no wall thickening. MRCP showed 2  centimeter hypervascular lesion in the head of the pancreas obstructing the portal vein confluence with splenic thrombosis and focal segmental pancreatitis.   Assessment/Plan:   Alcoholic pancreatitis: ERCP done on 09/15/2022 that showed dilated common bile duct few lymph nodes biopsies were taken.   Abdominal ultrasound showed sludge with multiple stones. Awaiting general surgery further recommendations. Biopsy was negative for malignancy  Splenic vein thrombosis: Continue IV heparin. Oncology recommended to change to an oral DOAC, but will await general surgery further recommendations for lap chole as needed.  Alcohol abuse: With a recent binge drink on 09/11/2022. Continue thiamine and folate.  Uncontrolled type 2 diabetes mellitus with hyperglycemia, without long-term current use of insulin (HCC) A1c of 8.9, on admission he was hyperglycemic. Continue long-acting insulin per sliding scale blood glucose significantly improved.  Hypovolemic hyponatremia: KVO IV fluids.  Essential hypertension: Pressure is much improved today. Continue amlodipine and clonidine. Hydralazine IV as needed.    DVT prophylaxis: lovenox Family Communication:none Status is: Observation The patient will require care spanning > 2 midnights and should be moved to inpatient because: Acute abdominal  pain likely due to choledocholithiasis    Code Status:     Code Status Orders  (From admission, onward)           Start     Ordered   09/12/22 1755  Full code  Continuous       Question:  By:  Answer:  Consent: discussion documented in EHR   09/12/22 1756           Code Status History     Date Active Date Inactive Code Status Order ID Comments User Context   10/29/2020 2326 10/31/2020 1746 Full Code [id number]  Rometta Emery, MD ED   10/29/2020 1738 10/29/2020 2326 Full Code [id number]  Placido Sou, PA-C ED   04/04/2020 1154 04/15/2020 1706 Full Code [id number]  Jae Dire, MD ED   01/21/2018 2106 01/23/2018 2101 Full Code [id number]  Teryl Lucy, MD ED   05/20/2017 2023 05/21/2017 1840 Full Code [id number]  Elder Love, MD ED   05/20/2017 1804 05/20/2017 2022 Full Code [id number]  Blane Ohara, MD ED   02/15/2017 0222 02/16/2017 2329 Full Code [id number]  Delano Metz, MD ED   02/14/2017 1413 02/15/2017 0222 Full Code [id number]  Georgiana Shore, PA-C ED   12/02/2016 2021 12/07/2016 1724 Full Code [id number]  Clydie Braun, MD ED         IV Access:   Peripheral IV   Procedures and diagnostic studies:   No results found.   Medical Consultants:   None.   Subjective:    Fausto Skillern tolerating his diet no complaints.  Objective:    Vitals:   09/18/22 1324 09/18/22 1803 09/18/22 2112 09/19/22 0506  BP: 118/70 120/66 107/69 110/71  Pulse: 62 60 (!) 57 (!) 58  Resp: 16  20 14   Temp: 97.9 F (36.6  C)  98 F (36.7 C) 97.6 F (36.4 C)  TempSrc: Oral  Oral Oral  SpO2: 98%  99% 97%  Weight:      Height:       SpO2: 97 %   Intake/Output Summary (Last 24 hours) at 09/19/2022 0854 Last data filed at 09/18/2022 1900 Gross per 24 hour  Intake 939.68 ml  Output --  Net 939.68 ml   Filed Weights   09/12/22 1320 09/12/22 1841 09/15/22 1201  Weight: 83.9 kg 81 kg 81 kg    Exam: General exam: In no acute distress. Respiratory  system: Good air movement and clear to auscultation. Cardiovascular system: S1 & S2 heard, RRR. No JVD. Gastrointestinal system: Abdomen is nondistended, soft and nontender.  Extremities: No pedal edema. Skin: No rashes, lesions or ulcers Psychiatry: Judgement and insight appear normal. Mood & affect appropriate. Data Reviewed:    Labs: Basic Metabolic Panel: Recent Labs  Lab 09/13/22 0302 09/14/22 0518 09/15/22 0521 09/16/22 0819 09/19/22 0548  NA 130* 130* 132* 133* 135  K 3.6 4.4 4.0 4.0 3.9  CL 94* 95* 96* 98 99  CO2 25 24 26 23 28   GLUCOSE 269* 215* 269* 311* 179*  BUN 7 10 8 11 11   CREATININE 0.73 0.79 1.00 0.69 0.83  CALCIUM 8.8* 8.6* 9.0 9.0 9.0   GFR Estimated Creatinine Clearance: 120.9 mL/min (by C-G formula based on SCr of 0.83 mg/dL). Liver Function Tests: Recent Labs  Lab 09/13/22 0302 09/14/22 0518 09/15/22 0521 09/16/22 0819 09/19/22 0548  AST 491* 458* 205* 48* 56*  ALT 440* 396* 343* 220* 113*  ALKPHOS 299* 278* 314* 273* 161*  BILITOT 7.2* 5.4* 2.2* 1.8* 1.0  PROT 7.0 6.3* 6.9 6.9 6.8  ALBUMIN 3.4* 3.0* 3.1* 3.2* 3.2*   Recent Labs  Lab 09/13/22 0301  LIPASE 103*   No results for input(s): "AMMONIA" in the last 168 hours. Coagulation profile Recent Labs  Lab 09/12/22 1529 09/13/22 0302  INR 1.0 1.1   COVID-19 Labs  No results for input(s): "DDIMER", "FERRITIN", "LDH", "CRP" in the last 72 hours.   Lab Results  Component Value Date   SARSCOV2NAA NEGATIVE 09/12/2022   SARSCOV2NAA NEGATIVE 10/30/2020   SARSCOV2NAA NEGATIVE 04/04/2020    CBC: Recent Labs  Lab 09/12/22 1341 09/13/22 0302 09/16/22 0819 09/17/22 0010 09/18/22 0551 09/19/22 0548  WBC 9.1 6.7 9.0 6.6 4.6 5.0  NEUTROABS 7.6  --   --   --   --  2.6  HGB 16.8 15.1 14.5 13.4 14.1 13.8  HCT 45.9 42.4 43.4 40.0 42.1 41.3  MCV 94.6 98.1 102.6* 101.8* 103.4* 103.3*  PLT 165 144* 153 149* 148* 155   Cardiac Enzymes: No results for input(s): "CKTOTAL", "CKMB",  "CKMBINDEX", "TROPONINI" in the last 168 hours. BNP (last 3 results) No results for input(s): "PROBNP" in the last 8760 hours. CBG: Recent Labs  Lab 09/18/22 0727 09/18/22 1252 09/18/22 1615 09/18/22 2114 09/19/22 0731  GLUCAP 216* 153* 137* 88 164*   D-Dimer: No results for input(s): "DDIMER" in the last 72 hours.  Hgb A1c: No results for input(s): "HGBA1C" in the last 72 hours.  Lipid Profile: No results for input(s): "CHOL", "HDL", "LDLCALC", "TRIG", "CHOLHDL", "LDLDIRECT" in the last 72 hours. Thyroid function studies: No results for input(s): "TSH", "T4TOTAL", "T3FREE", "THYROIDAB" in the last 72 hours.  Invalid input(s): "FREET3" Anemia work up: No results for input(s): "VITAMINB12", "FOLATE", "FERRITIN", "TIBC", "IRON", "RETICCTPCT" in the last 72 hours. Sepsis Labs: Recent Labs  Lab 09/12/22  1529 09/13/22 0302 09/16/22 0819 09/17/22 0010 09/18/22 0551 09/19/22 0548  WBC  --    < > 9.0 6.6 4.6 5.0  LATICACIDVEN 1.5  --   --   --   --   --    < > = values in this interval not displayed.   Microbiology Recent Results (from the past 240 hour(s))  Resp panel by RT-PCR (RSV, Flu A&B, Covid) Anterior Nasal Swab     Status: None   Collection Time: 09/12/22  1:24 PM   Specimen: Anterior Nasal Swab  Result Value Ref Range Status   SARS Coronavirus 2 by RT PCR NEGATIVE NEGATIVE Final    Comment: (NOTE) SARS-CoV-2 target nucleic acids are NOT DETECTED.  The SARS-CoV-2 RNA is generally detectable in upper respiratory specimens during the acute phase of infection. The lowest concentration of SARS-CoV-2 viral copies this assay can detect is 138 copies/mL. A negative result does not preclude SARS-Cov-2 infection and should not be used as the sole basis for treatment or other patient management decisions. A negative result may occur with  improper specimen collection/handling, submission of specimen other than nasopharyngeal swab, presence of viral mutation(s) within  the areas targeted by this assay, and inadequate number of viral copies(<138 copies/mL). A negative result must be combined with clinical observations, patient history, and epidemiological information. The expected result is Negative.  Fact Sheet for Patients:  BloggerCourse.com  Fact Sheet for Healthcare Providers:  SeriousBroker.it  This test is no t yet approved or cleared by the [place] FDA and  has been authorized for detection and/or diagnosis of SARS-CoV-2 by FDA under an Emergency Use Authorization (EUA). This EUA will remain  in effect (meaning this test can be used) for the duration of the COVID-19 declaration under Section 564(b)(1) of the Act, 21 U.S.C.section 360bbb-3(b)(1), unless the authorization is terminated  or revoked sooner.       Influenza A by PCR NEGATIVE NEGATIVE Final   Influenza B by PCR NEGATIVE NEGATIVE Final    Comment: (NOTE) The Xpert Xpress SARS-CoV-2/FLU/RSV plus assay is intended as an aid in the diagnosis of influenza from Nasopharyngeal swab specimens and should not be used as a sole basis for treatment. Nasal washings and aspirates are unacceptable for Xpert Xpress SARS-CoV-2/FLU/RSV testing.  Fact Sheet for Patients: BloggerCourse.com  Fact Sheet for Healthcare Providers: SeriousBroker.it  This test is not yet approved or cleared by the [place] FDA and has been authorized for detection and/or diagnosis of SARS-CoV-2 by FDA under an Emergency Use Authorization (EUA). This EUA will remain in effect (meaning this test can be used) for the duration of the COVID-19 declaration under Section 564(b)(1) of the Act, 21 U.S.C. section 360bbb-3(b)(1), unless the authorization is terminated or revoked.     Resp Syncytial Virus by PCR NEGATIVE NEGATIVE Final    Comment: (NOTE) Fact Sheet for  Patients: BloggerCourse.com  Fact Sheet for Healthcare Providers: SeriousBroker.it  This test is not yet approved or cleared by the [place] FDA and has been authorized for detection and/or diagnosis of SARS-CoV-2 by FDA under an Emergency Use Authorization (EUA). This EUA will remain in effect (meaning this test can be used) for the duration of the COVID-19 declaration under Section 564(b)(1) of the Act, 21 U.S.C. section 360bbb-3(b)(1), unless the authorization is terminated or revoked.  Performed at [facility], [address] [place], [place] [address]   MRSA Next Gen by PCR, Nasal     Status: None   Collection  Time: 09/12/22  6:51 PM   Specimen: Nasal Mucosa; Nasal Swab  Result Value Ref Range Status   MRSA by PCR Next Gen NOT DETECTED NOT DETECTED Final    Comment: (NOTE) The GeneXpert MRSA Assay (FDA approved for NASAL specimens only), is one component of a comprehensive MRSA colonization surveillance program. It is not intended to diagnose MRSA infection nor to guide or monitor treatment for MRSA infections. Test performance is not FDA approved in patients less than 52 years old. Performed at [facility], [address], [place], [place] [address]      Medications:    amLODipine  10 mg Oral Daily   apixaban  10 mg Oral BID   Followed by   Melene Muller ON 09/26/2022] apixaban  5 mg Oral BID   cloNIDine  0.2 mg Oral BID   folic acid  1 mg Oral Daily   insulin aspart  0-15 Units Subcutaneous TID WC   insulin aspart  0-5 Units Subcutaneous QHS   insulin aspart  8 Units Subcutaneous TID WC   insulin detemir  30 Units Subcutaneous BID   multivitamin with minerals  1 tablet Oral Daily   thiamine  100 mg Oral Daily   Or   thiamine  100 mg Intravenous Daily   Continuous Infusions:      LOS: 6 days   Marinda Elk  [facility]  09/19/2022, 8:54 AM

## 2022-09-19 NOTE — Progress Notes (Signed)
Subjective: Patient reports complete resolution of abdominal pain.  He has been able to tolerate regular diet.  He is having regular bowel movements.  Objective: Vital signs in last 24 hours: Temp:  [97.6 F (36.4 C)-98 F (36.7 C)] 97.6 F (36.4 C) (07/30 0506) Pulse Rate:  [57-62] 58 (07/30 0506) Resp:  [14-20] 14 (07/30 0506) BP: (107-120)/(66-71) 110/71 (07/30 0506) SpO2:  [97 %-99 %] 97 % (07/30 0506) Weight change:  Last BM Date : 09/18/22  PE: Nonicteric, no pallor GENERAL: Not in distress  ABDOMEN: Soft, nontender, normoactive bowel sounds EXTREMITIES: No deformity  Lab Results: Results for orders placed or performed during the hospital encounter of 09/12/22 (from the past 48 hour(s))  Glucose, capillary     Status: Abnormal   Collection Time: 09/17/22  4:26 PM  Result Value Ref Range   Glucose-Capillary 179 (H) 70 - 99 mg/dL    Comment: Glucose reference range applies only to samples taken after fasting for at least 8 hours.  Glucose, capillary     Status: Abnormal   Collection Time: 09/17/22  9:42 PM  Result Value Ref Range   Glucose-Capillary 191 (H) 70 - 99 mg/dL    Comment: Glucose reference range applies only to samples taken after fasting for at least 8 hours.  CBC     Status: Abnormal   Collection Time: 09/18/22  5:51 AM  Result Value Ref Range   WBC 4.6 4.0 - 10.5 K/uL   RBC 4.07 (L) 4.22 - 5.81 MIL/uL   Hemoglobin 14.1 13.0 - 17.0 g/dL   HCT 18.8 41.6 - 60.6 %   MCV 103.4 (H) 80.0 - 100.0 fL   MCH 34.6 (H) 26.0 - 34.0 pg   MCHC 33.5 30.0 - 36.0 g/dL   RDW 30.1 60.1 - 09.3 %   Platelets 148 (L) 150 - 400 K/uL   nRBC 0.0 0.0 - 0.2 %    Comment: Performed at [facility], [address], [place], [place] [address]  Heparin level (unfractionated)     Status: Abnormal   Collection Time: 09/18/22  5:51 AM  Result Value Ref Range   Heparin Unfractionated 0.27 (L) 0.30 - 0.70 IU/mL    Comment: (NOTE) The clinical reportable range upper  limit is being lowered to >1.10 to align with the FDA approved guidance for the current laboratory assay.  If heparin results are below expected values, and patient dosage has  been confirmed, suggest follow up testing of antithrombin III levels. Performed at [facility], [address], [place], [place] [address]   Glucose, capillary     Status: Abnormal   Collection Time: 09/18/22  7:27 AM  Result Value Ref Range   Glucose-Capillary 216 (H) 70 - 99 mg/dL    Comment: Glucose reference range applies only to samples taken after fasting for at least 8 hours.  Glucose, capillary     Status: Abnormal   Collection Time: 09/18/22 12:52 PM  Result Value Ref Range   Glucose-Capillary 153 (H) 70 - 99 mg/dL    Comment: Glucose reference range applies only to samples taken after fasting for at least 8 hours.  Heparin level (unfractionated)     Status: None   Collection Time: 09/18/22  2:44 PM  Result Value Ref Range   Heparin Unfractionated 0.40 0.30 - 0.70 IU/mL    Comment: (NOTE) The clinical reportable range upper limit is being lowered to >1.10 to align with the FDA approved guidance for the current laboratory  assay.  If heparin results are below expected values, and patient dosage has  been confirmed, suggest follow up testing of antithrombin III levels. Performed at [facility], [address] [place], [place] [address]   Glucose, capillary     Status: Abnormal   Collection Time: 09/18/22  4:15 PM  Result Value Ref Range   Glucose-Capillary 137 (H) 70 - 99 mg/dL    Comment: Glucose reference range applies only to samples taken after fasting for at least 8 hours.  Heparin level (unfractionated)     Status: None   Collection Time: 09/18/22  8:54 PM  Result Value Ref Range   Heparin Unfractionated 0.33 0.30 - 0.70 IU/mL    Comment: (NOTE) The clinical reportable range upper limit is being lowered to >1.10 to align with the FDA approved  guidance for the current laboratory assay.  If heparin results are below expected values, and patient dosage has  been confirmed, suggest follow up testing of antithrombin III levels. Performed at [facility], [address] [place], [place] [address]   Glucose, capillary     Status: None   Collection Time: 09/18/22  9:14 PM  Result Value Ref Range   Glucose-Capillary 88 70 - 99 mg/dL    Comment: Glucose reference range applies only to samples taken after fasting for at least 8 hours.  Heparin level (unfractionated)     Status: Abnormal   Collection Time: 09/19/22  5:48 AM  Result Value Ref Range   Heparin Unfractionated 0.25 (L) 0.30 - 0.70 IU/mL    Comment: (NOTE) The clinical reportable range upper limit is being lowered to >1.10 to align with the FDA approved guidance for the current laboratory assay.  If heparin results are below expected values, and patient dosage has  been confirmed, suggest follow up testing of antithrombin III levels. Performed at [facility], [address], [place], [place] [address]   Comprehensive metabolic panel     Status: Abnormal   Collection Time: 09/19/22  5:48 AM  Result Value Ref Range   Sodium 135 135 - 145 mmol/L   Potassium 3.9 3.5 - 5.1 mmol/L   Chloride 99 98 - 111 mmol/L   CO2 28 22 - 32 mmol/L   Glucose, Bld 179 (H) 70 - 99 mg/dL    Comment: Glucose reference range applies only to samples taken after fasting for at least 8 hours.   BUN 11 6 - 20 mg/dL   Creatinine, Ser 4.74 0.61 - 1.24 mg/dL   Calcium 9.0 8.9 - 25.9 mg/dL   Total Protein 6.8 6.5 - 8.1 g/dL   Albumin 3.2 (L) 3.5 - 5.0 g/dL   AST 56 (H) 15 - 41 U/L   ALT 113 (H) 0 - 44 U/L   Alkaline Phosphatase 161 (H) 38 - 126 U/L   Total Bilirubin 1.0 0.3 - 1.2 mg/dL   GFR, Estimated >56 >38 mL/min    Comment: (NOTE) Calculated using the CKD-EPI Creatinine Equation (2021)    Anion gap 8 5 - 15    Comment: Performed at [facility], [address], [place], [place] [address]  CBC with Differential/Platelet     Status: Abnormal   Collection Time: 09/19/22  5:48 AM  Result Value Ref Range   WBC 5.0 4.0 - 10.5 K/uL   RBC 4.00 (L) 4.22 - 5.81 MIL/uL   Hemoglobin 13.8 13.0 - 17.0 g/dL   HCT 32.9 51.8 - 84.1 %  MCV 103.3 (H) 80.0 - 100.0 fL   MCH 34.5 (H) 26.0 - 34.0 pg   MCHC 33.4 30.0 - 36.0 g/dL   RDW 32.3 55.7 - 32.2 %   Platelets 155 150 - 400 K/uL   nRBC 0.0 0.0 - 0.2 %   Neutrophils Relative % 51 %   Neutro Abs 2.6 1.7 - 7.7 K/uL   Lymphocytes Relative 36 %   Lymphs Abs 1.8 0.7 - 4.0 K/uL   Monocytes Relative 9 %   Monocytes Absolute 0.5 0.1 - 1.0 K/uL   Eosinophils Relative 2 %   Eosinophils Absolute 0.1 0.0 - 0.5 K/uL   Basophils Relative 1 %   Basophils Absolute 0.0 0.0 - 0.1 K/uL   Immature Granulocytes 1 %   Abs Immature Granulocytes 0.07 0.00 - 0.07 K/uL    Comment: Performed at [facility], [address] [place], [place] [address]  Glucose, capillary     Status: Abnormal   Collection Time: 09/19/22  7:31 AM  Result Value Ref Range   Glucose-Capillary 164 (H) 70 - 99 mg/dL    Comment: Glucose reference range applies only to samples taken after fasting for at least 8 hours.    Studies/Results: No results found.  Medications: I have reviewed the patient's current medications.  Assessment: Pancreatic mass, status post EUS and FNA: No malignant cells identified LFTs have nearly normalized, T. bili 1, AST 56, ALT 113, ALP 161  Plan: Okay to DC home on low-fat diet from GI standpoint. Discussed about risk of chronic pancreatitis and complications if he continues to drinking alcohol. Surgical team is concerned about elevated CA 19-9, which needs to be followed up as an outpatient, repeat CA 19-9 recommended in 6 to 8 weeks. Patient will benefit from cholecystectomy. Patient to follow-up with Dr. Dulce Sellar as an outpatient, will likely need repeat imaging to  ensure complete resolution of inflammatory mass noted in pancreas.  Kerin Salen, MD 09/19/2022, 11:26 AM

## 2022-09-19 NOTE — Progress Notes (Signed)
ANTICOAGULATION CONSULT NOTE   Pharmacy Consult for heparin>>Eliquis  Indication: splenic vein thrombosis  No Known Allergies  Patient Measurements: Height: 5\' 10"  (177.8 cm) Weight: 81 kg (178 lb 9.2 oz) IBW/kg (Calculated) : 73 Heparin Dosing Weight: TBW  Vital Signs: Temp: 97.6 F (36.4 C) (07/30 0506) Temp Source: Oral (07/30 0506) BP: 110/71 (07/30 0506) Pulse Rate: 58 (07/30 0506)  Labs: Recent Labs    09/16/22 0819 09/16/22 1605 09/17/22 0010 09/17/22 0731 09/18/22 0551 09/18/22 1444 09/18/22 2054 09/19/22 0548  HGB 14.5  --  13.4  --  14.1  --   --  13.8  HCT 43.4  --  40.0  --  42.1  --   --  41.3  PLT 153  --  149*  --  148*  --   --  155  APTT 44*  --   --   --   --   --   --   --   HEPARINUNFRC 0.20*   < > 0.34   < > 0.27* 0.40 0.33 0.25*  CREATININE 0.69  --   --   --   --   --   --  0.83   < > = values in this interval not displayed.    Estimated Creatinine Clearance: 120.9 mL/min (by C-G formula based on SCr of 0.83 mg/dL).  Medications:   heparin 2,150 Units/hr (09/19/22 0981)     Assessment: Patient is a 42 y.o M with hx EtOH who presented to the ED on 09/12/22 with high blood pressure and for workup for COVID. COVID test came back negative, but he was found to have elevated LFTs. 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 S/p EUS on 09/15/22 with aspiration of pancreatic head. Pharmacy was consulted s/p procedure to start heparin drip for splenic vein thrombosis, now transitioning to Eliquis.  Today, 09/19/2022:  Heparin level today is subtherapeutic at 0.25, on 2050 units/hr. Hgb 13.8, plt 155--stable. No line issues or signs/symptoms of bleeding reported.  Goal of Therapy:  Heparin level 0.3-0.7 units/ml Monitor platelets by anticoagulation protocol: Yes   Plan:  - Stop heparin gtt - Start Eliquis 10mg  BID x7 days, followed by Eliquis 5mg  BID thereafter  - Monitor for s/sx of bleeding daily   Cherylin Mylar, PharmD Clinical  Pharmacist  7/30/20247:22 AM

## 2022-09-19 NOTE — Inpatient Diabetes Management (Addendum)
Inpatient Diabetes Program Recommendations  AACE/ADA: New Consensus Statement on Inpatient Glycemic Control (2015)  Target Ranges:  Prepandial:   less than 140 mg/dL      Peak postprandial:   less than 180 mg/dL (1-2 hours)      Critically ill patients:  140 - 180 mg/dL    Latest Reference Range & Units 09/18/22 07:27 09/18/22 12:52 09/18/22 16:15 09/18/22 21:14  Glucose-Capillary 70 - 99 mg/dL 161 (H) 096 (H) 045 (H) 88  (H): Data is abnormally high  Latest Reference Range & Units 09/19/22 07:31  Glucose-Capillary 70 - 99 mg/dL 409 (H)  (H): Data is abnormally high   Home DM Meds: Metformin 1000 mg BID (NOT taking)                             Glipizide 5 mg BID (NOT taking)   Current Orders: Novolog Moderate Correction Scale/ SSI (0-15 units) TID AC + HS                           Levemir 30 units BID                            Novolog 8 units TID with meals      CBGs much improved over the last 24 hours   MD- If pt will need insulin for home, please keep cost considerations in mind. Does not have health insurance coverage right now  Will need affordable regimen  If you decide to d/c pt on insulin, please consider Novolin Reli-On 70/30 Insulin from [facility] ($43 for pack of 5 pens)  Reli-On Novolin 70/30 Insulin: Order Number [id number] Insulin Pen Needles: Order Number [id number]  Could start with 70/30 Insulin 30 units BID with meals This would give pt about 42 units longer-acting insulin total This would give pt 9 units Novolog BID    Addendum 11am--Met w/ pt at bedside.  Discussed again with pt that he may need insulin for home when he discharges.  Discussed w/ pt that I have asked MD to keep cost considerations in mind given he does not have health insurance coverage (see above).  Educated patient on insulin pen use at home.  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  Reviewed all steps of insulin pen including attachment of  needle, 2-unit air shot, dialing up dose, giving injection, rotation of injection sites, removing needle, disposal of sharps, storage of unused insulin, disposal of insulin etc.  Patient able to provide successful return demonstration.  Reviewed troubleshooting with insulin pen.  Also reviewed Signs/Symptoms of Hypoglycemia with patient and how to treat Hypoglycemia at home.  Have asked RNs caring for patient to please allow patient to give all injections here in hospital as much as possible for practice.  MD to give patient Rxs for insulin pens and insulin pen needles.        --Will follow patient during hospitalization--  Ambrose Finland RN, MSN, CDCES Diabetes Coordinator Inpatient Glycemic Control Team Team Pager: [phone] (8a-5p)

## 2022-09-19 NOTE — Progress Notes (Signed)
ANTICOAGULATION CONSULT NOTE   Pharmacy Consult for heparin  Indication: splenic vein thrombosis  No Known Allergies  Patient Measurements: Height: 5\' 10"  (177.8 cm) Weight: 81 kg (178 lb 9.2 oz) IBW/kg (Calculated) : 73 Heparin Dosing Weight: TBW  Vital Signs: Temp: 97.6 F (36.4 C) (07/30 0506) Temp Source: Oral (07/30 0506) BP: 110/71 (07/30 0506) Pulse Rate: 58 (07/30 0506)  Labs: Recent Labs    09/16/22 0819 09/16/22 1605 09/17/22 0010 09/17/22 0731 09/18/22 0551 09/18/22 1444 09/18/22 2054 09/19/22 0548  HGB 14.5  --  13.4  --  14.1  --   --  13.8  HCT 43.4  --  40.0  --  42.1  --   --  41.3  PLT 153  --  149*  --  148*  --   --  155  APTT 44*  --   --   --   --   --   --   --   HEPARINUNFRC 0.20*   < > 0.34   < > 0.27* 0.40 0.33 0.25*  CREATININE 0.69  --   --   --   --   --   --  0.83   < > = values in this interval not displayed.    Estimated Creatinine Clearance: 120.9 mL/min (by C-G formula based on SCr of 0.83 mg/dL).  Medications:   heparin 2,050 Units/hr (09/19/22 0515)     Assessment: Patient is a 42 y.o M with hx EtOH who presented to the ED on 09/12/22 with high blood pressure and for workup for COVID. COVID test came back negative, but he was found to have elevated LFTs. 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 S/p EUS on 09/15/22 with aspiration of pancreatic head. Pharmacy has been consulted s/p procedure to start heparin drip for splenic vein thrombosis.  Today, 09/19/2022:  Heparin level 0.25, now sub-therapeutic on heparin at 2050 units/hr CBC: Hgb remains WNL, Plt consistent with baseline No bleeding reported by RN.  Heparin was paused ~30 min since last blood draw (exact time unknown) due to issues with IV line/beeping.  Remains on heparin pending results of biopsy   Goal of Therapy:  Heparin level 0.3-0.7 units/ml Monitor platelets by anticoagulation protocol: Yes   Plan:  - Increase IV heparin infusion to 2150  units/hr - Recheck heparin level in 6h - Daily heparin level and CBC -F/U bx results, long-term anticoagulation plans   Junita Push, PharmD, BCPS Clinical Pharmacist 09/19/2022 6:24 AM

## 2022-09-19 NOTE — Progress Notes (Signed)
Thankfully, the pathology report ([id number]) did not show any malignant cells.  As such, I think that this pancreatic head lesion is hopefully not cancerous.  I realizes that there can always be a sampling error.  We are going to have to watch this in the future.  I still do worry about the CA 19-9 still being a little bit elevated.  As such, we will now see about getting him over to an oral anticoagulant.  I think the problem now is going to be how we can do for this as an outpatient.  We will try him on Eliquis.  We will have to see if he can afford this.  He clearly is going to have to stop drinking.  I suspect this probably was a cause of his issues.  Hopefully, he will be able to abstain from alcohol.  He has been on heparin.  He has been doing okay on the heparin.  His labs show sodium 135.  Potassium 3.9.  BUN 11 creatinine 0.83.  Blood sugar 179.  His albumin is 3.2.  His bilirubin 1.0.  SGPT 113 SGOT 56.  His white cell count is 5.  Hemoglobin 13.8.  Platelet count 155,000.  His vital signs are temperature 97.6.  Pulse 58.  Blood pressure 110/71.  His abdominal exam is soft.  Bowel sounds are present.  There is no fluid wave.  There is no palpable liver or spleen tip.  Lungs are clear bilaterally.  Cardiac exam regular rate and rhythm.  Extremity shows no clubbing, cyanosis or edema.  At this point, hopefully this pancreatic head lesion is localized pancreatitis from alcohol ingestion.  We will have to follow this in the future.  We will have to repeat his CA 19-9 in about a month or so.  We will need case management to help figure out what anticoagulant he can afford.  Hopefully, he can go home today.  He will need to have his diabetes managed.  He really needs to have some type of outpatient follow-up for his diabetes, high blood pressure, and alcoholic liver disease.   Christin Bach, MD   Duwayne Heck 41:10

## 2022-09-20 DIAGNOSIS — K859 Acute pancreatitis without necrosis or infection, unspecified: Secondary | ICD-10-CM

## 2022-09-20 LAB — GLUCOSE, CAPILLARY
Glucose-Capillary: 135 mg/dL — ABNORMAL HIGH (ref 70–99)
Glucose-Capillary: 135 mg/dL — ABNORMAL HIGH (ref 70–99)
Glucose-Capillary: 203 mg/dL — ABNORMAL HIGH (ref 70–99)
Glucose-Capillary: 77 mg/dL (ref 70–99)

## 2022-09-20 NOTE — TOC Progression Note (Addendum)
Transition of Care [facility]) - Progression Note    Patient Details  Name: Peter [name] MRN: [id number] Date of Birth: 03-11-1980  Transition of Care [facility]) CM/SW Contact  Beckie Busing, RN Phone Number:[phone]  09/20/2022, 2:24 PM  Clinical Narrative:    Match form completed to provide patient with 30 day supply of meds. Follow up appointment has been scheduled for [facility].Info has been added to AVS.     Expected Discharge Plan: Home/Self Care Barriers to Discharge: Inadequate or no insurance, Architect (pcp needed)  Expected Discharge Plan and Services   Discharge Planning Services: MATCH Program   Living arrangements for the past 2 months: Apartment                                       Social Determinants of Health (SDOH) Interventions SDOH Screenings   Housing: Patient Declined (09/14/2022)  Depression (PHQ2-9): Low Risk  (05/27/2020)  Tobacco Use: High Risk (09/15/2022)    Readmission Risk Interventions     No data to display

## 2022-09-20 NOTE — Progress Notes (Signed)
PROGRESS NOTE    Peter [name]  ZOX:[id number] DOB: 12/21/1980 DOA: 09/12/2022 PCP: Oneita Hurt, No   Brief Narrative: Peter [name] is a 42 y.o. male with a history of abuse, hypertension, diabetes mellitus.  Patient presented secondary to running out of his hypertension medication having elevated blood pressures at home.  During hospitalization, workup was significant for evidence of possible cholecystitis in addition to pancreatitis. Further workup was concerning for possible in addition to thrombosis in addition of coagulation.  Patient to follow-up with general surgery and hematology/oncology as an outpatient.   Assessment and Plan:  Alcoholic pancreatitis Patient presented with a lipase of 103.  MRCP significant for mildly dilated pancreatic duct with hypervascular lesion of the pancreatic head with concern for pancreatic carcinoma versus possible focal pancreatitis.  General surgery was consulted for evidence of associated common bile duct dilation with gallstones significant for multiple tiny gallstones and diffuse gallbladder wall thickening but have deferred cholecystectomy at this time.  Gastroenterology performed ERCP on 7/26 which was significant for bile duct dilation with lymph nodes biopsied.  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  Patient started on heparin and transitioned to Eliquis for treatment. -Continue Eliquis  Alcohol abuse Noted.  Continue thiamine, folate, multivitamin.  Diabetes mellitus type 2 Hemoglobin A1c of 8.9%.  Uncontrolled with hyperglycemia.  Patient started on insulin this admission. -Continue Levemir 30 units twice daily in addition to NovoLog 8 units 3 times daily with meals and sliding scale insulin  Primary pretension -Continue amlodipine and clonidine   DVT prophylaxis: Eliquis Code Status:   Code Status: Full Code Family Communication: None at bedside Disposition  Plan: Discharge home tomorrow if we can secure outpatient medications for several months of coagulation, general surgery follow-up   Consultants:  General surgery Gastroenterology  Procedures:    Antimicrobials:     Subjective: Patient reports improvement of abdominal pain no other concerns overnight.  Objective: BP 94/63 (BP Location: Left Arm)   Pulse 88   Temp 98.1 F (36.7 C) (Oral)   Resp 18   Ht 5\' 10"  (1.778 m)   Wt 81 kg   SpO2 98%   BMI 25.62 kg/m   Examination:  General exam: Appears calm and comfortable  Respiratory system: Clear to auscultation. Respiratory effort normal. Cardiovascular system: S1 & S2 heard, RRR. Gastrointestinal system: Abdomen is nondistended, soft and nontender. Normal bowel sounds heard. Central nervous system: Alert and oriented. No focal neurological deficits. Musculoskeletal: No edema. No calf tenderness Skin: No cyanosis. No rashes Psychiatry: Judgement and insight appear normal. Mood & affect appropriate.    Data Reviewed: I have personally reviewed following labs and imaging studies  CBC Lab Results  Component Value Date   WBC 5.5 09/20/2022   RBC 4.21 (L) 09/20/2022   HGB 14.4 09/20/2022   HCT 43.2 09/20/2022   MCV 102.6 (H) 09/20/2022   MCH 34.2 (H) 09/20/2022   PLT 162 09/20/2022   MCHC 33.3 09/20/2022   RDW 12.3 09/20/2022   LYMPHSABS 1.4 09/20/2022   MONOABS 0.6 09/20/2022   EOSABS 0.1 09/20/2022   BASOSABS 0.1 09/20/2022     Last metabolic panel Lab Results  Component Value Date   NA 137 09/20/2022   K 3.9 09/20/2022   CL 100 09/20/2022   CO2 28 09/20/2022   BUN 11 09/20/2022   CREATININE 0.78 09/20/2022   GLUCOSE 150 (H) 09/20/2022   GFRNONAA >60 09/20/2022   GFRAA >60 01/21/2018  CALCIUM 9.1 09/20/2022   PHOS 3.9 10/31/2020   PROT 6.9 09/20/2022   ALBUMIN 3.3 (L) 09/20/2022   LABGLOB 3.0 06/01/2020   AGRATIO 1.4 06/01/2020   BILITOT 1.1 09/20/2022   ALKPHOS 147 (H) 09/20/2022   AST 37  09/20/2022   ALT 93 (H) 09/20/2022   ANIONGAP 9 09/20/2022    GFR: Estimated Creatinine Clearance: 125.5 mL/min (by C-G formula based on SCr of 0.78 mg/dL).  Recent Results (from the past 240 hour(s))  Resp panel by RT-PCR (RSV, Flu A&B, Covid) Anterior Nasal Swab     Status: None   Collection Time: 09/12/22  1:24 PM   Specimen: Anterior Nasal Swab  Result Value Ref Range Status   SARS Coronavirus 2 by RT PCR NEGATIVE NEGATIVE Final    Comment: (NOTE) SARS-CoV-2 target nucleic acids are NOT DETECTED.  The SARS-CoV-2 RNA is generally detectable in upper respiratory specimens during the acute phase of infection. The lowest concentration of SARS-CoV-2 viral copies this assay can detect is 138 copies/mL. A negative result does not preclude SARS-Cov-2 infection and should not be used as the sole basis for treatment or other patient management decisions. A negative result may occur with  improper specimen collection/handling, submission of specimen other than nasopharyngeal swab, presence of viral mutation(s) within the areas targeted by this assay, and inadequate number of viral copies(<138 copies/mL). A negative result must be combined with clinical observations, patient history, and epidemiological information. The expected result is Negative.  Fact Sheet for Patients:  BloggerCourse.com  Fact Sheet for Healthcare Providers:  SeriousBroker.it  This test is no t yet approved or cleared by the [place] FDA and  has been authorized for detection and/or diagnosis of SARS-CoV-2 by FDA under an Emergency Use Authorization (EUA). This EUA will remain  in effect (meaning this test can be used) for the duration of the COVID-19 declaration under Section 564(b)(1) of the Act, 21 U.S.C.section 360bbb-3(b)(1), unless the authorization is terminated  or revoked sooner.       Influenza A by PCR NEGATIVE NEGATIVE Final   Influenza B by  PCR NEGATIVE NEGATIVE Final    Comment: (NOTE) The Xpert Xpress SARS-CoV-2/FLU/RSV plus assay is intended as an aid in the diagnosis of influenza from Nasopharyngeal swab specimens and should not be used as a sole basis for treatment. Nasal washings and aspirates are unacceptable for Xpert Xpress SARS-CoV-2/FLU/RSV testing.  Fact Sheet for Patients: BloggerCourse.com  Fact Sheet for Healthcare Providers: SeriousBroker.it  This test is not yet approved or cleared by the [place] FDA and has been authorized for detection and/or diagnosis of SARS-CoV-2 by FDA under an Emergency Use Authorization (EUA). This EUA will remain in effect (meaning this test can be used) for the duration of the COVID-19 declaration under Section 564(b)(1) of the Act, 21 U.S.C. section 360bbb-3(b)(1), unless the authorization is terminated or revoked.     Resp Syncytial Virus by PCR NEGATIVE NEGATIVE Final    Comment: (NOTE) Fact Sheet for Patients: BloggerCourse.com  Fact Sheet for Healthcare Providers: SeriousBroker.it  This test is not yet approved or cleared by the [place] FDA and has been authorized for detection and/or diagnosis of SARS-CoV-2 by FDA under an Emergency Use Authorization (EUA). This EUA will remain in effect (meaning this test can be used) for the duration of the COVID-19 declaration under Section 564(b)(1) of the Act, 21 U.S.C. section 360bbb-3(b)(1), unless the authorization is terminated or revoked.  Performed at [facility], [address] [place],  [place] [address]   MRSA Next Gen by PCR, Nasal     Status: None   Collection Time: 09/12/22  6:51 PM   Specimen: Nasal Mucosa; Nasal Swab  Result Value Ref Range Status   MRSA by PCR Next Gen NOT DETECTED NOT DETECTED Final    Comment: (NOTE) The GeneXpert MRSA Assay (FDA approved for NASAL  specimens only), is one component of a comprehensive MRSA colonization surveillance program. It is not intended to diagnose MRSA infection nor to guide or monitor treatment for MRSA infections. Test performance is not FDA approved in patients less than 59 years old. Performed at [facility], [address] [place], [place] [address]       Radiology Studies: CT PANCREAS ABD W/WO  Result Date: 09/19/2022 CLINICAL DATA:  Abdominal mass, pancreatic neoplasm suspected * Tracking Code: BO * EXAM: CT ABDOMEN WITHOUT AND WITH CONTRAST TECHNIQUE: Multidetector CT imaging of the abdomen was performed following the standard protocol before and following the bolus administration of intravenous contrast. RADIATION DOSE REDUCTION: This exam was performed according to the departmental dose-optimization program which includes automated exposure control, adjustment of the mA and/or kV according to patient size and/or use of iterative reconstruction technique. CONTRAST:  OMNIPAQUE IOHEXOL 300 MG/ML  SOLN COMPARISON:  MR abdomen, 09/13/2022, MR abdomen, 04/12/2020, CT abdomen pelvis, 04/11/2020 FINDINGS: Lower chest: No acute abnormality. Hepatobiliary: No solid liver abnormality is seen. Small gallstones. Mild gallbladder wall thickening. Mild intra and extrahepatic biliary ductal dilatation, the common bile duct again appears obstructed within the pancreatic head and measures up to 0.9 cm in caliber (series 13, image 43). Pancreas: Isoenhancing, masslike fullness of the inferior pancreatic head and uncinate measuring approximately 3.4 x 3.0 cm (series 7, image 55). The pancreatic duct is effaced in the superior pancreatic head at the level of common bile duct effacement. No significant pancreatic ductal dilatation. Spleen: Mild splenomegaly, maximum coronal span 13.6 cm. Adrenals/Urinary Tract: Adrenal glands are unremarkable. Kidneys are normal, without renal calculi, solid lesion, or  hydronephrosis. Stomach/Bowel: Stomach is within normal limits. No evidence of bowel wall thickening, distention, or inflammatory changes. Vascular/Lymphatic: Aortic atherosclerosis. The portal confluence and superior mesenteric vein are somewhat narrowed, although patent on today's examination (series 7, image 48), however the splenic vein is severely narrowed and almost completely effaced along the length of the pancreatic body (series 7, image 50), with extensive variceal collateralization about the left upper quadrant and stomach, notably with extensive gastric fundal and gastroesophageal varices (series 7, image 26). No enlarged abdominal lymph nodes. Other: No abdominal wall hernia or abnormality. No ascites. Musculoskeletal: No acute or significant osseous findings. IMPRESSION: 1. Isoenhancing, masslike fullness of the inferior pancreatic head and uncinate measuring approximately 3.4 x 3.0 cm, in keeping with finding of prior MR, and generally multiple additional prior examinations. Findings primarily suggest chronic sequelae of pancreatitis, given numerous episodes of prior pancreatitis documented by imaging and evidence of prior necrotic and pseudocystic change in this vicinity, for example on MR examination dated February 2022. Pancreatic adenocarcinoma is however difficult to strictly exclude given imaging appearance and evidence of common bile duct and pancreatic ductal obstruction. Consider EUS/FNA for tissue exclusion of malignancy. 2. No acute inflammatory findings at this time. 3. Mild intra and extrahepatic biliary ductal dilatation, the common bile duct again appears obstructed within the pancreatic head and measures up to 0.9 cm in caliber. 4. The portal confluence and superior mesenteric vein are somewhat narrowed, although patent on today's examination, however the  splenic vein is severely narrowed and almost completely effaced along the length of the pancreatic body, with extensive variceal  collateralization about the left upper quadrant and stomach. Notably, there are extensive gastric fundal and gastroesophageal varices. 5. Mild splenomegaly. 6. Cholelithiasis. 7. No evidence of lymphadenopathy or metastatic disease in the abdomen. Aortic Atherosclerosis (ICD10-I70.0). Electronically Signed   By: Jearld Lesch M.D.   On: 09/19/2022 15:59      LOS: 7 days    Jacquelin Hawking, MD [facility] 09/20/2022, 6:18 PM   If 7PM-7AM, please contact night-coverage [email or web address]

## 2022-09-20 NOTE — Plan of Care (Signed)
  Problem: Pain Managment: Goal: General experience of comfort will improve Outcome: Progressing   Problem: Clinical Measurements: Goal: Will remain free from infection Outcome: Progressing   Problem: Clinical Measurements: Goal: Diagnostic test results will improve Outcome: Progressing   Problem: Metabolic: Goal: Ability to maintain appropriate glucose levels will improve Outcome: Progressing

## 2022-09-20 NOTE — Hospital Course (Signed)
Peter [name] is a 42 y.o. male with a history of abuse, hypertension, diabetes mellitus.  Patient presented secondary to running out of his hypertension medication having elevated blood pressures at home.  During hospitalization, workup was significant for evidence of possible cholecystitis in addition to pancreatitis. Further workup was concerning for possible in addition to thrombosis in addition of coagulation.  Patient to follow-up with general surgery and hematology/oncology as an outpatient.

## 2022-09-21 ENCOUNTER — Other Ambulatory Visit (HOSPITAL_COMMUNITY): Payer: Self-pay

## 2022-09-21 DIAGNOSIS — I82890 Acute embolism and thrombosis of other specified veins: Secondary | ICD-10-CM

## 2022-09-21 LAB — GLUCOSE, CAPILLARY
Glucose-Capillary: 146 mg/dL — ABNORMAL HIGH (ref 70–99)
Glucose-Capillary: 83 mg/dL (ref 70–99)

## 2022-09-21 MED ORDER — APIXABAN 5 MG PO TABS
ORAL_TABLET | ORAL | 0 refills | Status: DC
Start: 2022-09-21 — End: 2023-01-23
  Filled 2022-09-21: qty 186, 88d supply, fill #0
  Filled 2022-09-21: qty 74, 30d supply, fill #0
  Filled 2022-09-21: qty 186, 88d supply, fill #0
  Filled 2022-09-21: qty 70, 30d supply, fill #0
  Filled 2022-10-26: qty 60, 30d supply, fill #1

## 2022-09-21 MED ORDER — METFORMIN HCL 1000 MG PO TABS
1000.0000 mg | ORAL_TABLET | Freq: Two times a day (BID) | ORAL | 0 refills | Status: DC
  Filled 2022-09-21: qty 180, 90d supply, fill #0

## 2022-09-21 MED ORDER — CLONIDINE HCL 0.2 MG PO TABS
0.2000 mg | ORAL_TABLET | Freq: Two times a day (BID) | ORAL | 0 refills | Status: DC
  Filled 2022-09-21: qty 180, 90d supply, fill #0

## 2022-09-21 MED ORDER — FOLIC ACID 1 MG PO TABS
1.0000 mg | ORAL_TABLET | Freq: Every day | ORAL | 0 refills | Status: AC
  Filled 2022-09-21: qty 90, 90d supply, fill #0

## 2022-09-21 MED ORDER — SIMVASTATIN 20 MG PO TABS
20.0000 mg | ORAL_TABLET | Freq: Every day | ORAL | 0 refills | Status: DC
  Filled 2022-09-21: qty 90, 90d supply, fill #0

## 2022-09-21 MED ORDER — NOVOLIN 70/30 FLEXPEN RELION (70-30) 100 UNIT/ML ~~LOC~~ SUPN
30.0000 [IU] | PEN_INJECTOR | Freq: Two times a day (BID) | SUBCUTANEOUS | 2 refills | Status: DC
Start: 2022-09-21 — End: 2022-11-29
  Filled 2022-09-21: qty 15, 25d supply, fill #0

## 2022-09-21 MED ORDER — ADULT MULTIVITAMIN W/MINERALS CH
1.0000 | ORAL_TABLET | Freq: Every day | ORAL | 0 refills | Status: AC
  Filled 2022-09-21: qty 90, 90d supply, fill #0

## 2022-09-21 MED ORDER — "PEN NEEDLES 3/16"" 31G X 5 MM MISC"
0 refills | Status: DC
  Filled 2022-09-21: qty 100, 100d supply, fill #0

## 2022-09-21 MED ORDER — AMLODIPINE BESYLATE 10 MG PO TABS
10.0000 mg | ORAL_TABLET | Freq: Every day | ORAL | 0 refills | Status: DC
  Filled 2022-09-21: qty 90, 90d supply, fill #0

## 2022-09-21 NOTE — Progress Notes (Signed)
Hopefully, Peter [name] will be able to go home today.  He is on Eliquis.  I think that is been hard to try to get Eliquis for him as an outpatient.  He feels well.  He has had no bleeding.  There is been no abdominal pain.  He has had no nausea or vomiting.  He has had no difficulty with bowels or bladder.  His bilirubin is 1.0.  SGPT is 79 SGOT 32.  BUN is 12 creatinine 0.72.  White cell count is 6.9.  Hemoglobin 14.5.  Platelet count 191,000.  Vital signs are all stable.  Temperature 98.1.  Pulse 51.  Blood pressure 105/70.  His lungs are clear to percussion auscultation bilaterally.  Cardiac exam regular rate and rhythm.  Abdomen is soft.  Bowel sounds are present.  There is no guarding or rebound tenderness.  Neurological exam is nonfocal.   Again, Mr. Foltyn was not found to have malignancy on endoscopic ultrasound.  However, the CA 19-9 was on the high side at 133.  We will have to monitor this.  He has a splenic vein thrombus.  Hopefully, we will just need maybe 3 months of anticoagulation for this.  Hopefully, as this area of inflammation around the pancreatic head resolved, the splenic vein will "open up.".  If he goes home today, we will plan to see him back in the office in a couple of weeks.   Christin Bach, MD  1 Cor 9:24-25

## 2022-09-21 NOTE — TOC Progression Note (Signed)
Transition of Care [facility]) - Progression Note    Patient Details  Name: Peter [name] MRN: [id number] Date of Birth: [birth year]  [facility]) CM/SW Contact  Adrian Prows, RN Phone Number: 09/21/2022, 1:00 PM  Clinical Narrative:    Pt given list of agencies that accept [facility]; he verbalized understanding and obtain meds from pharmacy list.   Expected Discharge Plan: Home/Self Care Barriers to Discharge: Inadequate or no insurance, Financial Resources (pcp needed)  Expected Discharge Plan and Services   Discharge Planning Services: MATCH Program   Living arrangements for the past 2 months: Apartment                                       Social Determinants of Health (SDOH) Interventions SDOH Screenings   Housing: Patient Declined (09/14/2022)  Depression (PHQ2-9): Low Risk  (05/27/2020)  Tobacco Use: High Risk (09/15/2022)    Readmission Risk Interventions     No data to display
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
## 2022-09-21 NOTE — Plan of Care (Signed)
  Problem: Clinical Measurements: Goal: Will remain free from infection Outcome: Progressing   Problem: Pain Managment: Goal: General experience of comfort will improve Outcome: Progressing   Problem: Safety: Goal: Ability to remain free from injury will improve Outcome: Progressing   

## 2022-09-21 NOTE — TOC Transition Note (Signed)
Transition of Care [facility]) - CM/SW Discharge Note   Patient Details  Name: Peter [name] MRN: [id number] Date of Birth: 1980/03/19  Transition of Care [facility]) CM/SW Contact:  Harriett Sine, RN Phone [phone]  09/21/2022, 1:44 PM   Clinical Narrative:    Spoke with pt at bedside about follow-up appt sched for Oct. 9 2024 with pcp. Pt asked to call and canc if he can't keep the appt. Pt. given resources for housing assistance and the [facility] program. Pt d/c with private transportation to home and selfcare.      Final next level of care: Home/Self Care Barriers to Discharge: Inadequate or no insurance, Financial Resources (pcp needed)   Patient Goals and CMS Choice      Discharge Placement                         Discharge Plan and Services Additional resources added to the After Visit Summary for     Discharge Planning Services: MATCH Program                                 Social Determinants of Health (SDOH) Interventions SDOH Screenings   Housing: Patient Declined (09/14/2022)  Depression (PHQ2-9): Low Risk  (05/27/2020)  Tobacco Use: High Risk (09/15/2022)     Readmission Risk Interventions     No data to display

## 2022-09-21 NOTE — Inpatient Diabetes Management (Addendum)
Inpatient Diabetes Program Recommendations  AACE/ADA: New Consensus Statement on Inpatient Glycemic Control (2015)  Target Ranges:  Prepandial:   less than 140 mg/dL      Peak postprandial:   less than 180 mg/dL (1-2 hours)      Critically ill patients:  140 - 180 mg/dL   Lab Results  Component Value Date   GLUCAP 146 (H) 09/21/2022   HGBA1C 8.9 (H) 09/12/2022    Review of Glycemic Control  Latest Reference Range & Units 09/20/22 07:51 09/20/22 11:43 09/20/22 17:06 09/20/22 20:53 09/21/22 07:19  Glucose-Capillary 70 - 99 mg/dL 932 (H) 355 (H) 732 (H) 77 146 (H)  (H): Data is abnormally high  Diabetes history: DM2 Outpatient Diabetes medications: Metformin 1000 mg BID, Glipizide 5 mg BID (not taking meds) Current orders for Inpatient glycemic control:  Semglee 30 units BID Novolog 0-15 units TID and 0-5 units at bedtime Novolog 8 units TID  Discharge Recommendations: Other recommendations: Discontinue Glipizide Intermediate acting recommendations: insulin isophane & regular (NOVOLIN 70/30 RELION PEN) ([facility] Only) 36 units BID  Supply/Referral recommendations: Pen needles - standard   Use Adult Diabetes Insulin Treatment Post Discharge order set.  70/30 36 units BID is equal to 50.4 units of basal daily and 10.8 units of meal coverage BID.   Educated patient on Novolin ReliOn 70/30 insulin.  He should administer with BF and dinner.  Discussed hypoglycemia, signs, symptoms and treatments.   He he is consistently < 100 mg/dL he should call PCP and titrate insulin down.  He verbalizes understanding.     Will continue to follow while inpatient.  Thank you, Dulce Sellar, MSN, CDCES Diabetes Coordinator Inpatient Diabetes Program [phone] (team pager from 8a-5p)

## 2022-09-21 NOTE — Discharge Instructions (Addendum)
Peter [name],  You were in the hospital with acute pancreatitis. You have improved with treatment. This may or may not have been related to gallstones. 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 Initial biopsy of a lymph node did not suggest cancer, but this will need to be watched closely. We did find that you had a blood clot in a vein in your abdomen and need to discharge on a blood thinner; you will likely need medication assistance so please follow-up with your PCP visit. Please follow-up with the general surgeon in addition to the medical oncologist.

## 2022-10-04 ENCOUNTER — Ambulatory Visit: Payer: Self-pay | Admitting: Student

## 2022-10-13 ENCOUNTER — Emergency Department (HOSPITAL_COMMUNITY): Payer: Self-pay

## 2022-10-13 ENCOUNTER — Encounter (HOSPITAL_COMMUNITY): Payer: Self-pay

## 2022-10-13 ENCOUNTER — Other Ambulatory Visit: Payer: Self-pay

## 2022-10-13 ENCOUNTER — Emergency Department (HOSPITAL_BASED_OUTPATIENT_CLINIC_OR_DEPARTMENT_OTHER): Admit: 2022-10-13 | Discharge: 2022-10-13 | Disposition: A | Discharge status: Home / Self Care | Payer: Self-pay

## 2022-10-13 ENCOUNTER — Emergency Department (HOSPITAL_COMMUNITY)
Admission: EM | Admit: 2022-10-13 | Discharge: 2022-10-13 | Disposition: A | Discharge status: Home / Self Care | Payer: Self-pay | Attending: Emergency Medicine | Admitting: Emergency Medicine

## 2022-10-13 DIAGNOSIS — R000 Tachycardia, unspecified: Secondary | ICD-10-CM | POA: Insufficient documentation

## 2022-10-13 DIAGNOSIS — Z794 Long term (current) use of insulin: Secondary | ICD-10-CM | POA: Insufficient documentation

## 2022-10-13 DIAGNOSIS — Z7901 Long term (current) use of anticoagulants: Secondary | ICD-10-CM | POA: Insufficient documentation

## 2022-10-13 DIAGNOSIS — M7989 Other specified soft tissue disorders: Secondary | ICD-10-CM

## 2022-10-13 DIAGNOSIS — Z79899 Other long term (current) drug therapy: Secondary | ICD-10-CM | POA: Insufficient documentation

## 2022-10-13 DIAGNOSIS — R0602 Shortness of breath: Secondary | ICD-10-CM | POA: Insufficient documentation

## 2022-10-13 DIAGNOSIS — F10230 Alcohol dependence with withdrawal, uncomplicated: Secondary | ICD-10-CM | POA: Insufficient documentation

## 2022-10-13 DIAGNOSIS — I10 Essential (primary) hypertension: Secondary | ICD-10-CM | POA: Insufficient documentation

## 2022-10-13 DIAGNOSIS — F10939 Alcohol use, unspecified with withdrawal, unspecified: Secondary | ICD-10-CM

## 2022-10-13 DIAGNOSIS — Z7984 Long term (current) use of oral hypoglycemic drugs: Secondary | ICD-10-CM | POA: Insufficient documentation

## 2022-10-13 LAB — CBC
HCT: 42.7 % (ref 39.0–52.0)
Hemoglobin: 15.6 g/dL (ref 13.0–17.0)
MCH: 33.8 pg (ref 26.0–34.0)
MCHC: 36.5 g/dL — ABNORMAL HIGH (ref 30.0–36.0)
MCV: 92.6 fL (ref 80.0–100.0)
Platelets: 118 10*3/uL — ABNORMAL LOW (ref 150–400)
RBC: 4.61 MIL/uL (ref 4.22–5.81)
RDW: 11.8 % (ref 11.5–15.5)
WBC: 7.6 10*3/uL (ref 4.0–10.5)
nRBC: 0 % (ref 0.0–0.2)

## 2022-10-13 LAB — BASIC METABOLIC PANEL
Anion gap: 13 (ref 5–15)
BUN: 6 mg/dL (ref 6–20)
CO2: 26 mmol/L (ref 22–32)
Calcium: 8.5 mg/dL — ABNORMAL LOW (ref 8.9–10.3)
Chloride: 96 mmol/L — ABNORMAL LOW (ref 98–111)
Creatinine, Ser: 0.61 mg/dL (ref 0.61–1.24)
GFR, Estimated: >60 mL/min (ref >60)
Glucose, Bld: 214 mg/dL — ABNORMAL HIGH (ref 70–99)
Potassium: 3.6 mmol/L (ref 3.5–5.1)
Sodium: 135 mmol/L (ref 135–145)

## 2022-10-13 LAB — TROPONIN I (HIGH SENSITIVITY)
Troponin I (High Sensitivity): 3 ng/L (ref <18)
Troponin I (High Sensitivity): 3 ng/L (ref <18)

## 2022-10-13 LAB — BRAIN NATRIURETIC PEPTIDE: B Natriuretic Peptide: 11.6 pg/mL (ref 0.0–100.0)

## 2022-10-13 MED ORDER — THIAMINE MONONITRATE 100 MG PO TABS
100.0000 mg | ORAL_TABLET | Freq: Every day | ORAL | Status: DC
  Administered 2022-10-13: 100 mg via ORAL
  Filled 2022-10-13: qty 1

## 2022-10-13 MED ORDER — LORAZEPAM 1 MG PO TABS: 0.0000 mg | ORAL_TABLET | Freq: Two times a day (BID) | ORAL | Status: DC

## 2022-10-13 MED ORDER — LORAZEPAM 1 MG PO TABS
1.0000 mg | ORAL_TABLET | Freq: Three times a day (TID) | ORAL | 0 refills | Status: AC | PRN
Start: 2022-10-13 — End: ?

## 2022-10-13 MED ORDER — SODIUM CHLORIDE (PF) 0.9 % IJ SOLN
INTRAMUSCULAR | Status: AC
  Filled 2022-10-13: qty 50

## 2022-10-13 MED ORDER — IOHEXOL 350 MG/ML SOLN
75.0000 mL | Freq: Once | INTRAVENOUS | Status: AC | PRN
  Administered 2022-10-13: 75 mL via INTRAVENOUS

## 2022-10-13 MED ORDER — LORAZEPAM 1 MG PO TABS
0.0000 mg | ORAL_TABLET | Freq: Four times a day (QID) | ORAL | Status: DC
  Administered 2022-10-13: 1 mg via ORAL
  Filled 2022-10-13: qty 1

## 2022-10-13 MED ORDER — LORAZEPAM 2 MG/ML IJ SOLN: 0.0000 mg | Freq: Four times a day (QID) | INTRAMUSCULAR | Status: DC

## 2022-10-13 MED ORDER — IOHEXOL 300 MG/ML  SOLN: 100.0000 mL | Freq: Once | INTRAMUSCULAR | Status: DC | PRN

## 2022-10-13 MED ORDER — THIAMINE HCL 100 MG/ML IJ SOLN: 100.0000 mg | Freq: Every day | INTRAMUSCULAR | Status: DC

## 2022-10-13 MED ORDER — LORAZEPAM 2 MG/ML IJ SOLN
1.0000 mg | Freq: Once | INTRAMUSCULAR | Status: AC
  Administered 2022-10-13: 1 mg via INTRAVENOUS
  Filled 2022-10-13: qty 1

## 2022-10-13 MED ORDER — LORAZEPAM 2 MG/ML IJ SOLN: 0.0000 mg | Freq: Two times a day (BID) | INTRAMUSCULAR | Status: DC

## 2022-10-13 NOTE — ED Triage Notes (Signed)
Pt coming in today complaining of bilateral leg swelling, states the right leg is more swollen than the other. Endorses "little bit of pain" but no redness. Pt is able to bear weight, but not able to walk around very well. Pt also states that he is currently going through alcohol withdrawal, last drink 6 hrs ago and hoping for treatment for that too.

## 2022-10-13 NOTE — ED Provider Notes (Signed)
[place] [facility]ERGENC[facility] DEPARTMENT AT [facility] Provider Note   CSN: [id number] Arrival date & time: 10/13/22  1101     History {Add pertinent medical, surgical, social history, OB history to HPI:1} Chief Complaint  Patient presents with   Alcohol Problem   Leg Swelling   HPI Peter [name] is a 42 y.o. male with alcohol dependence, hypertension, type 2 diabetes, recent splenic thrombosis on Eliquis presenting for leg swelling and alcohol problem.  States leg swelling has been in both legs, is intermittent and started about 2 weeks ago.  There is notably worse in the right leg.  Seems to be worse with ambulation.  Denies some mild pain in both legs as well primarily in the lower extremities.  Endorses shortness of breath but no chest pain.  Also states that he is in follow-up withdrawal.  Last drink was 6 hours ago.  States he normally drinks 10-12 cocktails per day and sometimes more.  States he has been taking his Eliquis as prescribed. Denies fever.       Alcohol Problem       Home Medications Prior to Admission medications   Medication Sig Start Date End Date Taking? Authorizing Provider  amLODipine (NORVASC) 10 MG tablet Take 1 tablet (10 mg total) by mouth daily. 09/21/22 12/20/22  Narda Bonds, MD  apixaban (ELIQUIS) 5 MG TABS tablet Take 2 tablets (10 mg total) by mouth 2 (two) times daily for 5 days, THEN 1 tablet (5 mg total) 2 (two) times daily. 09/21/22 12/18/22  Narda Bonds, MD  cloNIDine (CATAPRES) 0.2 MG tablet Take 1 tablet (0.2 mg total) by mouth 2 (two) times daily. 09/21/22 12/20/22  Narda Bonds, MD  folic acid (FOLVITE) 1 MG tablet Take 1 tablet (1 mg total) by mouth daily. 09/22/22 12/21/22  Narda Bonds, MD  insulin isophane & regular human KwikPen (NOVOLIN 70/30 KWIKPEN) (70-30) 100 UNIT/ML KwikPen Inject 30 Units into the skin 2 (two) times daily with a meal. 09/21/22   Narda Bonds, MD  Insulin Pen Needle (PEN NEEDLES 3/16") 31G X 5 MM  MISC Use with insulin pen once daily. 09/21/22   Narda Bonds, MD  metFORMIN (GLUCOPHAGE) 1000 MG tablet Take 1 tablet (1,000 mg total) by mouth 2 (two) times daily with a meal. 09/21/22 12/20/22  Narda Bonds, MD  Multiple Vitamin (MULTIVITAMIN WITH MINERALS) TABS tablet Take 1 tablet by mouth daily. 09/22/22 12/21/22  Narda Bonds, MD  simvastatin (ZOCOR) 20 MG tablet Take 1 tablet (20 mg total) by mouth at bedtime. 09/21/22 12/20/22  Narda Bonds, MD  venlafaxine XR (EFFEXOR-XR) 75 MG 24 hr capsule Take 1 capsule (75 mg total) by mouth daily with breakfast. Patient not taking: Reported on 09/13/2022 10/31/20 11/30/20  Lorin Glass, MD      Allergies    Patient has no known allergies.    Review of Systems   See HPI for pertinent positives   Physical Exam   Vitals:   10/13/22 1240 10/13/22 1351  BP: (!) 121/90 133/88  Pulse:  74  Resp: 17 18  Temp:    SpO2: 98% 97%    CONSTITUTIONAL:  well-appearing, NAD NEURO:  Alert and oriented x 3, CN 3-12 grossly intact EYES:  eyes equal and reactive ENT/NECK:  Supple, no stridor, mild tongue fasciculations CARDIO:  Regular rate and rhythm, appears well-perfused  PULM:  No respiratory distress, CTAB GI/GU:  non-distended, soft MSK/SPINE:  No gross deformities,moves all extremities,  hands are mildly tremulous bilaterally, 1+ nonpitting edema around the right ankle noted.  No calf tenderness. SKIN:  no rash, atraumatic  *Additional and/or pertinent findings included in MDM below  ED Results / Procedures / Treatments   Labs (all labs ordered are listed, but only abnormal results are displayed) Labs Reviewed  BASIC METABOLIC PANEL - Abnormal; Notable for the following components:      Result Value   Chloride 96 (*)    Glucose, Bld 214 (*)    Calcium 8.5 (*)    All other components within normal limits  CBC - Abnormal; Notable for the following components:   MCHC 36.5 (*)    Platelets 118 (*)    All other components within normal  limits  BRAIN NATRIURETIC PEPTIDE  TROPONIN I (HIGH SENSITIVITY)  TROPONIN I (HIGH SENSITIVITY)    EKG None  Radiology CT Angio Chest PE W/Cm &/Or Wo Cm  Result Date: 10/13/2022 CLINICAL DATA:  Pulmonary embolism (PE) suspected, high prob bilateral leg swelling. Difficulty breathing. EXAM: CT ANGIOGRAPHY CHEST WITH CONTRAST TECHNIQUE: Multidetector CT imaging of the chest was performed using the standard protocol during bolus administration of intravenous contrast. Multiplanar CT image reconstructions and MIPs were obtained to evaluate the vascular anatomy. RADIATION DOSE REDUCTION: This exam was performed according to the departmental dose-optimization program which includes automated exposure control, adjustment of the mA and/or kV according to patient size and/or use of iterative reconstruction technique. CONTRAST:  75mL OMNIPAQUE IOHEXOL 350 MG/ML SOLN COMPARISON:  CT scan chest from 09/12/2022. FINDINGS: Cardiovascular: No evidence of embolism to the proximal subsegmental pulmonary artery level. Normal cardiac size. No pericardial effusion. No aortic aneurysm. Mediastinum/Nodes: Visualized thyroid gland appears grossly unremarkable. No solid / cystic mediastinal masses. The esophagus is nondistended precluding optimal assessment. No axillary, mediastinal or hilar lymphadenopathy by size criteria. Lungs/Pleura: The central tracheo-bronchial tree is patent. There are dependent changes in bilateral lungs. There is a tubular opacity with diameter up to 3.4 mm in the right lung upper lobe (series 12, image 56), which is new since the prior study and may represent mucoid impaction/bronchocele. There are adjacent several 5 mm or smaller sized nodules as well which are indeterminate. There is also small opacity in the posterior segment of right upper lobe abutting the major fissure (series 12, image 46), which is also new since the prior study. This may represent focal pneumonitis. No suspicious mass or  dense consolidation. No pleural effusion or pneumothorax. No suspicious lung nodules. Upper Abdomen: Tiny volume dependent calcified gallstones noted without imaging signs of acute cholecystitis. There is fat stranding and nonspecific soft tissue attenuation nodules in the perisplenic region, which is indeterminate but appear grossly similar to the prior study and may represent sequela of previous episodes of pancreatitis. Remaining visualized upper abdominal viscera within normal limits. Musculoskeletal: The visualized soft tissues of the chest wall are grossly unremarkable. No suspicious osseous lesions. Review of the MIP images confirms the above findings. IMPRESSION: 1. No evidence of pulmonary embolism. 2. New tubular opacity in the right lung upper lobe with diameter up to 3.4 mm, which may represent mucoid impaction/bronchocele. There are adjacent several 5 mm or smaller nodules as well, which are indeterminate and may represent sequela of infection or inflammation. 3. New small opacity in the posterior segment of right upper lobe abutting the major fissure, which may represent focal pneumonitis. 4. Multiple other nonacute observations, as described above. Electronically Signed   By: Jules Schick M.D.   On: 10/13/2022 14:25  DG Chest 1 View  Result Date: 10/13/2022 CLINICAL DATA:  Shortness of breath. EXAM: CHEST  1 VIEW COMPARISON:  September 12, 2022. FINDINGS: The heart size and mediastinal contours are within normal limits. Both lungs are clear. The visualized skeletal structures are unremarkable. IMPRESSION: No active disease. Electronically Signed   By: Lupita Raider M.D.   On: 10/13/2022 12:36   VAS [place] LOWER EXTREMITY VENOUS (DVT) (7a-7p)  Result Date: 10/13/2022  Lower Venous DVT Study Patient Name:  Peter [name]  Date of Exam:   10/13/2022 Medical Rec #: [id number]           Accession #:    [id number] Date of Birth: [birth year]          Patient Gender: M Patient Age:   26 years Exam  [facility]  [facility] Procedure:      VAS [place] LOWER EXTREMITY VENOUS (DVT) Referring Phys: Riki Sheer --------------------------------------------------------------------------------  Indications: Swelling.  Risk Factors: None identified. Comparison Study: No prior studies. Performing Technologist: Chanda Busing RVT  Examination Guidelines: A complete evaluation includes B-mode imaging, spectral Doppler, color Doppler, and power Doppler as needed of all accessible portions of each vessel. Bilateral testing is considered an integral part of a complete examination. Limited examinations for reoccurring indications may be performed as noted. The reflux portion of the exam is performed with the patient in reverse Trendelenburg.  +---------+---------------+---------+-----------+----------+--------------+ RIGHT    CompressibilityPhasicitySpontaneityPropertiesThrombus Aging +---------+---------------+---------+-----------+----------+--------------+ CFV      Full           Yes      Yes                                 +---------+---------------+---------+-----------+----------+--------------+ SFJ      Full                                                        +---------+---------------+---------+-----------+----------+--------------+ FV Prox  Full                                                        +---------+---------------+---------+-----------+----------+--------------+ FV Mid   Full                                                        +---------+---------------+---------+-----------+----------+--------------+ FV DistalFull                                                        +---------+---------------+---------+-----------+----------+--------------+ PFV      Full                                                        +---------+---------------+---------+-----------+----------+--------------+  POP      Full           Yes      Yes                                  +---------+---------------+---------+-----------+----------+--------------+ PTV      Full                                                        +---------+---------------+---------+-----------+----------+--------------+ PERO     Full                                                        +---------+---------------+---------+-----------+----------+--------------+   +----+---------------+---------+-----------+----------+--------------+ LEFTCompressibilityPhasicitySpontaneityPropertiesThrombus Aging +----+---------------+---------+-----------+----------+--------------+ CFV Full           Yes      Yes                                 +----+---------------+---------+-----------+----------+--------------+     Summary: RIGHT: - There is no evidence of deep vein thrombosis in the lower extremity.  - No cystic structure found in the popliteal fossa.  LEFT: - No evidence of common femoral vein obstruction.   *See table(s) above for measurements and observations.    Preliminary     Procedures Procedures  {Document cardiac monitor, telemetry assessment procedure when appropriate:1}  Medications Ordered in ED Medications  thiamine (VITAMIN B1) tablet 100 mg (100 mg Oral Given 10/13/22 1206)    Or  thiamine (VITAMIN B1) injection 100 mg ( Intravenous See Alternative 10/13/22 1206)  iohexol (OMNIPAQUE) 350 MG/ML injection 75 mL (75 mLs Intravenous Contrast Given 10/13/22 1259)  LORazepam (ATIVAN) injection 1 mg (1 mg Intravenous Given 10/13/22 1449)    ED Course/ Medical Decision Making/ A&P   {   Click here for ABCD2, HEART and other calculatorsREFRESH Note before signing :1}                              Medical Decision Making Amount and/or Complexity of Data Reviewed Labs: ordered. Radiology: ordered.  Risk OTC drugs. Prescription drug management.   Initial Impression and Ddx 42 year old well-appearing male presenting for concern for alcohol withdrawal and  lower extremity edema and shortness of breath.  Exam notable for tremulous hands and tongue fasciculations along with right ankle edema.  DDx includes alcohol withdrawal, PE, DVT, ACS, and pneumonia. Patient PMH that increases complexity of ED encounter: Alcohol use disorder  Interpretation of Diagnostics I independent reviewed and interpreted the labs as followed: ***  - I independently visualized the following imaging with scope of interpretation limited to determining acute life threatening conditions related to emergency care: ***, which revealed ***  Patient Reassessment and Ultimate Disposition/Management ***  Patient management required discussion with the following services or consulting groups:  {BEROCONSULT:26841}  Complexity of Problems Addressed {BEROCOPA:26833}  Additional Data Reviewed and Analyzed Further history obtained from: {BERODATA:26834}  Patient Encounter Risk Assessment {BERORISK:26838}   {Document critical care time when appropriate:1} {Document review  of labs and clinical decision tools ie heart score, Chads2Vasc2 etc:1}  {Document your independent review of radiology images, and any outside records:1} {Document your discussion with family members, caretakers, and with consultants:1} {Document social determinants of health affecting pt's care:1} {Document your decision making why or why not admission, treatments were needed:1} Final Clinical Impression(s) / ED Diagnoses Final diagnoses:  None    Rx / DC Orders ED Discharge Orders     None

## 2022-10-13 NOTE — Discharge Instructions (Signed)
Evaluation today was overall reassuring. Suspect you are in alcohol withdrawal.  Sending Ativan to your pharmacy to be taken as needed for your symptoms.  If you have a seizure, hallucinations, nausea vomiting or any other concerning symptom please return emergency department further evaluation.

## 2022-10-13 NOTE — Progress Notes (Signed)
Right lower extremity venous duplex has been completed. Preliminary results can be found in CV Proc through chart review.  Results were given to Riki Sheer PA.  10/13/22 12:22 PM Olen Cordial RVT

## 2022-10-13 NOTE — ED Provider Notes (Signed)
I provided a substantive portion of the care of this patient.  I personally made/approved the management plan for this patient and take responsibility for the patient management.    Patient presented to ED with complaints of leg swelling.  Also concerned about the possibility of alcohol withdrawal.  On my exam patient appears comfortable.  He is not tachycardic or hypertensive.  I do not appreciate any significant tremors.  Low suspicion for severe alcohol withdrawal at this time.  Patient does appear appropriate for outpatient management.  Will provide benzodiazepines.  Also will recommend close outpatient follow-up for his alcohol use disorder.  Will provide information about outpatient treatment centers.   Linwood Dibbles, MD 10/13/22 [phone]

## 2022-10-21 ENCOUNTER — Other Ambulatory Visit (HOSPITAL_COMMUNITY): Payer: Self-pay

## 2022-10-24 DIAGNOSIS — E785 Hyperlipidemia, unspecified: Secondary | ICD-10-CM | POA: Insufficient documentation

## 2022-10-24 DIAGNOSIS — Z7901 Long term (current) use of anticoagulants: Secondary | ICD-10-CM | POA: Insufficient documentation

## 2022-10-26 ENCOUNTER — Other Ambulatory Visit (HOSPITAL_COMMUNITY): Payer: Self-pay

## 2022-11-29 ENCOUNTER — Ambulatory Visit (INDEPENDENT_AMBULATORY_CARE_PROVIDER_SITE_OTHER): Payer: Self-pay | Admitting: Family

## 2022-11-29 VITALS — BP 134/89 | HR 123 | Temp 98.1°F | Resp 16 | Ht 70.0 in | Wt 185.6 lb

## 2022-11-29 DIAGNOSIS — Z7984 Long term (current) use of oral hypoglycemic drugs: Secondary | ICD-10-CM

## 2022-11-29 DIAGNOSIS — Z0189 Encounter for other specified special examinations: Secondary | ICD-10-CM

## 2022-11-29 DIAGNOSIS — E1165 Type 2 diabetes mellitus with hyperglycemia: Secondary | ICD-10-CM

## 2022-11-29 DIAGNOSIS — E782 Mixed hyperlipidemia: Secondary | ICD-10-CM

## 2022-11-29 DIAGNOSIS — Z7689 Persons encountering health services in other specified circumstances: Secondary | ICD-10-CM

## 2022-11-29 DIAGNOSIS — E119 Type 2 diabetes mellitus without complications: Secondary | ICD-10-CM

## 2022-11-29 DIAGNOSIS — I10 Essential (primary) hypertension: Secondary | ICD-10-CM

## 2022-11-29 DIAGNOSIS — Z794 Long term (current) use of insulin: Secondary | ICD-10-CM

## 2022-11-29 LAB — POCT HEMOGLOBIN: Hemoglobin: 8.4 g/dL — AB (ref 11–14.6)

## 2022-11-29 MED ORDER — PEN NEEDLES 3/16" 31G X 5 MM MISC
1.0000 | Freq: Two times a day (BID) | 2 refills | Status: DC
Start: 2022-11-29 — End: 2023-01-23

## 2022-11-29 MED ORDER — CLONIDINE HCL 0.2 MG PO TABS: 0.2000 mg | ORAL_TABLET | Freq: Two times a day (BID) | ORAL | 0 refills | Status: DC

## 2022-11-29 MED ORDER — AMLODIPINE BESYLATE 10 MG PO TABS
10.0000 mg | ORAL_TABLET | Freq: Every day | ORAL | 0 refills | Status: DC
Start: 2022-11-29 — End: 2023-01-23

## 2022-11-29 MED ORDER — NOVOLIN 70/30 FLEXPEN RELION (70-30) 100 UNIT/ML ~~LOC~~ SUPN
30.0000 [IU] | PEN_INJECTOR | Freq: Two times a day (BID) | SUBCUTANEOUS | 3 refills | Status: DC
Start: 2022-11-29 — End: 2023-01-23

## 2022-11-29 MED ORDER — METFORMIN HCL 1000 MG PO TABS: 1000.0000 mg | ORAL_TABLET | Freq: Two times a day (BID) | ORAL | 0 refills | Status: AC

## 2022-11-29 MED ORDER — SIMVASTATIN 20 MG PO TABS: 20.0000 mg | ORAL_TABLET | Freq: Every day | ORAL | 0 refills | Status: DC

## 2022-11-29 NOTE — Addendum Note (Signed)
Addended by: Kieth Brightly on: 11/29/2022 04:32 PM   Modules accepted: Orders

## 2022-11-29 NOTE — Progress Notes (Signed)
Subjective:    Peter [name] - 42 y.o. male MRN [id number]  Date of birth: 15-Aug-1980  HPI  Peter [name] is to establish care.  Current issues and/or concerns: - Doing well on Amlodipine and Clonidine, no issues/concerns. He does not complain of red flag symptoms such as but not limited to chest pain, shortness of breath, worst headache of life, nausea/vomiting.  - Doing well on Metformin, no issues/concerns. States he is only taking Novolin 70/30 when his blood sugars are over 140 due to someone in his family told him he should do this. He denies red flag symptoms associated with diabetes.  - Doing well on Atorvastatin, no issues/concerns.  - Upcoming appointment with Behavioral Health. He denies thoughts of self-harm, suicidal ideations, homicidal ideations.   ROS per HPI     Health Maintenance:  Health Maintenance Due  Topic Date Due   Diabetic kidney evaluation - Urine ACR  Never done     Past Medical History: Patient Active Problem List   Diagnosis Date Noted   Anticoagulant long-term use 10/24/2022   HLD (hyperlipidemia) 10/24/2022   Acute thrombosis of splenic vein 09/21/2022   Pancreatic mass 09/13/2022   Hepatitis 09/12/2022   Malignant hypertension 10/29/2020   Uncontrolled type 2 diabetes mellitus with hyperglycemia, without long-term current use of insulin (HCC) 10/29/2020   Alcohol withdrawal delirium, acute, hyperactive (HCC) 10/29/2020   Type 2 diabetes mellitus, without long-term current use of insulin (HCC) 07/05/2020   Lactic acidosis 04/04/2020   DKA (diabetic ketoacidosis) (HCC) 04/04/2020   Pancreatitis, acute 04/04/2020   High anion gap metabolic acidosis    Generalized anxiety disorder 01/29/2019   Depression 01/29/2019   MDD (major depressive disorder), recurrent episode, moderate (HCC) 10/13/2018   Right tibial fracture 01/21/2018   Chest pain 05/20/2017   Anxiety 04/11/2017   Current mild episode of major depressive disorder (HCC)  04/11/2017   Hypomagnesemia 02/15/2017   Abdominal pain 02/14/2017   Acute pancreatitis 02/14/2017   Alcoholic hepatitis without ascites 02/14/2017   Hypokalemia 12/03/2016   Leukocytosis 12/03/2016   Alcohol withdrawal (HCC) 12/02/2016   Alcohol dependence with other alcohol-induced disorder (HCC) 11/18/2016   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   Essential hypertension 11/18/2016   Elevated liver enzymes 11/14/2016      Social History   reports that he has been smoking cigarettes. He has never used smokeless tobacco. He reports current alcohol use. He reports that he does not use drugs.   Family History  family history includes CAD in his father; Cancer in his paternal grandfather; Hyperlipidemia in his paternal grandfather; Hypertension in his father and paternal grandfather; Stroke in his father.   Medications: reviewed and updated   Objective:   Physical Exam BP 134/89   Pulse (!) 123   Temp 98.1 F (36.7 C) (Oral)   Resp 16   Ht 5\' 10"  (1.778 m)   Wt 185 lb 9.6 oz (84.2 kg)   SpO2 95%   BMI 26.63 kg/m   Physical Exam HENT:     Head: Normocephalic and atraumatic.     Nose: Nose normal.     Mouth/Throat:     Mouth: Mucous membranes are moist.     Pharynx: Oropharynx is clear.  Eyes:     Extraocular Movements: Extraocular movements intact.     Conjunctiva/sclera: Conjunctivae normal.     Pupils: Pupils are equal, round, and reactive to light.  Cardiovascular:     Rate and Rhythm: Tachycardia present.     Pulses: Normal  pulses.     Heart sounds: Normal heart sounds.  Pulmonary:     Effort: Pulmonary effort is normal.     Breath sounds: Normal breath sounds.  Musculoskeletal:        General: Normal range of motion.     Cervical back: Normal range of motion and neck supple.  Neurological:     General: No focal deficit present.     Mental Status: He is alert and oriented to person, place, and time.  Psychiatric:        Mood and Affect: Mood normal.        Behavior:  Behavior normal.    Results for orders placed or performed in visit on 11/29/22  POCT hemoglobin  Result Value Ref Range   Hemoglobin 8.4 (A) 11 - 14.6 g/dL    Assessment & Plan:  1. Encounter to establish care - Patient presents today to establish care. During the interim follow-up with primary provider as scheduled.  - Return for annual physical examination, labs, and health maintenance. Arrive fasting meaning having no food for at least 8 hours prior to appointment. You may have only water or black coffee. Please take scheduled medications as normal.  2. Primary hypertension - Continue Amlodipine and Clonidine as prescribed.  - Counseled on blood pressure goal of less than 130/80, low-sodium, DASH diet, medication compliance, and 150 minutes of moderate intensity exercise per week as tolerated. Counseled on medication adherence and adverse effects. - Follow-up with primary provider in 3 months or sooner if needed.  - amLODipine (NORVASC) 10 MG tablet; Take 1 tablet (10 mg total) by mouth daily.  Dispense: 90 tablet; Refill: 0 - cloNIDine (CATAPRES) 0.2 MG tablet; Take 1 tablet (0.2 mg total) by mouth 2 (two) times daily.  Dispense: 180 tablet; Refill: 0  3. Type 2 diabetes mellitus with hyperglycemia, with long-term current use of insulin (HCC) - Hemoglobin A1c above goal at 8.4%, goal 7%.  - Continue Metformin as prescribed. Counseled on medication adherence/adverse effects.  - Insulin Isophane & Regular Human as prescribed. Counseled on medication adherence/adverse effects.  - Routine screening.  - Discussed the importance of healthy eating habits, low-carbohydrate diet, low-sugar diet, regular aerobic exercise (at least 150 minutes a week as tolerated) and medication compliance to achieve or maintain control of diabetes. - Follow-up with primary provider in 4 weeks or sooner if needed.  - POCT hemoglobin - Microalbumin / creatinine urine ratio - metFORMIN (GLUCOPHAGE) 1000 MG  tablet; Take 1 tablet (1,000 mg total) by mouth 2 (two) times daily with a meal.  Dispense: 180 tablet; Refill: 0 - insulin isophane & regular human KwikPen (NOVOLIN 70/30 KWIKPEN) (70-30) 100 UNIT/ML KwikPen; Inject 30 Units into the skin 2 (two) times daily with a meal.  Dispense: 15 mL; Refill: 3 - Insulin Pen Needle (PEN NEEDLES 3/16") 31G X 5 MM MISC; 1 each by Other route in the morning and at bedtime. Use with insulin pen once daily.  Dispense: 100 each; Refill: 2  4. Diabetic eye exam [facility]) - Referral to Ophthalmology for evaluation/management.  - Ambulatory referral to Ophthalmology  5. Encounter for diabetic foot exam [facility]) - Referral to Podiatry for evaluation/management.  - Ambulatory referral to Podiatry  6. Mixed hyperlipidemia - Continue Simvastatin as prescribed. Counseled on medication adherence/adverse effects.  - Follow-up with primary provider as scheduled.  - simvastatin (ZOCOR) 20 MG tablet; Take 1 tablet (20 mg total) by mouth at bedtime.  Dispense: 90 tablet; Refill: 0  Patient was given clear instructions to go to Emergency Department or return to medical center if symptoms don't improve, worsen, or new problems develop.The patient verbalized understanding.  I discussed the assessment and treatment plan with the patient. The patient was provided an opportunity to ask questions and all were answered. The patient agreed with the plan and demonstrated an understanding of the instructions.   The patient was advised to call back or seek an in-person evaluation if the symptoms worsen or if the condition fails to improve as anticipated.    Ricky Stabs, NP 11/29/2022, 3:42 PM Primary Care at [facility]

## 2022-12-15 ENCOUNTER — Encounter (HOSPITAL_COMMUNITY): Payer: Self-pay | Admitting: Emergency Medicine

## 2022-12-15 ENCOUNTER — Emergency Department (HOSPITAL_COMMUNITY)
Admission: EM | Admit: 2022-12-15 | Discharge: 2022-12-15 | Discharge status: Against Medical Advice | Payer: Medicaid Other | Attending: Emergency Medicine | Admitting: Emergency Medicine

## 2022-12-15 DIAGNOSIS — F10120 Alcohol abuse with intoxication, uncomplicated: Secondary | ICD-10-CM | POA: Diagnosis present

## 2022-12-15 DIAGNOSIS — Z5321 Procedure and treatment not carried out due to patient leaving prior to being seen by health care provider: Secondary | ICD-10-CM | POA: Insufficient documentation

## 2022-12-15 DIAGNOSIS — Y906 Blood alcohol level of 120-199 mg/100 ml: Secondary | ICD-10-CM | POA: Diagnosis not present

## 2022-12-15 LAB — COMPREHENSIVE METABOLIC PANEL
ALT: 53 U/L — ABNORMAL HIGH (ref 0–44)
AST: 87 U/L — ABNORMAL HIGH (ref 15–41)
Albumin: 3.9 g/dL (ref 3.5–5.0)
Alkaline Phosphatase: 87 U/L (ref 38–126)
Anion gap: 30 — ABNORMAL HIGH (ref 5–15)
BUN: <5 mg/dL — ABNORMAL LOW (ref 6–20)
CO2: 13 mmol/L — ABNORMAL LOW (ref 22–32)
Calcium: 8.9 mg/dL (ref 8.9–10.3)
Chloride: 91 mmol/L — ABNORMAL LOW (ref 98–111)
Creatinine, Ser: 0.78 mg/dL (ref 0.61–1.24)
GFR, Estimated: >60 mL/min (ref >60)
Glucose, Bld: 164 mg/dL — ABNORMAL HIGH (ref 70–99)
Potassium: 3.3 mmol/L — ABNORMAL LOW (ref 3.5–5.1)
Sodium: 134 mmol/L — ABNORMAL LOW (ref 135–145)
Total Bilirubin: 1 mg/dL (ref 0.3–1.2)
Total Protein: 8.1 g/dL (ref 6.5–8.1)

## 2022-12-15 LAB — CBC
HCT: 42.2 % (ref 39.0–52.0)
Hemoglobin: 15.1 g/dL (ref 13.0–17.0)
MCH: 32.3 pg (ref 26.0–34.0)
MCHC: 35.8 g/dL (ref 30.0–36.0)
MCV: 90.4 fL (ref 80.0–100.0)
Platelets: 163 10*3/uL (ref 150–400)
RBC: 4.67 MIL/uL (ref 4.22–5.81)
RDW: 12.8 % (ref 11.5–15.5)
WBC: 15.4 10*3/uL — ABNORMAL HIGH (ref 4.0–10.5)
nRBC: 0 % (ref 0.0–0.2)

## 2022-12-15 LAB — ETHANOL: Alcohol, Ethyl (B): 166 mg/dL — ABNORMAL HIGH (ref <10)

## 2022-12-15 NOTE — ED Triage Notes (Signed)
Pt here from home requesting detox from [facility] , last drink was this morning , drinks daily , no SI/HI

## 2022-12-15 NOTE — ED Notes (Signed)
Pt left [facility] due to long ED wait times. Pt stated he will seek detox help somewhere else.

## 2022-12-27 ENCOUNTER — Ambulatory Visit (HOSPITAL_COMMUNITY): Payer: Self-pay | Admitting: Licensed Clinical Social Worker

## 2022-12-27 ENCOUNTER — Encounter (INDEPENDENT_AMBULATORY_CARE_PROVIDER_SITE_OTHER): Payer: Self-pay | Admitting: Family

## 2022-12-27 ENCOUNTER — Telehealth: Payer: Self-pay | Admitting: Family

## 2022-12-27 NOTE — Telephone Encounter (Signed)
Called pt and left vm to call office back to reschedule missed physical appt

## 2022-12-27 NOTE — Progress Notes (Signed)
Erroneous encounter-disregard

## 2023-01-02 ENCOUNTER — Telehealth: Payer: Self-pay

## 2023-01-02 NOTE — Transitions of Care (Post Inpatient/ED Visit) (Signed)
   01/02/2023  Name: Peter [name] MRN: [id number] DOB: Apr 06, 1980  Today's TOC FU Call Status: Today's TOC FU Call Status:: Unsuccessful Call (1st Attempt) Unsuccessful Call (1st Attempt) Date: 01/02/23  Attempted to reach the patient regarding the most recent Inpatient/ED visit.  Follow Up Plan: Additional outreach attempts will be made to reach the patient to complete the Transitions of Care (Post Inpatient/ED visit) call.   Alyse Low, RN, BA, [facility], CRRN [facility] Coordinator, Transition of Care Ph # [phone]

## 2023-01-03 ENCOUNTER — Telehealth: Payer: Self-pay

## 2023-01-03 NOTE — Transitions of Care (Post Inpatient/ED Visit) (Signed)
   01/03/2023  Name: Peter [name] MRN: [id number] DOB: 10-08-1980  Today's TOC FU Call Status: Today's TOC FU Call Status:: Unsuccessful Call (2nd Attempt) Unsuccessful Call (2nd Attempt) Date: 01/03/23  Attempted to reach the patient regarding the most recent Inpatient/ED visit.  Follow Up Plan: Additional outreach attempts will be made to reach the patient to complete the Transitions of Care (Post Inpatient/ED visit) call.   Alyse Low, RN, BA, [facility], CRRN [facility] Coordinator, Transition of Care Ph # [phone]

## 2023-01-04 ENCOUNTER — Telehealth: Payer: Self-pay

## 2023-01-04 NOTE — Transitions of Care (Post Inpatient/ED Visit) (Signed)
   01/04/2023  Name: LOYCE NEMBHARD MRN: [id number] DOB: Mar 15, 1980  Today's TOC FU Call Status: Today's TOC FU Call Status:: Unsuccessful Call (3rd Attempt) Unsuccessful Call (3rd Attempt) Date: 01/04/23  Attempted to reach the patient regarding the most recent Inpatient/ED visit.  Follow Up Plan: No further outreach attempts will be made at this time. We have been unable to contact the patient.  Alyse Low, RN, BA, [facility], CRRN [facility] Coordinator, Transition of Care Ph # [phone]

## 2023-01-23 ENCOUNTER — Ambulatory Visit (INDEPENDENT_AMBULATORY_CARE_PROVIDER_SITE_OTHER): Payer: MEDICAID | Admitting: Family

## 2023-01-23 ENCOUNTER — Encounter: Payer: Self-pay | Admitting: Family

## 2023-01-23 VITALS — BP 141/93 | HR 144 | Temp 98.0°F | Ht 70.0 in | Wt 170.2 lb

## 2023-01-23 DIAGNOSIS — E1165 Type 2 diabetes mellitus with hyperglycemia: Secondary | ICD-10-CM | POA: Diagnosis not present

## 2023-01-23 DIAGNOSIS — E785 Hyperlipidemia, unspecified: Secondary | ICD-10-CM | POA: Diagnosis not present

## 2023-01-23 DIAGNOSIS — K56609 Unspecified intestinal obstruction, unspecified as to partial versus complete obstruction: Secondary | ICD-10-CM

## 2023-01-23 DIAGNOSIS — R000 Tachycardia, unspecified: Secondary | ICD-10-CM

## 2023-01-23 DIAGNOSIS — Z794 Long term (current) use of insulin: Secondary | ICD-10-CM

## 2023-01-23 DIAGNOSIS — I10 Essential (primary) hypertension: Secondary | ICD-10-CM | POA: Diagnosis not present

## 2023-01-23 MED ORDER — INSULIN GLARGINE 100 UNIT/ML ~~LOC~~ SOLN: 15.0000 [IU] | Freq: Every day | SUBCUTANEOUS | 0 refills | Status: DC

## 2023-01-23 MED ORDER — PEN NEEDLES 3/16" 31G X 5 MM MISC: 1.0000 | Freq: Two times a day (BID) | 2 refills | Status: DC

## 2023-01-23 MED ORDER — METOPROLOL TARTRATE 25 MG PO TABS: 25.0000 mg | ORAL_TABLET | Freq: Three times a day (TID) | ORAL | 0 refills | Status: DC

## 2023-01-23 MED ORDER — INSULIN LISPRO 200 UNIT/ML ~~LOC~~ SOPN
PEN_INJECTOR | SUBCUTANEOUS | 0 refills | Status: DC
Start: 2023-01-23 — End: 2023-02-06

## 2023-01-23 MED ORDER — CLONIDINE HCL 0.2 MG PO TABS: 0.2000 mg | ORAL_TABLET | Freq: Three times a day (TID) | ORAL | 0 refills | Status: DC

## 2023-01-23 MED ORDER — SIMVASTATIN 20 MG PO TABS: 20.0000 mg | ORAL_TABLET | Freq: Every day | ORAL | 0 refills | Status: DC

## 2023-01-23 MED ORDER — AMLODIPINE BESYLATE 10 MG PO TABS: 10.0000 mg | ORAL_TABLET | Freq: Every day | ORAL | 0 refills | Status: AC

## 2023-01-23 NOTE — Progress Notes (Signed)
States no concerns to discuss.

## 2023-01-23 NOTE — Progress Notes (Signed)
Patient ID: ACY OBARA, male    DOB: July 27, 1980  MRN: [id number]  CC: Medication Refills   Subjective: Peter [name] is a 42 y.o. male who presents for medication refills.   His concerns today include:  - Doing well on Amlodipine, Clonidine, and Metoprolol Tartrate, no issues/concerns. He does not complain of red flag symptoms such as but not limited to chest pain, shortness of breath, worst headache of life, nausea/vomiting.  - Doing well on Insulin Glargine and Insulin Lispro, no issues/concerns. Home blood sugars 160's. He denies red flag symptoms associated with diabetes. He is no longer taking Metformin.  - Doing well on Simvastatin, no issues/concerns.  - States he was recently admitted to an external hospital for 17 days related to bowel obstruction. States bowel obstruction was related to alcohol consumption. States since hospital discharge he is no longer consuming alcohol and feeling improved. Reports he attends AA meeting 3 times weekly and going well. Also, today patient presents with updated medication list from hospital discharge.    Patient Active Problem List   Diagnosis Date Noted   Anticoagulant long-term use 10/24/2022   HLD (hyperlipidemia) 10/24/2022   Acute thrombosis of splenic vein 09/21/2022   Pancreatic mass 09/13/2022   Hepatitis 09/12/2022   Malignant hypertension 10/29/2020   Uncontrolled type 2 diabetes mellitus with hyperglycemia, without long-term current use of insulin (HCC) 10/29/2020   Alcohol withdrawal delirium, acute, hyperactive (HCC) 10/29/2020   Type 2 diabetes mellitus, without long-term current use of insulin (HCC) 07/05/2020   Lactic acidosis 04/04/2020   DKA (diabetic ketoacidosis) (HCC) 04/04/2020   Pancreatitis, acute 04/04/2020   High anion gap metabolic acidosis    Generalized anxiety disorder 01/29/2019   Depression 01/29/2019   MDD (major depressive disorder), recurrent episode, moderate (HCC) 10/13/2018   Right tibial  fracture 01/21/2018   Chest pain 05/20/2017   Anxiety 04/11/2017   Current mild episode of major depressive disorder (HCC) 04/11/2017   Hypomagnesemia 02/15/2017   Abdominal pain 02/14/2017   Acute pancreatitis 02/14/2017   Alcoholic hepatitis without ascites 02/14/2017   Hypokalemia 12/03/2016   Leukocytosis 12/03/2016   Alcohol withdrawal (HCC) 12/02/2016   Alcohol dependence with other alcohol-induced disorder (HCC) 11/18/2016   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   Essential hypertension 11/18/2016   Elevated liver enzymes 11/14/2016     Current Outpatient Medications on File Prior to Visit  Medication Sig Dispense Refill   busPIRone (BUSPAR) 10 MG tablet Take by mouth.     LORazepam (ATIVAN) 1 MG tablet Take 1 tablet (1 mg total) by mouth 3 (three) times daily as needed for anxiety. 15 tablet 0   metFORMIN (GLUCOPHAGE) 1000 MG tablet Take 1 tablet (1,000 mg total) by mouth 2 (two) times daily with a meal. 180 tablet 0   venlafaxine XR (EFFEXOR-XR) 75 MG 24 hr capsule Take 1 capsule (75 mg total) by mouth daily with breakfast. 30 capsule 0   No current facility-administered medications on file prior to visit.    No Known Allergies  Social History   Socioeconomic History   Marital status: Single    Spouse name: Not on file   Number of children: Not on file   Years of education: Not on file   Highest education level: Not on file  Occupational History   Not on file  Tobacco Use   Smoking status: Every Day    Current packs/day: 0.50    Types: Cigarettes   Smokeless tobacco: Never  Vaping Use   Vaping status:  Never Used  Substance and Sexual Activity   Alcohol use: Yes   Drug use: No   Sexual activity: Yes    Birth control/protection: Condom  Other Topics Concern   Not on file  Social History Narrative   Not on file   Social Determinants of Health   Financial Resource Strain: Not on file  Food Insecurity: Not on file  Transportation Needs: Not on file  Physical  Activity: Not on file  Stress: Not on file  Social Connections: Not on file  Intimate Partner Violence: Not on file    Family History  Problem Relation Age of Onset   Hypertension Father    Stroke Father    CAD Father    Cancer Paternal Grandfather        Lung   Hyperlipidemia Paternal Grandfather    Hypertension Paternal Grandfather     Past Surgical History:  Procedure Laterality Date   DENTAL SURGERY     ESOPHAGOGASTRODUODENOSCOPY (EGD) WITH PROPOFOL N/A 09/15/2022   Procedure: ESOPHAGOGASTRODUODENOSCOPY (EGD) WITH PROPOFOL;  Surgeon: Willis Modena, MD;  Location: [facility];  Service: Gastroenterology;  Laterality: N/A;   EUS Left 09/15/2022   Procedure: UPPER ENDOSCOPIC ULTRASOUND (EUS) LINEAR;  Surgeon: Willis Modena, MD;  Location: [facility];  Service: Gastroenterology;  Laterality: Left;   FINE NEEDLE ASPIRATION N/A 09/15/2022   Procedure: FINE NEEDLE ASPIRATION (FNA) LINEAR;  Surgeon: Willis Modena, MD;  Location: [facility];  Service: Gastroenterology;  Laterality: N/A;   TIBIA IM NAIL INSERTION Right 01/22/2018   Procedure: RIGHT INTRAMEDULLARY (IM) NAIL TIBIAL;  Surgeon: Sheral Apley, MD;  Location: [facility] OR;  Service: Orthopedics;  Laterality: Right;    ROS: Review of Systems Negative except as stated above  PHYSICAL EXAM: BP (!) 141/93   Pulse (!) 144   Temp 98 F (36.7 C) (Oral)   Ht 5\' 10"  (1.778 m)   Wt 170 lb 3.2 oz (77.2 kg)   SpO2 96%   BMI 24.42 kg/m   Physical Exam HENT:     Head: Normocephalic and atraumatic.     Nose: Nose normal.     Mouth/Throat:     Mouth: Mucous membranes are moist.     Pharynx: Oropharynx is clear.  Eyes:     Extraocular Movements: Extraocular movements intact.     Conjunctiva/sclera: Conjunctivae normal.     Pupils: Pupils are equal, round, and reactive to light.  Cardiovascular:     Rate and Rhythm: Tachycardia present.     Pulses: Normal pulses.     Heart sounds: Normal heart sounds.  Pulmonary:      Effort: Pulmonary effort is normal.     Breath sounds: Normal breath sounds.  Abdominal:     General: Bowel sounds are normal.     Palpations: Abdomen is soft.  Musculoskeletal:        General: Normal range of motion.     Cervical back: Normal range of motion and neck supple.  Neurological:     General: No focal deficit present.     Mental Status: He is alert and oriented to person, place, and time.  Psychiatric:        Mood and Affect: Mood normal.        Behavior: Behavior normal.     ASSESSMENT AND PLAN: 1. Primary hypertension 2. Tachycardia - Blood pressure not at goal during today's visit. Patient asymptomatic without chest pressure, chest pain, palpitations, shortness of breath, worst headache of life, and any additional red flag symptoms. -  Continue Amlodipine and Clonidine as prescribed.  - Increase Metoprolol Tartrate from 25 mg twice daily to 25 mg three times daily.  - Counseled on blood pressure goal of less than 130/80, low-sodium, DASH diet, medication compliance, and 150 minutes of moderate intensity exercise per week as tolerated. Counseled on medication adherence and adverse effects. - Referral to Cardiology for evaluation/management. During the interim follow-up with primary provider in 2 weeks or sooner if needed for blood pressure check until established with referral.  - Ambulatory referral to Cardiology - amLODipine (NORVASC) 10 MG tablet; Take 1 tablet (10 mg total) by mouth daily.  Dispense: 90 tablet; Refill: 0 - cloNIDine (CATAPRES) 0.2 MG tablet; Take 1 tablet (0.2 mg total) by mouth 3 (three) times daily.  Dispense: 270 tablet; Refill: 0 - metoprolol tartrate (LOPRESSOR) 25 MG tablet; Take 1 tablet (25 mg total) by mouth in the morning, at noon, and at bedtime.  Dispense: 270 tablet; Refill: 0  3. Type 2 diabetes mellitus with hyperglycemia, with long-term current use of insulin (HCC) - Continue Insulin Glargine and Insulin Lispro as prescribed. Counseled  on medication adherence/adverse effects. - Hemoglobin A1c result pending.  - Routine screening.  - Discussed the importance of healthy eating habits, low-carbohydrate diet, low-sugar diet, regular aerobic exercise (at least 150 minutes a week as tolerated) and medication compliance to achieve or maintain control of diabetes. - Follow-up with primary provider as scheduled.  - Microalbumin / creatinine urine ratio - POCT glycosylated hemoglobin (Hb A1C) - insulin glargine (LANTUS) 100 UNIT/ML injection; Inject 0.15 mLs (15 Units total) into the skin daily.  Dispense: 15 mL; Refill: 0 - insulin lispro (HUMALOG) 200 UNIT/ML KwikPen; Units to give if BG < 70: Follow hypoglycemia orders. Inject 0-5 units under the skin 3 (three) times a day before meals. PER SLIDING SCALE Units to give for BG 70-140: 0 units Units to give for BG 141-190: 1 unit Units to give for BG 191-240: 2 units Units to give for BG 241-290: 3 units Units to give for BG 291-340: 4 units Units to give for BG 341-390: 5 units Units to give for BG > 390 6 units BG > 391 call PCP.  Dispense: 15 mL; Refill: 0 - Insulin Pen Needle (PEN NEEDLES 3/16") 31G X 5 MM MISC; 1 each by Other route in the morning and at bedtime. Use with insulin pen once daily.  Dispense: 100 each; Refill: 2  4. Hyperlipidemia, unspecified hyperlipidemia type - Continue Simvastatin as prescribed. Counseled on medication adherence/adverse effects.  - Routine screening.  - Referral to Cardiology for evaluation management.  - Follow-up with primary provider as scheduled.  - Ambulatory referral to Cardiology - Lipid panel - simvastatin (ZOCOR) 20 MG tablet; Take 1 tablet (20 mg total) by mouth at bedtime.  Dispense: 90 tablet; Refill: 0  5. Intestinal obstruction, unspecified cause, unspecified whether partial or complete [facility]) - Referral to Gastroenterology for evaluation/management.  - Ambulatory referral to Gastroenterology   Patient was given the opportunity  to ask questions.  Patient verbalized understanding of the plan and was able to repeat key elements of the plan. Patient was given clear instructions to go to Emergency Department or return to medical center if symptoms don't improve, worsen, or new problems develop.The patient verbalized understanding.   Orders Placed This Encounter  Procedures   Microalbumin / creatinine urine ratio   Lipid panel   Ambulatory referral to Gastroenterology   Ambulatory referral to Cardiology   POCT glycosylated hemoglobin (Hb A1C)  Requested Prescriptions   Signed Prescriptions Disp Refills   amLODipine (NORVASC) 10 MG tablet 90 tablet 0    Sig: Take 1 tablet (10 mg total) by mouth daily.   cloNIDine (CATAPRES) 0.2 MG tablet 270 tablet 0    Sig: Take 1 tablet (0.2 mg total) by mouth 3 (three) times daily.   insulin glargine (LANTUS) 100 UNIT/ML injection 15 mL 0    Sig: Inject 0.15 mLs (15 Units total) into the skin daily.   insulin lispro (HUMALOG) 200 UNIT/ML KwikPen 15 mL 0    Sig: Units to give if BG < 70: Follow hypoglycemia orders. Inject 0-5 units under the skin 3 (three) times a day before meals. PER SLIDING SCALE Units to give for BG 70-140: 0 units Units to give for BG 141-190: 1 unit Units to give for BG 191-240: 2 units Units to give for BG 241-290: 3 units Units to give for BG 291-340: 4 units Units to give for BG 341-390: 5 units Units to give for BG > 390 6 units BG > 391 call PCP.   Insulin Pen Needle (PEN NEEDLES 3/16") 31G X 5 MM MISC 100 each 2    Sig: 1 each by Other route in the morning and at bedtime. Use with insulin pen once daily.   simvastatin (ZOCOR) 20 MG tablet 90 tablet 0    Sig: Take 1 tablet (20 mg total) by mouth at bedtime.   metoprolol tartrate (LOPRESSOR) 25 MG tablet 270 tablet 0    Sig: Take 1 tablet (25 mg total) by mouth in the morning, at noon, and at bedtime.    Return in about 2 weeks (around 02/06/2023) for Follow-Up or next available blood pressure  check.  Rema Fendt, NP

## 2023-01-24 ENCOUNTER — Other Ambulatory Visit: Payer: Self-pay | Admitting: Family

## 2023-01-24 DIAGNOSIS — E785 Hyperlipidemia, unspecified: Secondary | ICD-10-CM

## 2023-01-24 LAB — LIPID PANEL
Chol/HDL Ratio: 5.2 {ratio} — ABNORMAL HIGH (ref 0.0–5.0)
Cholesterol, Total: 161 mg/dL (ref 100–199)
HDL: 31 mg/dL — ABNORMAL LOW (ref >39)
LDL Chol Calc (NIH): 78 mg/dL (ref 0–99)
Triglycerides: 322 mg/dL — ABNORMAL HIGH (ref 0–149)
VLDL Cholesterol Cal: 52 mg/dL — ABNORMAL HIGH (ref 5–40)

## 2023-01-24 MED ORDER — SIMVASTATIN 40 MG PO TABS: 40.0000 mg | ORAL_TABLET | Freq: Every day | ORAL | 0 refills | Status: DC

## 2023-02-06 ENCOUNTER — Ambulatory Visit (INDEPENDENT_AMBULATORY_CARE_PROVIDER_SITE_OTHER): Payer: MEDICAID | Admitting: Family

## 2023-02-06 ENCOUNTER — Telehealth: Payer: Self-pay | Admitting: Family

## 2023-02-06 VITALS — BP 116/77 | HR 85 | Temp 97.9°F | Resp 16 | Ht 70.0 in | Wt 171.8 lb

## 2023-02-06 DIAGNOSIS — F411 Generalized anxiety disorder: Secondary | ICD-10-CM

## 2023-02-06 DIAGNOSIS — I10 Essential (primary) hypertension: Secondary | ICD-10-CM | POA: Diagnosis not present

## 2023-02-06 MED ORDER — BUSPIRONE HCL 10 MG PO TABS: 10.0000 mg | ORAL_TABLET | Freq: Two times a day (BID) | ORAL | 2 refills | Status: DC

## 2023-02-06 MED ORDER — NOVOLOG FLEXPEN 100 UNIT/ML ~~LOC~~ SOPN: PEN_INJECTOR | SUBCUTANEOUS | 11 refills | Status: DC

## 2023-02-06 MED ORDER — SERTRALINE HCL 25 MG PO TABS: 25.0000 mg | ORAL_TABLET | Freq: Every day | ORAL | 2 refills | Status: DC

## 2023-02-06 NOTE — Progress Notes (Signed)
Patient is here for 2wk follow-up BP Patent has uncontrolled hypertension Provider is  aware of readings  

## 2023-02-06 NOTE — Progress Notes (Signed)
Patient ID: Peter [name], male    DOB: 13-Aug-1980  MRN: [id number]  CC: Blood Pressure Check   Subjective: Peter [name] is a 42 y.o. male who presents for blood pressure check.   His concerns today include:  - Doing well on Amlodipine, Clonidine, and Metoprolol Tartrate, no issues/concerns. He does not complain of red flag symptoms such as but not limited to chest pain, shortness of breath, worst headache of life, nausea/vomiting.  - States health insurance does not cover Insulin Lispro. States pharmacy asking for $400 out of pocket. I sent message to pharmacy for advisement of alternative.  - Doing well on Zoloft and Buspirone for anxiety, no issues/concerns. States AA meetings help. He denies thoughts of self-harm, suicidal ideations, homicidal ideations.  Patient Active Problem List   Diagnosis Date Noted   Anticoagulant long-term use 10/24/2022   HLD (hyperlipidemia) 10/24/2022   Acute thrombosis of splenic vein 09/21/2022   Pancreatic mass 09/13/2022   Hepatitis 09/12/2022   Malignant hypertension 10/29/2020   Uncontrolled type 2 diabetes mellitus with hyperglycemia, without long-term current use of insulin (HCC) 10/29/2020   Alcohol withdrawal delirium, acute, hyperactive (HCC) 10/29/2020   Type 2 diabetes mellitus, without long-term current use of insulin (HCC) 07/05/2020   Lactic acidosis 04/04/2020   DKA (diabetic ketoacidosis) (HCC) 04/04/2020   Pancreatitis, acute 04/04/2020   High anion gap metabolic acidosis    Generalized anxiety disorder 01/29/2019   Depression 01/29/2019   MDD (major depressive disorder), recurrent episode, moderate (HCC) 10/13/2018   Right tibial fracture 01/21/2018   Chest pain 05/20/2017   Anxiety 04/11/2017   Current mild episode of major depressive disorder (HCC) 04/11/2017   Hypomagnesemia 02/15/2017   Abdominal pain 02/14/2017   Acute pancreatitis 02/14/2017   Alcoholic hepatitis without ascites 02/14/2017   Hypokalemia  12/03/2016   Leukocytosis 12/03/2016   Alcohol withdrawal (HCC) 12/02/2016   Alcohol dependence with other alcohol-induced disorder (HCC) 11/18/2016   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   Essential hypertension 11/18/2016   Elevated liver enzymes 11/14/2016     Current Outpatient Medications on File Prior to Visit  Medication Sig Dispense Refill   amLODipine (NORVASC) 10 MG tablet Take 1 tablet (10 mg total) by mouth daily. 90 tablet 0   cloNIDine (CATAPRES) 0.2 MG tablet Take 1 tablet (0.2 mg total) by mouth 3 (three) times daily. 270 tablet 0   insulin glargine (LANTUS) 100 UNIT/ML injection Inject 0.15 mLs (15 Units total) into the skin daily. 15 mL 0   insulin lispro (HUMALOG) 200 UNIT/ML KwikPen Units to give if BG < 70: Follow hypoglycemia orders. Inject 0-5 units under the skin 3 (three) times a day before meals. PER SLIDING SCALE Units to give for BG 70-140: 0 units Units to give for BG 141-190: 1 unit Units to give for BG 191-240: 2 units Units to give for BG 241-290: 3 units Units to give for BG 291-340: 4 units Units to give for BG 341-390: 5 units Units to give for BG > 390 6 units BG > 391 call PCP. 15 mL 0   Insulin Pen Needle (PEN NEEDLES 3/16") 31G X 5 MM MISC 1 each by Other route in the morning and at bedtime. Use with insulin pen once daily. 100 each 2   LORazepam (ATIVAN) 1 MG tablet Take 1 tablet (1 mg total) by mouth 3 (three) times daily as needed for anxiety. 15 tablet 0   metFORMIN (GLUCOPHAGE) 1000 MG tablet Take 1 tablet (1,000 mg total) by mouth  2 (two) times daily with a meal. 180 tablet 0   metoprolol tartrate (LOPRESSOR) 25 MG tablet Take 1 tablet (25 mg total) by mouth in the morning, at noon, and at bedtime. 270 tablet 0   simvastatin (ZOCOR) 40 MG tablet Take 1 tablet (40 mg total) by mouth at bedtime. 90 tablet 0   venlafaxine XR (EFFEXOR-XR) 75 MG 24 hr capsule Take 1 capsule (75 mg total) by mouth daily with breakfast. 30 capsule 0   No current facility-administered  medications on file prior to visit.    No Known Allergies  Social History   Socioeconomic History   Marital status: Single    Spouse name: Not on file   Number of children: Not on file   Years of education: Not on file   Highest education level: Not on file  Occupational History   Not on file  Tobacco Use   Smoking status: Every Day    Current packs/day: 0.50    Types: Cigarettes   Smokeless tobacco: Never  Vaping Use   Vaping status: Never Used  Substance and Sexual Activity   Alcohol use: Yes   Drug use: No   Sexual activity: Yes    Birth control/protection: Condom  Other Topics Concern   Not on file  Social History Narrative   Not on file   Social Drivers of Health   Financial Resource Strain: Low Risk  (02/06/2023)   Overall Financial Resource Strain (CARDIA)    Difficulty of Paying Living Expenses: Not very hard  Food Insecurity: No Food Insecurity (02/06/2023)   Hunger Vital Sign    Worried About Running Out of Food in the Last Year: Never true    Ran Out of Food in the Last Year: Never true  Transportation Needs: No Transportation Needs (02/06/2023)   PRAPARE - Administrator, Civil Service (Medical): No    Lack of Transportation (Non-Medical): No  Physical Activity: Insufficiently Active (02/06/2023)   Exercise Vital Sign    Days of Exercise per Week: 3 days    Minutes of Exercise per Session: 30 min  Stress: No Stress Concern Present (02/06/2023)   [employer] of Occupational Health - Occupational Stress Questionnaire    Feeling of Stress : Not at all  Social Connections: Socially Isolated (02/06/2023)   Social Connection and Isolation Panel [NHANES]    Frequency of Communication with Friends and Family: Never    Frequency of Social Gatherings with Friends and Family: Never    Attends Religious Services: Never    Database administrator or Organizations: No    Attends Banker Meetings: Never    Marital Status: Living  with partner  Intimate Partner Violence: Not At Risk (02/06/2023)   Humiliation, Afraid, Rape, and Kick questionnaire    Fear of Current or Ex-Partner: No    Emotionally Abused: No    Physically Abused: No    Sexually Abused: No    Family History  Problem Relation Age of Onset   Hypertension Father    Stroke Father    CAD Father    Cancer Paternal Grandfather        Lung   Hyperlipidemia Paternal Grandfather    Hypertension Paternal Grandfather     Past Surgical History:  Procedure Laterality Date   DENTAL SURGERY     ESOPHAGOGASTRODUODENOSCOPY (EGD) WITH PROPOFOL N/A 09/15/2022   Procedure: ESOPHAGOGASTRODUODENOSCOPY (EGD) WITH PROPOFOL;  Surgeon: Willis Modena, MD;  Location: [facility];  Service: Gastroenterology;  Laterality:  N/A;   EUS Left 09/15/2022   Procedure: UPPER ENDOSCOPIC ULTRASOUND (EUS) LINEAR;  Surgeon: Willis Modena, MD;  Location: [facility];  Service: Gastroenterology;  Laterality: Left;   FINE NEEDLE ASPIRATION N/A 09/15/2022   Procedure: FINE NEEDLE ASPIRATION (FNA) LINEAR;  Surgeon: Willis Modena, MD;  Location: [facility];  Service: Gastroenterology;  Laterality: N/A;   TIBIA IM NAIL INSERTION Right 01/22/2018   Procedure: RIGHT INTRAMEDULLARY (IM) NAIL TIBIAL;  Surgeon: Sheral Apley, MD;  Location: [facility];  Service: Orthopedics;  Laterality: Right;    ROS: Review of Systems Negative except as stated above  PHYSICAL EXAM: BP 116/77   Pulse 85   Temp 97.9 F (36.6 C) (Oral)   Resp 16   Ht 5\' 10"  (1.778 m)   Wt 171 lb 12.8 oz (77.9 kg)   SpO2 97%   BMI 24.65 kg/m   Physical Exam HENT:     Head: Normocephalic and atraumatic.     Nose: Nose normal.     Mouth/Throat:     Mouth: Mucous membranes are moist.     Pharynx: Oropharynx is clear.  Eyes:     Extraocular Movements: Extraocular movements intact.     Conjunctiva/sclera: Conjunctivae normal.     Pupils: Pupils are equal, round, and reactive to light.  Cardiovascular:      Rate and Rhythm: Normal rate and regular rhythm.     Pulses: Normal pulses.     Heart sounds: Normal heart sounds.  Pulmonary:     Effort: Pulmonary effort is normal.     Breath sounds: Normal breath sounds.  Musculoskeletal:        General: Normal range of motion.     Cervical back: Normal range of motion and neck supple.  Neurological:     General: No focal deficit present.     Mental Status: He is alert and oriented to person, place, and time.  Psychiatric:        Mood and Affect: Mood normal.        Behavior: Behavior normal.     ASSESSMENT AND PLAN: 1. Primary hypertension (Primary) - Continue Amlodipine, Clonidine, and Metoprolol Tartrate as prescribed. No refills needed as of present. - Counseled on blood pressure goal of less than 130/80, low-sodium, DASH diet, medication compliance, and 150 minutes of moderate intensity exercise per week as tolerated. Counseled on medication adherence and adverse effects. - Keep all scheduled appointments with Cardiology.  - Follow-up with primary provider as scheduled.   2. Generalized anxiety disorder - Patient denies thoughts of self-harm, suicidal ideations, homicidal ideations. - Continue Buspirone and Sertraline as prescribed. Counseled on medication adherence/adverse effects.  - Follow-up with primary provider in 3 months or sooner if needed.  - busPIRone (BUSPAR) 10 MG tablet; Take 1 tablet (10 mg total) by mouth 2 (two) times daily.  Dispense: 60 tablet; Refill: 2 - sertraline (ZOLOFT) 25 MG tablet; Take 1 tablet (25 mg total) by mouth daily.  Dispense: 30 tablet; Refill: 2    Patient was given the opportunity to ask questions.  Patient verbalized understanding of the plan and was able to repeat key elements of the plan. Patient was given clear instructions to go to Emergency Department or return to medical center if symptoms don't improve, worsen, or new problems develop.The patient verbalized understanding.   Requested  Prescriptions   Signed Prescriptions Disp Refills   busPIRone (BUSPAR) 10 MG tablet 60 tablet 2    Sig: Take 1 tablet (10 mg total) by mouth  2 (two) times daily.   sertraline (ZOLOFT) 25 MG tablet 30 tablet 2    Sig: Take 1 tablet (25 mg total) by mouth daily.    Follow-up with primary provider as scheduled.   Rema Fendt, NP

## 2023-02-06 NOTE — Telephone Encounter (Signed)
Yes ma'am. Current Humalog rxn on file is for u200 instead of u100. U200 is seldomly covered. With that being said, it looks like insurance prefers Novolog instead. I sent Novolog to the patient's pharmacy.

## 2023-02-06 NOTE — Patient Instructions (Signed)
Please call [facility] Address: [address] [place], [place] [address]  Phone #: [phone]  [phone]

## 2023-02-07 ENCOUNTER — Telehealth: Payer: Self-pay

## 2023-02-07 ENCOUNTER — Other Ambulatory Visit: Payer: Self-pay

## 2023-02-07 NOTE — Telephone Encounter (Signed)
Thank you :)

## 2023-02-07 NOTE — Telephone Encounter (Signed)
Good morning Peter [name], can you give me a hand with this prior auth please.  Humalog Kwikpen 200Unit/ML Inj  Thank you.

## 2023-02-12 ENCOUNTER — Other Ambulatory Visit: Payer: Self-pay

## 2023-04-18 ENCOUNTER — Telehealth: Payer: Self-pay

## 2023-04-18 NOTE — Transitions of Care (Post Inpatient/ED Visit) (Signed)
   04/18/2023  Name: Peter [name] MRN: [id number] DOB: [birth year]  Today's TOC FU Call Status: Today's TOC FU Call Status:: Unsuccessful Call (1st Attempt) Unsuccessful Call (1st Attempt) Date: 04/18/23  Attempted to reach the patient regarding the most recent Inpatient/ED visit.  Follow Up Plan: Additional outreach attempts will be made to reach the patient to complete the Transitions of Care (Post Inpatient/ED visit) call.   Aissata Wilmore A. Mliss Fritz RN, BA, [facility], CRRN [facility] RN Care Manager, Transition of Care [phone]

## 2023-04-19 ENCOUNTER — Telehealth: Payer: Self-pay

## 2023-04-19 NOTE — Transitions of Care (Post Inpatient/ED Visit) (Signed)
   04/19/2023  Name: Peter [name] MRN: [id number] DOB: 09/25/1980  Today's TOC FU Call Status: Today's TOC FU Call Status:: Unsuccessful Call (2nd Attempt) Unsuccessful Call (2nd Attempt) Date: 04/19/23  Attempted to reach the patient regarding the most recent Inpatient/ED visit.  Follow Up Plan: Additional outreach attempts will be made to reach the patient to complete the Transitions of Care (Post Inpatient/ED visit) call.   Camryn Quesinberry A. Mliss Fritz RN, BA, [facility], CRRN [facility] RN Care Manager, Transition of Care [phone]

## 2023-04-20 ENCOUNTER — Telehealth: Payer: Self-pay

## 2023-04-20 NOTE — Transitions of Care (Post Inpatient/ED Visit) (Signed)
   04/20/2023  Name: JASH WAHLEN MRN: [id number] DOB: [birth year]  Today's TOC FU Call Status: Today's TOC FU Call Status:: Unsuccessful Call (3rd Attempt) Unsuccessful Call (3rd Attempt) Date: 04/20/23  Attempted to reach the patient regarding the most recent Inpatient/ED visit.  Follow Up Plan: No further outreach attempts will be made at this time. We have been unable to contact the patient.  Ellory Khurana A. Mliss Fritz RN, BA, [facility], CRRN [facility] RN Care Manager, Transition of Care [phone]

## 2023-04-26 ENCOUNTER — Other Ambulatory Visit: Payer: Self-pay | Admitting: Family

## 2023-04-26 DIAGNOSIS — I10 Essential (primary) hypertension: Secondary | ICD-10-CM

## 2023-04-26 DIAGNOSIS — R000 Tachycardia, unspecified: Secondary | ICD-10-CM

## 2023-04-27 ENCOUNTER — Other Ambulatory Visit: Payer: Self-pay | Admitting: Family

## 2023-04-27 DIAGNOSIS — Z794 Long term (current) use of insulin: Secondary | ICD-10-CM

## 2023-04-27 DIAGNOSIS — E1165 Type 2 diabetes mellitus with hyperglycemia: Secondary | ICD-10-CM

## 2023-05-03 ENCOUNTER — Other Ambulatory Visit: Payer: Self-pay | Admitting: Family

## 2023-05-03 DIAGNOSIS — E1165 Type 2 diabetes mellitus with hyperglycemia: Secondary | ICD-10-CM

## 2023-05-03 NOTE — Telephone Encounter (Signed)
 Copied from CRM [id number]. Topic: Clinical - Medication Refill >> May 03, 2023 12:36 PM Higinio Roger wrote: Most Recent Primary Care Visit:  Provider: Ricky Stabs J  Department: [facility]  Visit Type: OFFICE VISIT  Date: 02/06/2023  Medication: insulin glargine (LANTUS) 100 UNIT/ML injection   Has the patient contacted their pharmacy? Yes (Agent: If no, request that the patient contact the pharmacy for the refill. If patient does not wish to contact the pharmacy document the reason why and proceed with request.) (Agent: If yes, when and what did the pharmacy advise?) Needs prior authorization  Is this the correct pharmacy for this prescription? Yes If no, delete pharmacy and type the correct one.  This is the patient's preferred pharmacy:   [facility] [address] [place] - [address] [place] [address] Phone: [phone] Fax: [phone]   Has the prescription been filled recently? Yes  Is the patient out of the medication? Yes  Has the patient been seen for an appointment in the last year OR does the patient have an upcoming appointment? Yes  Can we respond through MyChart? Yes  Agent: Please be advised that Rx refills may take up to 3 business days. We ask that you follow-up with your pharmacy.

## 2023-05-03 NOTE — Telephone Encounter (Signed)
 Complete. Schedule appointment.

## 2023-05-04 MED ORDER — INSULIN GLARGINE 100 UNIT/ML ~~LOC~~ SOLN: 15.0000 [IU] | Freq: Every day | SUBCUTANEOUS | 0 refills | Status: DC

## 2023-05-04 NOTE — Telephone Encounter (Signed)
 Different pharmacy request. Requested Prescriptions  Pending Prescriptions Disp Refills   insulin glargine (LANTUS) 100 UNIT/ML injection 15 mL 0    Sig: Inject 0.15 mLs (15 Units total) into the skin daily.     Endocrinology:  Diabetes - Insulins Failed - 05/04/2023 10:25 AM      Failed - HBA1C is between 0 and 7.9 and within 180 days    Hgb A1c MFr Bld  Date Value Ref Range Status  09/12/2022 8.9 (H) 4.8 - 5.6 % Final    Comment:    (NOTE) Pre diabetes:          5.7%-6.4%  Diabetes:              >6.4%  Glycemic control for   <7.0% adults with diabetes          Passed - Valid encounter within last 6 months    Recent Outpatient Visits           2 months ago Primary hypertension   [facility], Amy J, NP   3 months ago Primary hypertension   [facility], Amy J, NP   5 months ago Encounter to establish care   [facility], Salomon Fick, NP       Future Appointments             In 3 days Rema Fendt, NP [facility]

## 2023-05-07 ENCOUNTER — Encounter: Payer: Self-pay | Admitting: Family

## 2023-05-07 ENCOUNTER — Ambulatory Visit (INDEPENDENT_AMBULATORY_CARE_PROVIDER_SITE_OTHER): Payer: MEDICAID | Admitting: Family

## 2023-05-07 VITALS — BP 126/87 | HR 101 | Temp 98.7°F | Ht 70.0 in | Wt 167.6 lb

## 2023-05-07 DIAGNOSIS — E1165 Type 2 diabetes mellitus with hyperglycemia: Secondary | ICD-10-CM

## 2023-05-07 DIAGNOSIS — F32A Depression, unspecified: Secondary | ICD-10-CM

## 2023-05-07 DIAGNOSIS — K8689 Other specified diseases of pancreas: Secondary | ICD-10-CM

## 2023-05-07 DIAGNOSIS — I864 Gastric varices: Secondary | ICD-10-CM

## 2023-05-07 DIAGNOSIS — I10 Essential (primary) hypertension: Secondary | ICD-10-CM

## 2023-05-07 DIAGNOSIS — K2980 Duodenitis without bleeding: Secondary | ICD-10-CM

## 2023-05-07 DIAGNOSIS — Z794 Long term (current) use of insulin: Secondary | ICD-10-CM | POA: Diagnosis not present

## 2023-05-07 DIAGNOSIS — R000 Tachycardia, unspecified: Secondary | ICD-10-CM

## 2023-05-07 DIAGNOSIS — Z09 Encounter for follow-up examination after completed treatment for conditions other than malignant neoplasm: Secondary | ICD-10-CM

## 2023-05-07 DIAGNOSIS — K209 Esophagitis, unspecified without bleeding: Secondary | ICD-10-CM

## 2023-05-07 DIAGNOSIS — E119 Type 2 diabetes mellitus without complications: Secondary | ICD-10-CM

## 2023-05-07 DIAGNOSIS — R9389 Abnormal findings on diagnostic imaging of other specified body structures: Secondary | ICD-10-CM

## 2023-05-07 DIAGNOSIS — F419 Anxiety disorder, unspecified: Secondary | ICD-10-CM

## 2023-05-07 DIAGNOSIS — E785 Hyperlipidemia, unspecified: Secondary | ICD-10-CM

## 2023-05-07 LAB — POCT GLYCOSYLATED HEMOGLOBIN (HGB A1C): HbA1c, POC (controlled diabetic range): 12.2 % — AB (ref 0.0–7.0)

## 2023-05-07 MED ORDER — CLONIDINE HCL 0.2 MG PO TABS: 0.2000 mg | ORAL_TABLET | Freq: Three times a day (TID) | ORAL | 0 refills | Status: DC

## 2023-05-07 MED ORDER — SERTRALINE HCL 25 MG PO TABS: 25.0000 mg | ORAL_TABLET | Freq: Every day | ORAL | 0 refills | Status: DC

## 2023-05-07 MED ORDER — NOVOLOG FLEXPEN 100 UNIT/ML ~~LOC~~ SOPN
PEN_INJECTOR | SUBCUTANEOUS | 11 refills | Status: DC
Start: 2023-05-07 — End: 2023-07-24

## 2023-05-07 MED ORDER — METOPROLOL TARTRATE 25 MG PO TABS: 25.0000 mg | ORAL_TABLET | Freq: Three times a day (TID) | ORAL | 0 refills | Status: DC

## 2023-05-07 MED ORDER — SIMVASTATIN 40 MG PO TABS: 40.0000 mg | ORAL_TABLET | Freq: Every day | ORAL | 0 refills | Status: DC

## 2023-05-07 MED ORDER — LANTUS SOLOSTAR 100 UNIT/ML ~~LOC~~ SOPN: 30.0000 [IU] | PEN_INJECTOR | Freq: Every day | SUBCUTANEOUS | 0 refills | Status: DC

## 2023-05-07 MED ORDER — BUSPIRONE HCL 10 MG PO TABS: 10.0000 mg | ORAL_TABLET | Freq: Two times a day (BID) | ORAL | 2 refills | Status: DC

## 2023-05-07 NOTE — Progress Notes (Signed)
 ASPatient states he has questions.  Patient needs his zoloft refilled.

## 2023-05-07 NOTE — Progress Notes (Signed)
 Patient ID: Peter [name], male    DOB: 1980-03-24  MRN: [id number]  CC: Hospital Discharge Follow-Up  Subjective: Peter [name] is a 43 y.o. male who presents for hospital discharge follow-up.   His concerns today include:  - Feeling improved since recent hospital discharge. Denies red flag symptoms. Doing well on medication regimen, no issues/concerns. - Doing well on Clonidine and Metoprolol Tartrate, no issues/concerns. States since previous office visit he did not follow-up with Cardiology. He does not complain of red flag symptoms such as but not limited to chest pain, shortness of breath, worst headache of life, nausea/vomiting.  - Doing well on Insulin Glargine and Insulin Aspart, no issues/concerns. Patient states during recent hospital discharge Insulin Glargine was increased from 15 units daily to 25 units daily. He denies red flag symptoms associated with diabetes.  - Doing well on Simvastatin, no issues/concerns.  - Doing well on Sertraline and Buspirone, no issues/concerns. He denies thoughts of self-harm, suicidal ideations, homicidal ideations. - During recent hospital discharge patient was instructed to follow-up with Gastroenterology for duodenitis, esophagitis, pancreatic mass, and gastric varix. He denies red flag symptoms.  - No further issues/concerns for discussion today.   Patient Active Problem List   Diagnosis Date Noted   Anticoagulant long-term use 10/24/2022   HLD (hyperlipidemia) 10/24/2022   Acute thrombosis of splenic vein 09/21/2022   Pancreatic mass 09/13/2022   Hepatitis 09/12/2022   Malignant hypertension 10/29/2020   Uncontrolled type 2 diabetes mellitus with hyperglycemia, without long-term current use of insulin (HCC) 10/29/2020   Alcohol withdrawal delirium, acute, hyperactive (HCC) 10/29/2020   Type 2 diabetes mellitus, without long-term current use of insulin (HCC) 07/05/2020   Lactic acidosis 04/04/2020   DKA (diabetic ketoacidosis)  (HCC) 04/04/2020   Pancreatitis, acute 04/04/2020   High anion gap metabolic acidosis    Generalized anxiety disorder 01/29/2019   Depression 01/29/2019   MDD (major depressive disorder), recurrent episode, moderate (HCC) 10/13/2018   Right tibial fracture 01/21/2018   Chest pain 05/20/2017   Anxiety 04/11/2017   Current mild episode of major depressive disorder (HCC) 04/11/2017   Hypomagnesemia 02/15/2017   Abdominal pain 02/14/2017   Acute pancreatitis 02/14/2017   Alcoholic hepatitis without ascites 02/14/2017   Hypokalemia 12/03/2016   Leukocytosis 12/03/2016   Alcohol withdrawal (HCC) 12/02/2016   Alcohol dependence with other alcohol-induced disorder (HCC) 11/18/2016   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   Essential hypertension 11/18/2016   Elevated liver enzymes 11/14/2016     Current Outpatient Medications on File Prior to Visit  Medication Sig Dispense Refill   insulin glargine (LANTUS) 100 UNIT/ML injection Inject 0.15 mLs (15 Units total) into the skin daily. (Patient taking differently: Inject 25 Units into the skin daily.) 15 mL 0   Insulin Pen Needle (PEN NEEDLES 3/16") 31G X 5 MM MISC 1 each by Other route in the morning and at bedtime. Use with insulin pen once daily. 100 each 2   amLODipine (NORVASC) 10 MG tablet Take 1 tablet (10 mg total) by mouth daily. 90 tablet 0   LORazepam (ATIVAN) 1 MG tablet Take 1 tablet (1 mg total) by mouth 3 (three) times daily as needed for anxiety. (Patient not taking: Reported on 05/07/2023) 15 tablet 0   metFORMIN (GLUCOPHAGE) 1000 MG tablet Take 1 tablet (1,000 mg total) by mouth 2 (two) times daily with a meal. 180 tablet 0   venlafaxine XR (EFFEXOR-XR) 75 MG 24 hr capsule Take 1 capsule (75 mg total) by mouth daily with breakfast.  30 capsule 0   No current facility-administered medications on file prior to visit.    No Known Allergies  Social History   Socioeconomic History   Marital status: Single    Spouse name: Not on file    Number of children: Not on file   Years of education: Not on file   Highest education level: Not on file  Occupational History   Not on file  Tobacco Use   Smoking status: Every Day    Current packs/day: 0.50    Types: Cigarettes   Smokeless tobacco: Never  Vaping Use   Vaping status: Never Used  Substance and Sexual Activity   Alcohol use: Yes   Drug use: No   Sexual activity: Yes    Birth control/protection: Condom  Other Topics Concern   Not on file  Social History Narrative   Not on file   Social Drivers of Health   Financial Resource Strain: Low Risk  (02/06/2023)   Overall Financial Resource Strain (CARDIA)    Difficulty of Paying Living Expenses: Not very hard  Food Insecurity: Low Risk  (04/15/2023)   Received from [facility]   Hunger Vital Sign    Worried About Running Out of Food in the Last Year: Never true    Ran Out of Food in the Last Year: Never true  Transportation Needs: Not on file (04/15/2023)  Physical Activity: Insufficiently Active (02/06/2023)   Exercise Vital Sign    Days of Exercise per Week: 3 days    Minutes of Exercise per Session: 30 min  Stress: No Stress Concern Present (02/06/2023)   [employer] of Occupational Health - Occupational Stress Questionnaire    Feeling of Stress : Not at all  Social Connections: Socially Isolated (02/06/2023)   Social Connection and Isolation Panel [NHANES]    Frequency of Communication with Friends and Family: Never    Frequency of Social Gatherings with Friends and Family: Never    Attends Religious Services: Never    Database administrator or Organizations: No    Attends Banker Meetings: Never    Marital Status: Living with partner  Intimate Partner Violence: Not At Risk (02/06/2023)   Humiliation, Afraid, Rape, and Kick questionnaire    Fear of Current or Ex-Partner: No    Emotionally Abused: No    Physically Abused: No    Sexually Abused: No    Family History  Problem  Relation Age of Onset   Hypertension Father    Stroke Father    CAD Father    Cancer Paternal Grandfather        Lung   Hyperlipidemia Paternal Grandfather    Hypertension Paternal Grandfather     Past Surgical History:  Procedure Laterality Date   DENTAL SURGERY     ESOPHAGOGASTRODUODENOSCOPY (EGD) WITH PROPOFOL N/A 09/15/2022   Procedure: ESOPHAGOGASTRODUODENOSCOPY (EGD) WITH PROPOFOL;  Surgeon: Willis Modena, MD;  Location: [facility];  Service: Gastroenterology;  Laterality: N/A;   EUS Left 09/15/2022   Procedure: UPPER ENDOSCOPIC ULTRASOUND (EUS) LINEAR;  Surgeon: Willis Modena, MD;  Location: [facility];  Service: Gastroenterology;  Laterality: Left;   FINE NEEDLE ASPIRATION N/A 09/15/2022   Procedure: FINE NEEDLE ASPIRATION (FNA) LINEAR;  Surgeon: Willis Modena, MD;  Location: [facility];  Service: Gastroenterology;  Laterality: N/A;   TIBIA IM NAIL INSERTION Right 01/22/2018   Procedure: RIGHT INTRAMEDULLARY (IM) NAIL TIBIAL;  Surgeon: Sheral Apley, MD;  Location: [facility] OR;  Service: Orthopedics;  Laterality: Right;  ROS: Review of Systems Negative except as stated above  PHYSICAL EXAM: BP 126/87   Pulse (!) 101   Temp 98.7 F (37.1 C) (Oral)   Ht 5\' 10"  (1.778 m)   Wt 167 lb 9.6 oz (76 kg)   SpO2 95%   BMI 24.05 kg/m   Physical Exam HENT:     Head: Normocephalic and atraumatic.     Nose: Nose normal.     Mouth/Throat:     Mouth: Mucous membranes are moist.     Pharynx: Oropharynx is clear.  Eyes:     Extraocular Movements: Extraocular movements intact.     Conjunctiva/sclera: Conjunctivae normal.     Pupils: Pupils are equal, round, and reactive to light.  Cardiovascular:     Rate and Rhythm: Tachycardia present.     Pulses: Normal pulses.     Heart sounds: Normal heart sounds.  Pulmonary:     Effort: Pulmonary effort is normal.     Breath sounds: Normal breath sounds.  Abdominal:     General: Bowel sounds are normal.     Palpations:  Abdomen is soft.  Musculoskeletal:        General: Normal range of motion.     Cervical back: Normal range of motion and neck supple.  Neurological:     General: No focal deficit present.     Mental Status: He is alert and oriented to person, place, and time.  Psychiatric:        Mood and Affect: Mood normal.        Behavior: Behavior normal.    Results for orders placed or performed in visit on 05/07/23  POCT glycosylated hemoglobin (Hb A1C)  Result Value Ref Range   Hemoglobin A1C     HbA1c POC (<> result, manual entry)     HbA1c, POC (prediabetic range)     HbA1c, POC (controlled diabetic range) 12.2 (A) 0.0 - 7.0 %    ASSESSMENT AND PLAN: 1. Hospital discharge follow-up (Primary) - Reviewed hospital course, current medications, ensured proper follow-up in place, and addressed concerns.   2. Primary hypertension 3. Tachycardia - Continue Clonidine and Metoprolol Tartrate as prescribed.  - Routine screening.  - Counseled on blood pressure goal of less than 130/80, low-sodium, DASH diet, medication compliance, and 150 minutes of moderate intensity exercise per week as tolerated. Counseled on medication adherence and adverse effects. - Referral to Cardiology for evaluation/management.  - Follow-up with primary provider as scheduled. - cloNIDine (CATAPRES) 0.2 MG tablet; Take 1 tablet (0.2 mg total) by mouth 3 (three) times daily.  Dispense: 270 tablet; Refill: 0 - metoprolol tartrate (LOPRESSOR) 25 MG tablet; Take 1 tablet (25 mg total) by mouth in the morning, at noon, and at bedtime.  Dispense: 270 tablet; Refill: 0 - Basic Metabolic Panel - Ambulatory referral to Cardiology  4. Type 2 diabetes mellitus with hyperglycemia, with long-term current use of insulin (HCC) - Hemoglobin A1c not at goal at 12.2%, goal 7%. - Increase Insulin Glargine from 25 units daily to 30 units daily as prescribed.  - Continue Insulin Aspart as prescribed.  - Routine screening.  - Discussed the  importance of healthy eating habits, low-carbohydrate diet, low-sugar diet, regular aerobic exercise (at least 150 minutes a week as tolerated) and medication compliance to achieve or maintain control of diabetes. Counseled on medication adherence/adverse effects.  - Referral to Endocrinology for evaluation/management. - Follow-up with primary provider in 4 weeks or sooner if needed.  - POCT glycosylated hemoglobin (Hb  A1C) - insulin aspart (NOVOLOG FLEXPEN) 100 UNIT/ML FlexPen; Units to give if BG < 70: Follow hypoglycemia orders. Inject 0-5 units under the skin 3 (three) times a day before meals. PER SLIDING SCALE Units to give for BG 70-140: 0 units Units to give for BG 141-190: 1 unit Units to give for BG 191-240: 2 units Units to give for BG 241-290: 3 units Units to give for BG 291-340: 4 units Units to give for BG 341-390: 5 units Units to give for BG > 390 6 units BG > 391 call PCP.  Dispense: 15 mL; Refill: 11 - insulin glargine (LANTUS SOLOSTAR) 100 UNIT/ML Solostar Pen; Inject 30 Units into the skin at bedtime.  Dispense: 15 mL; Refill: 0 - Ambulatory referral to Endocrinology - Microalbumin / creatinine urine ratio  5. Diabetic eye exam [facility]) - Referral to Ophthalmology for evaluation/management. - Ambulatory referral to Ophthalmology  6. Encounter for diabetic foot exam [facility]) - Referral to Podiatry for evaluation/management. - Ambulatory referral to Podiatry  7. Hyperlipidemia, unspecified hyperlipidemia type - Continue Simvastatin as prescribed. Counseled on medication adherence/adverse effects.  - Routine screening.  - Follow-up with primary provider as scheduled. - simvastatin (ZOCOR) 40 MG tablet; Take 1 tablet (40 mg total) by mouth at bedtime.  Dispense: 90 tablet; Refill: 0 - Lipid panel  8. Anxiety and depression - Patient denies thoughts of self-harm, suicidal ideations, homicidal ideations. - Continue Sertraline and Buspirone as prescribed. Counseled on medication  adherence/adverse effects.  - Follow-up with primary provider in 3 months or sooner if needed. - busPIRone (BUSPAR) 10 MG tablet; Take 1 tablet (10 mg total) by mouth 2 (two) times daily.  Dispense: 60 tablet; Refill: 2 - sertraline (ZOLOFT) 25 MG tablet; Take 1 tablet (25 mg total) by mouth daily.  Dispense: 90 tablet; Refill: 0  9. Duodenitis 10. Esophagitis 11. Pancreatic mass 12. Gastric varix 13. Abnormal CT scan - Continue present management.  - Routine screening.  - Referral to Gastroenterology for evaluation/management. - Ambulatory referral to Gastroenterology - Magnesium   Patient was given the opportunity to ask questions.  Patient verbalized understanding of the plan and was able to repeat key elements of the plan. Patient was given clear instructions to go to Emergency Department or return to medical center if symptoms don't improve, worsen, or new problems develop.The patient verbalized understanding.   Orders Placed This Encounter  Procedures   Microalbumin / creatinine urine ratio   Basic Metabolic Panel   Lipid panel   Magnesium   Ambulatory referral to Endocrinology   Ambulatory referral to Podiatry   Ambulatory referral to Ophthalmology   Ambulatory referral to Cardiology   Ambulatory referral to Gastroenterology   POCT glycosylated hemoglobin (Hb A1C)     Requested Prescriptions   Signed Prescriptions Disp Refills   busPIRone (BUSPAR) 10 MG tablet 60 tablet 2    Sig: Take 1 tablet (10 mg total) by mouth 2 (two) times daily.   simvastatin (ZOCOR) 40 MG tablet 90 tablet 0    Sig: Take 1 tablet (40 mg total) by mouth at bedtime.   cloNIDine (CATAPRES) 0.2 MG tablet 270 tablet 0    Sig: Take 1 tablet (0.2 mg total) by mouth 3 (three) times daily.   insulin aspart (NOVOLOG FLEXPEN) 100 UNIT/ML FlexPen 15 mL 11    Sig: Units to give if BG < 70: Follow hypoglycemia orders. Inject 0-5 units under the skin 3 (three) times a day before meals. PER SLIDING SCALE  Units to  give for BG 70-140: 0 units Units to give for BG 141-190: 1 unit Units to give for BG 191-240: 2 units Units to give for BG 241-290: 3 units Units to give for BG 291-340: 4 units Units to give for BG 341-390: 5 units Units to give for BG > 390 6 units BG > 391 call PCP.   insulin glargine (LANTUS SOLOSTAR) 100 UNIT/ML Solostar Pen 15 mL 0    Sig: Inject 30 Units into the skin at bedtime.   sertraline (ZOLOFT) 25 MG tablet 90 tablet 0    Sig: Take 1 tablet (25 mg total) by mouth daily.   metoprolol tartrate (LOPRESSOR) 25 MG tablet 270 tablet 0    Sig: Take 1 tablet (25 mg total) by mouth in the morning, at noon, and at bedtime.    Follow-up with primary provider as scheduled.  Rema Fendt, NP

## 2023-05-08 ENCOUNTER — Other Ambulatory Visit: Payer: Self-pay | Admitting: Family

## 2023-05-08 ENCOUNTER — Encounter: Payer: Self-pay | Admitting: Family

## 2023-05-08 DIAGNOSIS — E785 Hyperlipidemia, unspecified: Secondary | ICD-10-CM

## 2023-05-08 LAB — BASIC METABOLIC PANEL
BUN/Creatinine Ratio: 18 (ref 9–20)
BUN: 19 mg/dL (ref 6–24)
CO2: 23 mmol/L (ref 20–29)
Calcium: 9.4 mg/dL (ref 8.7–10.2)
Chloride: 94 mmol/L — ABNORMAL LOW (ref 96–106)
Creatinine, Ser: 1.08 mg/dL (ref 0.76–1.27)
Glucose: 351 mg/dL — ABNORMAL HIGH (ref 70–99)
Potassium: 5.6 mmol/L — ABNORMAL HIGH (ref 3.5–5.2)
Sodium: 132 mmol/L — ABNORMAL LOW (ref 134–144)
eGFR: 88 mL/min/{1.73_m2} (ref >59)

## 2023-05-08 LAB — LIPID PANEL
Chol/HDL Ratio: 5.6 ratio — ABNORMAL HIGH (ref 0.0–5.0)
Cholesterol, Total: 163 mg/dL (ref 100–199)
HDL: 29 mg/dL — ABNORMAL LOW (ref >39)
LDL Chol Calc (NIH): 73 mg/dL (ref 0–99)
Triglycerides: 382 mg/dL — ABNORMAL HIGH (ref 0–149)
VLDL Cholesterol Cal: 61 mg/dL — ABNORMAL HIGH (ref 5–40)

## 2023-05-08 MED ORDER — SIMVASTATIN 80 MG PO TABS: 40.0000 mg | ORAL_TABLET | Freq: Every day | ORAL | 0 refills | Status: DC

## 2023-06-07 ENCOUNTER — Encounter: Payer: MEDICAID | Admitting: Family

## 2023-06-07 NOTE — Progress Notes (Signed)
 Erroneous encounter-disregard

## 2023-07-20 ENCOUNTER — Other Ambulatory Visit: Payer: Self-pay | Admitting: Family

## 2023-07-20 DIAGNOSIS — E1165 Type 2 diabetes mellitus with hyperglycemia: Secondary | ICD-10-CM

## 2023-07-20 NOTE — Telephone Encounter (Signed)
 Complete

## 2023-07-24 ENCOUNTER — Ambulatory Visit (INDEPENDENT_AMBULATORY_CARE_PROVIDER_SITE_OTHER): Payer: MEDICAID | Admitting: Family

## 2023-07-24 ENCOUNTER — Encounter: Payer: Self-pay | Admitting: Family

## 2023-07-24 VITALS — BP 134/86 | HR 74 | Temp 98.5°F | Resp 16 | Ht 70.0 in | Wt 176.2 lb

## 2023-07-24 DIAGNOSIS — Z794 Long term (current) use of insulin: Secondary | ICD-10-CM

## 2023-07-24 DIAGNOSIS — F419 Anxiety disorder, unspecified: Secondary | ICD-10-CM

## 2023-07-24 DIAGNOSIS — R000 Tachycardia, unspecified: Secondary | ICD-10-CM | POA: Diagnosis not present

## 2023-07-24 DIAGNOSIS — I10 Essential (primary) hypertension: Secondary | ICD-10-CM

## 2023-07-24 DIAGNOSIS — K209 Esophagitis, unspecified without bleeding: Secondary | ICD-10-CM

## 2023-07-24 DIAGNOSIS — E119 Type 2 diabetes mellitus without complications: Secondary | ICD-10-CM

## 2023-07-24 DIAGNOSIS — E1165 Type 2 diabetes mellitus with hyperglycemia: Secondary | ICD-10-CM | POA: Diagnosis not present

## 2023-07-24 DIAGNOSIS — I864 Gastric varices: Secondary | ICD-10-CM

## 2023-07-24 DIAGNOSIS — E785 Hyperlipidemia, unspecified: Secondary | ICD-10-CM

## 2023-07-24 DIAGNOSIS — F418 Other specified anxiety disorders: Secondary | ICD-10-CM

## 2023-07-24 DIAGNOSIS — K8689 Other specified diseases of pancreas: Secondary | ICD-10-CM

## 2023-07-24 DIAGNOSIS — K2980 Duodenitis without bleeding: Secondary | ICD-10-CM

## 2023-07-24 DIAGNOSIS — R9389 Abnormal findings on diagnostic imaging of other specified body structures: Secondary | ICD-10-CM

## 2023-07-24 MED ORDER — SERTRALINE HCL 50 MG PO TABS
50.0000 mg | ORAL_TABLET | Freq: Every day | ORAL | 0 refills | Status: DC
Start: 2023-07-24 — End: 2023-10-12

## 2023-07-24 MED ORDER — BUSPIRONE HCL 15 MG PO TABS: 15.0000 mg | ORAL_TABLET | Freq: Two times a day (BID) | ORAL | 0 refills | Status: DC

## 2023-07-24 MED ORDER — SIMVASTATIN 80 MG PO TABS: 80.0000 mg | ORAL_TABLET | Freq: Every day | ORAL | 0 refills | Status: DC

## 2023-07-24 MED ORDER — NOVOLOG FLEXPEN 100 UNIT/ML ~~LOC~~ SOPN: PEN_INJECTOR | SUBCUTANEOUS | 11 refills | Status: AC

## 2023-07-24 MED ORDER — CLONIDINE HCL 0.2 MG PO TABS: 0.2000 mg | ORAL_TABLET | Freq: Three times a day (TID) | ORAL | 0 refills | Status: DC

## 2023-07-24 MED ORDER — METOPROLOL TARTRATE 25 MG PO TABS
25.0000 mg | ORAL_TABLET | Freq: Three times a day (TID) | ORAL | 0 refills | Status: DC
Start: 2023-07-24 — End: 2024-01-09

## 2023-07-24 MED ORDER — LANTUS SOLOSTAR 100 UNIT/ML ~~LOC~~ SOPN: 30.0000 [IU] | PEN_INJECTOR | Freq: Every day | SUBCUTANEOUS | 2 refills | Status: DC

## 2023-07-24 NOTE — Progress Notes (Signed)
 Patient was unable to give me a urine sample for microalbumin he stated he would stop by and give a urine sample

## 2023-07-24 NOTE — Progress Notes (Signed)
 No concerns.

## 2023-07-24 NOTE — Progress Notes (Signed)
 Patient ID: Peter [name], male    DOB: 06-27-1980  MRN: [id number]  CC:  Chronic Conditions Follow-Up  Subjective: Peter [name] is a 43 y.o. male who presents for chronic conditions follow-up.  His concerns today include:  - Doing well on Clonidine  and Metoprolol  Tartrate, no issues/concerns. He does not complain of red flag symptoms such as but not limited to chest pain, shortness of breath, worst headache of life, nausea/vomiting. States he plans to return Cardiology call to schedule an appointment soon. - Doing well on Insulin  Glargine and Insulin  Aspart, no issues/concerns. Denies red flag symptoms associated with diabetes. States he never received call from Endocrinology. - States upcoming diabetic eye exam appointment.  - Due for diabetic foot exam. - Doing well on Simvastatin , no issues/concerns.  - States anxiety depression worsening due to thinking of when his father passed away several years ago and it is also his father's birthday month. Requests referral to therapist. He denies thoughts of self-harm, suicidal ideations, homicidal ideations. - States he never received call from Gastroenterology.    Patient Active Problem List   Diagnosis Date Noted   Anticoagulant long-term use 10/24/2022   HLD (hyperlipidemia) 10/24/2022   Acute thrombosis of splenic vein 09/21/2022   Pancreatic mass 09/13/2022   Hepatitis 09/12/2022   Malignant hypertension 10/29/2020   Uncontrolled type 2 diabetes mellitus with hyperglycemia, without long-term current use of insulin  (HCC) 10/29/2020   Alcohol withdrawal delirium, acute, hyperactive (HCC) 10/29/2020   Type 2 diabetes mellitus, without long-term current use of insulin  (HCC) 07/05/2020   Lactic acidosis 04/04/2020   DKA (diabetic ketoacidosis) (HCC) 04/04/2020   Pancreatitis, acute 04/04/2020   High anion gap metabolic acidosis    Generalized anxiety disorder 01/29/2019   Depression 01/29/2019   MDD (major depressive  disorder), recurrent episode, moderate (HCC) 10/13/2018   Right tibial fracture 01/21/2018   Chest pain 05/20/2017   Anxiety 04/11/2017   Current mild episode of major depressive disorder (HCC) 04/11/2017   Hypomagnesemia 02/15/2017   Abdominal pain 02/14/2017   Acute pancreatitis 02/14/2017   Alcoholic hepatitis without ascites 02/14/2017   Hypokalemia 12/03/2016   Leukocytosis 12/03/2016   Alcohol withdrawal (HCC) 12/02/2016   Alcohol dependence with other alcohol-induced disorder (HCC) 11/18/2016   Methadone  use 11/18/2016   Essential hypertension 11/18/2016   Elevated liver enzymes 11/14/2016     Current Outpatient Medications on File Prior to Visit  Medication Sig Dispense Refill   insulin  glargine (LANTUS ) 100 UNIT/ML injection Inject 0.15 mLs (15 Units total) into the skin daily. (Patient taking differently: Inject 25 Units into the skin daily.) 15 mL 0   Insulin  Pen Needle (RELION PEN NEEDLES) 31G X 6 MM MISC USE 1 IN THE MORNING AND AT BEDTIME WITH  INSULIN   PEN 100 each 11   amLODipine  (NORVASC ) 10 MG tablet Take 1 tablet (10 mg total) by mouth daily. 90 tablet 0   LORazepam  (ATIVAN ) 1 MG tablet Take 1 tablet (1 mg total) by mouth 3 (three) times daily as needed for anxiety. (Patient not taking: Reported on 05/07/2023) 15 tablet 0   metFORMIN  (GLUCOPHAGE ) 1000 MG tablet Take 1 tablet (1,000 mg total) by mouth 2 (two) times daily with a meal. 180 tablet 0   venlafaxine  XR (EFFEXOR -XR) 75 MG 24 hr capsule Take 1 capsule (75 mg total) by mouth daily with breakfast. 30 capsule 0   No current facility-administered medications on file prior to visit.    No Known Allergies  Social History  Socioeconomic History   Marital status: Single    Spouse name: Not on file   Number of children: Not on file   Years of education: Not on file   Highest education level: Not on file  Occupational History   Not on file  Tobacco Use   Smoking status: Every Day    Current packs/day: 0.50     Types: Cigarettes   Smokeless tobacco: Never  Vaping Use   Vaping status: Never Used  Substance and Sexual Activity   Alcohol use: Yes   Drug use: No   Sexual activity: Yes    Birth control/protection: Condom  Other Topics Concern   Not on file  Social History Narrative   Not on file   Social Drivers of Health   Financial Resource Strain: Low Risk  (02/06/2023)   Overall Financial Resource Strain (CARDIA)    Difficulty of Paying Living Expenses: Not very hard  Food Insecurity: Low Risk  (04/15/2023)   Received from [facility]   Hunger Vital Sign    Worried About Running Out of Food in the Last Year: Never true    Ran Out of Food in the Last Year: Never true  Transportation Needs: Not on file (04/15/2023)  Physical Activity: Insufficiently Active (02/06/2023)   Exercise Vital Sign    Days of Exercise per Week: 3 days    Minutes of Exercise per Session: 30 min  Stress: No Stress Concern Present (02/06/2023)   [employer] of Occupational Health - Occupational Stress Questionnaire    Feeling of Stress : Not at all  Social Connections: Socially Isolated (02/06/2023)   Social Connection and Isolation Panel [NHANES]    Frequency of Communication with Friends and Family: Never    Frequency of Social Gatherings with Friends and Family: Never    Attends Religious Services: Never    Database administrator or Organizations: No    Attends Banker Meetings: Never    Marital Status: Living with partner  Intimate Partner Violence: Not At Risk (02/06/2023)   Humiliation, Afraid, Rape, and Kick questionnaire    Fear of Current or Ex-Partner: No    Emotionally Abused: No    Physically Abused: No    Sexually Abused: No    Family History  Problem Relation Age of Onset   Hypertension Father    Stroke Father    CAD Father    Cancer Paternal Grandfather        Lung   Hyperlipidemia Paternal Grandfather    Hypertension Paternal Grandfather     Past Surgical  History:  Procedure Laterality Date   DENTAL SURGERY     ESOPHAGOGASTRODUODENOSCOPY (EGD) WITH PROPOFOL  N/A 09/15/2022   Procedure: ESOPHAGOGASTRODUODENOSCOPY (EGD) WITH PROPOFOL ;  Surgeon: Evangeline Hilts, MD;  Location: [facility];  Service: Gastroenterology;  Laterality: N/A;   EUS Left 09/15/2022   Procedure: UPPER ENDOSCOPIC ULTRASOUND (EUS) LINEAR;  Surgeon: Evangeline Hilts, MD;  Location: [facility];  Service: Gastroenterology;  Laterality: Left;   FINE NEEDLE ASPIRATION N/A 09/15/2022   Procedure: FINE NEEDLE ASPIRATION (FNA) LINEAR;  Surgeon: Evangeline Hilts, MD;  Location: [facility];  Service: Gastroenterology;  Laterality: N/A;   TIBIA IM NAIL INSERTION Right 01/22/2018   Procedure: RIGHT INTRAMEDULLARY (IM) NAIL TIBIAL;  Surgeon: Saundra Curl, MD;  Location: [facility] OR;  Service: Orthopedics;  Laterality: Right;    ROS: Review of Systems Negative except as stated above  PHYSICAL EXAM: BP 134/86   Pulse 74   Temp 98.5 F (36.9  C) (Oral)   Resp 16   Ht 5\' 10"  (1.778 m)   Wt 176 lb 3.2 oz (79.9 kg)   SpO2 96%   BMI 25.28 kg/m   Physical Exam HENT:     Head: Normocephalic and atraumatic.     Nose: Nose normal.     Mouth/Throat:     Mouth: Mucous membranes are moist.     Pharynx: Oropharynx is clear.  Eyes:     Extraocular Movements: Extraocular movements intact.     Conjunctiva/sclera: Conjunctivae normal.     Pupils: Pupils are equal, round, and reactive to light.  Cardiovascular:     Rate and Rhythm: Normal rate and regular rhythm.     Pulses: Normal pulses.     Heart sounds: Normal heart sounds.  Pulmonary:     Effort: Pulmonary effort is normal.     Breath sounds: Normal breath sounds.  Musculoskeletal:        General: Normal range of motion.     Cervical back: Normal range of motion and neck supple.  Neurological:     General: No focal deficit present.     Mental Status: He is alert and oriented to person, place, and time.  Psychiatric:         Mood and Affect: Mood normal.        Behavior: Behavior normal.     ASSESSMENT AND PLAN: 1. Primary hypertension (Primary) 2. Tachycardia - Continue Metoprolol  Tartrate and Clonidine  as prescribed.  - Counseled on blood pressure goal of less than 130/80, low-sodium, DASH diet, medication compliance, and 150 minutes of moderate intensity exercise per week as tolerated. Counseled on medication adherence and adverse effects. - Keep all scheduled appointments with Cardiology.  - Follow-up with primary provider as scheduled. - metoprolol  tartrate (LOPRESSOR ) 25 MG tablet; Take 1 tablet (25 mg total) by mouth in the morning, at noon, and at bedtime.  Dispense: 270 tablet; Refill: 0 - cloNIDine  (CATAPRES ) 0.2 MG tablet; Take 1 tablet (0.2 mg total) by mouth 3 (three) times daily.  Dispense: 270 tablet; Refill: 0  3. Type 2 diabetes mellitus with hyperglycemia, with long-term current use of insulin  (HCC) - Continue Insulin  Glargine and Insulin  Aspart as prescribed.  - Hemoglobin A1c result pending.  - Routine screening.  - Discussed the importance of healthy eating habits, low-carbohydrate diet, low-sugar diet, regular aerobic exercise (at least 150 minutes a week as tolerated) and medication compliance to achieve or maintain control of diabetes. Counseled on medication adherence/adverse effects.  - Referral to Endocrinology for evaluation/management.  - Follow-up with primary provider as scheduled. - insulin  glargine (LANTUS  SOLOSTAR) 100 UNIT/ML Solostar Pen; Inject 30 Units into the skin at bedtime.  Dispense: 15 mL; Refill: 2 - insulin  aspart (NOVOLOG  FLEXPEN) 100 UNIT/ML FlexPen; Units to give if BG < 70: Follow hypoglycemia orders. Inject 0-5 units under the skin 3 (three) times a day before meals. PER SLIDING SCALE Units to give for BG 70-140: 0 units Units to give for BG 141-190: 1 unit Units to give for BG 191-240: 2 units Units to give for BG 241-290: 3 units Units to give for BG 291-340: 4  units Units to give for BG 341-390: 5 units Units to give for BG > 390 6 units BG > 391 call PCP.  Dispense: 15 mL; Refill: 11 - Hemoglobin A1c - Ambulatory referral to Endocrinology - Microalbumin / creatinine urine ratio  4. Encounter for diabetic foot exam [facility]) - Referral to Podiatry for evaluation/management. - Ambulatory  referral to Podiatry  5. Hyperlipidemia, unspecified hyperlipidemia type - Continue Simvastatin  as prescribed. Counseled on medication adherence/adverse effects.  - Routine screening.  - Follow-up with primary provider as scheduled. - simvastatin  (ZOCOR ) 80 MG tablet; Take 1 tablet (80 mg total) by mouth at bedtime.  Dispense: 90 tablet; Refill: 0 - Lipid panel  6. Anxiety and depression - Patient denies thoughts of self-harm, suicidal ideations, homicidal ideations. - Increase Sertraline  from 25 mg to 50 mg as prescribed.  - Increase Buspirone  from 10 mg twice daily to 15 mg twice daily as prescribed. Counseled on medication adherence/adverse effects.  - Referral to Psychiatry for evaluation/management. - Follow-up with primary provider as scheduled. - sertraline  (ZOLOFT ) 50 MG tablet; Take 1 tablet (50 mg total) by mouth daily.  Dispense: 90 tablet; Refill: 0 - busPIRone  (BUSPAR ) 15 MG tablet; Take 1 tablet (15 mg total) by mouth 2 (two) times daily.  Dispense: 180 tablet; Refill: 0 - Ambulatory referral to Psychiatry  7. Duodenitis 8. Esophagitis 9. 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 Abnormal CT scan - Referral to Gastroenterology for evaluation/management. - Ambulatory referral to Gastroenterology    Patient was given the opportunity to ask questions.  Patient verbalized understanding of the plan and was able to repeat key elements of the plan. Patient was given clear instructions to go to Emergency Department or return to medical center if symptoms don't improve, worsen, or new problems develop.The patient verbalized understanding.   Orders Placed  This Encounter  Procedures   Hemoglobin A1c   Lipid panel   Microalbumin / creatinine urine ratio   Ambulatory referral to Psychiatry   Ambulatory referral to Gastroenterology   Ambulatory referral to Endocrinology   Ambulatory referral to Podiatry     Requested Prescriptions   Signed Prescriptions Disp Refills   metoprolol  tartrate (LOPRESSOR ) 25 MG tablet 270 tablet 0    Sig: Take 1 tablet (25 mg total) by mouth in the morning, at noon, and at bedtime.   cloNIDine  (CATAPRES ) 0.2 MG tablet 270 tablet 0    Sig: Take 1 tablet (0.2 mg total) by mouth 3 (three) times daily.   insulin  glargine (LANTUS  SOLOSTAR) 100 UNIT/ML Solostar Pen 15 mL 2    Sig: Inject 30 Units into the skin at bedtime.   insulin  aspart (NOVOLOG  FLEXPEN) 100 UNIT/ML FlexPen 15 mL 11    Sig: Units to give if BG < 70: Follow hypoglycemia orders. Inject 0-5 units under the skin 3 (three) times a day before meals. PER SLIDING SCALE Units to give for BG 70-140: 0 units Units to give for BG 141-190: 1 unit Units to give for BG 191-240: 2 units Units to give for BG 241-290: 3 units Units to give for BG 291-340: 4 units Units to give for BG 341-390: 5 units Units to give for BG > 390 6 units BG > 391 call PCP.   simvastatin  (ZOCOR ) 80 MG tablet 90 tablet 0    Sig: Take 1 tablet (80 mg total) by mouth at bedtime.   sertraline  (ZOLOFT ) 50 MG tablet 90 tablet 0    Sig: Take 1 tablet (50 mg total) by mouth daily.   busPIRone  (BUSPAR ) 15 MG tablet 180 tablet 0    Sig: Take 1 tablet (15 mg total) by mouth 2 (two) times daily.    Follow-up with primary provider as scheduled.  Senaida Dama, NP

## 2023-07-25 ENCOUNTER — Encounter: Payer: Self-pay | Admitting: Family

## 2023-07-25 ENCOUNTER — Other Ambulatory Visit: Payer: Self-pay

## 2023-07-25 ENCOUNTER — Ambulatory Visit: Payer: Self-pay | Admitting: Family

## 2023-07-25 DIAGNOSIS — E1165 Type 2 diabetes mellitus with hyperglycemia: Secondary | ICD-10-CM

## 2023-07-25 LAB — LIPID PANEL
Chol/HDL Ratio: 4.7 ratio (ref 0.0–5.0)
Cholesterol, Total: 192 mg/dL (ref 100–199)
HDL: 41 mg/dL (ref >39)
LDL Chol Calc (NIH): 92 mg/dL (ref 0–99)
Triglycerides: 355 mg/dL — ABNORMAL HIGH (ref 0–149)
VLDL Cholesterol Cal: 59 mg/dL — ABNORMAL HIGH (ref 5–40)

## 2023-07-25 LAB — HEMOGLOBIN A1C
Est. average glucose Bld gHb Est-mCnc: 258 mg/dL
Hgb A1c MFr Bld: 10.6 % — ABNORMAL HIGH (ref 4.8–5.6)

## 2023-07-25 MED ORDER — LANTUS SOLOSTAR 100 UNIT/ML ~~LOC~~ SOPN: 35.0000 [IU] | PEN_INJECTOR | Freq: Every day | SUBCUTANEOUS | 2 refills | Status: AC

## 2023-08-25 ENCOUNTER — Other Ambulatory Visit: Payer: Self-pay | Admitting: Family

## 2023-08-25 DIAGNOSIS — Z794 Long term (current) use of insulin: Secondary | ICD-10-CM

## 2023-08-27 NOTE — Telephone Encounter (Signed)
 Complete

## 2023-10-12 ENCOUNTER — Other Ambulatory Visit: Payer: Self-pay | Admitting: Family

## 2023-10-12 DIAGNOSIS — F419 Anxiety disorder, unspecified: Secondary | ICD-10-CM

## 2023-10-12 NOTE — Telephone Encounter (Signed)
 Complete

## 2023-10-24 ENCOUNTER — Encounter: Payer: Self-pay | Admitting: Family

## 2023-10-24 NOTE — Progress Notes (Signed)
 Erroneous encounter-disregard

## 2024-01-09 ENCOUNTER — Other Ambulatory Visit: Payer: Self-pay | Admitting: Family

## 2024-01-09 DIAGNOSIS — R000 Tachycardia, unspecified: Secondary | ICD-10-CM

## 2024-01-09 DIAGNOSIS — I10 Essential (primary) hypertension: Secondary | ICD-10-CM

## 2024-01-09 DIAGNOSIS — E785 Hyperlipidemia, unspecified: Secondary | ICD-10-CM

## 2024-01-09 DIAGNOSIS — F419 Anxiety disorder, unspecified: Secondary | ICD-10-CM

## 2024-01-09 NOTE — Telephone Encounter (Signed)
 Complete

## 2024-01-14 ENCOUNTER — Other Ambulatory Visit: Payer: Self-pay | Admitting: Family

## 2024-01-14 DIAGNOSIS — F419 Anxiety disorder, unspecified: Secondary | ICD-10-CM

## 2024-01-14 NOTE — Telephone Encounter (Signed)
 Complete

## 2024-02-25 ENCOUNTER — Ambulatory Visit (INDEPENDENT_AMBULATORY_CARE_PROVIDER_SITE_OTHER): Payer: MEDICAID | Admitting: Family

## 2024-02-25 VITALS — BP 130/93 | HR 108 | Temp 99.2°F | Resp 16 | Ht 70.0 in | Wt 176.8 lb

## 2024-02-25 DIAGNOSIS — I864 Gastric varices: Secondary | ICD-10-CM | POA: Diagnosis not present

## 2024-02-25 DIAGNOSIS — R9389 Abnormal findings on diagnostic imaging of other specified body structures: Secondary | ICD-10-CM | POA: Diagnosis not present

## 2024-02-25 DIAGNOSIS — R000 Tachycardia, unspecified: Secondary | ICD-10-CM

## 2024-02-25 DIAGNOSIS — F32A Depression, unspecified: Secondary | ICD-10-CM | POA: Diagnosis not present

## 2024-02-25 DIAGNOSIS — K8689 Other specified diseases of pancreas: Secondary | ICD-10-CM

## 2024-02-25 DIAGNOSIS — F419 Anxiety disorder, unspecified: Secondary | ICD-10-CM

## 2024-02-25 DIAGNOSIS — K2980 Duodenitis without bleeding: Secondary | ICD-10-CM | POA: Diagnosis not present

## 2024-02-25 DIAGNOSIS — K209 Esophagitis, unspecified without bleeding: Secondary | ICD-10-CM

## 2024-02-25 DIAGNOSIS — E785 Hyperlipidemia, unspecified: Secondary | ICD-10-CM | POA: Diagnosis not present

## 2024-02-25 DIAGNOSIS — I10 Essential (primary) hypertension: Secondary | ICD-10-CM

## 2024-02-25 MED ORDER — BUSPIRONE HCL 15 MG PO TABS: 15.0000 mg | ORAL_TABLET | Freq: Two times a day (BID) | ORAL | 0 refills | Status: AC

## 2024-02-25 MED ORDER — CLONIDINE HCL 0.2 MG PO TABS: 0.2000 mg | ORAL_TABLET | Freq: Three times a day (TID) | ORAL | 0 refills | Status: AC

## 2024-02-25 MED ORDER — SERTRALINE HCL 50 MG PO TABS: 50.0000 mg | ORAL_TABLET | Freq: Every day | ORAL | 0 refills | Status: AC

## 2024-02-25 MED ORDER — SIMVASTATIN 80 MG PO TABS: 80.0000 mg | ORAL_TABLET | Freq: Every day | ORAL | 0 refills | Status: AC

## 2024-02-25 MED ORDER — METOPROLOL TARTRATE 25 MG PO TABS: 25.0000 mg | ORAL_TABLET | Freq: Four times a day (QID) | ORAL | 0 refills | Status: AC

## 2024-02-25 NOTE — Progress Notes (Signed)
 "   Patient ID: Peter [name], male    DOB: [birth year]  MRN: [id number]  CC: Chronic Conditions Follow-Up  Subjective: Peter [name] is a 44 y.o. male who presents for chronic conditions follow-up.  His concerns today include:  - Doing well on Metoprolol  Tartrate and Clonidine , no issues/concerns. He does not complain of red flag symptoms such as but not limited to chest pain, shortness of breath, worst headache of life, nausea/vomiting. States since previous office visit he did not establish with Cardiology.  - Doing well on Simvastatin , no issues/concerns.  - Doing well on Sertraline  and Buspirone , no issues/concerns. He denies thoughts of self-harm, suicidal ideations, homicidal ideations. - States since previous office visit he did not establish with Gastroenterology.   Patient Active Problem List   Diagnosis Date Noted   Anticoagulant long-term use 10/24/2022   HLD (hyperlipidemia) 10/24/2022   Acute thrombosis of splenic vein 09/21/2022   Pancreatic mass 09/13/2022   Hepatitis 09/12/2022   Malignant hypertension 10/29/2020   Uncontrolled type 2 diabetes mellitus with hyperglycemia, without long-term current use of insulin  (HCC) 10/29/2020   Alcohol withdrawal delirium, acute, hyperactive (HCC) 10/29/2020   Type 2 diabetes mellitus, without long-term current use of insulin  (HCC) 07/05/2020   Lactic acidosis 04/04/2020   DKA (diabetic ketoacidosis) (HCC) 04/04/2020   Pancreatitis, acute 04/04/2020   High anion gap metabolic acidosis    Generalized anxiety disorder 01/29/2019   Depression 01/29/2019   MDD (major depressive disorder), recurrent episode, moderate (HCC) 10/13/2018   Right tibial fracture 01/21/2018   Chest pain 05/20/2017   Anxiety 04/11/2017   Current mild episode of major depressive disorder 04/11/2017   Hypomagnesemia 02/15/2017   Abdominal pain 02/14/2017   Acute pancreatitis 02/14/2017   Alcoholic hepatitis without ascites (HCC) 02/14/2017    Hypokalemia 12/03/2016   Leukocytosis 12/03/2016   Alcohol withdrawal (HCC) 12/02/2016   Alcohol dependence with other alcohol-induced disorder (HCC) 11/18/2016   Methadone  use 11/18/2016   Essential hypertension 11/18/2016   Elevated liver enzymes 11/14/2016     Medications Ordered Prior to Encounter[1]  Allergies[2]  Social History   Socioeconomic History   Marital status: Single    Spouse name: Not on file   Number of children: Not on file   Years of education: Not on file   Highest education level: 12th grade  Occupational History   Not on file  Tobacco Use   Smoking status: Every Day    Current packs/day: 0.50    Types: Cigarettes   Smokeless tobacco: Never  Vaping Use   Vaping status: Never Used  Substance and Sexual Activity   Alcohol use: Yes   Drug use: No   Sexual activity: Yes    Birth control/protection: Condom  Other Topics Concern   Not on file  Social History Narrative   Not on file   Social Drivers of Health   Tobacco Use: High Risk (02/25/2024)   Patient History    Smoking Tobacco Use: Every Day    Smokeless Tobacco Use: Never    Passive Exposure: Not on file  Financial Resource Strain: Low Risk (02/25/2024)   Overall Financial Resource Strain (CARDIA)    Difficulty of Paying Living Expenses: Not hard at all  Food Insecurity: No Food Insecurity (02/25/2024)   Epic    Worried About Radiation Protection Practitioner of Food in the Last Year: Never true    Ran Out of Food in the Last Year: Never true  Transportation Needs: No Transportation Needs (02/25/2024)   Epic  Lack of Transportation (Medical): No    Lack of Transportation (Non-Medical): No  Physical Activity: Insufficiently Active (02/06/2023)   Exercise Vital Sign    Days of Exercise per Week: 3 days    Minutes of Exercise per Session: 30 min  Stress: No Stress Concern Present (02/06/2023)   [employer] of Occupational Health - Occupational Stress Questionnaire    Feeling of Stress : Not at all  Social  Connections: Moderately Isolated (02/25/2024)   Social Connection and Isolation Panel    Frequency of Communication with Friends and Family: More than three times a week    Frequency of Social Gatherings with Friends and Family: Three times a week    Attends Religious Services: More than 4 times per year    Active Member of Clubs or Organizations: No    Attends Banker Meetings: Not on file    Marital Status: Never married  Intimate Partner Violence: Not At Risk (02/06/2023)   Humiliation, Afraid, Rape, and Kick questionnaire    Fear of Current or Ex-Partner: No    Emotionally Abused: No    Physically Abused: No    Sexually Abused: No  Depression (PHQ2-9): Low Risk (02/25/2024)   Depression (PHQ2-9)    PHQ-2 Score: 0  Alcohol Screen: Low Risk (02/06/2023)   Alcohol Screen    Last Alcohol Screening Score (AUDIT): 0  Housing: Unknown (02/25/2024)   Epic    Unable to Pay for Housing in the Last Year: No    Number of Times Moved in the Last Year: Not on file    Homeless in the Last Year: No  Utilities: Low Risk (04/15/2023)   Received from [facility]   Utilities    In the past 12 months has the electric, gas, oil, or water company threatened to shut off services in your home? : No  Health Literacy: Adequate Health Literacy (02/06/2023)   B1300 Health Literacy    Frequency of need for help with medical instructions: Never    Family History  Problem Relation Age of Onset   Hypertension Father    Stroke Father    CAD Father    Cancer Paternal Grandfather        Lung   Hyperlipidemia Paternal Grandfather    Hypertension Paternal Grandfather     Past Surgical History:  Procedure Laterality Date   DENTAL SURGERY     ESOPHAGOGASTRODUODENOSCOPY (EGD) WITH PROPOFOL  N/A 09/15/2022   Procedure: ESOPHAGOGASTRODUODENOSCOPY (EGD) WITH PROPOFOL ;  Surgeon: Burnette Fallow, MD;  Location: [facility];  Service: Gastroenterology;  Laterality: N/A;   EUS Left 09/15/2022    Procedure: UPPER ENDOSCOPIC ULTRASOUND (EUS) LINEAR;  Surgeon: Burnette Fallow, MD;  Location: [facility];  Service: Gastroenterology;  Laterality: Left;   FINE NEEDLE ASPIRATION N/A 09/15/2022   Procedure: FINE NEEDLE ASPIRATION (FNA) LINEAR;  Surgeon: Burnette Fallow, MD;  Location: [facility];  Service: Gastroenterology;  Laterality: N/A;   TIBIA IM NAIL INSERTION Right 01/22/2018   Procedure: RIGHT INTRAMEDULLARY (IM) NAIL TIBIAL;  Surgeon: Beverley Evalene BIRCH, MD;  Location: [facility];  Service: Orthopedics;  Laterality: Right;    ROS: Review of Systems Negative except as stated above  PHYSICAL EXAM: BP (!) 130/93   Pulse (!) 108   Temp 99.2 F (37.3 C) (Oral)   Resp 16   Ht 5' 10 (1.778 m)   Wt 176 lb 12.8 oz (80.2 kg)   SpO2 95%   BMI 25.37 kg/m   Physical Exam HENT:     Head:  Normocephalic and atraumatic.     Nose: Nose normal.     Mouth/Throat:     Mouth: Mucous membranes are moist.     Pharynx: Oropharynx is clear.  Eyes:     Extraocular Movements: Extraocular movements intact.     Conjunctiva/sclera: Conjunctivae normal.     Pupils: Pupils are equal, round, and reactive to light.  Cardiovascular:     Rate and Rhythm: Tachycardia present.     Pulses: Normal pulses.     Heart sounds: Normal heart sounds.  Pulmonary:     Effort: Pulmonary effort is normal.     Breath sounds: Normal breath sounds.  Musculoskeletal:        General: Normal range of motion.     Cervical back: Normal range of motion and neck supple.  Neurological:     General: No focal deficit present.     Mental Status: He is alert and oriented to person, place, and time.  Psychiatric:        Mood and Affect: Mood normal.        Behavior: Behavior normal.    ASSESSMENT AND PLAN: 1. Primary hypertension (Primary) 2. Tachycardia - Blood pressure not at goal during today's visit. Patient asymptomatic without chest pressure, chest pain, palpitations, shortness of breath, worst headache of life, and  any additional red flag symptoms. - Increase Metoprolol  Tartrate from three times daily to four times daily as prescribed.  - Continue Clonidine  as prescribed.  - Routine screening.  - Counseled on blood pressure goal of less than 130/80, low-sodium, DASH diet, medication compliance, and 150 minutes of moderate intensity exercise per week as tolerated. Counseled on medication adherence and adverse effects. - Referral to Cardiology for evaluation/management.  - Follow-up with primary provider in 4 weeks or sooner if needed. - cloNIDine  (CATAPRES ) 0.2 MG tablet; Take 1 tablet (0.2 mg total) by mouth 3 (three) times daily.  Dispense: 270 tablet; Refill: 0 - Ambulatory referral to Cardiology - Basic Metabolic Panel - metoprolol  tartrate (LOPRESSOR ) 25 MG tablet; Take 1 tablet (25 mg total) by mouth in the morning, at noon, in the evening, and at bedtime.  Dispense: 180 tablet; Refill: 0  3. Hyperlipidemia, unspecified hyperlipidemia type - Continue Simvastatin  as prescribed. Counseled on medication adherence/adverse effects.  - Routine screening.  - Follow-up with primary provider as scheduled.  - simvastatin  (ZOCOR ) 80 MG tablet; Take 1 tablet (80 mg total) by mouth daily.  Dispense: 45 tablet; Refill: 0 - Lipid panel; Future - Lipid panel  4. Anxiety and depression - Patient denies thoughts of self-harm, suicidal ideations, homicidal ideations. - Continue Sertraline  and Buspirone  as prescribed. Counseled on medication adherence/adverse effects.  - Follow-up with primary provider in 3 months or sooner if needed. - sertraline  (ZOLOFT ) 50 MG tablet; Take 1 tablet (50 mg total) by mouth daily.  Dispense: 90 tablet; Refill: 0 - busPIRone  (BUSPAR ) 15 MG tablet; Take 1 tablet (15 mg total) by mouth 2 (two) times daily.  Dispense: 180 tablet; Refill: 0  5. Duodenitis 6. Esophagitis 7. Pancreatic mass 8. Gastric varix 9. Abnormal CT scan - Referral to Gastroenterology for evaluation/management.   - Ambulatory referral to Gastroenterology  Patient was given the opportunity to ask questions.  Patient verbalized understanding of the plan and was able to repeat key elements of the plan. Patient was given clear instructions to go to Emergency Department or return to medical center if symptoms don't improve, worsen, or new problems develop.The patient verbalized understanding.   Orders Placed  This Encounter  Procedures   Lipid panel   Basic Metabolic Panel   Ambulatory referral to Cardiology   Ambulatory referral to Gastroenterology     Requested Prescriptions   Signed Prescriptions Disp Refills   cloNIDine  (CATAPRES ) 0.2 MG tablet 270 tablet 0    Sig: Take 1 tablet (0.2 mg total) by mouth 3 (three) times daily.   simvastatin  (ZOCOR ) 80 MG tablet 45 tablet 0    Sig: Take 1 tablet (80 mg total) by mouth daily.   sertraline  (ZOLOFT ) 50 MG tablet 90 tablet 0    Sig: Take 1 tablet (50 mg total) by mouth daily.   busPIRone  (BUSPAR ) 15 MG tablet 180 tablet 0    Sig: Take 1 tablet (15 mg total) by mouth 2 (two) times daily.   metoprolol  tartrate (LOPRESSOR ) 25 MG tablet 180 tablet 0    Sig: Take 1 tablet (25 mg total) by mouth in the morning, at noon, in the evening, and at bedtime.    Return in about 4 weeks (around 03/24/2024) for Follow-Up or next available chronic conditions.  Greig JINNY Chute, NP      [1]  Current Outpatient Medications on File Prior to Visit  Medication Sig Dispense Refill   amLODipine  (NORVASC ) 10 MG tablet Take 1 tablet (10 mg total) by mouth daily. 90 tablet 0   insulin  aspart (NOVOLOG  FLEXPEN) 100 UNIT/ML FlexPen Units to give if BG < 70: Follow hypoglycemia orders. Inject 0-5 units under the skin 3 (three) times a day before meals. PER SLIDING SCALE Units to give for BG 70-140: 0 units Units to give for BG 141-190: 1 unit Units to give for BG 191-240: 2 units Units to give for BG 241-290: 3 units Units to give for BG 291-340: 4 units Units to give for BG  341-390: 5 units Units to give for BG > 390 6 units BG > 391 call PCP. 15 mL 11   insulin  glargine (LANTUS  SOLOSTAR) 100 UNIT/ML Solostar Pen Inject 35 Units into the skin at bedtime. 15 mL 2   insulin  glargine-yfgn (SEMGLEE ) 100 UNIT/ML Pen INJECT 15 UNITS SUBCUTANEOUSLY ONCE DAILY 15 mL 0   Insulin  Pen Needle (RELION PEN NEEDLES) 31G X 6 MM MISC USE 1 IN THE MORNING AND AT BEDTIME WITH  INSULIN   PEN 100 each 11   metFORMIN  (GLUCOPHAGE ) 1000 MG tablet Take 1 tablet (1,000 mg total) by mouth 2 (two) times daily with a meal. 180 tablet 0   metoprolol  tartrate (LOPRESSOR ) 25 MG tablet TAKE 1 TABLET BY MOUTH IN THE MORNING, AT NOON, AND AT BEDTIME 270 tablet 0   venlafaxine  XR (EFFEXOR -XR) 75 MG 24 hr capsule Take 1 capsule (75 mg total) by mouth daily with breakfast. 30 capsule 0   LORazepam  (ATIVAN ) 1 MG tablet Take 1 tablet (1 mg total) by mouth 3 (three) times daily as needed for anxiety. (Patient not taking: Reported on 05/07/2023) 15 tablet 0   No current facility-administered medications on file prior to visit.  [2] No Known Allergies  "

## 2024-02-26 DIAGNOSIS — E785 Hyperlipidemia, unspecified: Secondary | ICD-10-CM

## 2024-02-26 LAB — BASIC METABOLIC PANEL WITH GFR
BUN/Creatinine Ratio: 19 (ref 9–20)
BUN: 19 mg/dL (ref 6–24)
CO2: 24 mmol/L (ref 20–29)
Calcium: 9.8 mg/dL (ref 8.7–10.2)
Chloride: 97 mmol/L (ref 96–106)
Creatinine, Ser: 1.02 mg/dL (ref 0.76–1.27)
Glucose: 170 mg/dL — ABNORMAL HIGH (ref 70–99)
Potassium: 4.6 mmol/L (ref 3.5–5.2)
Sodium: 137 mmol/L (ref 134–144)
eGFR: 94 mL/min/1.73

## 2024-02-26 LAB — LIPID PANEL
Chol/HDL Ratio: 3.4 ratio (ref 0.0–5.0)
Cholesterol, Total: 114 mg/dL (ref 100–199)
HDL: 34 mg/dL — ABNORMAL LOW
LDL Chol Calc (NIH): 50 mg/dL (ref 0–99)
Triglycerides: 177 mg/dL — ABNORMAL HIGH (ref 0–149)
VLDL Cholesterol Cal: 30 mg/dL (ref 5–40)

## 2024-03-26 ENCOUNTER — Encounter: Payer: Self-pay | Admitting: Family

## 2024-03-26 ENCOUNTER — Telehealth: Payer: Self-pay | Admitting: Family

## 2024-03-26 NOTE — Progress Notes (Signed)
 Erroneous encounter-disregard

## 2024-03-26 NOTE — Telephone Encounter (Signed)
 I called patient about missed appt on 2/4. Left voicemail
